# Patient Record
Sex: Male | Born: 1950 | Race: White | Hispanic: No | Marital: Married | Smoking: Never smoker
Health system: Southern US, Community
[De-identification: ages and names within clinical notes are randomized; demographics above are authoritative.]

## PROBLEM LIST (undated history)

## (undated) DIAGNOSIS — K9041 Non-celiac gluten sensitivity: Secondary | ICD-10-CM

## (undated) DIAGNOSIS — Z87820 Personal history of traumatic brain injury: Secondary | ICD-10-CM

## (undated) DIAGNOSIS — K5790 Diverticulosis of intestine, part unspecified, without perforation or abscess without bleeding: Secondary | ICD-10-CM

## (undated) DIAGNOSIS — Z9989 Dependence on other enabling machines and devices: Secondary | ICD-10-CM

## (undated) DIAGNOSIS — I7121 Aneurysm of the ascending aorta, without rupture: Secondary | ICD-10-CM

## (undated) DIAGNOSIS — I77810 Thoracic aortic ectasia: Secondary | ICD-10-CM

## (undated) DIAGNOSIS — Z973 Presence of spectacles and contact lenses: Secondary | ICD-10-CM

## (undated) DIAGNOSIS — G8929 Other chronic pain: Secondary | ICD-10-CM

## (undated) DIAGNOSIS — R200 Anesthesia of skin: Secondary | ICD-10-CM

## (undated) DIAGNOSIS — Q8740 Marfan's syndrome, unspecified: Secondary | ICD-10-CM

## (undated) DIAGNOSIS — K219 Gastro-esophageal reflux disease without esophagitis: Secondary | ICD-10-CM

## (undated) DIAGNOSIS — M19039 Primary osteoarthritis, unspecified wrist: Secondary | ICD-10-CM

## (undated) DIAGNOSIS — R7611 Nonspecific reaction to tuberculin skin test without active tuberculosis: Secondary | ICD-10-CM

## (undated) DIAGNOSIS — Z8701 Personal history of pneumonia (recurrent): Secondary | ICD-10-CM

## (undated) DIAGNOSIS — M25531 Pain in right wrist: Secondary | ICD-10-CM

## (undated) DIAGNOSIS — Z8279 Family history of other congenital malformations, deformations and chromosomal abnormalities: Secondary | ICD-10-CM

## (undated) DIAGNOSIS — G629 Polyneuropathy, unspecified: Secondary | ICD-10-CM

## (undated) DIAGNOSIS — Z889 Allergy status to unspecified drugs, medicaments and biological substances status: Secondary | ICD-10-CM

## (undated) DIAGNOSIS — K449 Diaphragmatic hernia without obstruction or gangrene: Secondary | ICD-10-CM

## (undated) DIAGNOSIS — M5410 Radiculopathy, site unspecified: Secondary | ICD-10-CM

## (undated) DIAGNOSIS — I714 Abdominal aortic aneurysm, without rupture: Secondary | ICD-10-CM

## (undated) DIAGNOSIS — G4730 Sleep apnea, unspecified: Secondary | ICD-10-CM

## (undated) DIAGNOSIS — G510 Bell's palsy: Secondary | ICD-10-CM

## (undated) DIAGNOSIS — J3089 Other allergic rhinitis: Secondary | ICD-10-CM

## (undated) DIAGNOSIS — R202 Paresthesia of skin: Secondary | ICD-10-CM

## (undated) DIAGNOSIS — I959 Hypotension, unspecified: Secondary | ICD-10-CM

## (undated) DIAGNOSIS — B009 Herpesviral infection, unspecified: Secondary | ICD-10-CM

## (undated) DIAGNOSIS — N529 Male erectile dysfunction, unspecified: Secondary | ICD-10-CM

## (undated) DIAGNOSIS — Z9289 Personal history of other medical treatment: Secondary | ICD-10-CM

## (undated) DIAGNOSIS — N401 Enlarged prostate with lower urinary tract symptoms: Secondary | ICD-10-CM

## (undated) DIAGNOSIS — H21239 Degeneration of iris (pigmentary), unspecified eye: Secondary | ICD-10-CM

## (undated) DIAGNOSIS — R2991 Unspecified symptoms and signs involving the musculoskeletal system: Secondary | ICD-10-CM

## (undated) DIAGNOSIS — R918 Other nonspecific abnormal finding of lung field: Secondary | ICD-10-CM

## (undated) DIAGNOSIS — I2510 Atherosclerotic heart disease of native coronary artery without angina pectoris: Secondary | ICD-10-CM

## (undated) DIAGNOSIS — G4733 Obstructive sleep apnea (adult) (pediatric): Secondary | ICD-10-CM

## (undated) DIAGNOSIS — I7789 Other specified disorders of arteries and arterioles: Secondary | ICD-10-CM

## (undated) DIAGNOSIS — E7800 Pure hypercholesterolemia, unspecified: Secondary | ICD-10-CM

## (undated) DIAGNOSIS — I712 Thoracic aortic aneurysm, without rupture: Secondary | ICD-10-CM

## (undated) DIAGNOSIS — N3943 Post-void dribbling: Secondary | ICD-10-CM

## (undated) DIAGNOSIS — F329 Major depressive disorder, single episode, unspecified: Secondary | ICD-10-CM

## (undated) HISTORY — DX: Male erectile dysfunction, unspecified: N52.9

## (undated) HISTORY — DX: Hypotension, unspecified: I95.9

## (undated) HISTORY — DX: Gastro-esophageal reflux disease without esophagitis: K21.9

## (undated) HISTORY — DX: Polyneuropathy, unspecified: G62.9

## (undated) HISTORY — DX: Major depressive disorder, single episode, unspecified: F32.9

## (undated) HISTORY — DX: Thoracic aortic aneurysm, without rupture: I71.2

## (undated) HISTORY — DX: Marfan's syndrome, unspecified: Q87.40

## (undated) HISTORY — DX: Nonspecific reaction to tuberculin skin test without active tuberculosis: R76.11

## (undated) HISTORY — DX: Non-celiac gluten sensitivity: K90.41

## (undated) HISTORY — DX: Other specified disorders of arteries and arterioles: I77.89

## (undated) HISTORY — DX: Diverticulosis of intestine, part unspecified, without perforation or abscess without bleeding: K57.90

## (undated) HISTORY — DX: Obstructive sleep apnea (adult) (pediatric): G47.33

## (undated) HISTORY — DX: Benign prostatic hyperplasia with lower urinary tract symptoms: N40.1

## (undated) HISTORY — DX: Aneurysm of the ascending aorta, without rupture: I71.21

## (undated) HISTORY — DX: Primary osteoarthritis, unspecified wrist: M19.039

## (undated) HISTORY — DX: Degeneration of iris (pigmentary), unspecified eye: H21.239

## (undated) HISTORY — DX: Obstructive sleep apnea (adult) (pediatric): Z99.89

## (undated) HISTORY — DX: Herpesviral infection, unspecified: B00.9

## (undated) HISTORY — PX: OTHER SURGICAL HISTORY: SHX169

## (undated) HISTORY — DX: Radiculopathy, site unspecified: M54.10

## (undated) HISTORY — DX: Sleep apnea, unspecified: G47.30

## (undated) HISTORY — DX: Allergy status to unspecified drugs, medicaments and biological substances status: Z88.9

## (undated) HISTORY — DX: Pure hypercholesterolemia, unspecified: E78.00

## (undated) HISTORY — DX: Personal history of pneumonia (recurrent): Z87.01

## (undated) HISTORY — DX: Other nonspecific abnormal finding of lung field: R91.8

## (undated) HISTORY — DX: Bell's palsy: G51.0

---
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
## 1973-12-25 HISTORY — PX: NASAL SEPTUM SURGERY: SHX37

## 1973-12-25 HISTORY — PX: WISDOM TOOTH EXTRACTION: SHX21

## 1976-12-25 HISTORY — PX: INGUINAL HERNIA REPAIR: SUR1180

## 1993-12-25 HISTORY — PX: FINGER EXPLORATION: SHX1635

## 1993-12-25 HISTORY — PX: DIGITAL NERVE REPAIR: SHX1458

## 2008-12-25 HISTORY — PX: APPENDECTOMY: SHX54

## 2008-12-25 HISTORY — PX: LAPAROSCOPIC APPENDECTOMY: SUR753

## 2011-10-03 ENCOUNTER — Emergency Department (HOSPITAL_COMMUNITY)
Admission: EM | Admit: 2011-10-03 | Discharge: 2011-10-04 | Disposition: A | Discharge status: Home / Self Care | Payer: 59 | Attending: Emergency Medicine | Admitting: Emergency Medicine

## 2011-10-03 DIAGNOSIS — B379 Candidiasis, unspecified: Secondary | ICD-10-CM | POA: Insufficient documentation

## 2011-10-03 DIAGNOSIS — R21 Rash and other nonspecific skin eruption: Secondary | ICD-10-CM | POA: Insufficient documentation

## 2011-10-04 LAB — GLUCOSE, CAPILLARY: Glucose-Capillary: 95 mg/dL (ref 70–99)

## 2011-11-21 ENCOUNTER — Ambulatory Visit (INDEPENDENT_AMBULATORY_CARE_PROVIDER_SITE_OTHER): Payer: 59 | Admitting: Internal Medicine

## 2011-11-21 ENCOUNTER — Encounter: Payer: Self-pay | Admitting: Internal Medicine

## 2011-11-21 DIAGNOSIS — R918 Other nonspecific abnormal finding of lung field: Secondary | ICD-10-CM

## 2011-11-21 DIAGNOSIS — J305 Allergic rhinitis due to food: Secondary | ICD-10-CM

## 2011-11-21 DIAGNOSIS — R9389 Abnormal findings on diagnostic imaging of other specified body structures: Secondary | ICD-10-CM

## 2011-11-21 DIAGNOSIS — R7611 Nonspecific reaction to tuberculin skin test without active tuberculosis: Secondary | ICD-10-CM

## 2011-11-21 DIAGNOSIS — J301 Allergic rhinitis due to pollen: Secondary | ICD-10-CM

## 2011-11-21 DIAGNOSIS — G4733 Obstructive sleep apnea (adult) (pediatric): Secondary | ICD-10-CM

## 2011-11-21 NOTE — Progress Notes (Signed)
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 He reports a chronic history of allergic rhinitis, with allergy skin testing broadly positive in the past but never allergy vaccine. He moved here from [place] this year. There, he was adequately controlled with a steroid nasal spray, Mucinex and Zyrtec D. Symptoms worse in spring and fall. In [place] he is needing to have plain Zyrtec as an extra dose in the evenings. He wants to avoid allergy shots. He notices primarily rhinorrhea with less congestion and no wheeze or cough and no rash. There is no history of intolerance to latex, contrast, aspirin. Food allergies have been suspected, especially seasonings and cotton seed oil.Marland Kitchen He has not had insect stings. He has felt worse on work days and he says there has been obvious black mold in his building. There is a history of major renovation of abdomen because of mold and dampness problems. He is currently living in an apartment with some carpet and mostly vinyl floor. He denies problems there. He has no pets but did have a cath in the past. He reports intense local reaction to flu vaccine on more than one trial in the past. History of obstructive sleep apnea treated with BiPAP 11/6. We asked him to track down his original diagnostic report for our file. He never smoked, but chest x-ray in the last year showed overinflation and was interpreted as "COPD". He denies respiratory symptoms except as described above and wondered if the BiPAP caused overinflation. He is interested in evaluating with PFT.  ROS-see HPI Constitutional:   No-   weight loss, night sweats, fevers, chills, fatigue, lassitude. HEENT:   No-  headaches, difficulty swallowing, tooth/dental problems, sore throat,       No-  sneezing, itching, ear ache, nasal congestion,+ post nasal drip,  CV:  No-   chest pain, orthopnea, PND, swelling in lower extremities, anasarca,                                   dizziness, palpitations Resp: No-   shortness of breath with exertion or at rest.              No-   productive cough,  No non-productive cough,  No- coughing up of blood.              No-   change in color of mucus.  No- wheezing.   Skin: No-   rash or lesions. GI:  No-   heartburn, indigestion, abdominal pain, nausea, vomiting, diarrhea,                 change in bowel habits, loss of appetite GU: No-   dysuria, change in color of urine, no urgency or frequency.  No- flank pain. MS:  No-   joint pain or swelling.  No- decreased range of motion.  No- back pain. Neuro-     nothing unusual Psych:  No- change in mood or affect. No depression or anxiety.  No memory loss.  OBJ General- Alert, Oriented, Affect-appropriate, Distress- none acute; tall slender man Skin- rash-none, lesions- none, excoriation- none Lymphadenopathy- none Head- atraumatic            Eyes- Gross vision intact, PERRLA, conjunctivae clear secretions            Ears- Hearing, canals-normal            Nose-  narrow with mucus bridging, +Septal dev and external nose (hx fx), mucus, No-polyps, erosion, perforation             Throat- Mallampati II , mucosa clear , drainage- none, tonsils- atrophic Neck- flexible , trachea midline, no stridor , thyroid nl, carotid no bruit Chest - symmetrical excursion , unlabored           Heart/CV- RRR , no murmur , no gallop  , no rub, nl s1 s2                           - JVD- none , edema- none, stasis changes- none, varices- none           Lung- clear to P&A, wheeze- none, cough- none , dullness-none, rub- none           Chest wall-  Abd- tender-no, distended-no, bowel sounds-present, HSM- no Br/ Gen/ Rectal- Not done, not indicated Extrem- cyanosis- none, clubbing, none, atrophy- none, strength- nl Neuro- grossly intact to observation

## 2011-11-21 NOTE — Patient Instructions (Signed)
Sample trial Patanase nasal spray--  1 or 2 puffs each nostril, up to twice daily as needed----antihistamine  Please see if you can get the disk/ report from your latest CXR and also an old diagnostic sleep study if possible, for our files  I recommend that you do not take the TIA flu shot. I think it would be best to avoid flu vaccines and be prepared to treat early with Tamiflu as needed.  Consider trying HEPA  Air filters for bedroom and your office at work.

## 2011-11-22 ENCOUNTER — Encounter: Payer: Self-pay | Admitting: Internal Medicine

## 2011-11-22 DIAGNOSIS — J305 Allergic rhinitis due to food: Secondary | ICD-10-CM | POA: Insufficient documentation

## 2011-11-22 DIAGNOSIS — G4733 Obstructive sleep apnea (adult) (pediatric): Secondary | ICD-10-CM | POA: Insufficient documentation

## 2011-11-22 DIAGNOSIS — J301 Allergic rhinitis due to pollen: Secondary | ICD-10-CM | POA: Insufficient documentation

## 2011-11-22 DIAGNOSIS — R7611 Nonspecific reaction to tuberculin skin test without active tuberculosis: Secondary | ICD-10-CM | POA: Insufficient documentation

## 2011-11-22 DIAGNOSIS — R9389 Abnormal findings on diagnostic imaging of other specified body structures: Secondary | ICD-10-CM | POA: Insufficient documentation

## 2011-11-22 NOTE — Assessment & Plan Note (Signed)
He is going to try to get his original diagnostic test for his file. We discussed the need for documentation if he needs replacement equipment or reevaluation later.

## 2011-11-22 NOTE — Assessment & Plan Note (Signed)
He understands to avoid foods that cause him problems. He is describing only rhinorrhea without asthma or anaphylaxis.

## 2011-11-22 NOTE — Assessment & Plan Note (Addendum)
We can choose to assess with allergy profile as needed. We reemphasized environmental controls, most of which he already knows. I recommended he consider HEPA filters in his work area and home. We can continue nasal steroid and we'll try adding a nasal antihistamine spray.   He describes strong local reaction to flu vaccine on multiple occasions when tried in the past. We discussed Tamiflu. The live nasal vaccine is indicated for age 59 and younger.

## 2011-11-22 NOTE — Assessment & Plan Note (Signed)
He says his most recent chest x-ray was read as showing overinflation and he questioned whether BiPAP was responsible. He denies any dyspnea or cough and has no history of lung disease or smoking. Plan-schedule PFTs.  History of aortic root at the upper limit of normal size. I did not hear a murmur. Seeing him as tall and thin, consider possible Marfan's.

## 2011-11-22 NOTE — Assessment & Plan Note (Signed)
No suspicion of active tuberculosis.

## 2011-11-30 ENCOUNTER — Telehealth: Payer: Self-pay | Admitting: Critical Care Medicine

## 2011-11-30 NOTE — Telephone Encounter (Signed)
lmomtcb x1 

## 2011-12-01 NOTE — Telephone Encounter (Signed)
First we would have to get current allergy testing- either IgE profile, or skin testing off antihistamines (preferred). I would look at that before knowing whether a trial of allergy vaccine would be worthwhile.

## 2011-12-01 NOTE — Telephone Encounter (Signed)
Spoke with pt. He states that he is interested in started allergy shots, and wants to know if he does this, will he have to come here every week? Or how often will need shot? He states although he works someone very close to our office, it would be hard to get here every week and wants to know if it could be coordinated for him to get his shot at his work [employer]). Please advise thanks!

## 2011-12-01 NOTE — Telephone Encounter (Signed)
Spoke with pt and notified of recs per Dr Maple Hudson. He verbalized understanding and states that he will come in for skin testing- I have scheduled him for the first available-02/28/12 at 2 pm and advised that he should be off all antihistamines and otc cold meds at least 3 days prior to this. Pt verbalized understanding.

## 2011-12-22 ENCOUNTER — Telehealth: Payer: Self-pay | Admitting: Internal Medicine

## 2011-12-22 MED ORDER — OLOPATADINE HCL 0.6 % NA SOLN: NASAL | Status: DC

## 2011-12-22 NOTE — Telephone Encounter (Signed)
Per 11.27.12 ov note:  Patient Instructions     Sample trial Patanase nasal spray-- 1 or 2 puffs each nostril, up to twice daily as needed----antihistamine   Dr Maple Hudson please advise if pt may have rx and how many refills, thanks!  Patient also requesting to have his allergy testing moved up from March.  The January and Feb dates are booked, but please advise if there will be/can be a work-in date, thanks!

## 2011-12-22 NOTE — Telephone Encounter (Signed)
Per CY----ok to send in rx for the patanase  1-2 puff each nostril bid prn.  We have no other dates avaliable for an earlier testing date.  Pt can cont to call to see if any cancellations.  thanks

## 2011-12-22 NOTE — Telephone Encounter (Signed)
I spoke with pt and is aware rx for patanase sent to [facility] pharmacy. Pt aware no earlier dates for testing and can call back to see if any cancellations. Pt voiced his understanding

## 2012-02-28 ENCOUNTER — Ambulatory Visit (INDEPENDENT_AMBULATORY_CARE_PROVIDER_SITE_OTHER): Payer: 59 | Admitting: Internal Medicine

## 2012-02-28 ENCOUNTER — Encounter: Payer: Self-pay | Admitting: Internal Medicine

## 2012-02-28 VITALS — BP 116/86 | HR 82 | Ht 76.0 in | Wt 196.2 lb

## 2012-02-28 DIAGNOSIS — J301 Allergic rhinitis due to pollen: Secondary | ICD-10-CM

## 2012-02-28 DIAGNOSIS — R9389 Abnormal findings on diagnostic imaging of other specified body structures: Secondary | ICD-10-CM

## 2012-02-28 DIAGNOSIS — G4733 Obstructive sleep apnea (adult) (pediatric): Secondary | ICD-10-CM

## 2012-02-28 DIAGNOSIS — J305 Allergic rhinitis due to food: Secondary | ICD-10-CM

## 2012-02-28 DIAGNOSIS — R918 Other nonspecific abnormal finding of lung field: Secondary | ICD-10-CM

## 2012-02-28 DIAGNOSIS — R0600 Dyspnea, unspecified: Secondary | ICD-10-CM

## 2012-02-28 DIAGNOSIS — R0609 Other forms of dyspnea: Secondary | ICD-10-CM

## 2012-02-28 NOTE — Patient Instructions (Addendum)
Order- schedule PFT  Dx dyspnea  The allergy lab will be calling you when your vaccine is ready to start.  Consider calling Dr Althea Grimmer, DDS   Orthodontist in [place], if you would like to know more about oral appliances as a treatment for sleep apnea.  Please call as needed

## 2012-02-28 NOTE — Progress Notes (Signed)
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 He reports a chronic history of allergic rhinitis, with allergy skin testing broadly positive in the past but never allergy vaccine. He moved here from [place] this year. There, he was adequately controlled with a steroid nasal spray, Mucinex and Zyrtec D. Symptoms worse in spring and fall. In [place] he is needing to have plain Zyrtec as an extra dose in the evenings. He wants to avoid allergy shots. He notices primarily rhinorrhea with less congestion and no wheeze or cough and no rash. There is no history of intolerance to latex, contrast, aspirin. Food allergies have been suspected, especially seasonings and cotton seed oil.Marland Kitchen He has not had insect stings. He has felt worse on work days and he says there has been obvious black mold in his building. There is a history of major renovation of abdomen because of mold and dampness problems. He is currently living in an apartment with some carpet and mostly vinyl floor. He denies problems there. He has no pets but did have a cath in the past. He reports intense local reaction to flu vaccine on more than one trial in the past. History of obstructive sleep apnea treated with BiPAP 11/6. We asked him to track down his original diagnostic report for our file. He never smoked, but chest x-ray in the last year showed overinflation and was interpreted as "COPD". He denies respiratory symptoms except as described above and wondered if the BiPAP caused overinflation. He is interested in evaluating with PFT.  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 He reports a chronic history of allergic rhinitis, with allergy skin testing broadly positive in the past but never allergy vaccine. Blames Soy for abd cramps, gas. Did have Nasal flu vaccine last Fall. Has not procured his old sleep study from [place].    Tested Normal in past for a1AT. Using BIPAP 11/6 regularly, based on testing in [place], but suspects the pressure may explain why his CXR's are read as over-inflated "?emphysema?" in recent years. A1AT tested normal. We discussed oral appliances as alternative to BiPAP. Significant increase in nasal congestion and nose blowing since off antihistamines for skin testing.  Allergy skin testing- broadly positive reactions for grass weed and tree pollens, dust mite, feathers and 1 mold. Isolated positive reaction on food testing for beef. He will watch this without restriction.  He wants allergy vaccine.  ROS-see HPI Constitutional:   No-   weight loss, night sweats, fevers, chills, fatigue, lassitude. HEENT:   No-  headaches, difficulty swallowing, tooth/dental problems, sore throat,      + sneezing, itching, ear ache, nasal congestion,+ post nasal drip,  CV:  No-   chest pain, orthopnea, PND, swelling in lower extremities, anasarca, dizziness, palpitations Resp: No-   shortness of breath with exertion or at rest.              No-   productive cough,  No non-productive cough,  No- coughing up of blood.              No-   change in color of mucus.  No- wheezing.   Skin: No-   rash or lesions. GI:  No-   heartburn, indigestion, abdominal pain, nausea, vomiting, GU: . MS:  No-   joint pain or swelling.   Neuro-     nothing unusual Psych:  No- change in mood or affect. No depression or  anxiety.  No memory loss.  OBJ General- Alert, Oriented, Affect-appropriate, Distress- none acute; tall slender man Skin- rash-none, lesions- none, excoriation- none Lymphadenopathy- none Head- atraumatic            Eyes- Gross vision intact, PERRLA, conjunctivae clear secretions            Ears- Hearing, canals-normal            Nose- narrow with mucus bridging, +Septal dev and external nose (hx fx), mucus, No-polyps, erosion, perforation. There is blood crust on right septal wall            Throat- Mallampati II  , mucosa clear , drainage- none, tonsils- atrophic Neck- flexible , trachea midline, no stridor , thyroid nl, carotid no bruit Chest - symmetrical excursion , unlabored           Heart/CV- RRR , no murmur , no gallop  , no rub, nl s1 s2                           - JVD- none , edema- none, stasis changes- none, varices- none           Lung- clear to P&A, wheeze- none, cough- none , dullness-none, rub- none           Chest wall-  Abd-  Br/ Gen/ Rectal- Not done, not indicated Extrem- cyanosis- none, clubbing, none, atrophy- none, strength- nl Neuro- grossly intact to observation

## 2012-03-02 NOTE — Assessment & Plan Note (Signed)
Question of emphysema has been raised several times because of his radiologic appearance. Normal alpha-1 antitrypsin level. I suspect the x-ray pattern reflects his tall thin body habitus. With history of borderline aortic caliber, consider Marfan's. Plan-PFT

## 2012-03-02 NOTE — Assessment & Plan Note (Addendum)
Good compliance and control. We have asked him to help [place] get his original diagnostic sleep study from [place]. He wanted to know about alternatives. We discussed oral appliances as an option.

## 2012-03-02 NOTE — Assessment & Plan Note (Signed)
Skin test findings are consistent with his past history, physical exam. We have reviewed available treatments and environmental precautions. We have specifically discussed allergy vaccine including realistic subcutaneous anaphylaxis, variable success and long term treatment strategy. He wishes to start allergy vaccine. Plan-start allergy vaccine here.

## 2012-03-05 ENCOUNTER — Ambulatory Visit (INDEPENDENT_AMBULATORY_CARE_PROVIDER_SITE_OTHER): Payer: 59

## 2012-03-05 DIAGNOSIS — J309 Allergic rhinitis, unspecified: Secondary | ICD-10-CM

## 2012-03-07 ENCOUNTER — Encounter: Payer: Self-pay | Admitting: Internal Medicine

## 2012-03-12 ENCOUNTER — Ambulatory Visit (INDEPENDENT_AMBULATORY_CARE_PROVIDER_SITE_OTHER): Payer: 59

## 2012-03-12 DIAGNOSIS — J309 Allergic rhinitis, unspecified: Secondary | ICD-10-CM

## 2012-03-15 ENCOUNTER — Ambulatory Visit (INDEPENDENT_AMBULATORY_CARE_PROVIDER_SITE_OTHER): Payer: 59

## 2012-03-15 DIAGNOSIS — J309 Allergic rhinitis, unspecified: Secondary | ICD-10-CM

## 2012-03-19 ENCOUNTER — Ambulatory Visit (INDEPENDENT_AMBULATORY_CARE_PROVIDER_SITE_OTHER): Payer: 59

## 2012-03-19 ENCOUNTER — Ambulatory Visit (INDEPENDENT_AMBULATORY_CARE_PROVIDER_SITE_OTHER): Payer: 59 | Admitting: Internal Medicine

## 2012-03-19 DIAGNOSIS — R918 Other nonspecific abnormal finding of lung field: Secondary | ICD-10-CM

## 2012-03-19 DIAGNOSIS — J309 Allergic rhinitis, unspecified: Secondary | ICD-10-CM

## 2012-03-19 DIAGNOSIS — R9389 Abnormal findings on diagnostic imaging of other specified body structures: Secondary | ICD-10-CM

## 2012-03-19 LAB — PULMONARY FUNCTION TEST

## 2012-03-19 NOTE — Progress Notes (Signed)
PFT done today. 

## 2012-03-21 ENCOUNTER — Ambulatory Visit (INDEPENDENT_AMBULATORY_CARE_PROVIDER_SITE_OTHER): Payer: 59

## 2012-03-21 DIAGNOSIS — J309 Allergic rhinitis, unspecified: Secondary | ICD-10-CM

## 2012-03-27 ENCOUNTER — Encounter: Payer: Self-pay | Admitting: Internal Medicine

## 2012-03-29 ENCOUNTER — Ambulatory Visit (INDEPENDENT_AMBULATORY_CARE_PROVIDER_SITE_OTHER): Payer: 59

## 2012-03-29 DIAGNOSIS — J309 Allergic rhinitis, unspecified: Secondary | ICD-10-CM

## 2012-04-02 ENCOUNTER — Telehealth: Payer: Self-pay | Admitting: Internal Medicine

## 2012-04-02 ENCOUNTER — Ambulatory Visit (INDEPENDENT_AMBULATORY_CARE_PROVIDER_SITE_OTHER): Payer: 59

## 2012-04-02 DIAGNOSIS — J309 Allergic rhinitis, unspecified: Secondary | ICD-10-CM

## 2012-04-05 ENCOUNTER — Ambulatory Visit (INDEPENDENT_AMBULATORY_CARE_PROVIDER_SITE_OTHER): Payer: 59

## 2012-04-05 DIAGNOSIS — J309 Allergic rhinitis, unspecified: Secondary | ICD-10-CM

## 2012-04-08 ENCOUNTER — Telehealth: Payer: Self-pay | Admitting: Internal Medicine

## 2012-04-08 NOTE — Telephone Encounter (Signed)
LMTCB-need to know what pharmacy pt uses.

## 2012-04-08 NOTE — Telephone Encounter (Signed)
LMTCB-need to know when patient sent sleep study and which fax number he sent it to.

## 2012-04-08 NOTE — Telephone Encounter (Signed)
[name] please advise if you have seen these sleep study results for this pt.  thanks

## 2012-04-09 NOTE — Telephone Encounter (Signed)
LMOMTCB

## 2012-04-09 NOTE — Telephone Encounter (Signed)
LMOMTCB with pharmacy name

## 2012-04-10 NOTE — Telephone Encounter (Signed)
LMOMTCB x 1 

## 2012-04-10 NOTE — Telephone Encounter (Signed)
LMOMTCB for pt  

## 2012-04-11 ENCOUNTER — Ambulatory Visit (INDEPENDENT_AMBULATORY_CARE_PROVIDER_SITE_OTHER): Payer: 59

## 2012-04-11 ENCOUNTER — Telehealth: Payer: Self-pay | Admitting: Internal Medicine

## 2012-04-11 DIAGNOSIS — J309 Allergic rhinitis, unspecified: Secondary | ICD-10-CM

## 2012-04-11 MED ORDER — CETIRIZINE HCL 10 MG PO TABS: 10.0000 mg | ORAL_TABLET | Freq: Every evening | ORAL | Status: DC

## 2012-04-11 NOTE — Telephone Encounter (Signed)
LMOMTCB for pt  

## 2012-04-11 NOTE — Telephone Encounter (Signed)
RX sent to [facility] for 30 day supply. Pt has been notified.

## 2012-04-11 NOTE — Telephone Encounter (Signed)
Error.Patrick Moyer ° °

## 2012-04-11 NOTE — Telephone Encounter (Signed)
Received copies from [facility],on 04/11/12 . Forwarded  15 pages to [facility] ,for review.

## 2012-04-11 NOTE — Telephone Encounter (Signed)
[facility].Patrick Moyer

## 2012-04-12 NOTE — Telephone Encounter (Signed)
Lm advising we have tried to reach him multiple times w/o response and if anything further was needed then to call back. Will sign off message per triage protocol

## 2012-04-12 NOTE — Telephone Encounter (Signed)
Pt returned call, informed him that we had called to find out what fax number he had his sleep study sent to (there are multiple messages from pt).  However, it is evident in pt's chart that medical records received 15 pages from [facility] and pt confirmed that the sleep study and CT that he wanted CDY to review are in those records.  Per pt's chart, these have been forwarded to CD[facility].  Patient has a few requests: 1- the information that we send to Dr Myrtis Ser DDS, pt would like the same information sent to Dr Alma Friendly DDS as well.  No documentation in pt's chart of any orders/referrals sent.  Dr Maple Hudson, can you advise?  Thanks. 2- pt states that though he works at [employer], it is difficult for him to get to our office for his allergy shots.  Would like to know if he may have a nurse administer this? 3- pt also states that when we call him, the number comes up as "unknown."  Would like our office to either call him at approx 3pm or dial *[phone] to remove the block.  Informed patient that our department is unaware of any blocks placed on our phones but that we will attempt to call him at a more convenient time in response to this message.  Pt is aware CDY is not in the office today and is okay with a return call on Monday.

## 2012-04-16 ENCOUNTER — Ambulatory Visit (INDEPENDENT_AMBULATORY_CARE_PROVIDER_SITE_OTHER): Payer: 59

## 2012-04-16 DIAGNOSIS — J309 Allergic rhinitis, unspecified: Secondary | ICD-10-CM

## 2012-04-24 ENCOUNTER — Ambulatory Visit (INDEPENDENT_AMBULATORY_CARE_PROVIDER_SITE_OTHER): Payer: 59

## 2012-04-24 DIAGNOSIS — J309 Allergic rhinitis, unspecified: Secondary | ICD-10-CM

## 2012-04-30 ENCOUNTER — Ambulatory Visit (INDEPENDENT_AMBULATORY_CARE_PROVIDER_SITE_OTHER): Payer: 59 | Admitting: Internal Medicine

## 2012-04-30 ENCOUNTER — Ambulatory Visit (INDEPENDENT_AMBULATORY_CARE_PROVIDER_SITE_OTHER): Payer: 59

## 2012-04-30 ENCOUNTER — Encounter: Payer: Self-pay | Admitting: Internal Medicine

## 2012-04-30 VITALS — BP 110/70 | HR 70 | Ht 76.0 in | Wt 199.8 lb

## 2012-04-30 DIAGNOSIS — R9389 Abnormal findings on diagnostic imaging of other specified body structures: Secondary | ICD-10-CM

## 2012-04-30 DIAGNOSIS — R7611 Nonspecific reaction to tuberculin skin test without active tuberculosis: Secondary | ICD-10-CM

## 2012-04-30 DIAGNOSIS — K909 Intestinal malabsorption, unspecified: Secondary | ICD-10-CM

## 2012-04-30 DIAGNOSIS — R918 Other nonspecific abnormal finding of lung field: Secondary | ICD-10-CM

## 2012-04-30 DIAGNOSIS — J309 Allergic rhinitis, unspecified: Secondary | ICD-10-CM

## 2012-04-30 DIAGNOSIS — G4733 Obstructive sleep apnea (adult) (pediatric): Secondary | ICD-10-CM

## 2012-04-30 DIAGNOSIS — J301 Allergic rhinitis due to pollen: Secondary | ICD-10-CM

## 2012-04-30 NOTE — Progress Notes (Signed)
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 He reports a chronic history of allergic rhinitis, with allergy skin testing broadly positive in the past but never allergy vaccine. He moved here from [place] this year. There, he was adequately controlled with a steroid nasal spray, Mucinex and Zyrtec D. Symptoms worse in spring and fall. In [place] he is needing to have plain Zyrtec as an extra dose in the evenings. He wants to avoid allergy shots. He notices primarily rhinorrhea with less congestion and no wheeze or cough and no rash. There is no history of intolerance to latex, contrast, aspirin. Food allergies have been suspected, especially seasonings and cotton seed oil.Marland Kitchen He has not had insect stings. He has felt worse on work days and he says there has been obvious black mold in his building. There is a history of major renovation of abdomen because of mold and dampness problems. He is currently living in an apartment with some carpet and mostly vinyl floor. He denies problems there. He has no pets but did have a cath in the past. He reports intense local reaction to flu vaccine on more than one trial in the past. History of obstructive sleep apnea treated with BiPAP 11/6. We asked him to track down his original diagnostic report for our file. He never smoked, but chest x-ray in the last year showed overinflation and was interpreted as "COPD". He denies respiratory symptoms except as described above and wondered if the BiPAP caused overinflation. He is interested in evaluating with PFT.  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 He reports a chronic history of allergic rhinitis, with allergy skin testing broadly positive in the past but never allergy vaccine. Blames Soy for abd cramps, gas. Did have Nasal flu vaccine last Fall. Has not procured his old sleep study from [place].    Tested Normal in past for a1AT. Using BIPAP 11/6 regularly, based on testing in [place], but suspects the pressure may explain why his CXR's are read as over-inflated "?emphysema?" in recent years. A1AT tested normal. We discussed oral appliances as alternative to BiPAP. Significant increase in nasal congestion and nose blowing since off antihistamines for skin testing.  Allergy skin testing- broadly positive reactions for grass weed and tree pollens, dust mite, feathers and 1 mold. Isolated positive reaction on food testing for beef. He will watch this without restriction.  He wants allergy vaccine.  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 Using BiPAP 11/5 Advanced all night every night. His dentist, [facility], is starting to make oral appliances. Spring pollen has been unusually well tolerated and he credits his allergy vaccine which is still building. He has used Patanase and Veramyst nasal sprays with Zyrtec and Mucinex. He would like his nurse retrained to get his allergy shots so that he doesn't have to take time to come here and we discussed this. We reviewed previous imaging reports: Chest CT at [facility] 02/22/2010: Hyperinflation interpreted as chronic changes of COPD. Small lymph nodes including 5 mm noncalcified right middle lobe, 4 mm noncalcified right middle lobe, subpleural nodular densities in the apices. 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 Never smoked. PFT: 03/19/2012-scores are high normal. FEV1 4.96/129%, FEV1/FVC 0.81. FEF 25-75% 5.07/148% with 23% response to bronchodilator.  TLC 119%, RV 113%, DLCO 130%. These indicate large lungs without the hyperinflation of emphysema suggested by radiologist interpretation of images.  ROS-see HPI Constitutional:   No-   weight loss, night sweats, fevers,  chills, fatigue, lassitude. HEENT:   No-  headaches, difficulty swallowing, tooth/dental problems, sore throat,      + sneezing, itching, ear ache, nasal congestion,+ post nasal drip,  CV:  No-   chest pain, orthopnea, PND, swelling in lower extremities, anasarca, dizziness, palpitations Resp: No-   shortness of breath with exertion or at rest.              No-   productive cough,  No non-productive cough,  No- coughing up of blood.              No-   change in color of mucus.  No- wheezing.   Skin: No-   rash or lesions. GI:  No-   heartburn, indigestion, abdominal pain, nausea, vomiting, GU: . MS:  No-   joint pain or swelling.   Neuro-     nothing unusual Psych:  No- change in mood or affect. No depression or anxiety.  No memory loss.  OBJ General- Alert, Oriented, Affect-appropriate, Distress- none acute; tall slender man Skin- rash-none, lesions- none, excoriation- none Lymphadenopathy- none Head- atraumatic            Eyes- Gross vision intact, PERRLA, conjunctivae clear secretions            Ears- Hearing, canals-normal            Nose- narrow with mucus bridging, +Septal dev and external nose (hx fx), mucus, No-polyps, erosion, perforation.             Throat- Mallampati II , mucosa clear , drainage- none, tonsils- atrophic Neck- flexible , trachea midline, no stridor , thyroid nl, carotid no bruit Chest - symmetrical excursion , unlabored           Heart/CV- RRR , no murmur , no gallop  , no rub, nl s1 s2                           - JVD- none , edema- none, stasis changes- none, varices- none           Lung- clear to P&A, wheeze- none, cough- none , dullness-none, rub- none           Chest wall-  Abd-  Br/ Gen/ Rectal- Not done, not indicated Extrem- cyanosis- none, clubbing, none, atrophy- none, strength- nl Neuro- grossly intact to observation

## 2012-04-30 NOTE — Patient Instructions (Signed)
Sample Dymista nasal spray-- 2 sprays each nostril once daily at bedtime  Order- refer to GI    Dx malabsorption  You can have your nurse contact us to arrange training to give your allergy shots. We can continue to build your vaccine.

## 2012-05-01 ENCOUNTER — Encounter: Payer: Self-pay | Admitting: Gastroenterology

## 2012-05-01 ENCOUNTER — Encounter: Payer: Self-pay | Admitting: Internal Medicine

## 2012-05-01 DIAGNOSIS — K909 Intestinal malabsorption, unspecified: Secondary | ICD-10-CM | POA: Insufficient documentation

## 2012-05-01 NOTE — Assessment & Plan Note (Signed)
He continues to build allergy vaccine but feels it is helping at this strength already.

## 2012-05-01 NOTE — Assessment & Plan Note (Signed)
Good compliance and control for now. He is interested in switching to an oral appliance to be made by his dentist.

## 2012-05-01 NOTE — Assessment & Plan Note (Signed)
History of gas and bloating. He questions  celiac disease or lactose intolerance. He would like GI referral.

## 2012-05-01 NOTE — Assessment & Plan Note (Signed)
Normal lung function.

## 2012-05-07 ENCOUNTER — Ambulatory Visit (INDEPENDENT_AMBULATORY_CARE_PROVIDER_SITE_OTHER): Payer: 59

## 2012-05-07 DIAGNOSIS — J309 Allergic rhinitis, unspecified: Secondary | ICD-10-CM

## 2012-05-10 ENCOUNTER — Ambulatory Visit (INDEPENDENT_AMBULATORY_CARE_PROVIDER_SITE_OTHER): Payer: 59

## 2012-05-10 DIAGNOSIS — J309 Allergic rhinitis, unspecified: Secondary | ICD-10-CM

## 2012-05-14 ENCOUNTER — Ambulatory Visit (INDEPENDENT_AMBULATORY_CARE_PROVIDER_SITE_OTHER): Payer: 59

## 2012-05-14 DIAGNOSIS — J309 Allergic rhinitis, unspecified: Secondary | ICD-10-CM

## 2012-05-17 ENCOUNTER — Encounter: Payer: Self-pay | Admitting: Internal Medicine

## 2012-05-17 ENCOUNTER — Ambulatory Visit (INDEPENDENT_AMBULATORY_CARE_PROVIDER_SITE_OTHER): Payer: 59

## 2012-05-17 DIAGNOSIS — J309 Allergic rhinitis, unspecified: Secondary | ICD-10-CM

## 2012-05-21 ENCOUNTER — Ambulatory Visit (INDEPENDENT_AMBULATORY_CARE_PROVIDER_SITE_OTHER): Payer: 59 | Admitting: Gastroenterology

## 2012-05-21 ENCOUNTER — Encounter: Payer: Self-pay | Admitting: Gastroenterology

## 2012-05-21 ENCOUNTER — Ambulatory Visit (INDEPENDENT_AMBULATORY_CARE_PROVIDER_SITE_OTHER): Payer: 59

## 2012-05-21 VITALS — BP 90/70 | HR 68 | Ht 76.0 in | Wt 194.4 lb

## 2012-05-21 DIAGNOSIS — Z1211 Encounter for screening for malignant neoplasm of colon: Secondary | ICD-10-CM

## 2012-05-21 DIAGNOSIS — J309 Allergic rhinitis, unspecified: Secondary | ICD-10-CM

## 2012-05-21 DIAGNOSIS — R141 Gas pain: Secondary | ICD-10-CM

## 2012-05-21 DIAGNOSIS — R142 Eructation: Secondary | ICD-10-CM

## 2012-05-21 DIAGNOSIS — R1319 Other dysphagia: Secondary | ICD-10-CM

## 2012-05-21 DIAGNOSIS — K219 Gastro-esophageal reflux disease without esophagitis: Secondary | ICD-10-CM

## 2012-05-21 MED ORDER — ALIGN 4 MG PO CAPS: 1.0000 | ORAL_CAPSULE | Freq: Every day | ORAL | Status: DC

## 2012-05-21 NOTE — Progress Notes (Signed)
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 He notes a long-term significant gluten intolerance with irregular bowel movements and looser stools, so he avoids gluten. He has not had formal testing for celiac disease and there is no family history of celiac disease. He is also lactose intolerant and soy intolerance. He has had a history of intermittent reflux symptoms. He takes omeprazole on a daily basis. He has had intermittent solid food dysphagia and dysphasia with larger pills intermittently for the past 2-3 years. He previously had colonoscopy in 2008 in [place], [place] and he reports the study was normal. Denies weight loss, abdominal pain, constipation, diarrhea, change in stool caliber, melena, hematochezia, nausea, vomiting, chest pain.  Allergies  Allergen Reactions  . Fluconazole In Dextrose Rash  . Other     Adhesive, Caffene-abd pain  . Golytely (Peg 3350-Electrolytes)     Orange flavored   Outpatient Prescriptions Prior to Visit  Medication Sig Dispense Refill  . CELEBREX 200 MG capsule Take 1 capsule by mouth 2 (two) times daily.       . cetirizine (ZYRTEC) 10 MG tablet Take 1 tablet (10 mg total) by mouth every evening.  30 tablet  5  . cetirizine-pseudoephedrine (ZYRTEC-D) 5-120 MG per tablet Take 1 tablet by mouth every morning.        . cholecalciferol (VITAMIN D) 1000 UNITS tablet Take 1,000 Units by mouth 2 (two) times daily.        Marland Kitchen EPIPEN 2-PAK 0.3 MG/0.3ML DEVI       . glucosamine-chondroitin 500-400 MG tablet Take 1 tablet by mouth 2 (two) times daily.        Marland Kitchen guaiFENesin (MUCINEX) 600 MG 12 hr tablet Take 600 mg by mouth 2 (two) times daily.       . Olopatadine HCl (PATANASE) 0.6 % SOLN 1-2 sprays in each nostril twice a day as needed  1 Bottle  5  . Omega-3 Fatty Acids (FISH OIL) 1200 MG CAPS Take 1 capsule by mouth 2 (two) times daily.        . saw palmetto 500  MG capsule Take 500 mg by mouth 2 (two) times daily.        . VERAMYST 27.5 MCG/SPRAY nasal spray Place 2 puffs into the nose daily.       Past Medical History  Diagnosis Date  . Multiple allergies   . Sleep apnea     BIPAP  . Low blood pressure   . Aortic root enlargement     Upper Limits  . 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 ALLERGIC RHINITIS   . Depression   . Diverticulosis   . GERD (gastroesophageal reflux disease)   . Glaucoma   . History of pneumonia   . Pigment dispersion syndrome   . Marfan's syndrome    Past Surgical History  Procedure Date  . Inguinal hernia repair 1978  . Nasal septum surgery 1975    nasal  age 89  . Appendectomy 2010  . Wisdom tooth extraction 1975  . Digital nerve repair 1995   History   Social History  . Marital Status: Married    Spouse Name: N/A    Number of Children: 2  . Years of Education: N/A   Occupational History  .  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 Smoking status: Never Smoker   . Smokeless tobacco: Never Used  . Alcohol Use: No  . Drug Use: No  . Sexually Active: None   Other Topics Concern  . None   Social History Narrative  . None   Family History  Problem Relation Age of Onset  . Aortic aneurysm Father     abdominal  . Ovarian cancer Mother     cysto adenocarcenoma    Review of Systems: Pertinent positive and negative review of systems were noted in the above HPI section. All other review of systems were otherwise negative.  Physical Exam: General: Well developed , well nourished, no acute distress Head: Normocephalic and atraumatic Eyes:  sclerae anicteric, EOMI Ears: Normal auditory acuity Mouth: No deformity or lesions Neck: Supple, no masses or thyromegaly Lungs: Clear throughout to auscultation Heart: Regular rate and rhythm; no murmurs, rubs or bruits Abdomen: Soft, non tender and non  distended. No masses, hepatosplenomegaly or hernias noted. Normal Bowel sounds Musculoskeletal: Symmetrical with no gross deformities  Skin: No lesions on visible extremities Pulses:  Normal pulses noted Extremities: No clubbing, cyanosis, edema or deformities noted Neurological: Alert oriented x 4, grossly nonfocal Cervical Nodes:  No significant cervical adenopathy Inguinal Nodes: No significant inguinal adenopathy Psychological:  Alert and cooperative. Normal mood and affect  Assessment and Recommendations:  1. Solid food dysphagia and chronic GERD. Rule out esophageal stricture. Schedule barium esophagram. If he has a stricture or other significant abnormality will proceed with upper endoscopy and possible dilation. Will consider performing duodenal biopsies given his gluten history.  2. Gas and flatulence. Low gas diet. Trial of probiotics.  3. Lactose intolerance. Avoid lactose and/or use Lactaid.  4. Gluten intolerance. Given that he follows a no gluten diet, antibody testing may be falsely negative. I offered to proceed with a celiac panel and he would like to consider further before proceeding. He will continue to follow gluten free diet.  5. Colorectal cancer screening, average risk. Screening colonoscopy at 10 years, in 2018.

## 2012-05-21 NOTE — Patient Instructions (Addendum)
You have been scheduled for a Barium Esophogram at [facility] (1st floor of the hospital) on 05/23/12 at 1:00pm. Please arrive 15 minutes prior to your appointment for registration. Make certain not to have anything to eat or drink 6 hours prior to your test. If you need to reschedule for any reason, please contact radiology at [phone] to do so. We have given you samples of Align. This puts good bacteria back into your colon. You should take 1 capsule by mouth once daily. If this works well for you, it can be purchased over the counter. Also there is Restora or Florastor over the counter if Align does not help. You have been given a low gas diet.  You will be due for a recall colonoscopy in 04/2017. We will send you a reminder in the mail when it gets closer to that time. cc: Fannie Knee, MD

## 2012-05-22 ENCOUNTER — Ambulatory Visit (INDEPENDENT_AMBULATORY_CARE_PROVIDER_SITE_OTHER): Payer: 59

## 2012-05-22 DIAGNOSIS — J309 Allergic rhinitis, unspecified: Secondary | ICD-10-CM

## 2012-05-23 ENCOUNTER — Ambulatory Visit (HOSPITAL_COMMUNITY)
Admission: RE | Admit: 2012-05-23 | Discharge: 2012-05-23 | Disposition: A | Discharge status: Home / Self Care | Payer: 59 | Source: Ambulatory Visit | Attending: Gastroenterology | Admitting: Gastroenterology

## 2012-05-23 DIAGNOSIS — R1319 Other dysphagia: Secondary | ICD-10-CM

## 2012-05-23 DIAGNOSIS — K449 Diaphragmatic hernia without obstruction or gangrene: Secondary | ICD-10-CM | POA: Insufficient documentation

## 2012-05-23 DIAGNOSIS — R1310 Dysphagia, unspecified: Secondary | ICD-10-CM | POA: Insufficient documentation

## 2012-05-23 DIAGNOSIS — R12 Heartburn: Secondary | ICD-10-CM | POA: Insufficient documentation

## 2012-05-24 ENCOUNTER — Ambulatory Visit (INDEPENDENT_AMBULATORY_CARE_PROVIDER_SITE_OTHER): Payer: 59

## 2012-05-24 DIAGNOSIS — J309 Allergic rhinitis, unspecified: Secondary | ICD-10-CM

## 2012-05-27 ENCOUNTER — Ambulatory Visit (INDEPENDENT_AMBULATORY_CARE_PROVIDER_SITE_OTHER): Payer: 59

## 2012-05-27 DIAGNOSIS — J309 Allergic rhinitis, unspecified: Secondary | ICD-10-CM

## 2012-05-28 ENCOUNTER — Other Ambulatory Visit: Payer: Self-pay

## 2012-05-28 MED ORDER — OMEPRAZOLE MAGNESIUM 20 MG PO TBEC: 40.0000 mg | DELAYED_RELEASE_TABLET | Freq: Every day | ORAL | Status: DC

## 2012-05-31 ENCOUNTER — Ambulatory Visit (INDEPENDENT_AMBULATORY_CARE_PROVIDER_SITE_OTHER): Payer: 59

## 2012-05-31 DIAGNOSIS — J309 Allergic rhinitis, unspecified: Secondary | ICD-10-CM

## 2012-06-03 ENCOUNTER — Ambulatory Visit (INDEPENDENT_AMBULATORY_CARE_PROVIDER_SITE_OTHER): Payer: 59

## 2012-06-03 DIAGNOSIS — J309 Allergic rhinitis, unspecified: Secondary | ICD-10-CM

## 2012-06-07 ENCOUNTER — Ambulatory Visit (INDEPENDENT_AMBULATORY_CARE_PROVIDER_SITE_OTHER): Payer: 59

## 2012-06-07 DIAGNOSIS — J309 Allergic rhinitis, unspecified: Secondary | ICD-10-CM

## 2012-06-11 ENCOUNTER — Ambulatory Visit (INDEPENDENT_AMBULATORY_CARE_PROVIDER_SITE_OTHER): Payer: 59

## 2012-06-11 DIAGNOSIS — J309 Allergic rhinitis, unspecified: Secondary | ICD-10-CM

## 2012-06-13 ENCOUNTER — Ambulatory Visit (INDEPENDENT_AMBULATORY_CARE_PROVIDER_SITE_OTHER): Payer: 59

## 2012-06-13 DIAGNOSIS — J309 Allergic rhinitis, unspecified: Secondary | ICD-10-CM

## 2012-06-17 ENCOUNTER — Ambulatory Visit (INDEPENDENT_AMBULATORY_CARE_PROVIDER_SITE_OTHER): Payer: 59

## 2012-06-17 DIAGNOSIS — J309 Allergic rhinitis, unspecified: Secondary | ICD-10-CM

## 2012-06-24 ENCOUNTER — Ambulatory Visit (INDEPENDENT_AMBULATORY_CARE_PROVIDER_SITE_OTHER): Payer: 59

## 2012-06-24 DIAGNOSIS — J309 Allergic rhinitis, unspecified: Secondary | ICD-10-CM

## 2012-06-28 ENCOUNTER — Ambulatory Visit (INDEPENDENT_AMBULATORY_CARE_PROVIDER_SITE_OTHER): Payer: 59

## 2012-06-28 DIAGNOSIS — J309 Allergic rhinitis, unspecified: Secondary | ICD-10-CM

## 2012-07-01 ENCOUNTER — Ambulatory Visit (INDEPENDENT_AMBULATORY_CARE_PROVIDER_SITE_OTHER): Payer: 59

## 2012-07-01 DIAGNOSIS — J309 Allergic rhinitis, unspecified: Secondary | ICD-10-CM

## 2012-07-02 ENCOUNTER — Ambulatory Visit (INDEPENDENT_AMBULATORY_CARE_PROVIDER_SITE_OTHER): Payer: 59

## 2012-07-02 DIAGNOSIS — J309 Allergic rhinitis, unspecified: Secondary | ICD-10-CM

## 2012-07-04 ENCOUNTER — Ambulatory Visit (INDEPENDENT_AMBULATORY_CARE_PROVIDER_SITE_OTHER): Payer: 59

## 2012-07-04 DIAGNOSIS — J309 Allergic rhinitis, unspecified: Secondary | ICD-10-CM

## 2012-07-09 ENCOUNTER — Encounter: Payer: Self-pay | Admitting: Internal Medicine

## 2012-07-09 ENCOUNTER — Ambulatory Visit (INDEPENDENT_AMBULATORY_CARE_PROVIDER_SITE_OTHER): Payer: 59

## 2012-07-09 DIAGNOSIS — J309 Allergic rhinitis, unspecified: Secondary | ICD-10-CM

## 2012-07-12 ENCOUNTER — Ambulatory Visit (INDEPENDENT_AMBULATORY_CARE_PROVIDER_SITE_OTHER): Payer: 59

## 2012-07-12 DIAGNOSIS — J309 Allergic rhinitis, unspecified: Secondary | ICD-10-CM

## 2012-07-16 ENCOUNTER — Ambulatory Visit (INDEPENDENT_AMBULATORY_CARE_PROVIDER_SITE_OTHER): Payer: 59

## 2012-07-16 DIAGNOSIS — J309 Allergic rhinitis, unspecified: Secondary | ICD-10-CM

## 2012-07-23 ENCOUNTER — Ambulatory Visit (INDEPENDENT_AMBULATORY_CARE_PROVIDER_SITE_OTHER): Payer: 59

## 2012-07-23 DIAGNOSIS — J309 Allergic rhinitis, unspecified: Secondary | ICD-10-CM

## 2012-07-30 ENCOUNTER — Ambulatory Visit (INDEPENDENT_AMBULATORY_CARE_PROVIDER_SITE_OTHER): Payer: 59

## 2012-07-30 DIAGNOSIS — J309 Allergic rhinitis, unspecified: Secondary | ICD-10-CM

## 2012-08-06 ENCOUNTER — Ambulatory Visit (INDEPENDENT_AMBULATORY_CARE_PROVIDER_SITE_OTHER): Payer: 59

## 2012-08-06 DIAGNOSIS — J309 Allergic rhinitis, unspecified: Secondary | ICD-10-CM

## 2012-08-13 ENCOUNTER — Ambulatory Visit (INDEPENDENT_AMBULATORY_CARE_PROVIDER_SITE_OTHER): Payer: 59

## 2012-08-13 DIAGNOSIS — J309 Allergic rhinitis, unspecified: Secondary | ICD-10-CM

## 2012-08-23 ENCOUNTER — Ambulatory Visit (INDEPENDENT_AMBULATORY_CARE_PROVIDER_SITE_OTHER): Payer: 59

## 2012-08-23 DIAGNOSIS — J309 Allergic rhinitis, unspecified: Secondary | ICD-10-CM

## 2012-08-28 ENCOUNTER — Ambulatory Visit (INDEPENDENT_AMBULATORY_CARE_PROVIDER_SITE_OTHER): Payer: 59

## 2012-08-28 DIAGNOSIS — J309 Allergic rhinitis, unspecified: Secondary | ICD-10-CM

## 2012-09-03 ENCOUNTER — Ambulatory Visit (INDEPENDENT_AMBULATORY_CARE_PROVIDER_SITE_OTHER): Payer: 59

## 2012-09-03 DIAGNOSIS — J309 Allergic rhinitis, unspecified: Secondary | ICD-10-CM

## 2012-09-10 ENCOUNTER — Encounter: Payer: Self-pay | Admitting: Internal Medicine

## 2012-09-10 ENCOUNTER — Ambulatory Visit (INDEPENDENT_AMBULATORY_CARE_PROVIDER_SITE_OTHER): Payer: 59

## 2012-09-10 ENCOUNTER — Ambulatory Visit (INDEPENDENT_AMBULATORY_CARE_PROVIDER_SITE_OTHER): Payer: 59 | Admitting: Internal Medicine

## 2012-09-10 VITALS — BP 122/78 | HR 78 | Ht 76.0 in | Wt 191.2 lb

## 2012-09-10 DIAGNOSIS — J301 Allergic rhinitis due to pollen: Secondary | ICD-10-CM

## 2012-09-10 DIAGNOSIS — J309 Allergic rhinitis, unspecified: Secondary | ICD-10-CM

## 2012-09-10 DIAGNOSIS — G4733 Obstructive sleep apnea (adult) (pediatric): Secondary | ICD-10-CM

## 2012-09-10 MED ORDER — AZELASTINE-FLUTICASONE 137-50 MCG/ACT NA SUSP: 2.0000 | Freq: Every day | NASAL | Status: DC

## 2012-09-10 NOTE — Progress Notes (Signed)
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 He reports a chronic history of allergic rhinitis, with allergy skin testing broadly positive in the past but never allergy vaccine. He moved here from [place] this year. There, he was adequately controlled with a steroid nasal spray, Mucinex and Zyrtec D. Symptoms worse in spring and fall. In [place] he is needing to have plain Zyrtec as an extra dose in the evenings. He wants to avoid allergy shots. He notices primarily rhinorrhea with less congestion and no wheeze or cough and no rash. There is no history of intolerance to latex, contrast, aspirin. Food allergies have been suspected, especially seasonings and cotton seed oil.Marland Kitchen He has not had insect stings. He has felt worse on work days and he says there has been obvious black mold in his building. There is a history of major renovation of abdomen because of mold and dampness problems. He is currently living in an apartment with some carpet and mostly vinyl floor. He denies problems there. He has no pets but did have a cath in the past. He reports intense local reaction to flu vaccine on more than one trial in the past. History of obstructive sleep apnea treated with BiPAP 11/6. We asked him to track down his original diagnostic report for our file. He never smoked, but chest x-ray in the last year showed overinflation and was interpreted as "COPD". He denies respiratory symptoms except as described above and wondered if the BiPAP caused overinflation. He is interested in evaluating with PFT.  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 He reports a chronic history of allergic rhinitis, with allergy skin testing broadly positive in the past but never allergy vaccine. Blames Soy for abd cramps, gas. Did have Nasal flu vaccine last Fall. Has not procured his old sleep study from [place].    Tested Normal in past for a1AT. Using BIPAP 11/6 regularly, based on testing in [place], but suspects the pressure may explain why his CXR's are read as over-inflated "?emphysema?" in recent years. A1AT tested normal. We discussed oral appliances as alternative to BiPAP. Significant increase in nasal congestion and nose blowing since off antihistamines for skin testing.  Allergy skin testing- broadly positive reactions for grass weed and tree pollens, dust mite, feathers and 1 mold. Isolated positive reaction on food testing for beef. He will watch this without restriction.  He wants allergy vaccine.  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 Using BiPAP 11/5 Advanced all night every night. His dentist, Durward Parcel, is starting to make oral appliances. Spring pollen has been unusually well tolerated and he credits his allergy vaccine which is still building. He has used Patanase and Veramyst nasal sprays with Zyrtec and Mucinex. He would like his nurse retrained to get his allergy shots so that he doesn't have to take time to come here and we discussed this. We reviewed previous imaging reports: Chest CT at [facility] 02/22/2010: Hyperinflation interpreted as chronic changes of COPD. Small lymph nodes including 5 mm noncalcified right middle lobe, 4 mm noncalcified right middle lobe, subpleural nodular densities in the apices. 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 Never smoked. PFT: 03/19/2012-scores are high normal. FEV1 4.96/129%, FEV1/FVC 0.81. FEF 25-75% 5.07/148% with 23% response to bronchodilator.  TLC 119%, RV 113%, DLCO 130%. These indicate large lungs without the hyperinflation of emphysema suggested by radiologist interpretation of images.  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 We discussed expectations again. He is satisfied allergy vaccine is helping him. He is shifting to a [place] office but we discussed his plans to continue getting his vaccine injections here. Veramyst helps.. Using Zyrtec in the morning Zyrtec-D in the evening with patanase twice daily OSA doing well. Good compliance and control with BiPAP 11/5. Marland Kitchen ROS-see HPI Constitutional:   No-   weight loss, night sweats, fevers, chills, fatigue, lassitude. HEENT:   No-  headaches, difficulty swallowing, tooth/dental problems, sore throat,      + sneezing, itching, ear ache, nasal congestion,+ post nasal drip,  CV:  No-   chest pain, orthopnea, PND, swelling in lower extremities, anasarca, dizziness, palpitations Resp: No-   shortness of breath with exertion or at rest.              No-   productive cough,  No non-productive cough,  No- coughing up of blood.              No-   change in color of mucus.  No- wheezing.   Skin: No-   rash or lesions. GI:  No-   heartburn, indigestion, abdominal pain, nausea, vomiting, GU: . MS:  No-   joint pain or swelling.   Neuro-     nothing unusual Psych:  No- change in mood or affect. No depression or anxiety.  No memory loss.  OBJ General- Alert, Oriented, Affect-appropriate, Distress- none acute; tall slender man Skin- rash-none, lesions- none, excoriation- none Lymphadenopathy- none Head- atraumatic            Eyes- Gross vision intact, PERRLA, conjunctivae clear secretions            Ears- Hearing, canals-normal            Nose- narrow with mucus bridging, +Septal dev and external nose (hx fx), mucus, No-polyps, erosion, perforation.             Throat- Mallampati II , mucosa clear , drainage- none, tonsils- atrophic Neck- flexible , trachea midline, no stridor , thyroid nl, carotid no bruit Chest - symmetrical excursion , unlabored           Heart/CV- RRR , no murmur , no gallop  , no rub, nl s1 s2                            - JVD- none , edema- none, stasis changes- none, varices- none           Lung- clear to P&A, wheeze- none, cough- none , dullness-none, rub- none           Chest wall-  Abd-  Br/ Gen/ Rectal- Not done, not indicated Extrem- cyanosis- none, clubbing, none, atrophy- none, strength- nl Neuro- grossly intact to observation

## 2012-09-10 NOTE — Patient Instructions (Addendum)
Sample Dymista nasal spray    1-2 puffs once daily at bedtime. This is a combination antihistamine like Patanse, and a steroid like Veramyst. Try it instead of those two for the duration of the sample, as a comparison.  We will have the allergy lab advance the concentration of your allergy vaccine to 1:10 next time you order, then build the volume gradually.  Continue BiPAP 11/5

## 2012-09-14 ENCOUNTER — Encounter: Payer: Self-pay | Admitting: Gastroenterology

## 2012-09-14 ENCOUNTER — Encounter: Payer: Self-pay | Admitting: Internal Medicine

## 2012-09-16 NOTE — Telephone Encounter (Signed)
Recall is due 04/24/2017.  I have updated this in the health maintenance section of the patient's  Chart.

## 2012-09-17 ENCOUNTER — Ambulatory Visit (INDEPENDENT_AMBULATORY_CARE_PROVIDER_SITE_OTHER): Payer: 59

## 2012-09-17 ENCOUNTER — Encounter: Payer: Self-pay | Admitting: Internal Medicine

## 2012-09-17 DIAGNOSIS — J309 Allergic rhinitis, unspecified: Secondary | ICD-10-CM

## 2012-09-17 NOTE — Telephone Encounter (Signed)
Glaucoma added to pt's chart in abstract done by GI.  Per pt's report, he does not have glaucoma.  This has been removed from pt's PMH.  Pt notified via email.  Will sign off.

## 2012-09-17 NOTE — Assessment & Plan Note (Signed)
Generally good control on allergy vaccine at 1:50 with room to go up. Plan-advance allergy vaccine 1:10 with next order as discussed. We will continue to get injections here. Sample Dymista.

## 2012-09-17 NOTE — Telephone Encounter (Signed)
Pt had PFT 02/2012 so is aware of results.  Explained to pt via email that the results are printed, reviewed and scanned into the chart as PFT system is not "linked" to epic.  Advised pt to contact office w/ further questions/concerns.

## 2012-09-23 ENCOUNTER — Ambulatory Visit (INDEPENDENT_AMBULATORY_CARE_PROVIDER_SITE_OTHER): Payer: 59

## 2012-09-23 DIAGNOSIS — J309 Allergic rhinitis, unspecified: Secondary | ICD-10-CM

## 2012-10-02 ENCOUNTER — Ambulatory Visit (INDEPENDENT_AMBULATORY_CARE_PROVIDER_SITE_OTHER): Payer: 59

## 2012-10-02 DIAGNOSIS — J309 Allergic rhinitis, unspecified: Secondary | ICD-10-CM

## 2012-10-09 ENCOUNTER — Ambulatory Visit (INDEPENDENT_AMBULATORY_CARE_PROVIDER_SITE_OTHER): Payer: 59

## 2012-10-09 DIAGNOSIS — J309 Allergic rhinitis, unspecified: Secondary | ICD-10-CM

## 2012-10-23 ENCOUNTER — Ambulatory Visit (INDEPENDENT_AMBULATORY_CARE_PROVIDER_SITE_OTHER): Payer: 59

## 2012-10-23 DIAGNOSIS — J309 Allergic rhinitis, unspecified: Secondary | ICD-10-CM

## 2012-11-04 ENCOUNTER — Telehealth: Payer: Self-pay | Admitting: Internal Medicine

## 2012-11-04 MED ORDER — FLUTICASONE FUROATE 27.5 MCG/SPRAY NA SUSP: 2.0000 | Freq: Every day | NASAL | Status: DC

## 2012-11-04 NOTE — Telephone Encounter (Signed)
Pt states he is needing a refill on veramyst but the pharmacy received a a denial stating the pt needed an appt. Pt was seen in September with instruct to f/u in 1 year, which pt has scheduled. Refill sent with 11 refills. Carron Curie, CMA

## 2012-11-06 ENCOUNTER — Ambulatory Visit (INDEPENDENT_AMBULATORY_CARE_PROVIDER_SITE_OTHER): Payer: 59

## 2012-11-06 DIAGNOSIS — J309 Allergic rhinitis, unspecified: Secondary | ICD-10-CM

## 2012-11-07 ENCOUNTER — Telehealth: Payer: Self-pay | Admitting: *Deleted

## 2012-11-07 ENCOUNTER — Ambulatory Visit (INDEPENDENT_AMBULATORY_CARE_PROVIDER_SITE_OTHER): Payer: 59

## 2012-11-07 DIAGNOSIS — J309 Allergic rhinitis, unspecified: Secondary | ICD-10-CM

## 2012-11-07 NOTE — Telephone Encounter (Signed)
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 at which time you said"go to 1:10 next order. It's time for him to order. Please write a rx. Nita Sickle

## 2012-11-07 NOTE — Telephone Encounter (Signed)
We have signed 1:10 rx.

## 2012-11-13 ENCOUNTER — Ambulatory Visit (INDEPENDENT_AMBULATORY_CARE_PROVIDER_SITE_OTHER): Payer: 59

## 2012-11-13 DIAGNOSIS — J309 Allergic rhinitis, unspecified: Secondary | ICD-10-CM

## 2012-11-19 ENCOUNTER — Ambulatory Visit (INDEPENDENT_AMBULATORY_CARE_PROVIDER_SITE_OTHER): Payer: 59

## 2012-11-19 DIAGNOSIS — J309 Allergic rhinitis, unspecified: Secondary | ICD-10-CM

## 2012-11-25 ENCOUNTER — Ambulatory Visit (INDEPENDENT_AMBULATORY_CARE_PROVIDER_SITE_OTHER): Payer: 59

## 2012-11-25 DIAGNOSIS — J309 Allergic rhinitis, unspecified: Secondary | ICD-10-CM

## 2012-12-03 ENCOUNTER — Ambulatory Visit (INDEPENDENT_AMBULATORY_CARE_PROVIDER_SITE_OTHER): Payer: 59

## 2012-12-03 DIAGNOSIS — J309 Allergic rhinitis, unspecified: Secondary | ICD-10-CM

## 2012-12-10 ENCOUNTER — Ambulatory Visit (INDEPENDENT_AMBULATORY_CARE_PROVIDER_SITE_OTHER): Payer: 59

## 2012-12-10 DIAGNOSIS — J309 Allergic rhinitis, unspecified: Secondary | ICD-10-CM

## 2012-12-20 ENCOUNTER — Telehealth: Payer: Self-pay | Admitting: Internal Medicine

## 2012-12-20 ENCOUNTER — Ambulatory Visit (INDEPENDENT_AMBULATORY_CARE_PROVIDER_SITE_OTHER): Payer: 59

## 2012-12-20 DIAGNOSIS — J309 Allergic rhinitis, unspecified: Secondary | ICD-10-CM

## 2012-12-20 MED ORDER — FLUTICASONE FUROATE 27.5 MCG/SPRAY NA SUSP: 2.0000 | Freq: Every day | NASAL | Status: DC

## 2012-12-20 MED ORDER — OLOPATADINE HCL 0.6 % NA SOLN: NASAL | Status: DC

## 2012-12-20 NOTE — Telephone Encounter (Signed)
Pt aware that we have updated his pharmacy and that his rx's have been sent in. I advised him that if Veramyst needs a prior auth, we will take care of that for him.

## 2012-12-26 ENCOUNTER — Telehealth: Payer: Self-pay | Admitting: Internal Medicine

## 2012-12-26 ENCOUNTER — Ambulatory Visit (INDEPENDENT_AMBULATORY_CARE_PROVIDER_SITE_OTHER): Payer: 59

## 2012-12-26 DIAGNOSIS — J309 Allergic rhinitis, unspecified: Secondary | ICD-10-CM

## 2012-12-26 NOTE — Telephone Encounter (Signed)
[name] spoke w/ pharmacy.  Pharmacy did receive the Rx.  Will fill under the generic fluticasone.  Pt verbalized understanding & stated nothing further needed at this time.  Patrick Moyer

## 2013-01-01 ENCOUNTER — Ambulatory Visit (INDEPENDENT_AMBULATORY_CARE_PROVIDER_SITE_OTHER): Payer: 59

## 2013-01-01 DIAGNOSIS — J309 Allergic rhinitis, unspecified: Secondary | ICD-10-CM

## 2013-01-06 ENCOUNTER — Ambulatory Visit (INDEPENDENT_AMBULATORY_CARE_PROVIDER_SITE_OTHER): Payer: 59

## 2013-01-06 DIAGNOSIS — J309 Allergic rhinitis, unspecified: Secondary | ICD-10-CM

## 2013-01-09 ENCOUNTER — Encounter: Payer: Self-pay | Admitting: Internal Medicine

## 2013-01-13 ENCOUNTER — Ambulatory Visit (INDEPENDENT_AMBULATORY_CARE_PROVIDER_SITE_OTHER): Payer: 59

## 2013-01-13 DIAGNOSIS — J309 Allergic rhinitis, unspecified: Secondary | ICD-10-CM

## 2013-01-28 ENCOUNTER — Ambulatory Visit (INDEPENDENT_AMBULATORY_CARE_PROVIDER_SITE_OTHER): Payer: 59

## 2013-01-28 DIAGNOSIS — J309 Allergic rhinitis, unspecified: Secondary | ICD-10-CM

## 2013-02-08 ENCOUNTER — Other Ambulatory Visit: Payer: Self-pay

## 2013-02-10 ENCOUNTER — Ambulatory Visit (INDEPENDENT_AMBULATORY_CARE_PROVIDER_SITE_OTHER): Payer: 59

## 2013-02-10 DIAGNOSIS — J309 Allergic rhinitis, unspecified: Secondary | ICD-10-CM

## 2013-02-15 ENCOUNTER — Other Ambulatory Visit (HOSPITAL_COMMUNITY): Payer: Self-pay | Admitting: Family Medicine

## 2013-02-15 ENCOUNTER — Ambulatory Visit (HOSPITAL_COMMUNITY)
Admission: RE | Admit: 2013-02-15 | Discharge: 2013-02-15 | Disposition: A | Discharge status: Home / Self Care | Payer: 59 | Source: Ambulatory Visit | Attending: Family Medicine | Admitting: Family Medicine

## 2013-02-15 DIAGNOSIS — M25539 Pain in unspecified wrist: Secondary | ICD-10-CM | POA: Insufficient documentation

## 2013-02-15 DIAGNOSIS — R52 Pain, unspecified: Secondary | ICD-10-CM

## 2013-02-17 ENCOUNTER — Ambulatory Visit (INDEPENDENT_AMBULATORY_CARE_PROVIDER_SITE_OTHER): Payer: 59

## 2013-02-17 ENCOUNTER — Ambulatory Visit (HOSPITAL_COMMUNITY)
Admission: RE | Admit: 2013-02-17 | Discharge: 2013-02-17 | Disposition: A | Discharge status: Home / Self Care | Payer: 59 | Source: Ambulatory Visit | Attending: Family Medicine | Admitting: Family Medicine

## 2013-02-17 DIAGNOSIS — J309 Allergic rhinitis, unspecified: Secondary | ICD-10-CM

## 2013-02-17 NOTE — Evaluation (Signed)
Occupational Therapy Note  Patient Details  Name: Patrick Moyer MRN: [id number] Date of Birth: May 14, 1951  Today's Date: 02/17/2013   Patient arrived today for occupational therapy evaluation for right wrist pain. Patient was under time constraint and was unable to stay for full evaluation time. Rescheduled evaluation for Wednesday 02/19/13.  Limmie Patricia, OTR/L

## 2013-02-19 ENCOUNTER — Ambulatory Visit (HOSPITAL_COMMUNITY)
Admission: RE | Admit: 2013-02-19 | Discharge: 2013-02-19 | Disposition: A | Discharge status: Home / Self Care | Payer: 59 | Source: Ambulatory Visit | Attending: Family Medicine | Admitting: Family Medicine

## 2013-02-19 DIAGNOSIS — M6281 Muscle weakness (generalized): Secondary | ICD-10-CM | POA: Insufficient documentation

## 2013-02-19 DIAGNOSIS — M25549 Pain in joints of unspecified hand: Secondary | ICD-10-CM | POA: Insufficient documentation

## 2013-02-19 NOTE — Evaluation (Signed)
Occupational Therapy Evaluation  Patient Details  Name: Patrick Moyer MRN: [id number] Date of Birth: March 06, 1951  Today's Date: 02/19/2013 Time: 4098-1191 OT Time Calculation (min): 36 min OT eval [id number] 36'  Visit#: 1 of 16  Re-eval: 03/19/13  Assessment Diagnosis: right wrist pain Prior Therapy: N/A  Authorization: [employer]  Authorization Time Period:    Authorization Visit#:   of     Past Medical History:  Past Medical History  Diagnosis Date  . Multiple allergies   . Sleep apnea     BIPAP  . Low blood pressure   . Aortic root enlargement     Upper Limits  . 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 INH x 1 year  . Positive PPD     Age 28, INH x 1 year, never active TB  . ALLERGIC RHINITIS   . Depression   . Diverticulosis   . GERD (gastroesophageal reflux disease)   . History of pneumonia   . Pigment dispersion syndrome   . Marfan's syndrome    Past Surgical History:  Past Surgical History  Procedure Laterality Date  . Inguinal hernia repair  1978  . Nasal septum surgery  1975    nasal  age 78  . Appendectomy  2010  . Wisdom tooth extraction  1975  . Digital nerve repair  1995    Subjective Symptoms/Limitations Symptoms: S: It's really painful when I'm line dancing and people hit it. 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 Dr. Abigail Miyamoto diagnosed patient with a small bone fragment lateral to scaphoid bone. Dr. Abigail Miyamoto would like patient to participate in occupational therapy before considering surgery. He has been referred to occupational therapy for evaluation and treatment. 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 Leisure: Hobbies-yes (Comment) Comments: Line dancing  Assessment ADL/Vision/Perception Dominant Hand: Right Vision - History Baseline Vision: Wears glasses all the time  Cognition/Observation Cognition Overall Cognitive Status: Appears within functional limits for tasks assessed Arousal/Alertness: Awake/alert Orientation Level: Oriented X4  Sensation/Coordination/Edema Sensation Light Touch: Appears Intact Stereognosis: Appears Intact Hot/Cold: Appears Intact Proprioception: Appears Intact Coordination Gross Motor Movements are Fluid and Coordinated: Yes Fine Motor Movements are Fluid and Coordinated: Yes Edema Edema: Wrist crest R: 17.5 cm L: 17cm  Additional Assessments RUE Assessment RUE Assessment:  (Pain and stiffness with AROM) RUE AROM (degrees) Right Forearm Pronation: 90 Degrees Right Forearm Supination: 85 Degrees Right Wrist Extension: 40 Degrees Right Wrist Flexion: 50 Degrees Right Wrist Radial Deviation: 14 Degrees Right Wrist Ulnar Deviation: 32 Degrees RUE Strength Right Wrist Flexion: 4/5 Right Wrist Extension: 4/5 Grip (lbs): 106 Lateral Pinch: 23 lbs 3 Point Pinch: 22 lbs LUE AROM (degrees) Left Wrist Extension: 60 Degrees Left Wrist Flexion: 80 Degrees Left Wrist Radial Deviation: 20 Degrees Left Wrist Ulnar Deviation: 34 Degrees LUE Strength Left Wrist Flexion: 5/5 Left Wrist Extension: 5/5 Grip (lbs): 100 Lateral Pinch: 24 lbs 3 Point Pinch: 26 lbs     Occupational Therapy Assessment and Plan OT Assessment and Plan Clinical Impression  Statement: A:  Patient is a 62 year old with a wrist pain s/p wrist fx.  He presents with increased pain, stiffness, fascial restrictions, and  edema, and decreased AROM and strength.  Patient will benefit from skilled OT intervention to improve upon these deficits and return to his prior level of I with all B/IADL, work, and leisure activities. Pt will benefit from skilled therapeutic intervention in order to improve on the following deficits: Pain;Impaired flexibility;Decreased range of motion;Increased fascial restricitons;Decreased mobility;Increased edema;Impaired UE functional use Rehab Potential: Excellent OT Frequency: Min 2X/week OT Duration: 8 weeks OT Treatment/Interventions: Self-care/ADL training;Therapeutic exercise;Therapeutic activities;Manual therapy;Modalities;Splinting;Patient/family education   Goals Short Term Goals Time to Complete Short Term Goals: 4 weeks Short Term Goal 1: Patient will be educated on HEP. Short Term Goal 2: Patient will increase pinch strength by 5# to be able to write and open tight jars with decreased difficutly. Short Term Goal 3: Patient will decrease pain level to 4/10 when line dancing. Short Term Goal 4: Patient will decrease edema in wrist by .5 cm to allow for greater mobility and functional use of his right hand.   Short Term Goal 5: Patient will decrease fascial restrictions in right hand from min to trace. Long Term Goals Time to Complete Long Term Goals: 8 weeks Long Term Goal 1: Patient will return to highest level of independence with I/BADL, work, and leisure activities.  Long Term Goal 2: Patient will increase pinch strength by 10# to be able to write and open tight jars with decreased difficulty. Long Term Goal 3: Patient will decrease pain level to 1/10 when line dancing. Long Term Goal 4: Patient will increase strength in LUE wrist and hand to 5/5. Long Term Goal 5: Patient will decrease fascial restrictions in right hand from trace to zero.  Problem List Patient Active Problem List  Diagnosis  . Allergic rhinitis due to pollen  . 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  Obstructive sleep apnea  . Positive TB test  . Abnormal finding on chest xray  . Malabsorption  . Pain in joint, hand  . Muscle weakness (generalized)    End of Session Activity Tolerance: Patient tolerated treatment well General Behavior During Session: [facility] for tasks performed Cognition: [facility] for tasks performed OT Plan of Care OT Home Exercise Plan: theraputty and AROM exercises OT Patient Instructions: handout Consulted and Agree with Plan of Care: Patient   Limmie Patricia, OTR/L  02/19/2013, 9:10 AM  Physician Documentation Your signature is required to indicate approval of the treatment plan as stated above.  Please sign and either send electronically or make a copy of this report for your files and return this physician signed original.  Please mark one 1.__approve of plan  2. ___approve of plan with the following conditions.   ______________________________                                                          _____________________ Physician Signature  Date  

## 2013-02-24 ENCOUNTER — Ambulatory Visit: Payer: 59

## 2013-02-24 ENCOUNTER — Ambulatory Visit (HOSPITAL_COMMUNITY)
Admission: RE | Admit: 2013-02-24 | Discharge: 2013-02-24 | Disposition: A | Discharge status: Home / Self Care | Payer: 59 | Source: Ambulatory Visit | Attending: Family Medicine | Admitting: Family Medicine

## 2013-02-24 DIAGNOSIS — G4733 Obstructive sleep apnea (adult) (pediatric): Secondary | ICD-10-CM | POA: Insufficient documentation

## 2013-02-24 DIAGNOSIS — R7611 Nonspecific reaction to tuberculin skin test without active tuberculosis: Secondary | ICD-10-CM | POA: Insufficient documentation

## 2013-02-24 DIAGNOSIS — IMO0001 Reserved for inherently not codable concepts without codable children: Secondary | ICD-10-CM | POA: Insufficient documentation

## 2013-02-24 DIAGNOSIS — M6281 Muscle weakness (generalized): Secondary | ICD-10-CM | POA: Insufficient documentation

## 2013-02-24 DIAGNOSIS — M25539 Pain in unspecified wrist: Secondary | ICD-10-CM | POA: Insufficient documentation

## 2013-02-24 DIAGNOSIS — J309 Allergic rhinitis, unspecified: Secondary | ICD-10-CM | POA: Insufficient documentation

## 2013-02-24 NOTE — Progress Notes (Signed)
Occupational Therapy Treatment Patient Details  Name: Patrick Moyer MRN: [id number] Date of Birth: [birth year]  Today's Date: 02/24/2013 Time: 0810-0828 OT Time Calculation (min): 18 min MFR 810-818 8' Therex 409-811 10'  Visit#: 2 of 16  Re-eval: 03/19/13    Authorization: [employer]  Authorization Time Period:    Authorization Visit#:   of    Subjective Symptoms/Limitations Symptoms: S:  I worked my wrist alot this weekend.  I made a play refridgerator for my granddaughter and put about 150 screws in it.  Pain Assessment Currently in Pain?: Yes Pain Score:   4 Pain Location: Wrist Pain Orientation: Right Pain Type: Acute pain  Precautions/Restrictions   None  Exercise/Treatments Wrist Exercises Wrist Flexion: AROM;10 reps Wrist Extension: AROM;10 reps Wrist Radial Deviation: AROM;10 reps Wrist Ulnar Deviation: AROM;10 reps         Manual Therapy Manual Therapy: Myofascial release Myofascial Release: MFR to flexor and extensor forearm, wrist, and hand to decrease pain and fascial restrictions and increase pain free mobility.  Passive stretching into flexion and extension while completing MFR.  Gentle joint mobs to wrist and hand.  completed by Leia Alf, OTR/L [phone]   Occupational Therapy Assessment and Plan OT Assessment and Plan Clinical Impression Statement: A: Patient needed to leave early due to having a patient scheduled at 830. Patient shows increased AROM during wrist flexion and extension.  Pt will benefit from skilled therapeutic intervention in order to improve on the following deficits: Pain;Impaired flexibility;Decreased range of motion;Increased fascial restricitons;Decreased mobility;Increased edema;Impaired UE functional use OT Plan: P: Cont. to work on AROM of wrist. Add theraputty exercises.   Goals Short Term Goals Time to Complete Short Term Goals: 4 weeks Short Term Goal 1: Patient will be educated on HEP. Short Term Goal 1 Progress: Progressing  toward goal Short Term Goal 2: Patient will increase pinch strength by 5# to be able to write with decreased difficutly. Short Term Goal 2 Progress: Progressing toward goal Short Term Goal 3: Patient will decrease pain level to 4/10 when line dancing. Short Term Goal 3 Progress: Progressing toward goal Short Term Goal 4: Patient will decrease edema in wrist by .5 cm to allow for greater mobility and functional use of his right hand.   Short Term Goal 4 Progress: Progressing toward goal Short Term Goal 5: Patient will decrease fascial restrictions in right hand from min to trace. Short Term Goal 5 Progress: Progressing toward goal Long Term Goals Time to Complete Long Term Goals: 8 weeks Long Term Goal 1: Patient will return to highest level of independence with I/BADL, work, and leisure activities.  Long Term Goal 1 Progress: Progressing toward goal Long Term Goal 2: Patient will increase pinch strength by 10# to be able to write with decreased difficulty. Long Term Goal 2 Progress: Progressing toward goal Long Term Goal 3: Patient will decrease pain level to 1/10 when line dancing. Long Term Goal 3 Progress: Progressing toward goal Long Term Goal 4: Patient will increase strength in LUE wrist and hand to 5/5. Long Term Goal 4 Progress: Progressing toward goal Long Term Goal 5: Patient will decrease fascial restrictions in right hand from trace to zero. Long Term Goal 5 Progress: Progressing toward goal  Problem List Patient Active Problem List  Diagnosis  . Allergic rhinitis due to pollen  . 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 Obstructive sleep apnea  . Positive TB test  . Abnormal finding on chest xray  . Malabsorption  . Pain in  joint, hand  . Muscle weakness (generalized)    End of Session Activity Tolerance: Patient tolerated treatment well General Behavior During Session: [facility] for tasks performed Cognition: [facility] for tasks performed   Limmie Patricia, OTR/L  02/24/2013,  8:42 AM

## 2013-02-25 ENCOUNTER — Ambulatory Visit (INDEPENDENT_AMBULATORY_CARE_PROVIDER_SITE_OTHER): Payer: 59

## 2013-02-25 DIAGNOSIS — J309 Allergic rhinitis, unspecified: Secondary | ICD-10-CM

## 2013-02-27 ENCOUNTER — Ambulatory Visit (HOSPITAL_COMMUNITY)
Admission: RE | Admit: 2013-02-27 | Discharge: 2013-02-27 | Disposition: A | Discharge status: Home / Self Care | Payer: 59 | Source: Ambulatory Visit | Attending: Family Medicine | Admitting: Family Medicine

## 2013-02-27 NOTE — Progress Notes (Signed)
Occupational Therapy Treatment Patient Details  Name: Patrick Moyer MRN: [id number] Date of Birth: Jul 15, 1951  Today's Date: 02/27/2013 Time: 0800-0825 OT Time Calculation (min): 25 min Manual Therapy 25' Visit#: 3 of 16  Re-eval: 03/19/13    Authorization: [employer]  Authorization Time Period:    Authorization Visit#:   of    Subjective Symptoms/Limitations Symptoms: S:  Im still having some restrictions with flexion.   Pain Assessment Currently in Pain?: Yes Pain Score:   4 Pain Location: Wrist Pain Orientation: Right Pain Type: Acute pain Pain Radiating Towards: stiffness  Precautions/Restrictions     Exercise/Treatments Wrist Exercises Forearm Supination: PROM;10 reps Forearm Pronation: PROM;10 reps Wrist Flexion: PROM;10 reps Wrist Extension: PROM;10 reps Wrist Radial Deviation: PROM;10 reps Wrist Ulnar Deviation: PROM;10 reps    Manual Therapy Manual Therapy: Myofascial release Myofascial Release: MFR to flexor and extensor forearm, wrist, and hand to decrease pain and fascial restrictions and increase pain free mobility. Passive stretching into flexion and extension while completing MFR. Gentle joint mobs to wrist and hand.   Occupational Therapy Assessment and Plan OT Assessment and Plan Clinical Impression Statement: A:  Patient continues to make significant improvements in PROM and AROM of his right wrist.   OT Plan: P:  Begin strengthening exercises for wrist and hand.   Goals Short Term Goals Time to Complete Short Term Goals: 4 weeks Short Term Goal 1: Patient will be educated on HEP. Short Term Goal 2: Patient will increase pinch strength by 5# to be able to write with decreased difficutly. Short Term Goal 3: Patient will decrease pain level to 4/10 when line dancing. Short Term Goal 4: Patient will decrease edema in wrist by .5 cm to allow for greater mobility and functional use of his right hand.   Short Term Goal 5: Patient will decrease fascial  restrictions in right hand from min to trace. Long Term Goals Time to Complete Long Term Goals: 8 weeks Long Term Goal 1: Patient will return to highest level of independence with I/BADL, work, and leisure activities.  Long Term Goal 2: Patient will increase pinch strength by 10# to be able to write with decreased difficulty. Long Term Goal 3: Patient will decrease pain level to 1/10 when line dancing. Long Term Goal 4: Patient will increase strength in LUE wrist and hand to 5/5. Long Term Goal 5: Patient will decrease fascial restrictions in right hand from trace to zero.  Problem List Patient Active Problem List  Diagnosis  . Allergic rhinitis due to pollen  . 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 Obstructive sleep apnea  . Positive TB test  . Abnormal finding on chest xray  . Malabsorption  . Pain in joint, hand  . Muscle weakness (generalized)    End of Session Activity Tolerance: Patient tolerated treatment well General Behavior During Session: [facility] for tasks performed Cognition: [facility] for tasks performed OT Plan of Care OT Home Exercise Plan: upgraded to moderate resist pink tputty.  Shirlean Mylar, OTR/L  02/27/2013, 8:32 AM

## 2013-03-03 ENCOUNTER — Ambulatory Visit (HOSPITAL_COMMUNITY)
Admission: RE | Admit: 2013-03-03 | Discharge: 2013-03-03 | Disposition: A | Discharge status: Home / Self Care | Payer: 59 | Source: Ambulatory Visit | Attending: Family Medicine | Admitting: Family Medicine

## 2013-03-03 ENCOUNTER — Ambulatory Visit (INDEPENDENT_AMBULATORY_CARE_PROVIDER_SITE_OTHER): Payer: 59

## 2013-03-03 DIAGNOSIS — J309 Allergic rhinitis, unspecified: Secondary | ICD-10-CM

## 2013-03-03 NOTE — Progress Notes (Signed)
Occupational Therapy Treatment Patient Details  Name: Patrick Moyer MRN: [id number] Date of Birth: [birth year]  Today's Date: 03/03/2013 Time: 0800-0826 OT Time Calculation (min): 26 min MFR 800-808 8' Therex 191-478 18'  Visit#: 4 of 16  Re-eval: 03/19/13    Authorization: [employer]  Authorization Time Period:    Authorization Visit#:   of    Subjective Symptoms/Limitations Symptoms: S: I played half a game of bowling with my right hand and had to switch and use my left hand.  Pain Assessment Currently in Pain?: No/denies  Precautions/Restrictions     Exercise/Treatments Wrist Exercises Forearm Supination: PROM;10 reps Forearm Pronation: PROM;10 reps Wrist Flexion: PROM;10 reps Wrist Extension: PROM;10 reps Wrist Radial Deviation: PROM;10 reps Wrist Ulnar Deviation: PROM;10 reps   Theraputty: Roll;Grip;Pinch Theraputty - Roll: pink Theraputty - Grip: pink Theraputty - Pinch: pink  Wrist Weighted Stretch Wrist Flexion - Weighted Stretch: 30 seconds;1 pound Wrist Extension - Weighted Stretch: 30 seconds;1 pound Radial Deviation - Weighted Stretch: 30 seconds;1 pound      Manual Therapy Manual Therapy: Myofascial release Myofascial Release: MFR to flexor and extensor forearm, wrist, and hand to decrease pain and fascial restrictions and increase pain free mobility. Passive stretching into flexion and extension while completing MFR. Gentle joint mobs to wrist and hand.   Occupational Therapy Assessment and Plan OT Assessment and Plan Clinical Impression Statement: A: Increased pain this weekend when playing bowling. Patient noted with tightness in forearm. OT Plan: P: Cont. to work on strengtheing exercises for wrist and hand.   Goals Short Term Goals Time to Complete Short Term Goals: 4 weeks Short Term Goal 1: Patient will be educated on HEP. Short Term Goal 1 Progress: Met Short Term Goal 2: Patient will increase pinch strength by 5# to be able to write with  decreased difficutly. Short Term Goal 2 Progress: Progressing toward goal Short Term Goal 3: Patient will decrease pain level to 4/10 when line dancing. Short Term Goal 3 Progress: Progressing toward goal Short Term Goal 4: Patient will decrease edema in wrist by .5 cm to allow for greater mobility and functional use of his right hand.   Short Term Goal 4 Progress: Progressing toward goal Short Term Goal 5: Patient will decrease fascial restrictions in right hand from min to trace. Short Term Goal 5 Progress: Progressing toward goal Long Term Goals Time to Complete Long Term Goals: 8 weeks Long Term Goal 1: Patient will return to highest level of independence with I/BADL, work, and leisure activities.  Long Term Goal 1 Progress: Progressing toward goal Long Term Goal 2: Patient will increase pinch strength by 10# to be able to write with decreased difficulty. Long Term Goal 2 Progress: Progressing toward goal Long Term Goal 3: Patient will decrease pain level to 1/10 when line dancing. Long Term Goal 3 Progress: Progressing toward goal Long Term Goal 4: Patient will increase strength in LUE wrist and hand to 5/5. Long Term Goal 4 Progress: Progressing toward goal Long Term Goal 5: Patient will decrease fascial restrictions in right hand from trace to zero. Long Term Goal 5 Progress: Progressing toward goal  Problem List Patient Active Problem List  Diagnosis  . Allergic rhinitis due to pollen  . 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 Obstructive sleep apnea  . Positive TB test  . Abnormal finding on chest xray  . Malabsorption  . Pain in joint, hand  . Muscle weakness (generalized)    End of Session Activity Tolerance: Patient tolerated treatment  well General Behavior During Session: [facility] for tasks performed Cognition: [facility] for tasks performed   Limmie Patricia, OTR/L  03/03/2013, 8:50 AM

## 2013-03-06 ENCOUNTER — Ambulatory Visit (HOSPITAL_COMMUNITY): Payer: 59 | Admitting: Specialist

## 2013-03-07 ENCOUNTER — Ambulatory Visit (HOSPITAL_COMMUNITY)
Admission: RE | Admit: 2013-03-07 | Discharge: 2013-03-07 | Disposition: A | Discharge status: Home / Self Care | Payer: 59 | Source: Ambulatory Visit | Attending: Family Medicine | Admitting: Family Medicine

## 2013-03-07 NOTE — Progress Notes (Signed)
Occupational Therapy Treatment Patient Details  Name: Patrick Moyer MRN: [id number] Date of Birth: 1951/12/04  Today's Date: 03/07/2013 Time: 4098-1191 OT Time Calculation (min): 26 min MFR 478-295 10' Therex 621-308 16'  Visit#: 5 of 16  Re-eval: 03/19/13    Authorization: [employer]  Authorization Time Period:    Authorization Visit#:   of    Subjective Symptoms/Limitations Symptoms: S: I plan on going dancing tomorrow night so I'm gearing myself up for that. Pain Assessment Currently in Pain?: Yes Pain Score:   1 Pain Location: Wrist Pain Orientation: Right Pain Type: Acute pain  Precautions/Restrictions  Precautions Precautions: None  Exercise/Treatments Wrist Exercises Forearm Supination: PROM;10 reps;Strengthening;Bar weights/barbell (12X) Bar Weights/Barbell (Forearm Supination): 1 lb Forearm Pronation: PROM;10 reps;Strengthening;Bar weights/barbell (12X) Bar Weights/Barbell (Forearm Pronation): 1 lb Wrist Flexion: PROM;10 reps;Strengthening;Bar weights/barbell (12X) Bar Weights/Barbell (Wrist Flexion): 1 lb Wrist Extension: PROM;10 reps;Strengthening;Bar weights/barbell (12X) Bar Weights/Barbell (Wrist Extension): 1 lb Wrist Radial Deviation: PROM;10 reps;Strengthening;Bar weights/barbell (12X; 1#) Wrist Ulnar Deviation: PROM;10 reps;Strengthening;Bar weights/barbell (12X; 1#)   Theraputty:  (twist; green)  Wrist Weighted Stretch Wrist Flexion - Weighted Stretch: 1 pound;60 seconds Wrist Extension - Weighted Stretch: 1 pound;60 seconds Radial Deviation - Weighted Stretch: 1 pound;60 seconds      Manual Therapy Manual Therapy: Myofascial release Myofascial Release: MFR to flexor and extensor forearm, wrist, and hand to decrease pain and fascial restrictions and increase pain free mobility. Passive stretching into flexion and extension while completing MFR. Gentle joint mobs to wrist and hand.   Occupational Therapy Assessment and Plan OT Assessment and  Plan Clinical Impression Statement: A: Increase time during weighted stretch. Added wrist strengthening with 1#.  OT Plan: P: Cont. to work on strengtheing exercises for wrist and hand.   Goals Short Term Goals Time to Complete Short Term Goals: 4 weeks Short Term Goal 1: Patient will be educated on HEP. Short Term Goal 2: Patient will increase pinch strength by 5# to be able to write with decreased difficutly. Short Term Goal 2 Progress: Progressing toward goal Short Term Goal 3: Patient will decrease pain level to 4/10 when line dancing. Short Term Goal 3 Progress: Progressing toward goal Short Term Goal 4: Patient will decrease edema in wrist by .5 cm to allow for greater mobility and functional use of his right hand.   Short Term Goal 4 Progress: Progressing toward goal Short Term Goal 5: Patient will decrease fascial restrictions in right hand from min to trace. Short Term Goal 5 Progress: Progressing toward goal Long Term Goals Time to Complete Long Term Goals: 8 weeks Long Term Goal 1: Patient will return to highest level of independence with I/BADL, work, and leisure activities.  Long Term Goal 1 Progress: Progressing toward goal Long Term Goal 2: Patient will increase pinch strength by 10# to be able to write with decreased difficulty. Long Term Goal 2 Progress: Progressing toward goal Long Term Goal 3: Patient will decrease pain level to 1/10 when line dancing. Long Term Goal 3 Progress: Progressing toward goal Long Term Goal 4: Patient will increase strength in LUE wrist and hand to 5/5. Long Term Goal 4 Progress: Progressing toward goal Long Term Goal 5: Patient will decrease fascial restrictions in right hand from trace to zero. Long Term Goal 5 Progress: Progressing toward goal  Problem List Patient Active Problem List  Diagnosis  . Allergic rhinitis due to pollen  . 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 Obstructive sleep apnea  . Positive TB test  . Abnormal  finding on  chest xray  . Malabsorption  . Pain in joint, hand  . Muscle weakness (generalized)    End of Session Activity Tolerance: Patient tolerated treatment well General Behavior During Session: [facility] for tasks performed Cognition: [facility] for tasks performed OT Plan of Care OT Home Exercise Plan: Gave patient green theraputty for HEP. OT Patient Instructions: verbalized Consulted and Agree with Plan of Care: Patient   Limmie Patricia, OTR/L  03/07/2013, 8:40 AM

## 2013-03-10 ENCOUNTER — Ambulatory Visit (HOSPITAL_COMMUNITY)
Admission: RE | Admit: 2013-03-10 | Discharge: 2013-03-10 | Disposition: A | Discharge status: Home / Self Care | Payer: 59 | Source: Ambulatory Visit | Attending: Family Medicine | Admitting: Family Medicine

## 2013-03-10 ENCOUNTER — Ambulatory Visit: Payer: 59

## 2013-03-10 NOTE — Progress Notes (Signed)
Occupational Therapy Treatment Patient Details  Name: Patrick Moyer MRN: [id number] Date of Birth: Apr 30, 1951  Today's Date: 03/10/2013 Time: 0801-0829 OT Time Calculation (min): 28 min MFR 801-809 8' Therex 809-829 20'  Visit#: 6 of 16  Re-eval: 03/19/13    Authorization: [employer]  Authorization Time Period:    Authorization Visit#:   of    Subjective Symptoms/Limitations Symptoms: S: I didn't go dancing but I did a lot of yard work. Pain Assessment Currently in Pain?: Yes Pain Score:   1 Pain Location: Wrist Pain Orientation: Right Pain Type: Acute pain  Precautions/Restrictions  Precautions Precautions: None  Exercise/Treatments Wrist Exercises Forearm Supination: PROM;10 reps;Strengthening;Bar weights/barbell Bar Weights/Barbell (Forearm Supination): 2 lbs Forearm Pronation: PROM;10 reps;Strengthening;Bar weights/barbell Bar Weights/Barbell (Forearm Pronation): 2 lbs Wrist Flexion: PROM;10 reps;Strengthening;Bar weights/barbell Bar Weights/Barbell (Wrist Flexion): 2 lbs Wrist Extension: PROM;10 reps;Strengthening;Bar weights/barbell Bar Weights/Barbell (Wrist Extension): 2 lbs Wrist Radial Deviation: PROM;10 reps;Strengthening;Bar weights/barbell (12X 2#) Wrist Ulnar Deviation: PROM;10 reps;Strengthening;Bar weights/barbell (12X 2#)      Wrist Weighted Stretch Wrist Flexion - Weighted Stretch: 2 pounds;60 seconds Wrist Extension - Weighted Stretch: 2 pounds;60 seconds Radial Deviation - Weighted Stretch: 2 pounds;60 seconds      Manual Therapy Manual Therapy: Myofascial release Myofascial Release: MFR to flexor and extensor forearm, wrist, and hand to decrease pain and fascial restrictions and increase pain free mobility. Passive stretching into flexion and extension while completing MFR. Gentle joint mobs to wrist and hand.  Occupational Therapy Assessment and Plan OT Assessment and Plan Clinical Impression Statement: A: Added 2# to weighted exercises.  Tolerated well.  OT Plan: P: Give theraband HEP for wrist flexion/extention/pronation/supination/radial and ulnar deviation.   Goals Short Term Goals Time to Complete Short Term Goals: 4 weeks Short Term Goal 1: Patient will be educated on HEP. Short Term Goal 2: Patient will increase pinch strength by 5# to be able to write with decreased difficutly. Short Term Goal 2 Progress: Progressing toward goal Short Term Goal 3: Patient will decrease pain level to 4/10 when line dancing. Short Term Goal 3 Progress: Progressing toward goal Short Term Goal 4: Patient will decrease edema in wrist by .5 cm to allow for greater mobility and functional use of his right hand.   Short Term Goal 4 Progress: Progressing toward goal Short Term Goal 5: Patient will decrease fascial restrictions in right hand from min to trace. Long Term Goals Time to Complete Long Term Goals: 8 weeks Long Term Goal 1: Patient will return to highest level of independence with I/BADL, work, and leisure activities.  Long Term Goal 1 Progress: Progressing toward goal Long Term Goal 2: Patient will increase pinch strength by 10# to be able to write with decreased difficulty. Long Term Goal 2 Progress: Progressing toward goal Long Term Goal 3: Patient will decrease pain level to 1/10 when line dancing. Long Term Goal 3 Progress: Progressing toward goal Long Term Goal 4: Patient will increase strength in LUE wrist and hand to 5/5. Long Term Goal 4 Progress: Progressing toward goal Long Term Goal 5: Patient will decrease fascial restrictions in right hand from trace to zero. Long Term Goal 5 Progress: Progressing toward goal  Problem List Patient Active Problem List  Diagnosis  . Allergic rhinitis due to pollen  . 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 Obstructive sleep apnea  . Positive TB test  . Abnormal finding on chest xray  . Malabsorption  . Pain in joint, hand  . Muscle weakness (generalized)  End of  Session Activity Tolerance: Patient tolerated treatment well General Behavior During Session: [facility] for tasks performed Cognition: [facility] for tasks performed   Limmie Patricia, OTR/L  03/10/2013, 8:34 AM

## 2013-03-11 ENCOUNTER — Ambulatory Visit: Payer: 59

## 2013-03-12 ENCOUNTER — Ambulatory Visit (HOSPITAL_COMMUNITY)
Admission: RE | Admit: 2013-03-12 | Discharge: 2013-03-12 | Disposition: A | Discharge status: Home / Self Care | Payer: 59 | Source: Ambulatory Visit | Attending: Family Medicine | Admitting: Family Medicine

## 2013-03-12 ENCOUNTER — Ambulatory Visit (INDEPENDENT_AMBULATORY_CARE_PROVIDER_SITE_OTHER): Payer: 59

## 2013-03-12 DIAGNOSIS — J309 Allergic rhinitis, unspecified: Secondary | ICD-10-CM

## 2013-03-12 NOTE — Progress Notes (Signed)
Occupational Therapy Treatment Patient Details  Name: Patrick Moyer MRN: [id number] Date of Birth: 10/30/1951  Today's Date: 03/12/2013 Time: 3086-5784 OT Time Calculation (min): 32 min Manual Therapy 696-295 18' Therapeutic Exercises [phone] 14' Visit#: 7 of 16  Re-eval: 03/19/13    Subjective S:  I havent been doing my exercises as much.   Pain Assessment Currently in Pain?: Yes Pain Score:   1 Pain Location: Wrist  Precautions/Restrictions   n/a  Exercise/Treatments Wrist Exercises Other wrist exercises: in modified push up positon with hands on flat surface of  Bosu ball balanced x 15 seconds maintaining equal weight distribution between his hands Other wrist exercises: graduated walk up pushup onto ball portion of Bosu ball 3 total repetitions   Theraputty: Flatten;Roll (twist) Theraputty - Flatten: blue Theraputty - Roll: blue         Manual Therapy Manual Therapy: Myofascial release Myofascial Release: Myofascial release to flexor and extensor forearm, wrist, and hand to decrease pain and fascial restrictions and increase pain free mobility.  Passive stretching   Occupational Therapy Assessment and Plan OT Assessment and Plan Clinical Impression Statement: A:  increased to max blue resist theraputty for grip and wrist strengthening this date and added to patient's HEP.  Added wrist strengthening/stability exercises with Bosu ball, which were quite challenging for the patient to complete. OT Plan: P:  Add tband exercises for wrist strengthening and grip strengthening with hand gripper.   Goals Short Term Goals Time to Complete Short Term Goals: 4 weeks Short Term Goal 1: Patient will be educated on HEP. Short Term Goal 2: Patient will increase pinch strength by 5# to be able to write with decreased difficutly. Short Term Goal 2 Progress: Progressing toward goal Short Term Goal 3: Patient will decrease pain level to 4/10 when line dancing. Short Term Goal 3  Progress: Progressing toward goal Short Term Goal 4: Patient will decrease edema in wrist by .5 cm to allow for greater mobility and functional use of his right hand.   Short Term Goal 4 Progress: Progressing toward goal Short Term Goal 5: Patient will decrease fascial restrictions in right hand from min to trace. Short Term Goal 5 Progress: Progressing toward goal Long Term Goals Time to Complete Long Term Goals: 8 weeks Long Term Goal 1: Patient will return to highest level of independence with I/BADL, work, and leisure activities.  Long Term Goal 1 Progress: Progressing toward goal Long Term Goal 2: Patient will increase pinch strength by 10# to be able to write with decreased difficulty. Long Term Goal 2 Progress: Progressing toward goal Long Term Goal 3: Patient will decrease pain level to 1/10 when line dancing. Long Term Goal 3 Progress: Progressing toward goal Long Term Goal 4: Patient will increase strength in LUE wrist and hand to 5/5. Long Term Goal 4 Progress: Progressing toward goal Long Term Goal 5: Patient will decrease fascial restrictions in right hand from trace to zero. Long Term Goal 5 Progress: Progressing toward goal  Problem List Patient Active Problem List  Diagnosis  . Allergic rhinitis due to pollen  . 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 Obstructive sleep apnea  . Positive TB test  . Abnormal finding on chest xray  . Malabsorption  . Pain in joint, hand  . Muscle weakness (generalized)    End of Session Activity Tolerance: Patient tolerated treatment well General Behavior During Session: [facility] for tasks performed Cognition: [facility] for tasks performed OT Plan of Care OT Home Exercise Plan:  Issued blue theraputty for grip and wrist strengthening  Shirlean Mylar, OTR/L  03/12/2013, 11:34 AM

## 2013-03-13 ENCOUNTER — Ambulatory Visit (HOSPITAL_COMMUNITY): Payer: 59

## 2013-03-17 ENCOUNTER — Ambulatory Visit: Payer: 59

## 2013-03-17 ENCOUNTER — Ambulatory Visit (HOSPITAL_COMMUNITY)
Admission: RE | Admit: 2013-03-17 | Discharge: 2013-03-17 | Disposition: A | Discharge status: Home / Self Care | Payer: 59 | Source: Ambulatory Visit | Attending: Family Medicine | Admitting: Family Medicine

## 2013-03-17 NOTE — Progress Notes (Signed)
Occupational Therapy Treatment Patient Details  Name: Patrick Moyer MRN: [id number] Date of Birth: 25-Feb-1951  Today's Date: 03/17/2013 Time: 4098-1191 OT Time Calculation (min): 24 min Manual Therapy [phone] 14' Reassessment 820-830 10' Visit#: 8 of 16  Re-eval: 04/14/13    Authorization: UMR  Subjective:  S:  Im not quite where I want to be today.   Pain Assessment Currently in Pain?: Yes Pain Score:   1 Pain Location: Wrist Pain Orientation: Right Pain Type: Acute pain  Precautions/Restrictions   progress as tolerated  Exercise/Treatments    Manual Therapy Manual Therapy: Myofascial release Myofascial Release: Myofascial release to flexor and extensor forearm, wrist, and hand to decrease pain and fascial restrictions and increase pain free mobility. Passive stretching   Occupational Therapy Assessment and Plan OT Assessment and Plan Clinical Impression Statement: A:  Reassessed patient this date:  supination 88 5/5 (85 4/5), pronation 90 5/5 (90 4/5), wrist flexion 72 5/5 (50 4/5), wrist extension 58 5/5 (40 4/5), radial deviation 14 (14), ulnar deviation 30 (32), grip strength 108 pounds (106 pounds), lateral pinch strength 20 pounds vs 24 pounds in his left hand, 3 point pinch strength 20 pounds vs 22 pounds in his left.  Edema decreased by .5 cm.   OT Frequency: Min 2X/week OT Duration: 4 weeks OT Plan: P:  Continue 2 times a week x 4 weeks, working towards unmet short term and long term goals.     Goals Short Term Goals Time to Complete Short Term Goals: 4 weeks Short Term Goal 1: Patient will be educated on HEP. Short Term Goal 1 Progress: Met Short Term Goal 2: Patient will increase pinch strength by 5# to be able to write with decreased difficutly. Short Term Goal 2 Progress: Progressing toward goal Short Term Goal 3: Patient will decrease pain level to 4/10 when line dancing. Short Term Goal 3 Progress: Met Short Term Goal 4: Patient will decrease edema in  wrist by .5 cm to allow for greater mobility and functional use of his right hand.   Short Term Goal 4 Progress: Met Short Term Goal 5: Patient will decrease fascial restrictions in right hand from min to trace. Short Term Goal 5 Progress: Met Long Term Goals Time to Complete Long Term Goals: 8 weeks Long Term Goal 1: Patient will return to highest level of independence with I/BADL, work, and leisure activities.  Long Term Goal 1 Progress: Progressing toward goal Long Term Goal 2: Patient will increase pinch strength to 23# to be able to write with decreased difficulty. Long Term Goal 2 Progress: Revised (modified due to lack of progress/goal met) Long Term Goal 3: Patient will decrease pain level to 1/10 when line dancing. Long Term Goal 3 Progress: Progressing toward goal Long Term Goal 4: Patient will increase strength in LUE wrist and hand to 5/5. Long Term Goal 4 Progress: Met Long Term Goal 5: Patient will decrease fascial restrictions in right hand from trace to zero. Long Term Goal 5 Progress: Progressing toward goal  Problem List Patient Active Problem List  Diagnosis  . Allergic rhinitis due to pollen  . 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 Obstructive sleep apnea  . Positive TB test  . Abnormal finding on chest xray  . Malabsorption  . Pain in joint, hand  . Muscle weakness (generalized)    End of Session Activity Tolerance: Patient tolerated treatment well General Behavior During Session: [facility] for tasks performed Cognition: [facility] for tasks performed   Lattie Riege H.  Dayton Scrape, OTR/L  03/17/2013, 8:52 AM

## 2013-03-18 ENCOUNTER — Ambulatory Visit (INDEPENDENT_AMBULATORY_CARE_PROVIDER_SITE_OTHER): Payer: 59

## 2013-03-18 ENCOUNTER — Ambulatory Visit (INDEPENDENT_AMBULATORY_CARE_PROVIDER_SITE_OTHER): Payer: 59 | Admitting: Internal Medicine

## 2013-03-18 ENCOUNTER — Encounter: Payer: Self-pay | Admitting: Internal Medicine

## 2013-03-18 VITALS — BP 116/64 | HR 81 | Ht 76.0 in | Wt 188.8 lb

## 2013-03-18 DIAGNOSIS — G4733 Obstructive sleep apnea (adult) (pediatric): Secondary | ICD-10-CM

## 2013-03-18 DIAGNOSIS — J309 Allergic rhinitis, unspecified: Secondary | ICD-10-CM

## 2013-03-18 DIAGNOSIS — J301 Allergic rhinitis due to pollen: Secondary | ICD-10-CM

## 2013-03-18 MED ORDER — MONTELUKAST SODIUM 10 MG PO TABS: 10.0000 mg | ORAL_TABLET | Freq: Every day | ORAL | Status: DC

## 2013-03-18 MED ORDER — IPRATROPIUM BROMIDE 0.06 % NA SOLN: 2.0000 | Freq: Four times a day (QID) | NASAL | Status: DC

## 2013-03-18 NOTE — Progress Notes (Signed)
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 He reports a chronic history of allergic rhinitis, with allergy skin testing broadly positive in the past but never allergy vaccine. He moved here from [place] this year. There, he was adequately controlled with a steroid nasal spray, Mucinex and Zyrtec D. Symptoms worse in spring and fall. In [place] he is needing to have plain Zyrtec as an extra dose in the evenings. He wants to avoid allergy shots. He notices primarily rhinorrhea with less congestion and no wheeze or cough and no rash. There is no history of intolerance to latex, contrast, aspirin. Food allergies have been suspected, especially seasonings and cotton seed oil.Marland Kitchen He has not had insect stings. He has felt worse on work days and he says there has been obvious black mold in his building. There is a history of major renovation of abdomen because of mold and dampness problems. He is currently living in an apartment with some carpet and mostly vinyl floor. He denies problems there. He has no pets but did have a cath in the past. He reports intense local reaction to flu vaccine on more than one trial in the past. History of obstructive sleep apnea treated with BiPAP 11/6. We asked him to track down his original diagnostic report for our file. He never smoked, but chest x-ray in the last year showed overinflation and was interpreted as "COPD". He denies respiratory symptoms except as described above and wondered if the BiPAP caused overinflation. He is interested in evaluating with PFT.  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 He reports a chronic history of allergic rhinitis, with allergy skin testing broadly positive in the past but never allergy vaccine. Blames Soy for abd cramps, gas. Did have Nasal flu vaccine last Fall. Has not procured his old sleep study from [place].   Tested Normal in past for a1AT. Using BIPAP 11/6 regularly, based on testing in [place], but suspects the pressure may explain why his CXR's are read as over-inflated "?emphysema?" in recent years. A1AT tested normal. We discussed oral appliances as alternative to BiPAP. Significant increase in nasal congestion and nose blowing since off antihistamines for skin testing.  Allergy skin testing- broadly positive reactions for grass weed and tree pollens, dust mite, feathers and 1 mold. Isolated positive reaction on food testing for beef. He will watch this without restriction.  He wants allergy vaccine.  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 Using BiPAP 11/5 Advanced all night every night. His dentist, [facility], is starting to make oral appliances. Spring pollen has been unusually well tolerated and he credits his allergy vaccine which is still building. He has used Patanase and Veramyst nasal sprays with Zyrtec and Mucinex. He would like his nurse retrained to get his allergy shots so that he doesn't have to take time to come here and we discussed this. We reviewed previous imaging reports: Chest CT at [facility] 02/22/2010: Hyperinflation interpreted as chronic changes of COPD. Small lymph nodes including 5 mm noncalcified right middle lobe, 4 mm noncalcified right middle lobe, subpleural nodular densities in the apices. 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 Never smoked. PFT: 03/19/2012-scores are high normal. FEV1 4.96/129%, FEV1/FVC 0.81. FEF 25-75% 5.07/148% with 23% response to bronchodilator. TLC  119%, RV 113%, DLCO 130%. These indicate large lungs without the hyperinflation of emphysema suggested by radiologist interpretation of images.  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 We discussed expectations again. He is satisfied allergy vaccine is helping him. He is shifting to a [place] office but we discussed his plans to continue getting his vaccine injections here. Veramyst helps.. Using Zyrtec in the morning Zyrtec-D in the evening with patanase twice daily OSA doing well. Good compliance and control with BiPAP 11/5.  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 BiPAP 11/5- good compliance and control. Dymista nasal spray sample was no better than Patanase or Veramyst. He still complains of occasional dripping rhinorrhea.  ROS-see HPI Constitutional:   No-   weight loss, night sweats, fevers, chills, fatigue, lassitude. HEENT:   No-  headaches, difficulty swallowing, tooth/dental problems, sore throat,      + sneezing, itching, ear ache, nasal congestion,+ post nasal drip,  CV:  No-   chest pain, orthopnea, PND, swelling in lower extremities, anasarca, dizziness, palpitations Resp: No-   shortness of breath with exertion or at rest.              No-   productive cough,  No non-productive cough,  No- coughing up of blood.              No-   change in color of mucus.  No- wheezing.   Skin: No-   rash or lesions. GI:  No-   heartburn, indigestion, abdominal pain, nausea, vomiting, GU: . MS:  No-   joint pain or swelling.   Neuro-     nothing unusual Psych:  No- change in mood or affect. No depression or anxiety.  No memory loss.  OBJ General- Alert, Oriented, Affect-appropriate, Distress- none acute; tall slender man Skin- rash-none, lesions- none, excoriation- none Lymphadenopathy- none Head- atraumatic            Eyes- Gross vision intact, PERRLA, conjunctivae clear secretions            Ears-  Hearing, canals-normal            Nose- narrow with mucus bridging, +Septal dev and external nose (hx fx), mucus, No-polyps, erosion, perforation.             Throat- Mallampati II , mucosa clear , drainage- none, tonsils- atrophic Neck- flexible , trachea midline, no stridor , thyroid nl, carotid no bruit Chest - symmetrical excursion , unlabored           Heart/CV- RRR , no murmur , no gallop  , no rub, nl s1 s2                           - JVD- none , edema- none, stasis changes- none, varices- none           Lung- clear to P&A, wheeze- none, cough- none , dullness-none, rub- none           Chest wall-  Abd-  Br/ Gen/ Rectal- Not done, not indicated Extrem- cyanosis- none, clubbing, none, atrophy- none, strength- nl Neuro- grossly intact to observation

## 2013-03-18 NOTE — Patient Instructions (Addendum)
I will have the allergy lab advance your vaccine strength to 1:2 with next order  Script sent for ipratropium nasal spray to use as needed for wet/ runny nose  Script sent for Singulair/ montelukast to try as an anti-inflammatory   Try an artificial tear like Systain, Lacrilube

## 2013-03-20 ENCOUNTER — Ambulatory Visit (HOSPITAL_COMMUNITY): Payer: 59 | Admitting: Specialist

## 2013-03-23 NOTE — Assessment & Plan Note (Signed)
Probably allergic rhinitis, allergic conjunctivitis and vasomotor rhinitis. Plan-after discussion, we are advancing allergy vaccine concentration to 1: 2 Add Singulair And sample ipratropium nasal spray

## 2013-03-23 NOTE — Assessment & Plan Note (Signed)
Continued good compliance and control

## 2013-03-24 ENCOUNTER — Ambulatory Visit (HOSPITAL_COMMUNITY)
Admission: RE | Admit: 2013-03-24 | Discharge: 2013-03-24 | Disposition: A | Discharge status: Home / Self Care | Payer: 59 | Source: Ambulatory Visit | Attending: Family Medicine | Admitting: Family Medicine

## 2013-03-24 NOTE — Progress Notes (Signed)
Occupational Therapy Treatment Patient Details  Name: Patrick Moyer MRN: [id number] Date of Birth: 12/24/1951  Today's Date: 03/24/2013 Time: 7846-9629 OT Time Calculation (min): 31 min Manual Therapy [phone] 14' Therapeutic Exercises [phone] 17' Visit#: 9 of 16  Re-eval: 04/14/13     Subjective S:  I was doing side planks and planks today and my wrist didnt even hurt. Pain Assessment Currently in Pain?: No/denies Pain Score: 0-No pain  Precautions/Restrictions   n/a  Exercise/Treatments Wrist Exercises Sponges: 21 Theraputty: Flatten;Roll Theraputty - Flatten: blue Theraputty - Roll: blue  Hand Gripper with Small Beads: 10 small and 6 extra small  Manual Therapy Manual Therapy: Myofascial release Myofascial Release: Myofascial release to flexor and extensor forearm, wrist, and hand to decrease pain and fascial restrictions and increase pain free mobility. Passive stretching   Added MFR to scaphoid area.  Occupational Therapy Assessment and Plan OT Assessment and Plan Clinical Impression Statement: A: Pain level decreased this date with functional activities. Added sponge gripping actiivity and gripping of small and extra small beads OT Plan: P:  Add additional grip strengthening exercises, add plank for  30".   Goals Short Term Goals Time to Complete Short Term Goals: 4 weeks Short Term Goal 1: Patient will be educated on HEP. Short Term Goal 2: Patient will increase pinch strength by 5# to be able to write with decreased difficutly. Short Term Goal 2 Progress: Progressing toward goal Short Term Goal 3: Patient will decrease pain level to 4/10 when line dancing. Short Term Goal 4: Patient will decrease edema in wrist by .5 cm to allow for greater mobility and functional use of his right hand.   Short Term Goal 5: Patient will decrease fascial restrictions in right hand from min to trace. Long Term Goals Time to Complete Long Term Goals: 8 weeks Long Term Goal 1:  Patient will return to highest level of independence with I/BADL, work, and leisure activities.  Long Term Goal 1 Progress: Progressing toward goal Long Term Goal 2: Patient will increase pinch strength by 10# to be able to write with decreased difficulty. Long Term Goal 2 Progress: Progressing toward goal Long Term Goal 3: Patient will decrease pain level to 1/10 when line dancing. Long Term Goal 4: Patient will increase strength in LUE wrist and hand to 5/5. Long Term Goal 5: Patient will decrease fascial restrictions in right hand from trace to zero. Long Term Goal 5 Progress: Progressing toward goal  Problem List Patient Active Problem List  Diagnosis  . Allergic rhinitis due to pollen  . 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 Obstructive sleep apnea  . Positive TB test  . Abnormal finding on chest xray  . Malabsorption  . Pain in joint, hand  . Muscle weakness (generalized)    End of Session Activity Tolerance: Patient tolerated treatment well General Behavior During Session: [facility] for tasks performed Cognition: [facility] for tasks performed  GO    Shirlean Mylar, OTR/L  03/24/2013, 8:46 AM

## 2013-03-27 ENCOUNTER — Ambulatory Visit (HOSPITAL_COMMUNITY)
Admission: RE | Admit: 2013-03-27 | Discharge: 2013-03-27 | Disposition: A | Discharge status: Home / Self Care | Payer: 59 | Source: Ambulatory Visit | Attending: Family Medicine | Admitting: Family Medicine

## 2013-03-27 ENCOUNTER — Ambulatory Visit (INDEPENDENT_AMBULATORY_CARE_PROVIDER_SITE_OTHER): Payer: 59

## 2013-03-27 DIAGNOSIS — J309 Allergic rhinitis, unspecified: Secondary | ICD-10-CM

## 2013-03-27 DIAGNOSIS — M6281 Muscle weakness (generalized): Secondary | ICD-10-CM | POA: Insufficient documentation

## 2013-03-27 DIAGNOSIS — M25639 Stiffness of unspecified wrist, not elsewhere classified: Secondary | ICD-10-CM | POA: Insufficient documentation

## 2013-03-27 DIAGNOSIS — IMO0001 Reserved for inherently not codable concepts without codable children: Secondary | ICD-10-CM | POA: Insufficient documentation

## 2013-03-27 DIAGNOSIS — G4733 Obstructive sleep apnea (adult) (pediatric): Secondary | ICD-10-CM | POA: Insufficient documentation

## 2013-03-27 DIAGNOSIS — M25549 Pain in joints of unspecified hand: Secondary | ICD-10-CM | POA: Insufficient documentation

## 2013-03-27 NOTE — Progress Notes (Signed)
Occupational Therapy Treatment Patient Details  Name: Patrick Moyer MRN: [id number] Date of Birth: 05-29-1951  Today's Date: 03/27/2013 Time: 0815-0830 OT Time Calculation (min): 15 min Manual Therapy 15' Visit#: 10 of 16  Re-eval: 04/14/13    Authorization: UMR   Subjective S:  I think I am doing pretty good with extension, I want about 10 degrees more flexion. Pain Assessment Currently in Pain?: No/denies Pain Score: 0-No pain  Precautions/Restrictions   n/a  Exercise/Treatments Manual Therapy Manual Therapy: Myofascial release Myofascial Release: Myofascial release to flexor and extensor forearm, wrist, and hand to decrease pain and fascial restrictions and increase pain free mobility. Passive stretching Added MFR to scaphoid area.  gentle joint mobilizations to extensor wrist region.    Occupational Therapy Assessment and Plan OT Assessment and Plan Clinical Impression Statement: A:  Treatment tolerated well.   OT Plan: P:  Add additional grip strengthening exercises, add plank for  30".   Goals Short Term Goals Time to Complete Short Term Goals: 4 weeks Short Term Goal 1: Patient will be educated on HEP. Short Term Goal 2: Patient will increase pinch strength by 5# to be able to write with decreased difficutly. Short Term Goal 3: Patient will decrease pain level to 4/10 when line dancing. Short Term Goal 4: Patient will decrease edema in wrist by .5 cm to allow for greater mobility and functional use of his right hand.   Short Term Goal 5: Patient will decrease fascial restrictions in right hand from min to trace. Long Term Goals Time to Complete Long Term Goals: 8 weeks Long Term Goal 1: Patient will return to highest level of independence with I/BADL, work, and leisure activities.  Long Term Goal 2: Patient will increase pinch strength by 10# to be able to write with decreased difficulty. Long Term Goal 3: Patient will decrease pain level to 1/10 when line  dancing. Long Term Goal 4: Patient will increase strength in LUE wrist and hand to 5/5. Long Term Goal 5: Patient will decrease fascial restrictions in right hand from trace to zero.  Problem List Patient Active Problem List  Diagnosis  . Allergic rhinitis due to pollen  . 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 Obstructive sleep apnea  . Positive TB test  . Abnormal finding on chest xray  . Malabsorption  . Pain in joint, hand  . Muscle weakness (generalized)    End of Session Activity Tolerance: Patient tolerated treatment well General Behavior During Session: [facility] for tasks performed Cognition: [facility] for tasks performed  GO    Shirlean Mylar, OTR/L  03/27/2013, 8:41 AM

## 2013-03-28 ENCOUNTER — Ambulatory Visit (INDEPENDENT_AMBULATORY_CARE_PROVIDER_SITE_OTHER): Payer: 59

## 2013-03-28 DIAGNOSIS — J309 Allergic rhinitis, unspecified: Secondary | ICD-10-CM

## 2013-04-01 ENCOUNTER — Telehealth: Payer: Self-pay | Admitting: Internal Medicine

## 2013-04-01 MED ORDER — CETIRIZINE HCL 10 MG PO TABS: 10.0000 mg | ORAL_TABLET | Freq: Every evening | ORAL | Status: DC

## 2013-04-01 NOTE — Telephone Encounter (Signed)
Rx was sent to [facility] with pt and notified of this  Nothing further needed per pt

## 2013-04-03 ENCOUNTER — Ambulatory Visit (HOSPITAL_COMMUNITY): Payer: 59 | Admitting: Specialist

## 2013-04-03 ENCOUNTER — Ambulatory Visit (INDEPENDENT_AMBULATORY_CARE_PROVIDER_SITE_OTHER): Payer: 59

## 2013-04-03 DIAGNOSIS — J309 Allergic rhinitis, unspecified: Secondary | ICD-10-CM

## 2013-04-07 ENCOUNTER — Ambulatory Visit (INDEPENDENT_AMBULATORY_CARE_PROVIDER_SITE_OTHER): Payer: 59

## 2013-04-07 ENCOUNTER — Ambulatory Visit (HOSPITAL_COMMUNITY)
Admission: RE | Admit: 2013-04-07 | Discharge: 2013-04-07 | Disposition: A | Discharge status: Home / Self Care | Payer: 59 | Source: Ambulatory Visit | Attending: Family Medicine | Admitting: Family Medicine

## 2013-04-07 DIAGNOSIS — J309 Allergic rhinitis, unspecified: Secondary | ICD-10-CM

## 2013-04-07 NOTE — Progress Notes (Signed)
Occupational Therapy Treatment Patient Details  Name: Patrick Moyer MRN: [id number] Date of Birth: 1951-07-15  Today's Date: 04/07/2013 Time: 1478-2956 OT Time Calculation (min): 18 min Manual therapy 18' Visit#: 11 of 16  Re-eval: 04/14/13    Authorization: UMR  Authorization Time Period:    Authorization Visit#:   of    Subjective S:  I still cant flex this wrist as far as the other.  (Discussed the pathology of past fracture effecting potential for full ROM in his wrist, also discussed viewing wrist functionally vs gaining full ROM) Pain Assessment Currently in Pain?: No/denies Pain Score: 0-No pain   Exercise/Treatments    Manual Therapy Manual Therapy: Myofascial release Myofascial Release: Myofascial release to flexor and extensor forearm, wrist, and hand to decrease pain and fascial restrictions and increase pain free mobility. Passive stretching Added MFR to scaphoid area. gentle joint mobilizations to extensor wrist region.  Occupational Therapy Assessment and Plan OT Assessment and Plan Clinical Impression Statement: A:  Trace fascial restrictions noted this date in wrist.   OT Plan: P:  reassess for possible dc.   Goals Short Term Goals Time to Complete Short Term Goals: 4 weeks Short Term Goal 1: Patient will be educated on HEP. Short Term Goal 2: Patient will increase pinch strength by 5# to be able to write with decreased difficutly. Short Term Goal 2 Progress: Progressing toward goal Short Term Goal 3: Patient will decrease pain level to 4/10 when line dancing. Short Term Goal 4: Patient will decrease edema in wrist by .5 cm to allow for greater mobility and functional use of his right hand.   Short Term Goal 5: Patient will decrease fascial restrictions in right hand from min to trace. Long Term Goals Time to Complete Long Term Goals: 8 weeks Long Term Goal 1: Patient will return to highest level of independence with I/BADL, work, and leisure activities.   Long Term Goal 1 Progress: Progressing toward goal Long Term Goal 2: Patient will increase pinch strength by 10# to be able to write with decreased difficulty. Long Term Goal 2 Progress: Progressing toward goal Long Term Goal 3: Patient will decrease pain level to 1/10 when line dancing. Long Term Goal 3 Progress: Progressing toward goal Long Term Goal 4: Patient will increase strength in LUE wrist and hand to 5/5. Long Term Goal 5: Patient will decrease fascial restrictions in right hand from trace to zero. Long Term Goal 5 Progress: Progressing toward goal  Problem List Patient Active Problem List  Diagnosis  . Allergic rhinitis due to pollen  . 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 Obstructive sleep apnea  . Positive TB test  . Abnormal finding on chest xray  . Malabsorption  . Pain in joint, hand  . Muscle weakness (generalized)    End of Session Activity Tolerance: Patient tolerated treatment well General Behavior During Session: [facility] for tasks performed Cognition: [facility] for tasks performed  GO    Jacqualine Code 04/07/2013, 8:42 AM

## 2013-04-10 ENCOUNTER — Ambulatory Visit (HOSPITAL_COMMUNITY): Payer: 59 | Admitting: Specialist

## 2013-04-14 ENCOUNTER — Ambulatory Visit (HOSPITAL_COMMUNITY)
Admission: RE | Admit: 2013-04-14 | Discharge: 2013-04-14 | Disposition: A | Discharge status: Home / Self Care | Payer: 59 | Source: Ambulatory Visit | Attending: Family Medicine | Admitting: Family Medicine

## 2013-04-14 ENCOUNTER — Ambulatory Visit (INDEPENDENT_AMBULATORY_CARE_PROVIDER_SITE_OTHER): Payer: 59

## 2013-04-14 DIAGNOSIS — J309 Allergic rhinitis, unspecified: Secondary | ICD-10-CM

## 2013-04-14 NOTE — Progress Notes (Signed)
Occupational Therapy Treatment Patient Details  Name: Patrick Moyer MRN: [id number] Date of Birth: 1951/05/13  Today's Date: 04/14/2013 Time: 2841-3244 OT Time Calculation (min): 16 min Manual Therapy 010-272 11' Reassessment 825-830 5' Visit#: 12 of 16  Re-eval: 04/14/13     Subjective S:  I havent been doing my exercises as much as I should. Pain Assessment Currently in Pain?: No/denies Pain Score: 0-No pain Pain Location: Wrist  Precautions/Restrictions   n/a  Exercise/Treatments  Manual Therapy Manual Therapy: Myofascial release Myofascial Release: Myofascial release to flexor and extensor forearm, wrist, and hand to decrease pain and fascial restrictions and increase pain free mobility. Passive stretching Added MFR to scaphoid area. gentle joint mobilizations to extensor wrist region.  Occupational Therapy Assessment and Plan OT Assessment and Plan Clinical Impression Statement: A:  Reassessed for discharge this date.  Current (03/17/13)  supination 89 5/5 (88 5/5), pronation 86 5/5 (90 5/5), wrist flexion 76 5/5 (72 5/5), wrist extension 70 5/5 (58 5/5), grip strength 94 pounds (108 pounds), lateral pinch strength 21 pounds (20 pounds).  He has met or partly met all OT goals and is independent with his HEP.  DC from skilled OT services this date.   OT Plan: P:  DC this date.   Goals Short Term Goals Time to Complete Short Term Goals: 4 weeks Short Term Goal 1: Patient will be educated on HEP. Short Term Goal 2: Patient will increase pinch strength by 5# to be able to write with decreased difficutly. Short Term Goal 2 Progress: Partly met Short Term Goal 3: Patient will decrease pain level to 4/10 when line dancing. Short Term Goal 4: Patient will decrease edema in wrist by .5 cm to allow for greater mobility and functional use of his right hand.   Short Term Goal 5: Patient will decrease fascial restrictions in right hand from min to trace. Long Term Goals Time to  Complete Long Term Goals: 8 weeks Long Term Goal 1: Patient will return to highest level of independence with I/BADL, work, and leisure activities.  Long Term Goal 1 Progress: Met Long Term Goal 2: Patient will increase pinch strength by 10# to be able to write with decreased difficulty. Long Term Goal 2 Progress: Partly met Long Term Goal 3: Patient will decrease pain level to 1/10 when line dancing. Long Term Goal 3 Progress: Met Long Term Goal 4: Patient will increase strength in LUE wrist and hand to 5/5. Long Term Goal 4 Progress: Met Long Term Goal 5: Patient will decrease fascial restrictions in right hand from trace to zero. Long Term Goal 5 Progress: Met  Problem List Patient Active Problem List  Diagnosis  . Allergic rhinitis due to pollen  . 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 Obstructive sleep apnea  . Positive TB test  . Abnormal finding on chest xray  . Malabsorption  . Pain in joint, hand  . Muscle weakness (generalized)    End of Session Activity Tolerance: Patient tolerated treatment well General Behavior During Therapy: [facility] for tasks assessed/performed Cognition: [facility] for tasks performed OT Plan of Care OT Home Exercise Plan: Reviewed HEP that consists of grip and wrist strengthening, yoga and stertching.   GO    Shirlean Mylar, OTR/L  04/14/2013, 9:46 AM

## 2013-04-17 ENCOUNTER — Ambulatory Visit (HOSPITAL_COMMUNITY): Payer: 59 | Admitting: Specialist

## 2013-04-22 ENCOUNTER — Telehealth: Payer: Self-pay | Admitting: Internal Medicine

## 2013-04-22 ENCOUNTER — Ambulatory Visit (INDEPENDENT_AMBULATORY_CARE_PROVIDER_SITE_OTHER): Payer: 59

## 2013-04-22 DIAGNOSIS — J309 Allergic rhinitis, unspecified: Secondary | ICD-10-CM

## 2013-04-22 NOTE — Telephone Encounter (Signed)
CY has left for the day Called spoke with Dr Dan Humphreys, advised him of the above and asked if any additional information can be relayed prior to routing message to [facility] Dr Dan Humphreys requesting to speak to [facility] personally at the number provided above; ok with a call back tomorrow

## 2013-04-23 ENCOUNTER — Encounter: Payer: Self-pay | Admitting: Internal Medicine

## 2013-04-23 NOTE — Telephone Encounter (Signed)
CY, please advise. I believe you tried calling him today based on his phone note however patient sent this e-mail with more detail. I sent an e-mail back stating I would get message to you and get back with patient as soon as possible. Thanks.

## 2013-04-28 NOTE — Telephone Encounter (Signed)
Has this been taken care of? Is there anything else that needs to be done with this message. Carron Curie, CMA

## 2013-04-29 ENCOUNTER — Ambulatory Visit: Payer: 59

## 2013-04-30 ENCOUNTER — Ambulatory Visit (INDEPENDENT_AMBULATORY_CARE_PROVIDER_SITE_OTHER): Payer: 59

## 2013-04-30 DIAGNOSIS — J309 Allergic rhinitis, unspecified: Secondary | ICD-10-CM

## 2013-05-01 NOTE — Telephone Encounter (Signed)
May triage close this message?  Thanks.

## 2013-05-05 NOTE — Telephone Encounter (Signed)
CY spoke with patient and letter completed.

## 2013-05-05 NOTE — Telephone Encounter (Signed)
Please advise if triage may close this message.  Thanks.

## 2013-05-12 ENCOUNTER — Encounter: Payer: Self-pay | Admitting: Internal Medicine

## 2013-05-13 ENCOUNTER — Telehealth: Payer: Self-pay | Admitting: Internal Medicine

## 2013-05-13 ENCOUNTER — Ambulatory Visit (INDEPENDENT_AMBULATORY_CARE_PROVIDER_SITE_OTHER): Payer: 59

## 2013-05-13 DIAGNOSIS — J309 Allergic rhinitis, unspecified: Secondary | ICD-10-CM

## 2013-05-13 MED ORDER — CETIRIZINE-PSEUDOEPHEDRINE ER 5-120 MG PO TB12: 1.0000 | ORAL_TABLET | ORAL | Status: DC

## 2013-05-13 NOTE — Telephone Encounter (Signed)
Will forward to Dr. Young as an FYI 

## 2013-05-13 NOTE — Telephone Encounter (Signed)
lmtcb x1 for pt. rx has been sent 

## 2013-05-13 NOTE — Telephone Encounter (Signed)
Pt returned triage's call.  Advised pt that CY did get the records from [place].  Advised that Rx for Zyrtec D was called into [facility] on [address].  Pt verbalized understanding.  Pt would like CY to know that he will be bringing in a disk of a CT to compare w/ the one from 2011.  Pt states no reason for a returned call-this was FYI only.  Patrick Moyer

## 2013-05-13 NOTE — Telephone Encounter (Signed)
I spoke with pt and he is requesting a  Refill on zyrtec D. Also he is wanting to know if Dr. Maple Hudson has received records from [facility] that was requested last week. Please advise thanks  Pt last OV 03/18/13 Pending 07/04/13

## 2013-05-13 NOTE — Telephone Encounter (Signed)
Per CY-yes he did get the records from [place] and okay to refill Zyrtec D as requested.

## 2013-05-20 ENCOUNTER — Ambulatory Visit (INDEPENDENT_AMBULATORY_CARE_PROVIDER_SITE_OTHER): Payer: 59

## 2013-05-20 DIAGNOSIS — J309 Allergic rhinitis, unspecified: Secondary | ICD-10-CM

## 2013-05-26 ENCOUNTER — Ambulatory Visit (INDEPENDENT_AMBULATORY_CARE_PROVIDER_SITE_OTHER): Payer: 59

## 2013-05-26 DIAGNOSIS — J309 Allergic rhinitis, unspecified: Secondary | ICD-10-CM

## 2013-06-03 ENCOUNTER — Ambulatory Visit (INDEPENDENT_AMBULATORY_CARE_PROVIDER_SITE_OTHER): Payer: 59

## 2013-06-03 DIAGNOSIS — J309 Allergic rhinitis, unspecified: Secondary | ICD-10-CM

## 2013-06-13 ENCOUNTER — Ambulatory Visit (INDEPENDENT_AMBULATORY_CARE_PROVIDER_SITE_OTHER): Payer: 59

## 2013-06-13 DIAGNOSIS — J309 Allergic rhinitis, unspecified: Secondary | ICD-10-CM

## 2013-06-17 ENCOUNTER — Ambulatory Visit (INDEPENDENT_AMBULATORY_CARE_PROVIDER_SITE_OTHER): Payer: 59

## 2013-06-17 DIAGNOSIS — J309 Allergic rhinitis, unspecified: Secondary | ICD-10-CM

## 2013-06-20 ENCOUNTER — Ambulatory Visit: Payer: 59

## 2013-06-30 ENCOUNTER — Other Ambulatory Visit: Payer: Self-pay | Admitting: Internal Medicine

## 2013-07-02 ENCOUNTER — Ambulatory Visit (INDEPENDENT_AMBULATORY_CARE_PROVIDER_SITE_OTHER): Payer: 59

## 2013-07-02 DIAGNOSIS — J309 Allergic rhinitis, unspecified: Secondary | ICD-10-CM

## 2013-07-04 ENCOUNTER — Encounter: Payer: Self-pay | Admitting: Internal Medicine

## 2013-07-04 ENCOUNTER — Ambulatory Visit (INDEPENDENT_AMBULATORY_CARE_PROVIDER_SITE_OTHER): Payer: 59 | Admitting: Internal Medicine

## 2013-07-04 VITALS — BP 112/64 | HR 71 | Ht 76.0 in | Wt 190.8 lb

## 2013-07-04 DIAGNOSIS — G4733 Obstructive sleep apnea (adult) (pediatric): Secondary | ICD-10-CM

## 2013-07-04 DIAGNOSIS — J301 Allergic rhinitis due to pollen: Secondary | ICD-10-CM

## 2013-07-04 DIAGNOSIS — R7611 Nonspecific reaction to tuberculin skin test without active tuberculosis: Secondary | ICD-10-CM

## 2013-07-04 NOTE — Progress Notes (Signed)
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 He reports a chronic history of allergic rhinitis, with allergy skin testing broadly positive in the past but never allergy vaccine. He moved here from [place] this year. There, he was adequately controlled with a steroid nasal spray, Mucinex and Zyrtec D. Symptoms worse in spring and fall. In [place] he is needing to have plain Zyrtec as an extra dose in the evenings. He wants to avoid allergy shots. He notices primarily rhinorrhea with less congestion and no wheeze or cough and no rash. There is no history of intolerance to latex, contrast, aspirin. Food allergies have been suspected, especially seasonings and cotton seed oil.Marland Kitchen He has not had insect stings. He has felt worse on work days and he says there has been obvious black mold in his building. There is a history of major renovation of abdomen because of mold and dampness problems. He is currently living in an apartment with some carpet and mostly vinyl floor. He denies problems there. He has no pets but did have a cath in the past. He reports intense local reaction to flu vaccine on more than one trial in the past. History of obstructive sleep apnea treated with BiPAP 11/6. We asked him to track down his original diagnostic report for our file. He never smoked, but chest x-ray in the last year showed overinflation and was interpreted as "COPD". He denies respiratory symptoms except as described above and wondered if the BiPAP caused overinflation. He is interested in evaluating with PFT.  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 He reports a chronic history of allergic rhinitis, with allergy skin testing broadly positive in the past but never allergy vaccine. Blames Soy for abd cramps, gas. Did have Nasal flu vaccine last Fall. Has not procured his old sleep study from [place].   Tested Normal in past for a1AT. Using BIPAP 11/6 regularly, based on testing in [place], but suspects the pressure may explain why his CXR's are read as over-inflated "?emphysema?" in recent years. A1AT tested normal. We discussed oral appliances as alternative to BiPAP. Significant increase in nasal congestion and nose blowing since off antihistamines for skin testing.  Allergy skin testing- broadly positive reactions for grass weed and tree pollens, dust mite, feathers and 1 mold. Isolated positive reaction on food testing for beef. He will watch this without restriction.  He wants allergy vaccine.  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 Using BiPAP 11/5 Advanced all night every night. His dentist, [facility], is starting to make oral appliances. Spring pollen has been unusually well tolerated and he credits his allergy vaccine which is still building. He has used Patanase and Veramyst nasal sprays with Zyrtec and Mucinex. He would like his nurse retrained to get his allergy shots so that he doesn't have to take time to come here and we discussed this. We reviewed previous imaging reports: Chest CT at [facility] 02/22/2010: Hyperinflation interpreted as chronic changes of COPD. Small lymph nodes including 5 mm noncalcified right middle lobe, 4 mm noncalcified right middle lobe, subpleural nodular densities in the apices. 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 Never smoked. PFT: 03/19/2012-scores are high normal. FEV1 4.96/129%, FEV1/FVC 0.81. FEF 25-75% 5.07/148% with 23% response to bronchodilator. TLC  119%, RV 113%, DLCO 130%. These indicate large lungs without the hyperinflation of emphysema suggested by radiologist interpretation of images.  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 We discussed expectations again. He is satisfied allergy vaccine is helping him. He is shifting to a [place] office but we discussed his plans to continue getting his vaccine injections here. Veramyst helps.. Using Zyrtec in the morning Zyrtec-D in the evening with patanase twice daily OSA doing well. Good compliance and control with BiPAP 11/5.  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 BiPAP 11/5- good compliance and control. Dymista nasal spray sample was no better than Patanase or Veramyst. He still complains of occasional dripping rhinorrhea.  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  was unable to tell a difference with the ipratropium nasal spray.  have we received records from [facility] [place]? I reviewed outside records including the disc of images including CT chest. 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 Continues allergy vaccine 1:2, GH. We discussed current standard of care that he should get his injections in a medical office. He continues BiPAP 11.5/5.5- wife says he occasionally snores through it but he sleeps well. 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  ROS-see HPI Constitutional:   No-   weight loss, night sweats, fevers, chills, fatigue, lassitude. HEENT:   No-  headaches, difficulty swallowing, tooth/dental problems, sore throat,      + sneezing, itching, ear ache, nasal congestion,+ post nasal drip,  CV:  No-   chest pain, orthopnea, PND, swelling in lower extremities, anasarca,  dizziness, palpitations Resp: No-   shortness of breath with exertion or at rest.              No-   productive cough,  No non-productive cough,  No- coughing up of blood.              No-   change in color of mucus.  No- wheezing.   Skin: No-   rash or lesions. GI:  No-   heartburn, indigestion, abdominal pain, nausea, vomiting, GU: . MS:  No-   joint pain or swelling.   Neuro-     nothing unusual Psych:  No- change in mood or affect. No depression or anxiety.  No memory loss.  OBJ General- Alert, Oriented, Affect-appropriate, Distress- none acute; tall slender man Skin- rash-none, lesions- none, excoriation- none Lymphadenopathy- none Head- atraumatic            Eyes- Gross vision intact, PERRLA, conjunctivae clear secretions            Ears- Hearing, canals-normal            Nose- narrow with mucus bridging, +Septal dev and external nose (hx fx), mucus, No-polyps, erosion, perforation.             Throat- Mallampati II , mucosa clear , drainage- none, tonsils- atrophic Neck- flexible , trachea midline, no stridor , thyroid nl, carotid no bruit Chest - symmetrical excursion , unlabored  Heart/CV- RRR , no murmur , no gallop  , no rub, nl s1 s2                           - JVD- none , edema- none, stasis changes- none, varices- none           Lung- clear to P&A, wheeze- none, cough- none , dullness-none, rub- none           Chest wall-  Abd-  Br/ Gen/ Rectal- Not done, not indicated Extrem- cyanosis- none, clubbing, none, atrophy- none, strength- nl Neuro- grossly intact to observation

## 2013-07-04 NOTE — Patient Instructions (Addendum)
Order- [facility] Need to establish for BIPAP Insp 12, Exp 6   Dx OSA, mask of choice, humidifier, supplies,  We can continue allergy vaccine 1:2 GH     Please call as needed

## 2013-07-20 NOTE — Assessment & Plan Note (Signed)
Good compliance and adequate control, but we think we can do better. Plan-establish with a local DME company and try increasing pressure to 12/6

## 2013-07-20 NOTE — Assessment & Plan Note (Signed)
Plan-continue allergy vaccine here

## 2013-07-23 ENCOUNTER — Telehealth: Payer: Self-pay | Admitting: Internal Medicine

## 2013-07-23 NOTE — Telephone Encounter (Signed)
Dup message °

## 2013-07-23 NOTE — Telephone Encounter (Signed)
Noted  

## 2013-07-23 NOTE — Telephone Encounter (Signed)
Will forward to CY as FYI; I also attempted to contact pt and was unable to reach him.

## 2013-07-24 ENCOUNTER — Ambulatory Visit (INDEPENDENT_AMBULATORY_CARE_PROVIDER_SITE_OTHER): Payer: 59

## 2013-07-24 DIAGNOSIS — J309 Allergic rhinitis, unspecified: Secondary | ICD-10-CM

## 2013-07-28 ENCOUNTER — Ambulatory Visit (INDEPENDENT_AMBULATORY_CARE_PROVIDER_SITE_OTHER): Payer: 59

## 2013-07-28 DIAGNOSIS — J309 Allergic rhinitis, unspecified: Secondary | ICD-10-CM

## 2013-07-31 ENCOUNTER — Ambulatory Visit: Payer: 59

## 2013-08-05 ENCOUNTER — Ambulatory Visit (INDEPENDENT_AMBULATORY_CARE_PROVIDER_SITE_OTHER): Payer: 59

## 2013-08-05 DIAGNOSIS — J309 Allergic rhinitis, unspecified: Secondary | ICD-10-CM

## 2013-08-12 ENCOUNTER — Ambulatory Visit (INDEPENDENT_AMBULATORY_CARE_PROVIDER_SITE_OTHER): Payer: 59

## 2013-08-12 DIAGNOSIS — I2789 Other specified pulmonary heart diseases: Secondary | ICD-10-CM

## 2013-08-19 ENCOUNTER — Ambulatory Visit (INDEPENDENT_AMBULATORY_CARE_PROVIDER_SITE_OTHER): Payer: 59

## 2013-08-19 DIAGNOSIS — J309 Allergic rhinitis, unspecified: Secondary | ICD-10-CM

## 2013-08-20 ENCOUNTER — Ambulatory Visit: Payer: 59

## 2013-09-01 ENCOUNTER — Ambulatory Visit (INDEPENDENT_AMBULATORY_CARE_PROVIDER_SITE_OTHER): Payer: 59

## 2013-09-01 DIAGNOSIS — J309 Allergic rhinitis, unspecified: Secondary | ICD-10-CM

## 2013-09-08 ENCOUNTER — Ambulatory Visit: Payer: 59

## 2013-09-09 ENCOUNTER — Ambulatory Visit: Payer: 59 | Admitting: Internal Medicine

## 2013-09-09 ENCOUNTER — Ambulatory Visit (INDEPENDENT_AMBULATORY_CARE_PROVIDER_SITE_OTHER): Payer: 59

## 2013-09-09 DIAGNOSIS — J309 Allergic rhinitis, unspecified: Secondary | ICD-10-CM

## 2013-09-15 ENCOUNTER — Ambulatory Visit (INDEPENDENT_AMBULATORY_CARE_PROVIDER_SITE_OTHER): Payer: 59

## 2013-09-15 DIAGNOSIS — J309 Allergic rhinitis, unspecified: Secondary | ICD-10-CM

## 2013-09-16 ENCOUNTER — Ambulatory Visit: Payer: 59

## 2013-09-17 ENCOUNTER — Ambulatory Visit (INDEPENDENT_AMBULATORY_CARE_PROVIDER_SITE_OTHER): Payer: 59

## 2013-09-17 DIAGNOSIS — J309 Allergic rhinitis, unspecified: Secondary | ICD-10-CM

## 2013-09-22 ENCOUNTER — Ambulatory Visit: Payer: 59

## 2013-09-24 ENCOUNTER — Ambulatory Visit (INDEPENDENT_AMBULATORY_CARE_PROVIDER_SITE_OTHER): Payer: 59

## 2013-09-24 DIAGNOSIS — J309 Allergic rhinitis, unspecified: Secondary | ICD-10-CM

## 2013-09-28 ENCOUNTER — Other Ambulatory Visit: Payer: Self-pay | Admitting: Internal Medicine

## 2013-09-29 NOTE — Telephone Encounter (Signed)
Ok to refill 

## 2013-09-29 NOTE — Telephone Encounter (Signed)
Please advise as this Rx is not on patients medication list. Thanks.

## 2013-10-02 ENCOUNTER — Ambulatory Visit (INDEPENDENT_AMBULATORY_CARE_PROVIDER_SITE_OTHER): Payer: 59

## 2013-10-02 DIAGNOSIS — J309 Allergic rhinitis, unspecified: Secondary | ICD-10-CM

## 2013-10-09 ENCOUNTER — Ambulatory Visit (INDEPENDENT_AMBULATORY_CARE_PROVIDER_SITE_OTHER): Payer: 59

## 2013-10-09 DIAGNOSIS — J309 Allergic rhinitis, unspecified: Secondary | ICD-10-CM

## 2013-10-13 ENCOUNTER — Ambulatory Visit (INDEPENDENT_AMBULATORY_CARE_PROVIDER_SITE_OTHER): Payer: 59

## 2013-10-13 DIAGNOSIS — J309 Allergic rhinitis, unspecified: Secondary | ICD-10-CM

## 2013-10-20 ENCOUNTER — Ambulatory Visit: Payer: 59

## 2013-10-24 ENCOUNTER — Ambulatory Visit (INDEPENDENT_AMBULATORY_CARE_PROVIDER_SITE_OTHER): Payer: 59

## 2013-10-24 DIAGNOSIS — J309 Allergic rhinitis, unspecified: Secondary | ICD-10-CM

## 2013-10-28 ENCOUNTER — Ambulatory Visit (INDEPENDENT_AMBULATORY_CARE_PROVIDER_SITE_OTHER): Payer: 59

## 2013-10-28 DIAGNOSIS — J309 Allergic rhinitis, unspecified: Secondary | ICD-10-CM

## 2013-10-30 ENCOUNTER — Other Ambulatory Visit: Payer: Self-pay

## 2013-11-04 ENCOUNTER — Ambulatory Visit (INDEPENDENT_AMBULATORY_CARE_PROVIDER_SITE_OTHER): Payer: 59

## 2013-11-04 DIAGNOSIS — J309 Allergic rhinitis, unspecified: Secondary | ICD-10-CM

## 2013-11-11 ENCOUNTER — Ambulatory Visit (INDEPENDENT_AMBULATORY_CARE_PROVIDER_SITE_OTHER): Payer: 59

## 2013-11-11 DIAGNOSIS — J309 Allergic rhinitis, unspecified: Secondary | ICD-10-CM

## 2013-11-24 ENCOUNTER — Ambulatory Visit (INDEPENDENT_AMBULATORY_CARE_PROVIDER_SITE_OTHER): Payer: 59

## 2013-11-24 DIAGNOSIS — J309 Allergic rhinitis, unspecified: Secondary | ICD-10-CM

## 2013-11-26 ENCOUNTER — Encounter: Payer: Self-pay | Admitting: Internal Medicine

## 2013-12-02 ENCOUNTER — Ambulatory Visit (INDEPENDENT_AMBULATORY_CARE_PROVIDER_SITE_OTHER): Payer: 59

## 2013-12-02 DIAGNOSIS — J309 Allergic rhinitis, unspecified: Secondary | ICD-10-CM

## 2013-12-09 ENCOUNTER — Ambulatory Visit (INDEPENDENT_AMBULATORY_CARE_PROVIDER_SITE_OTHER): Payer: 59

## 2013-12-09 DIAGNOSIS — J309 Allergic rhinitis, unspecified: Secondary | ICD-10-CM

## 2013-12-15 ENCOUNTER — Ambulatory Visit (INDEPENDENT_AMBULATORY_CARE_PROVIDER_SITE_OTHER): Payer: 59

## 2013-12-15 DIAGNOSIS — J309 Allergic rhinitis, unspecified: Secondary | ICD-10-CM

## 2013-12-23 ENCOUNTER — Ambulatory Visit (INDEPENDENT_AMBULATORY_CARE_PROVIDER_SITE_OTHER): Payer: 59

## 2013-12-23 DIAGNOSIS — J309 Allergic rhinitis, unspecified: Secondary | ICD-10-CM

## 2013-12-30 ENCOUNTER — Ambulatory Visit (INDEPENDENT_AMBULATORY_CARE_PROVIDER_SITE_OTHER): Payer: 59

## 2013-12-30 ENCOUNTER — Telehealth: Payer: Self-pay | Admitting: Internal Medicine

## 2013-12-30 DIAGNOSIS — J309 Allergic rhinitis, unspecified: Secondary | ICD-10-CM

## 2013-12-30 NOTE — Telephone Encounter (Signed)
Selena form [facility] called this morning saying Mr. Patrick Moyer is coming in to pick-up his vac.. I couldn't find this in his chart. I think you've discussed this before or it was mentioned in passing. Can he take his vac. With him when he comes in for his shot? Please advise.  Thanks, [employer]my Scott

## 2013-12-30 NOTE — Telephone Encounter (Signed)
Sadly he is also tranferring his care. I ask him if he needed his records and he said he already had them. Allergy & Ast[facility] practice is open til 7:00 p.m. On Tues.&Thurs. Which helps him out because he doesn't get off til 5:00. Which means he has to rush to get here by 5:30. His wife is coming by sometime this week to pick-up his vaccine. He was in hurry and couldn't wait for me to get his vac. Ready for him to take with him. I explained it wouldn't take long.

## 2013-12-30 NOTE — Telephone Encounter (Signed)
Ok, but ask if he is just getting his shots administered there, or transferring his care.

## 2014-01-02 NOTE — Telephone Encounter (Signed)
Per Cy-yes it is okay for Mr Patrick Moyer to take his vaccine with him.

## 2014-01-05 ENCOUNTER — Ambulatory Visit: Payer: Self-pay | Admitting: Internal Medicine

## 2014-01-27 ENCOUNTER — Other Ambulatory Visit: Payer: Self-pay | Admitting: Internal Medicine

## 2014-02-15 ENCOUNTER — Other Ambulatory Visit: Payer: Self-pay | Admitting: Internal Medicine

## 2014-04-07 ENCOUNTER — Other Ambulatory Visit: Payer: Self-pay | Admitting: Internal Medicine

## 2014-05-11 ENCOUNTER — Other Ambulatory Visit: Payer: Self-pay | Admitting: Internal Medicine

## 2014-05-12 NOTE — Telephone Encounter (Signed)
Last OV: 07-04-13 No Pending appts   Cancelled 12-2013 OV  Please advise on refill. Thanks.

## 2014-05-12 NOTE — Telephone Encounter (Signed)
Ok to refill 

## 2014-05-15 MED ORDER — MONTELUKAST SODIUM 10 MG PO TABS: ORAL_TABLET | ORAL | Status: DC

## 2014-05-15 NOTE — Addendum Note (Signed)
Addended by: Ronny Bacon on: 05/15/2014 04:31 PM   Modules accepted: Orders

## 2014-07-24 ENCOUNTER — Encounter: Payer: Self-pay | Admitting: Internal Medicine

## 2014-11-06 ENCOUNTER — Encounter: Payer: Self-pay | Admitting: Internal Medicine

## 2014-11-10 ENCOUNTER — Ambulatory Visit
Admission: RE | Admit: 2014-11-10 | Discharge: 2014-11-10 | Disposition: A | Discharge status: Home / Self Care | Payer: BC Managed Care – PPO | Source: Ambulatory Visit | Attending: Family Medicine | Admitting: Family Medicine

## 2014-11-10 ENCOUNTER — Other Ambulatory Visit: Payer: Self-pay | Admitting: Family Medicine

## 2014-11-10 DIAGNOSIS — R52 Pain, unspecified: Secondary | ICD-10-CM

## 2015-04-22 ENCOUNTER — Other Ambulatory Visit: Payer: Self-pay | Admitting: Family Medicine

## 2015-04-22 DIAGNOSIS — R918 Other nonspecific abnormal finding of lung field: Secondary | ICD-10-CM

## 2015-05-06 ENCOUNTER — Ambulatory Visit
Admission: RE | Admit: 2015-05-06 | Discharge: 2015-05-06 | Disposition: A | Discharge status: Home / Self Care | Payer: BLUE CROSS/BLUE SHIELD | Source: Ambulatory Visit | Attending: Family Medicine | Admitting: Family Medicine

## 2015-05-06 DIAGNOSIS — R918 Other nonspecific abnormal finding of lung field: Secondary | ICD-10-CM

## 2015-05-21 ENCOUNTER — Inpatient Hospital Stay
Admission: RE | Admit: 2015-05-21 | Discharge: 2015-05-21 | Disposition: A | Discharge status: Home / Self Care | Payer: Self-pay | Source: Ambulatory Visit | Attending: Family Medicine | Admitting: Family Medicine

## 2015-05-21 ENCOUNTER — Other Ambulatory Visit: Payer: Self-pay | Admitting: Family Medicine

## 2015-05-21 DIAGNOSIS — R918 Other nonspecific abnormal finding of lung field: Secondary | ICD-10-CM

## 2016-01-26 ENCOUNTER — Other Ambulatory Visit: Payer: Self-pay | Admitting: Neurology

## 2016-04-25 ENCOUNTER — Ambulatory Visit (INDEPENDENT_AMBULATORY_CARE_PROVIDER_SITE_OTHER): Payer: BLUE CROSS/BLUE SHIELD | Admitting: Allergy and Immunology

## 2016-04-25 ENCOUNTER — Encounter: Payer: Self-pay | Admitting: Allergy and Immunology

## 2016-04-25 VITALS — BP 132/82 | HR 68 | Resp 16 | Ht 75.98 in | Wt 191.6 lb

## 2016-04-25 DIAGNOSIS — J3089 Other allergic rhinitis: Secondary | ICD-10-CM

## 2016-04-25 DIAGNOSIS — K219 Gastro-esophageal reflux disease without esophagitis: Secondary | ICD-10-CM

## 2016-04-25 DIAGNOSIS — G4730 Sleep apnea, unspecified: Secondary | ICD-10-CM | POA: Diagnosis not present

## 2016-04-25 NOTE — Patient Instructions (Signed)
  1. Obtain sleep study with split night protocol  2. Continue Flonase one-2 sprays each nostril one-7 times per week  3. Continue over-the-counter omeprazole 20 mg daily or can use Zantac/Tums  4. Return to clinic in 1 year or earlier if problem

## 2016-04-25 NOTE — Progress Notes (Signed)
Follow-up Note  Referring Provider: Henrine Screwshacker, Robert, MD Primary Provider: [name],[name], MD Date of Office Visit: 04/25/2016  Subjective:   Patrick Moyer (DOB: December 29, 1950) is a 65 y.o. male who [facility] in re-evaluation of the following:  HPI: [name] in reevaluation of his rhinitis with allergic and nonallergic triggers, reflux, and sleep apnea. I have not seen him in this clinic since January 2016  His rhinitis has improved tremendously as he's moved forward. Presently he is using Flonase very intermittently and no longer uses any montelukast and has no need to use any Astelin. He does remain on antihistamine on most days. He's had no significant episodes of sinusitis.  His reflux appears to be improving as well. He is now down to using Prilosec 20 mg daily. Chocolate consumption is a major trigger to get his reflux active. He is questioning whether or not there is an association between the development of dementia the use of a proton pump inhibitor based upon studies that he has read showing such a length. He is questioning whether or not he needs a proton pump inhibitor at this point in time.   He's having some difficulty with his CPAP machine and mask. He has a nasal pillow and apparently he is having some leakage through his oral cavity that has developed a pattern that is relatively new and bothersome to his sleep partner. It does appear as though his sleep apnea device is working relatively well and preventing the development of significant cognitive defects or sleepiness during the daytime. I've years since she's had a sleep study or an update on his machine.    Medication List           ALIGN 4 MG Caps  Take 1 capsule by mouth daily.     B COMPLEX 100 PO  Take by mouth daily.     cetirizine 10 MG tablet  Commonly known as:  ZYRTEC  Take 1 tablet (10 mg total) by mouth every evening.     cholecalciferol 1000 units tablet  Commonly known as:  VITAMIN D  Take 1,000 Units by mouth 2 (two) times daily.     fluticasone 50 MCG/ACT nasal spray  Commonly known as:  FLONASE  Place 1 spray into both nostrils daily.     glucosamine-chondroitin 500-400 MG tablet  Take 1 tablet by mouth 2 (two) times daily.     guaiFENesin 600 MG 12 hr tablet  Commonly known as:  MUCINEX  Take 600 mg by mouth 2 (two) times daily. Reported on 04/25/2016     IBUPROFEN PO  Take by mouth.     KRILL OIL PO  Take by mouth daily.     L-Lysine 500 MG Caps  Take 1 capsule by mouth 2 (two) times daily.     NUTRITIONAL SUPPLEMENT PO  Take by mouth. Fulvic Acid     omeprazole 20 MG tablet  Commonly known as:  PRILOSEC OTC  Take 20 mg by mouth daily as needed.     SAM-e 400 MG Tabs  Take 1 tablet by mouth at bedtime.     saw palmetto 500 MG capsule  Take 500 mg by mouth 2 (two) times daily.     ZYRTEC-D ALLERGY & CONGESTION 5-120 MG tablet  Generic drug:  cetirizine-pseudoephedrine  TAKE 1 TABLET BY MOUTH EVERY MORNING.        Past Medical History  Diagnosis Date  .  Multiple allergies   . Sleep apnea     BIPAP  . Low blood pressure   . Aortic root enlargement (HCC)     Upper Limits  . Positive TB test     +PPD age 65. INH x 1 year  . 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 ALLERGIC RHINITIS   . Depression   . Diverticulosis   . GERD (gastroesophageal reflux disease)   . History of pneumonia   . Pigment dispersion syndrome   . Marfan's syndrome     Past Surgical History  Procedure Laterality Date  . Inguinal hernia repair  1978  . Nasal septum surgery  1975    nasal  age 89  . Appendectomy  2010  . Wisdom tooth extraction  1975  . Digital nerve repair  1995    Allergies  Allergen Reactions  . Fluconazole In Dextrose Rash    POWDER  . Other     Adhesive, Caffene-abd pain  . Golytely [Peg 3350-Electrolytes]     Orange flavored  . Metronidazole     . Soy Protein [Soybean Oil]     Gastrointestinal upset    Review of systems negative except as noted in HPI / PMHx or noted below:  Review of Systems  Constitutional: Negative.   HENT: Negative.   Eyes: Negative.   Respiratory: Negative.   Cardiovascular: Negative.        Swelling and pain of his lower extremities believed to be secondary to venous insufficiency for which she just started wearing compression stockings  Gastrointestinal: Negative.   Genitourinary: Negative.   Musculoskeletal: Negative.   Skin: Negative.   Neurological: Negative.   Endo/Heme/Allergies: Negative.   Psychiatric/Behavioral: Negative.      Objective:   Filed Vitals:   04/25/16 1154  BP: 132/82  Pulse: 68  Resp: 16   Height: 6' 3.98" (193 cm)  Weight: 191 lb 9.3 oz (86.9 kg)   Physical Exam  Constitutional: He is well-developed, well-nourished, and in no distress.  HENT:  Head: Normocephalic.  Right Ear: Tympanic membrane, external ear and ear canal normal.  Left Ear: Tympanic membrane, external ear and ear canal normal.  Nose: Nose normal. No mucosal edema or rhinorrhea.  Mouth/Throat: Uvula is midline, oropharynx is clear and moist and mucous membranes are normal. No oropharyngeal exudate.  Eyes: Conjunctivae are normal.  Neck: Trachea normal. No tracheal tenderness present. No tracheal deviation present. No thyromegaly present.  Cardiovascular: Normal rate, regular rhythm, S1 normal, S2 normal and normal heart sounds.   No murmur heard. Pulmonary/Chest: Breath sounds normal. No stridor. No respiratory distress. He has no wheezes. He has no rales.  Musculoskeletal: He exhibits no edema.  Lymphadenopathy:       Head (right side): No tonsillar adenopathy present.       Head (left side): No tonsillar adenopathy present.    He has no cervical adenopathy.  Neurological: He is alert. Gait normal.  Skin: No rash noted. He is not diaphoretic. No erythema. Nails show no clubbing.   Psychiatric: Mood and affect normal.    Diagnostics: None   Assessment and Plan:   1. Sleep apnea   2. Other allergic rhinitis   3. Gastroesophageal reflux disease, esophagitis presence not specified     1. Obtain sleep study with split night protocol  2. Continue Flonase one-2 sprays each nostril one-7 times per week  3. Continue over-the-counter omeprazole 20 mg daily or can  use Zantac/Tums  4. Return to clinic in 1 year or earlier if problem  At when appears to be doing relatively well and he'll continue to use Flonase as needed and he certainly has the option of stopping his proton pump inhibitor and trying a H2 receptor blocker or an antacid. We will repeat his sleep study with a split-night protocol to see what settings are optimal for his BiPAP machine. I'll see him back in this clinic in 1 year but obviously I'll be contacting him with the results of his sleep study once it is available for review.   Laurette Schimke, MD [facility]

## 2016-09-18 ENCOUNTER — Other Ambulatory Visit: Payer: Self-pay | Admitting: Ophthalmology

## 2016-09-18 DIAGNOSIS — G508 Other disorders of trigeminal nerve: Secondary | ICD-10-CM

## 2016-09-19 ENCOUNTER — Other Ambulatory Visit: Payer: Self-pay | Admitting: Ophthalmology

## 2016-09-19 DIAGNOSIS — G508 Other disorders of trigeminal nerve: Secondary | ICD-10-CM

## 2016-10-16 ENCOUNTER — Other Ambulatory Visit: Payer: BLUE CROSS/BLUE SHIELD

## 2016-10-16 ENCOUNTER — Ambulatory Visit
Admission: RE | Admit: 2016-10-16 | Discharge: 2016-10-16 | Disposition: A | Discharge status: Home / Self Care | Payer: BLUE CROSS/BLUE SHIELD | Source: Ambulatory Visit | Attending: Ophthalmology | Admitting: Ophthalmology

## 2016-10-16 DIAGNOSIS — G508 Other disorders of trigeminal nerve: Secondary | ICD-10-CM

## 2016-10-16 MED ORDER — GADOBENATE DIMEGLUMINE 529 MG/ML IV SOLN: 18.0000 mL | Freq: Once | INTRAVENOUS | Status: DC | PRN

## 2017-03-21 ENCOUNTER — Encounter: Payer: Self-pay | Admitting: Gastroenterology

## 2017-09-26 DIAGNOSIS — H21239 Degeneration of iris (pigmentary), unspecified eye: Secondary | ICD-10-CM | POA: Diagnosis not present

## 2017-09-26 DIAGNOSIS — H538 Other visual disturbances: Secondary | ICD-10-CM | POA: Diagnosis not present

## 2017-09-26 DIAGNOSIS — H2513 Age-related nuclear cataract, bilateral: Secondary | ICD-10-CM | POA: Diagnosis not present

## 2017-09-26 DIAGNOSIS — Q8743 Marfan's syndrome with skeletal manifestation: Secondary | ICD-10-CM | POA: Diagnosis not present

## 2017-09-28 DIAGNOSIS — N401 Enlarged prostate with lower urinary tract symptoms: Secondary | ICD-10-CM | POA: Diagnosis not present

## 2017-09-28 DIAGNOSIS — R3912 Poor urinary stream: Secondary | ICD-10-CM | POA: Diagnosis not present

## 2017-09-28 DIAGNOSIS — N3943 Post-void dribbling: Secondary | ICD-10-CM | POA: Diagnosis not present

## 2017-09-28 DIAGNOSIS — N529 Male erectile dysfunction, unspecified: Secondary | ICD-10-CM | POA: Diagnosis not present

## 2017-10-12 DIAGNOSIS — M76822 Posterior tibial tendinitis, left leg: Secondary | ICD-10-CM | POA: Diagnosis not present

## 2017-10-12 DIAGNOSIS — M25572 Pain in left ankle and joints of left foot: Secondary | ICD-10-CM | POA: Diagnosis not present

## 2017-10-31 DIAGNOSIS — K900 Celiac disease: Secondary | ICD-10-CM | POA: Diagnosis not present

## 2017-10-31 DIAGNOSIS — G4733 Obstructive sleep apnea (adult) (pediatric): Secondary | ICD-10-CM | POA: Diagnosis not present

## 2017-11-02 DIAGNOSIS — G4733 Obstructive sleep apnea (adult) (pediatric): Secondary | ICD-10-CM | POA: Diagnosis not present

## 2017-11-12 DIAGNOSIS — G4733 Obstructive sleep apnea (adult) (pediatric): Secondary | ICD-10-CM | POA: Diagnosis not present

## 2017-11-12 DIAGNOSIS — I77810 Thoracic aortic ectasia: Secondary | ICD-10-CM | POA: Diagnosis not present

## 2017-11-12 DIAGNOSIS — N529 Male erectile dysfunction, unspecified: Secondary | ICD-10-CM | POA: Diagnosis not present

## 2017-11-12 DIAGNOSIS — Z23 Encounter for immunization: Secondary | ICD-10-CM | POA: Diagnosis not present

## 2017-11-12 DIAGNOSIS — J3089 Other allergic rhinitis: Secondary | ICD-10-CM | POA: Diagnosis not present

## 2017-11-12 DIAGNOSIS — K9041 Non-celiac gluten sensitivity: Secondary | ICD-10-CM | POA: Diagnosis not present

## 2017-11-12 DIAGNOSIS — N401 Enlarged prostate with lower urinary tract symptoms: Secondary | ICD-10-CM | POA: Diagnosis not present

## 2017-11-12 DIAGNOSIS — R2991 Unspecified symptoms and signs involving the musculoskeletal system: Secondary | ICD-10-CM | POA: Diagnosis not present

## 2017-11-21 DIAGNOSIS — M79672 Pain in left foot: Secondary | ICD-10-CM | POA: Diagnosis not present

## 2017-11-21 DIAGNOSIS — B351 Tinea unguium: Secondary | ICD-10-CM | POA: Diagnosis not present

## 2017-11-21 DIAGNOSIS — M25572 Pain in left ankle and joints of left foot: Secondary | ICD-10-CM | POA: Diagnosis not present

## 2017-11-21 DIAGNOSIS — M76822 Posterior tibial tendinitis, left leg: Secondary | ICD-10-CM | POA: Diagnosis not present

## 2017-12-07 DIAGNOSIS — Z8679 Personal history of other diseases of the circulatory system: Secondary | ICD-10-CM | POA: Diagnosis not present

## 2017-12-07 DIAGNOSIS — M79605 Pain in left leg: Secondary | ICD-10-CM | POA: Diagnosis not present

## 2017-12-13 DIAGNOSIS — M79605 Pain in left leg: Secondary | ICD-10-CM | POA: Diagnosis not present

## 2017-12-30 DIAGNOSIS — M79605 Pain in left leg: Secondary | ICD-10-CM | POA: Diagnosis not present

## 2018-01-29 DIAGNOSIS — B372 Candidiasis of skin and nail: Secondary | ICD-10-CM | POA: Diagnosis not present

## 2018-01-31 DIAGNOSIS — N5201 Erectile dysfunction due to arterial insufficiency: Secondary | ICD-10-CM | POA: Diagnosis not present

## 2018-01-31 DIAGNOSIS — N401 Enlarged prostate with lower urinary tract symptoms: Secondary | ICD-10-CM | POA: Diagnosis not present

## 2018-01-31 DIAGNOSIS — N3943 Post-void dribbling: Secondary | ICD-10-CM | POA: Diagnosis not present

## 2018-01-31 DIAGNOSIS — R3912 Poor urinary stream: Secondary | ICD-10-CM | POA: Diagnosis not present

## 2018-03-07 DIAGNOSIS — M2142 Flat foot [pes planus] (acquired), left foot: Secondary | ICD-10-CM | POA: Diagnosis not present

## 2018-03-07 DIAGNOSIS — M79605 Pain in left leg: Secondary | ICD-10-CM | POA: Diagnosis not present

## 2018-03-07 DIAGNOSIS — M2141 Flat foot [pes planus] (acquired), right foot: Secondary | ICD-10-CM | POA: Diagnosis not present

## 2018-03-07 DIAGNOSIS — M216X2 Other acquired deformities of left foot: Secondary | ICD-10-CM | POA: Diagnosis not present

## 2018-03-07 DIAGNOSIS — M76829 Posterior tibial tendinitis, unspecified leg: Secondary | ICD-10-CM | POA: Diagnosis not present

## 2018-03-13 DIAGNOSIS — G4733 Obstructive sleep apnea (adult) (pediatric): Secondary | ICD-10-CM | POA: Diagnosis not present

## 2018-03-18 DIAGNOSIS — K5900 Constipation, unspecified: Secondary | ICD-10-CM | POA: Diagnosis not present

## 2018-03-18 DIAGNOSIS — R1310 Dysphagia, unspecified: Secondary | ICD-10-CM | POA: Diagnosis not present

## 2018-03-18 DIAGNOSIS — Z1211 Encounter for screening for malignant neoplasm of colon: Secondary | ICD-10-CM | POA: Diagnosis not present

## 2018-03-18 DIAGNOSIS — K219 Gastro-esophageal reflux disease without esophagitis: Secondary | ICD-10-CM | POA: Diagnosis not present

## 2018-03-21 DIAGNOSIS — K210 Gastro-esophageal reflux disease with esophagitis: Secondary | ICD-10-CM | POA: Diagnosis not present

## 2018-03-21 DIAGNOSIS — Z1211 Encounter for screening for malignant neoplasm of colon: Secondary | ICD-10-CM | POA: Diagnosis not present

## 2018-03-21 DIAGNOSIS — K208 Other esophagitis: Secondary | ICD-10-CM | POA: Diagnosis not present

## 2018-03-21 DIAGNOSIS — R1310 Dysphagia, unspecified: Secondary | ICD-10-CM | POA: Diagnosis not present

## 2018-03-21 DIAGNOSIS — K5730 Diverticulosis of large intestine without perforation or abscess without bleeding: Secondary | ICD-10-CM | POA: Diagnosis not present

## 2018-04-11 DIAGNOSIS — M25572 Pain in left ankle and joints of left foot: Secondary | ICD-10-CM | POA: Diagnosis not present

## 2018-04-11 DIAGNOSIS — M19072 Primary osteoarthritis, left ankle and foot: Secondary | ICD-10-CM | POA: Diagnosis not present

## 2018-04-11 DIAGNOSIS — B351 Tinea unguium: Secondary | ICD-10-CM | POA: Diagnosis not present

## 2018-04-11 DIAGNOSIS — M76822 Posterior tibial tendinitis, left leg: Secondary | ICD-10-CM | POA: Diagnosis not present

## 2018-04-23 ENCOUNTER — Other Ambulatory Visit: Payer: Self-pay | Admitting: Family Medicine

## 2018-04-23 DIAGNOSIS — M76829 Posterior tibial tendinitis, unspecified leg: Secondary | ICD-10-CM | POA: Diagnosis not present

## 2018-04-23 DIAGNOSIS — M79605 Pain in left leg: Secondary | ICD-10-CM | POA: Diagnosis not present

## 2018-04-23 DIAGNOSIS — I77810 Thoracic aortic ectasia: Secondary | ICD-10-CM

## 2018-04-23 DIAGNOSIS — M67972 Unspecified disorder of synovium and tendon, left ankle and foot: Secondary | ICD-10-CM | POA: Diagnosis not present

## 2018-04-25 ENCOUNTER — Other Ambulatory Visit: Payer: BLUE CROSS/BLUE SHIELD

## 2018-04-26 ENCOUNTER — Other Ambulatory Visit: Payer: BLUE CROSS/BLUE SHIELD

## 2018-05-07 ENCOUNTER — Ambulatory Visit
Admission: RE | Admit: 2018-05-07 | Discharge: 2018-05-07 | Disposition: A | Discharge status: Home / Self Care | Payer: BLUE CROSS/BLUE SHIELD | Source: Ambulatory Visit | Attending: Family Medicine | Admitting: Family Medicine

## 2018-05-07 DIAGNOSIS — I712 Thoracic aortic aneurysm, without rupture: Secondary | ICD-10-CM | POA: Diagnosis not present

## 2018-05-07 DIAGNOSIS — I77810 Thoracic aortic ectasia: Secondary | ICD-10-CM

## 2018-05-07 MED ORDER — IOPAMIDOL (ISOVUE-370) INJECTION 76%
75.0000 mL | Freq: Once | INTRAVENOUS | Status: AC | PRN
  Administered 2018-05-07: 75 mL via INTRAVENOUS

## 2018-05-13 DIAGNOSIS — N401 Enlarged prostate with lower urinary tract symptoms: Secondary | ICD-10-CM | POA: Diagnosis not present

## 2018-05-13 DIAGNOSIS — R3911 Hesitancy of micturition: Secondary | ICD-10-CM | POA: Diagnosis not present

## 2018-05-13 DIAGNOSIS — R3912 Poor urinary stream: Secondary | ICD-10-CM | POA: Diagnosis not present

## 2018-05-13 DIAGNOSIS — N3943 Post-void dribbling: Secondary | ICD-10-CM | POA: Diagnosis not present

## 2018-05-16 DIAGNOSIS — M67912 Unspecified disorder of synovium and tendon, left shoulder: Secondary | ICD-10-CM | POA: Diagnosis not present

## 2018-05-31 DIAGNOSIS — M76829 Posterior tibial tendinitis, unspecified leg: Secondary | ICD-10-CM | POA: Diagnosis not present

## 2018-05-31 DIAGNOSIS — R2689 Other abnormalities of gait and mobility: Secondary | ICD-10-CM | POA: Diagnosis not present

## 2018-06-03 DIAGNOSIS — R2689 Other abnormalities of gait and mobility: Secondary | ICD-10-CM | POA: Diagnosis not present

## 2018-06-03 DIAGNOSIS — M76829 Posterior tibial tendinitis, unspecified leg: Secondary | ICD-10-CM | POA: Diagnosis not present

## 2018-06-06 DIAGNOSIS — M76829 Posterior tibial tendinitis, unspecified leg: Secondary | ICD-10-CM | POA: Diagnosis not present

## 2018-06-06 DIAGNOSIS — R2689 Other abnormalities of gait and mobility: Secondary | ICD-10-CM | POA: Diagnosis not present

## 2018-06-10 DIAGNOSIS — M76829 Posterior tibial tendinitis, unspecified leg: Secondary | ICD-10-CM | POA: Diagnosis not present

## 2018-06-10 DIAGNOSIS — R2689 Other abnormalities of gait and mobility: Secondary | ICD-10-CM | POA: Diagnosis not present

## 2018-06-12 DIAGNOSIS — M76829 Posterior tibial tendinitis, unspecified leg: Secondary | ICD-10-CM | POA: Diagnosis not present

## 2018-06-12 DIAGNOSIS — R2689 Other abnormalities of gait and mobility: Secondary | ICD-10-CM | POA: Diagnosis not present

## 2018-06-17 DIAGNOSIS — M76829 Posterior tibial tendinitis, unspecified leg: Secondary | ICD-10-CM | POA: Diagnosis not present

## 2018-06-17 DIAGNOSIS — R2689 Other abnormalities of gait and mobility: Secondary | ICD-10-CM | POA: Diagnosis not present

## 2018-06-19 DIAGNOSIS — M76829 Posterior tibial tendinitis, unspecified leg: Secondary | ICD-10-CM | POA: Diagnosis not present

## 2018-06-19 DIAGNOSIS — R2689 Other abnormalities of gait and mobility: Secondary | ICD-10-CM | POA: Diagnosis not present

## 2018-06-24 DIAGNOSIS — M76829 Posterior tibial tendinitis, unspecified leg: Secondary | ICD-10-CM | POA: Diagnosis not present

## 2018-06-24 DIAGNOSIS — R2689 Other abnormalities of gait and mobility: Secondary | ICD-10-CM | POA: Diagnosis not present

## 2018-06-26 DIAGNOSIS — R2689 Other abnormalities of gait and mobility: Secondary | ICD-10-CM | POA: Diagnosis not present

## 2018-06-26 DIAGNOSIS — M76829 Posterior tibial tendinitis, unspecified leg: Secondary | ICD-10-CM | POA: Diagnosis not present

## 2018-06-28 DIAGNOSIS — M79605 Pain in left leg: Secondary | ICD-10-CM | POA: Diagnosis not present

## 2018-06-28 DIAGNOSIS — M76829 Posterior tibial tendinitis, unspecified leg: Secondary | ICD-10-CM | POA: Diagnosis not present

## 2018-06-28 DIAGNOSIS — R202 Paresthesia of skin: Secondary | ICD-10-CM | POA: Diagnosis not present

## 2018-07-01 DIAGNOSIS — R2689 Other abnormalities of gait and mobility: Secondary | ICD-10-CM | POA: Diagnosis not present

## 2018-07-01 DIAGNOSIS — M76829 Posterior tibial tendinitis, unspecified leg: Secondary | ICD-10-CM | POA: Diagnosis not present

## 2018-07-02 DIAGNOSIS — S90819A Abrasion, unspecified foot, initial encounter: Secondary | ICD-10-CM | POA: Diagnosis not present

## 2018-07-03 DIAGNOSIS — M76829 Posterior tibial tendinitis, unspecified leg: Secondary | ICD-10-CM | POA: Diagnosis not present

## 2018-07-03 DIAGNOSIS — R2689 Other abnormalities of gait and mobility: Secondary | ICD-10-CM | POA: Diagnosis not present

## 2018-07-08 DIAGNOSIS — R2689 Other abnormalities of gait and mobility: Secondary | ICD-10-CM | POA: Diagnosis not present

## 2018-07-08 DIAGNOSIS — M76829 Posterior tibial tendinitis, unspecified leg: Secondary | ICD-10-CM | POA: Diagnosis not present

## 2018-07-10 DIAGNOSIS — M76829 Posterior tibial tendinitis, unspecified leg: Secondary | ICD-10-CM | POA: Diagnosis not present

## 2018-07-10 DIAGNOSIS — R2689 Other abnormalities of gait and mobility: Secondary | ICD-10-CM | POA: Diagnosis not present

## 2018-07-15 DIAGNOSIS — N529 Male erectile dysfunction, unspecified: Secondary | ICD-10-CM | POA: Diagnosis not present

## 2018-07-15 DIAGNOSIS — I77810 Thoracic aortic ectasia: Secondary | ICD-10-CM | POA: Diagnosis not present

## 2018-07-15 DIAGNOSIS — Z0000 Encounter for general adult medical examination without abnormal findings: Secondary | ICD-10-CM | POA: Diagnosis not present

## 2018-07-15 DIAGNOSIS — K210 Gastro-esophageal reflux disease with esophagitis: Secondary | ICD-10-CM | POA: Diagnosis not present

## 2018-07-15 DIAGNOSIS — G4733 Obstructive sleep apnea (adult) (pediatric): Secondary | ICD-10-CM | POA: Diagnosis not present

## 2018-07-15 DIAGNOSIS — N401 Enlarged prostate with lower urinary tract symptoms: Secondary | ICD-10-CM | POA: Diagnosis not present

## 2018-07-15 DIAGNOSIS — Z136 Encounter for screening for cardiovascular disorders: Secondary | ICD-10-CM | POA: Diagnosis not present

## 2018-07-15 DIAGNOSIS — J3089 Other allergic rhinitis: Secondary | ICD-10-CM | POA: Diagnosis not present

## 2018-07-17 DIAGNOSIS — M76829 Posterior tibial tendinitis, unspecified leg: Secondary | ICD-10-CM | POA: Diagnosis not present

## 2018-07-17 DIAGNOSIS — R2689 Other abnormalities of gait and mobility: Secondary | ICD-10-CM | POA: Diagnosis not present

## 2018-07-22 DIAGNOSIS — R2689 Other abnormalities of gait and mobility: Secondary | ICD-10-CM | POA: Diagnosis not present

## 2018-07-22 DIAGNOSIS — M76829 Posterior tibial tendinitis, unspecified leg: Secondary | ICD-10-CM | POA: Diagnosis not present

## 2018-07-24 DIAGNOSIS — M76829 Posterior tibial tendinitis, unspecified leg: Secondary | ICD-10-CM | POA: Diagnosis not present

## 2018-07-24 DIAGNOSIS — R2689 Other abnormalities of gait and mobility: Secondary | ICD-10-CM | POA: Diagnosis not present

## 2018-09-02 DIAGNOSIS — B349 Viral infection, unspecified: Secondary | ICD-10-CM | POA: Diagnosis not present

## 2018-09-16 DIAGNOSIS — Z23 Encounter for immunization: Secondary | ICD-10-CM | POA: Diagnosis not present

## 2018-11-16 DIAGNOSIS — H11433 Conjunctival hyperemia, bilateral: Secondary | ICD-10-CM | POA: Diagnosis not present

## 2018-11-16 DIAGNOSIS — H5789 Other specified disorders of eye and adnexa: Secondary | ICD-10-CM | POA: Diagnosis not present

## 2018-12-13 DIAGNOSIS — M19072 Primary osteoarthritis, left ankle and foot: Secondary | ICD-10-CM | POA: Diagnosis not present

## 2018-12-13 DIAGNOSIS — M76822 Posterior tibial tendinitis, left leg: Secondary | ICD-10-CM | POA: Diagnosis not present

## 2018-12-13 DIAGNOSIS — M25572 Pain in left ankle and joints of left foot: Secondary | ICD-10-CM | POA: Diagnosis not present

## 2018-12-13 DIAGNOSIS — R2689 Other abnormalities of gait and mobility: Secondary | ICD-10-CM | POA: Diagnosis not present

## 2018-12-17 DIAGNOSIS — G4733 Obstructive sleep apnea (adult) (pediatric): Secondary | ICD-10-CM | POA: Diagnosis not present

## 2019-01-09 ENCOUNTER — Other Ambulatory Visit: Payer: Self-pay | Admitting: Urology

## 2019-04-01 ENCOUNTER — Ambulatory Visit (HOSPITAL_BASED_OUTPATIENT_CLINIC_OR_DEPARTMENT_OTHER): Admission: RE | Admit: 2019-04-01 | Payer: Medicare PPO | Source: Home / Self Care | Admitting: Urology

## 2019-04-01 ENCOUNTER — Encounter (HOSPITAL_BASED_OUTPATIENT_CLINIC_OR_DEPARTMENT_OTHER): Admission: RE | Payer: Self-pay | Source: Home / Self Care

## 2019-04-01 SURGERY — TURP (TRANSURETHRAL RESECTION OF PROSTATE)
Anesthesia: General

## 2019-09-05 ENCOUNTER — Other Ambulatory Visit: Payer: Self-pay

## 2019-09-05 ENCOUNTER — Ambulatory Visit (INDEPENDENT_AMBULATORY_CARE_PROVIDER_SITE_OTHER): Payer: Medicare PPO | Admitting: Cardiology

## 2019-09-05 VITALS — BP 122/76 | HR 64 | Ht 75.6 in | Wt 207.4 lb

## 2019-09-05 DIAGNOSIS — R9389 Abnormal findings on diagnostic imaging of other specified body structures: Secondary | ICD-10-CM | POA: Diagnosis not present

## 2019-09-05 DIAGNOSIS — I712 Thoracic aortic aneurysm, without rupture: Secondary | ICD-10-CM | POA: Diagnosis not present

## 2019-09-05 DIAGNOSIS — I7121 Aneurysm of the ascending aorta, without rupture: Secondary | ICD-10-CM

## 2019-09-05 DIAGNOSIS — R0789 Other chest pain: Secondary | ICD-10-CM | POA: Diagnosis not present

## 2019-09-05 DIAGNOSIS — R2991 Unspecified symptoms and signs involving the musculoskeletal system: Secondary | ICD-10-CM

## 2019-09-05 DIAGNOSIS — R079 Chest pain, unspecified: Secondary | ICD-10-CM | POA: Diagnosis not present

## 2019-09-05 NOTE — Patient Instructions (Addendum)
Your physician recommends that you continue on your current medications as directed. Please refer to the Current Medication list given to you today.  Your physician has requested that you have an echocardiogram. Echocardiography is a painless test that uses sound waves to create images of your heart. It provides your doctor with information about the size and shape of your heart and how well your heart's chambers and valves are working. This procedure takes approximately one hour. There are no restrictions for this procedure.   Your physician wants you to follow-up in: [place] will receive a reminder letter in the mail two months in advance. If you don't receive a letter, please call our office to schedule the follow-up appointment.   Your cardiac CT will be scheduled at one of the below locations:   [facility] [address] [place], [place] [address] [phone]  If scheduled at [facility], please arrive at the [facility] main entrance of [facility] 30-45 minutes prior to test start time. Proceed to the [facility] Radiology Department (first floor) to check-in and test prep.  If scheduled at [facility], please arrive 15 mins early for check-in and test prep.  Please follow these instructions carefully (unless otherwise directed):  Hold all erectile dysfunction medications at least 5 days prior to test.  On the Night Before the Test: . Be sure to Drink plenty of water. . Do not consume any caffeinated/decaffeinated beverages or chocolate 12 hours prior to your test. . Do not take any antihistamines 12 hours prior to your test. On the Day of the Test: . Drink plenty of water. Do not drink any water within one hour of the test. . Do not eat any food 4 hours prior to the test. . You may take your regular medications prior to the test.  . Take metoprolol (Lopressor) two hours prior to test. . HOLD  Furosemide/Hydrochlorothiazide morning of the test. . FEMALES- please wear underwire-free bra if available   *For Clinical Staff only. Please instruct patient the following:*        -Drink plenty of water       -Hold Furosemide/hydrochlorothiazide morning of the test       -     After the Test: . Drink plenty of water. . After receiving IV contrast, you may experience a mild flushed feeling. This is normal. . On occasion, you may experience a mild rash up to 24 hours after the test. This is not dangerous. If this occurs, you can take Benadryl 25 mg and increase your fluid intake. . If you experience trouble breathing, this can be serious. If it is severe call 911 IMMEDIATELY. If it is mild, please call our office. . If you take any of these medications: Glipizide/Metformin, Avandament, Glucavance, please do not take 48 hours after completing test unless otherwise instructed.    Please contact the cardiac imaging nurse navigator should you have any questions/concerns Marchia Bond, RN Navigator Cardiac Imaging [place] [facility] [phone] Office  [phone] Cell

## 2019-09-05 NOTE — Progress Notes (Addendum)
Cardiology Office Note:    Date:  09/06/2019   ID:  Patrick Moyer, DOB 07-23-1951, MRN [id number]  PCP:  Dineen Kid, MD  Cardiologist:  Ena Dawley  Referring MD: Lawerance Cruel, MD   Reason for visit: Evaluation for possible Marfan syndrome.  History of Present Illness:    Patrick Moyer is a 68 y.o. male with a hx of ascending aortic aneurysm, Marfanoid habitus, the patient has a significant family history of Marfan syndrome, his brother was diagnosed with Marfan syndrome, his father died of ascending aortic dissection and coronary dissection, his daughter is unusually tall with longer arm span then body legs when she was growing up.  The patient is fairly active, and denies dyspnea on exertion however gets occasional sharp chest pain across his chest.  Denies any lower extremity edema orthopnea proximal nocturnal dyspnea.  Past Medical History:  Diagnosis Date  . ALLERGIC RHINITIS   . Aortic root dilatation ([place])   . Aortic root enlargement (HCC)    Upper Limits  . Arthritis pain of wrist   . Bell's palsy   . Benign localized prostatic hyperplasia with lower urinary tract symptoms (LUTS)   . Depression   . Diverticulosis   . ED (erectile dysfunction)   . GERD (gastroesophageal reflux disease)   . High cholesterol   . History of pneumonia   . HSV-1 infection   . Low blood pressure   . Marfan's syndrome   . Multiple allergies   . Non-celiac gluten sensitivity   . OSA on CPAP   . Other chronic pain   . Pain in right wrist   . Pigment dispersion syndrome   . Polyneuropathy    left leg  . 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 INH x 1 year  . Post-void dribbling   . Pulmonary nodules   . Radiculopathy of leg   . Sleep apnea    BIPAP    Past Surgical History:  Procedure Laterality Date  . APPENDECTOMY  2010  . [place]  . FINGER EXPLORATION Left    second left finger  . INGUINAL  HERNIA REPAIR  1978  . NASAL SEPTUM SURGERY  1975   nasal  age 36  . WISDOM TOOTH EXTRACTION  1975    Current Medications: Current Meds  Medication Sig  . acetaminophen (TYLENOL) 500 MG tablet Take 500 mg by mouth at bedtime.  Marland Kitchen acyclovir ointment (ZOVIRAX) 5 % Apply 1 application topically every 3 (three) hours.  . B Complex Vitamins (B COMPLEX 100 PO) Take by mouth daily.  . celecoxib (CELEBREX) 200 MG capsule Take 200 mg by mouth 2 (two) times daily.  . cholecalciferol (VITAMIN D) 1000 UNITS tablet Take 1,000 Units by mouth 2 (two) times daily.    Marland Kitchen glucosamine-chondroitin 500-400 MG tablet Take 1 tablet by mouth daily.   Marland Kitchen guaiFENesin (MUCINEX) 600 MG 12 hr tablet Take 600 mg by mouth 2 (two) times daily. Reported on 04/25/2016  . KRILL OIL PO Take by mouth at bedtime.   Marland Kitchen L-Lysine 500 MG CAPS Take 1 capsule by mouth 2 (two) times daily.  . montelukast (SINGULAIR) 10 MG tablet Take 10 mg by mouth at bedtime.  Marland Kitchen nystatin (MYCOSTATIN) 100000 UNIT/ML suspension Take 5 mLs by mouth as needed.   . S-Adenosylmethionine (SAM-E) 400 MG TABS Take 1 tablet by mouth at  bedtime.  . tamsulosin (FLOMAX) 0.4 MG CAPS capsule Take 0.4 mg by mouth.     Allergies:   Other, Cialis [tadalafil], Sildenafil, and Soy protein [place] oil]   Social History   Socioeconomic History  . Marital status: Married    Spouse name: Not on file  . Number of children: 2  . Years of education: Not on file  . Highest education level: Not on file  Occupational History  . 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 Financial resource strain: Not on file  . Food insecurity    Worry: Not on file    Inability: Not on file  . Transportation needs    Medical: Not on file    Non-medical: Not on file  Tobacco Use  . Smoking status: Never Smoker  . Smokeless tobacco: Never Used  Substance and Sexual Activity  . Alcohol use: No  . Drug use: No  . Sexual activity: Not on file   Lifestyle  . Physical activity    Days per week: Not on file    Minutes per session: Not on file  . Stress: Not on file  Relationships  . Social Musician on phone: Not on file    Gets together: Not on file    Attends religious service: Not on file    Active member of club or organization: Not on file    Attends meetings of clubs or organizations: Not on file    Relationship status: Not on file  Other Topics Concern  . Not on file  Social History Narrative  . Not on file     Family History: The patient's family history includes Aortic aneurysm in his father; Bipolar disorder in his brother; CAD in his father; Glaucoma in his brother; Hypertension in his father; Marfan syndrome in his brother; Obesity in his brother; Osteoarthritis in his sister; Ovarian cancer in his mother.  ROS:   Please see the history of present illness.    All other systems reviewed and are negative.  EKGs/Labs/Other Studies Reviewed:    The following studies were reviewed today:  EKG:  EKG is ordered today.  The ekg ordered today demonstrates normal sinus rhythm, LVH, borderline EKG, no prior available for comparison, personally reviewed.  Recent Labs: No results found for requested labs within last 8760 hours.  Recent Lipid Panel No results found for: CHOL, TRIG, HDL, CHOLHDL, VLDL, LDLCALC, LDLDIRECT  Physical Exam:    VS:  BP 122/76   Pulse 64   Ht 6' 3.6" (1.92 m)   Wt 207 lb 6.4 oz (94.1 kg)   SpO2 97%   BMI 25.51 kg/m     Wt Readings from Last 3 Encounters:  09/05/19 207 lb 6.4 oz (94.1 kg)  10/16/16 191 lb (86.6 kg)  10/16/16 191 lb (86.6 kg)     GEN: Well nourished, very tall male with long arm span, in no acute distress HEENT: Normal NECK: No JVD; No carotid bruits LYMPHATICS: No lymphadenopathy CARDIAC: RRR, 2 out of 6 diastolic murmurs, rubs, gallops RESPIRATORY:  Clear to auscultation without rales, wheezing or rhonchi  ABDOMEN: Soft, non-tender,  non-distended MUSCULOSKELETAL:  No edema; No deformity  SKIN: Warm and dry NEUROLOGIC:  Alert and oriented x 3 PSYCHIATRIC:  Normal affect   ASSESSMENT:    1. Ascending aortic aneurysm (HCC)   2. Abnormal finding on chest xray   3. Chest pain of uncertain etiology   4. Chest  pain, unspecified type   5. Marfanoid habitus    PLAN:    In order of problems listed above:  1. Ascending aortic aneurysm in the settings of marfanoid body habitus and family history of Marfan syndrome and ascending aortic aneurysm.  Chest CTA on May 07, 2018 showed ascending aortic dimension 41 mm and aortic root dimension at the sinus level 43 mm.  We will obtain a new scan to compare. 2. We will also obtain echocardiogram to evaluate for possible aortic insufficiency and also to obtain baseline echocardiogram as we do not have any on chart. 3. We will obtain coronary CTA to evaluate for coronary artery disease and possible ischemia given history of chest pain. 4. Hyperlipidemia with HDL 45, triglycerides 71 and LDL 109, if we find any evidence of coronary artery disease we will start statin.  Medication Adjustments/Labs and Tests Ordered: Current medicines are reviewed at length with the patient today.  Concerns regarding medicines are outlined above.  Orders Placed This Encounter  Procedures  . CT ANGIO CHEST AORTA W &/OR WO CONTRAST  . CT CORONARY MORPH W/CTA COR W/SCORE W/CA W/CM &/OR WO/CM  . CT CORONARY FRACTIONAL FLOW RESERVE DATA PREP  . CT CORONARY FRACTIONAL FLOW RESERVE FLUID ANALYSIS  . EKG 12-Lead  . ECHOCARDIOGRAM COMPLETE   No orders of the defined types were placed in this encounter.   Patient Instructions  Your physician recommends that you continue on your current medications as directed. Please refer to the Current Medication list given to you today.  Your physician has requested that you have an echocardiogram. Echocardiography is a painless test that uses sound waves to create  images of your heart. It provides your doctor with information about the size and shape of your heart and how well your heart's chambers and valves are working. This procedure takes approximately one hour. There are no restrictions for this procedure.   Your physician wants you to follow-up in: YEAR WITH DR Johnell ComingsNELSON  You will receive a reminder letter in the mail two months in advance. If you don't receive a letter, please call our office to schedule the follow-up appointment.   Your cardiac CT will be scheduled at one of the below locations:   [facility] [address] [place], [place] [address] [phone]  If scheduled at [facility], please arrive at the [facility] main entrance of [facility] 30-45 minutes prior to test start time. Proceed to the [facility] Radiology Department (first floor) to check-in and test prep.  If scheduled at [facility], please arrive 15 mins early for check-in and test prep.  Please follow these instructions carefully (unless otherwise directed):  Hold all erectile dysfunction medications at least 5 days prior to test.  On the Night Before the Test: . Be sure to Drink plenty of water. . Do not consume any caffeinated/decaffeinated beverages or chocolate 12 hours prior to your test. . Do not take any antihistamines 12 hours prior to your test. On the Day of the Test: . Drink plenty of water. Do not drink any water within one hour of the test. . Do not eat any food 4 hours prior to the test. . You may take your regular medications prior to the test.  . Take metoprolol (Lopressor) two hours prior to test. . HOLD Furosemide/Hydrochlorothiazide morning of the test. . FEMALES- please wear underwire-free bra if available   *For Clinical Staff only. Please instruct patient the following:*        -  Drink plenty of water       -Hold Furosemide/hydrochlorothiazide morning of the test       -      After the Test: . Drink plenty of water. . After receiving IV contrast, you may experience a mild flushed feeling. This is normal. . On occasion, you may experience a mild rash up to 24 hours after the test. This is not dangerous. If this occurs, you can take Benadryl 25 mg and increase your fluid intake. . If you experience trouble breathing, this can be serious. If it is severe call 911 IMMEDIATELY. If it is mild, please call our office. . If you take any of these medications: Glipizide/Metformin, Avandament, Glucavance, please do not take 48 hours after completing test unless otherwise instructed.    Please contact the cardiac imaging nurse navigator should you have any questions/concerns Rockwell AlexandriaSara Wallace, RN Navigator Cardiac Imaging [facility] [phone] Office  [phone] Cell     Signed, Tobias AlexanderKatarina Noma Quijas, MD  09/06/2019 9:01 PM    [facility]

## 2019-09-10 ENCOUNTER — Ambulatory Visit (HOSPITAL_COMMUNITY): Payer: Medicare PPO | Attending: Cardiology

## 2019-09-10 ENCOUNTER — Other Ambulatory Visit: Payer: Self-pay

## 2019-09-10 DIAGNOSIS — R079 Chest pain, unspecified: Secondary | ICD-10-CM

## 2019-09-10 DIAGNOSIS — R0789 Other chest pain: Secondary | ICD-10-CM | POA: Diagnosis present

## 2019-09-24 ENCOUNTER — Telehealth: Payer: Self-pay | Admitting: *Deleted

## 2019-09-24 DIAGNOSIS — I712 Thoracic aortic aneurysm, without rupture: Secondary | ICD-10-CM

## 2019-09-24 DIAGNOSIS — I7121 Aneurysm of the ascending aorta, without rupture: Secondary | ICD-10-CM

## 2019-09-24 DIAGNOSIS — R079 Chest pain, unspecified: Secondary | ICD-10-CM

## 2019-09-24 NOTE — Telephone Encounter (Signed)
  Burnetta Sabin M, LPN        Lvmom to call and schedule the tests. Also patient is going to need labs.   Previous Messages  ----- Message -----  From: Nuala Alpha, LPN  Sent: 1/61/0960  3:48 PM EDT  To: Armando Gang  Subject: needs coronary CT and CT Angio/chest schedul*   Here is the pt we were just talking about who just got approved and needs his Chest CT Angio Aorta and Coronary CT scheduled, per Dr Meda Coffee.  Can you please schedule and shoot me the dates?   Thanks for always being there for me,  Karlene Einstein              BMET order needed for this pts Coronary CT and CT angio chest aorta to be scheduled per CT Scheduler Mack Guise.  Order placed and scheduler made aware to arrange lab and needed test.

## 2019-09-26 NOTE — Telephone Encounter (Addendum)
° °  Dr. Gilford Rile returned call states he can get labs done at another facility. Please fax lab order to him at [phone]  He is out of town right now, can do CT scan 1014--> 10/18

## 2019-09-26 NOTE — Telephone Encounter (Signed)
CT Scheduler Ivin Booty left this pt a message to call the office back to arrange lab appt and Coronary CT.  He will need to have lab done here when he's back in town, or have a ISTAT creatinine done the day of his CT.

## 2019-09-26 NOTE — Telephone Encounter (Signed)
Will forward this information to our [place], for she has been trying to get in touch with this pt to arrange this test.

## 2019-09-30 NOTE — Telephone Encounter (Signed)
Left a detailed message on the pts VM stating that we wrote the order for him to have his BMET drawn and faxed this to the requesting facility he wanted drawn at, and all he needs to do is go there have his lab done and have them faxed the results to Dr. Meda Coffee, at fax number written on lab order.  Advised the pt to call back with any further questions or concerns regarding message left.

## 2019-09-30 NOTE — Telephone Encounter (Signed)
Order for BMET written for this pt and will be faxed to the number provided in this message.  BMET is for upcoming CT Angio and Coronary CT.  Faxed to [phone] attention to Dr. Gilford Rile, and advised them to fax results to Dr. Meda Coffee at [phone].

## 2019-10-08 ENCOUNTER — Telehealth (HOSPITAL_COMMUNITY): Payer: Self-pay | Admitting: Emergency Medicine

## 2019-10-08 ENCOUNTER — Other Ambulatory Visit: Payer: Self-pay | Admitting: Cardiology

## 2019-10-08 NOTE — Telephone Encounter (Signed)
Will route this message to Dr. Meda Coffee and our [place], as a general FYI about cancellation of images for tomorrow, due to denial for coverage by his insurance carrier. CT Scheduler will continue to follow and reschedule accordingly.

## 2019-10-08 NOTE — Telephone Encounter (Signed)
Cancel Ct Angio Chest  Received: Today Message Contents  Jerlyn Ly, LPN        Ct Angio was cancel due to insurance had not approved.  Also cancel the Cardiac Ct due to the need to be done together. Once it has been approved I will reschedule.     Cancel Cardic Ct  Received: Today Message Contents  Burnetta Sabin M, LPN        Ct Angio had to be cancel due to insurance had not approved.  Also cancel the Cardiac Ct due to they need to be done together. Once it has been approved I will reschedule.

## 2019-10-08 NOTE — Telephone Encounter (Signed)
Reaching out to patient to offer assistance regarding upcoming cardiac imaging study; pt verbalizes understanding of appt date/time, parking situation and where to check in, pre-test NPO status and medications ordered, and verified current allergies; name and call back number provided for further questions should they arise Marchia Bond RN [facility] [phone] office [phone] cell  States had labs drawn 1 hr ago.  Also noted that he has a dentist appt at [address] away.

## 2019-10-09 ENCOUNTER — Ambulatory Visit (HOSPITAL_COMMUNITY): Payer: Medicare PPO

## 2019-10-09 LAB — BASIC METABOLIC PANEL
BUN/Creatinine Ratio: 19 (ref 10–24)
BUN: 16 mg/dL (ref 8–27)
CO2: 26 mmol/L (ref 20–29)
Calcium: 9.5 mg/dL (ref 8.6–10.2)
Chloride: 106 mmol/L (ref 96–106)
Creatinine, Ser: 0.84 mg/dL (ref 0.76–1.27)
GFR calc Af Amer: 105 mL/min/{1.73_m2} (ref >59)
GFR calc non Af Amer: 91 mL/min/{1.73_m2} (ref >59)
Glucose: 79 mg/dL (ref 65–99)
Potassium: 4.1 mmol/L (ref 3.5–5.2)
Sodium: 142 mmol/L (ref 134–144)

## 2019-10-09 LAB — SPECIMEN STATUS REPORT

## 2019-10-10 ENCOUNTER — Telehealth: Payer: Self-pay | Admitting: Cardiology

## 2019-10-10 NOTE — Telephone Encounter (Signed)
Left message to call office

## 2019-10-10 NOTE — Telephone Encounter (Signed)
°  Patient calling for lab results from 10/14 draw done at [place]

## 2019-10-15 NOTE — Telephone Encounter (Signed)
Result Notes for Specimen status report  Notes recorded by Jeanann Lewandowsky, RMA on 10/13/2019 at 1:17 PM EDT  The patient has been notified of the result and verbalized understanding. All questions (if any) were answered.  Jeanann Lewandowsky, [place] 10/13/2019 1:17 PM   ------   Notes recorded by Dorothy Spark, MD on 10/13/2019 at 1:15 PM EDT  Normal BMP     Pt made aware of lab result, as reviewed by Dr. Meda Coffee and noted above, on 10/19, by Triage CMA [facility].

## 2019-10-16 ENCOUNTER — Telehealth: Payer: Self-pay | Admitting: *Deleted

## 2019-10-16 DIAGNOSIS — I712 Thoracic aortic aneurysm, without rupture: Secondary | ICD-10-CM

## 2019-10-16 DIAGNOSIS — R072 Precordial pain: Secondary | ICD-10-CM

## 2019-10-16 DIAGNOSIS — R9389 Abnormal findings on diagnostic imaging of other specified body structures: Secondary | ICD-10-CM

## 2019-10-16 DIAGNOSIS — I7121 Aneurysm of the ascending aorta, without rupture: Secondary | ICD-10-CM

## 2019-10-16 DIAGNOSIS — R079 Chest pain, unspecified: Secondary | ICD-10-CM

## 2019-10-16 NOTE — Telephone Encounter (Signed)
Per Coronary CT scheduler Mack Guise, this pt will need a new Coronary CT order placed, for his recent one has expired.  Pt is to have a Coronary CT done as well as CT Angio Chest Aorta, at the same time, as Dr. Meda Coffee ordered at his last OV on 09/05/19.  Order is placed and Mack Guise will be calling the pt back, to have this rescheduled.

## 2019-10-21 ENCOUNTER — Telehealth (HOSPITAL_COMMUNITY): Payer: Self-pay | Admitting: Emergency Medicine

## 2019-10-21 NOTE — Telephone Encounter (Signed)
Reaching out to patient to offer assistance regarding upcoming cardiac imaging study; pt verbalizes understanding of appt date/time, parking situation and where to check in, pre-test NPO status and medications ordered, and verified current allergies; name and call back number provided for further questions should they arise Ameyah Bangura RN Navigator Cardiac Imaging [facility] [phone] office [phone] cell 

## 2019-10-22 ENCOUNTER — Ambulatory Visit (HOSPITAL_COMMUNITY)
Admission: RE | Admit: 2019-10-22 | Discharge: 2019-10-22 | Disposition: A | Discharge status: Home / Self Care | Payer: Medicare PPO | Source: Ambulatory Visit | Attending: Cardiology | Admitting: Cardiology

## 2019-10-22 ENCOUNTER — Encounter: Payer: Medicare PPO | Admitting: *Deleted

## 2019-10-22 ENCOUNTER — Encounter (HOSPITAL_COMMUNITY): Payer: Self-pay

## 2019-10-22 ENCOUNTER — Other Ambulatory Visit: Payer: Self-pay

## 2019-10-22 DIAGNOSIS — R072 Precordial pain: Secondary | ICD-10-CM

## 2019-10-22 DIAGNOSIS — I712 Thoracic aortic aneurysm, without rupture: Secondary | ICD-10-CM | POA: Diagnosis present

## 2019-10-22 DIAGNOSIS — I7121 Aneurysm of the ascending aorta, without rupture: Secondary | ICD-10-CM

## 2019-10-22 DIAGNOSIS — R079 Chest pain, unspecified: Secondary | ICD-10-CM

## 2019-10-22 DIAGNOSIS — Z006 Encounter for examination for normal comparison and control in clinical research program: Secondary | ICD-10-CM

## 2019-10-22 DIAGNOSIS — R9389 Abnormal findings on diagnostic imaging of other specified body structures: Secondary | ICD-10-CM | POA: Insufficient documentation

## 2019-10-22 MED ORDER — NITROGLYCERIN 0.4 MG SL SUBL
SUBLINGUAL_TABLET | SUBLINGUAL | Status: AC
  Filled 2019-10-22: qty 2

## 2019-10-22 MED ORDER — NITROGLYCERIN 0.4 MG SL SUBL
0.8000 mg | SUBLINGUAL_TABLET | Freq: Once | SUBLINGUAL | Status: AC
  Administered 2019-10-22: 15:00:00 0.8 mg via SUBLINGUAL

## 2019-10-22 MED ORDER — IOHEXOL 350 MG/ML SOLN
100.0000 mL | Freq: Once | INTRAVENOUS | Status: AC | PRN
  Administered 2019-10-22: 100 mL via INTRAVENOUS

## 2019-10-22 NOTE — Research (Signed)
CADFEM  STUDY      Subject Name:   Patrick Moyer    Subject met inclusion and exclusion criteria. The informed consent form, study requirements and expectations were reviewed with the subject and questions and concerns were addressed prior to the signing of the consent form. The subject verbalized understanding of the trial requirements. The subject agreed to participate in the CADFEM trial and signed the informed consent. The informed consent was obtained prior to performance of any protocol-specific procedures for the subject. A copy of the signed informed consent was given to the subject and a copy was placed in the subject's medical record.  Kenya Chalmers, Research Assistant  10/22/19/200013:30 

## 2019-10-23 ENCOUNTER — Telehealth: Payer: Self-pay | Admitting: *Deleted

## 2019-10-23 DIAGNOSIS — E785 Hyperlipidemia, unspecified: Secondary | ICD-10-CM

## 2019-10-23 DIAGNOSIS — I7121 Aneurysm of the ascending aorta, without rupture: Secondary | ICD-10-CM

## 2019-10-23 DIAGNOSIS — I712 Thoracic aortic aneurysm, without rupture: Secondary | ICD-10-CM

## 2019-10-23 DIAGNOSIS — I25119 Atherosclerotic heart disease of native coronary artery with unspecified angina pectoris: Secondary | ICD-10-CM

## 2019-10-23 MED ORDER — ROSUVASTATIN CALCIUM 5 MG PO TABS: 5.0000 mg | ORAL_TABLET | Freq: Every day | ORAL | 1 refills | Status: DC

## 2019-10-23 NOTE — Telephone Encounter (Signed)
Spoke with the pt and endorsed to him his Coronary CT results and CT Angio results per Dr. Meda Coffee.  Informed the pt that per Dr Meda Coffee, she recommends that we start him on low dose rosuvastatin 5 mg po daily, and recheck his lipids and LFTs in 2 months.  Confirmed the pharmacy of choice with the pt.  Scheduled the pt to come in for lipids and LFTs in 2 months on 12/29/2019 at 0830.  Pt aware to come fasting to this lab appt.  Pt verbalized understanding and agrees with this plan.

## 2019-10-23 NOTE — Telephone Encounter (Signed)
Result Notes for CT CORONARY MORPH W/CTA COR W/SCORE W/CA W/CM &/OR WO/CM  Notes recorded by Dorothy Spark, MD on 10/23/2019 at 4:08 PM EDT  I would start him on a low dose rosuvastatin 5 mg po daily and check LFTs and lipids in 1-2 months  ------

## 2019-10-23 NOTE — Telephone Encounter (Signed)
-----   Message from Dorothy Spark, MD sent at 10/23/2019  4:08 PM EDT ----- Mildly dilated aortic root at the sinus level with maximum diameter 43 mm, upper normal size of the ascending aorta measuring 40 mm. When compared to the prior study from 05/07/2018 there has been no change. Minimal CAD.

## 2019-12-25 ENCOUNTER — Other Ambulatory Visit: Payer: Medicare PPO | Admitting: *Deleted

## 2019-12-25 ENCOUNTER — Other Ambulatory Visit: Payer: Self-pay

## 2019-12-25 DIAGNOSIS — E785 Hyperlipidemia, unspecified: Secondary | ICD-10-CM

## 2019-12-25 DIAGNOSIS — I7121 Aneurysm of the ascending aorta, without rupture: Secondary | ICD-10-CM

## 2019-12-25 DIAGNOSIS — I25119 Atherosclerotic heart disease of native coronary artery with unspecified angina pectoris: Secondary | ICD-10-CM

## 2019-12-25 DIAGNOSIS — R079 Chest pain, unspecified: Secondary | ICD-10-CM

## 2019-12-25 DIAGNOSIS — I712 Thoracic aortic aneurysm, without rupture: Secondary | ICD-10-CM

## 2019-12-25 LAB — LIPID PANEL
Chol/HDL Ratio: 2.4 ratio (ref 0.0–5.0)
Cholesterol, Total: 128 mg/dL (ref 100–199)
HDL: 54 mg/dL (ref >39)
LDL Chol Calc (NIH): 62 mg/dL (ref 0–99)
Triglycerides: 52 mg/dL (ref 0–149)
VLDL Cholesterol Cal: 12 mg/dL (ref 5–40)

## 2019-12-25 LAB — HEPATIC FUNCTION PANEL
ALT: 31 IU/L (ref 0–44)
AST: 24 IU/L (ref 0–40)
Albumin: 4.2 g/dL (ref 3.8–4.8)
Alkaline Phosphatase: 55 IU/L (ref 39–117)
Bilirubin Total: 0.5 mg/dL (ref 0.0–1.2)
Bilirubin, Direct: 0.17 mg/dL (ref 0.00–0.40)
Total Protein: 6.3 g/dL (ref 6.0–8.5)

## 2019-12-29 ENCOUNTER — Other Ambulatory Visit: Payer: Medicare PPO

## 2020-01-09 MED ORDER — EZETIMIBE 10 MG PO TABS: 10.0000 mg | ORAL_TABLET | Freq: Every day | ORAL | 3 refills | Status: DC

## 2020-01-19 ENCOUNTER — Telehealth: Payer: Self-pay

## 2020-01-19 NOTE — Telephone Encounter (Signed)
I spoke to the patient with Dr Lindaann Slough recommendation to stop Zetia for 2 weeks, see if face flushing resides, resume Zetia and let us know progress.  He verbalized understanding.

## 2020-01-23 ENCOUNTER — Ambulatory Visit: Payer: Medicare PPO

## 2020-01-29 ENCOUNTER — Ambulatory Visit: Payer: Medicare PPO

## 2020-02-13 ENCOUNTER — Ambulatory Visit: Payer: Medicare PPO

## 2020-03-24 ENCOUNTER — Other Ambulatory Visit: Payer: Self-pay | Admitting: Urology

## 2020-04-28 ENCOUNTER — Encounter (HOSPITAL_BASED_OUTPATIENT_CLINIC_OR_DEPARTMENT_OTHER): Payer: Self-pay | Admitting: Urology

## 2020-04-28 ENCOUNTER — Other Ambulatory Visit: Payer: Self-pay

## 2020-04-28 NOTE — Progress Notes (Addendum)
ADDENDUM:  Chart reviewed by anesthesia, Jodell Cipro PA, ok to proceed.   Spoke w/ via phone for pre-op interview--- PT Lab needs dos---- no              Lab results------ current ekg in chart/ epic COVID test ------ 05-01-2020 @ 1225 Arrive at ------- 0900 NPO after ------ [place] Medications to take morning of surgery ----- Gabapentin, Prilosec, Allegra, Flonase spray w/ sips of water Diabetic medication ----- n/a Patient Special Instructions ----- reviewed [facility] guidelines and visitor policy.  Asked pt to being home medications in original prescription bottle.  And also bring cpap/ mask/ tubing Pre-Op special Istructions ----- n/a Patient verbalized understanding of instructions that were given at this phone interview. Patient denies shortness of breath, chest pain, fever, cough a this phone interview.   Anesthesia Review:  Marfanoid Habitus w/ FH Marfan syndrome,  AAA, hx positive PPD age 4 treated w/ INH for one year. Chart to be reviewed by Jodell Cipro PA.  PCP: Dr C. Duane Lope Cardiologist : Dr Eloy End (lov 09-05-2019 epic) Chest x-ray : CT chest/ CTA 10-22-2019 epic EKG :  09-05-2019 epic Echo : 09-10-2019 epic Cardiac Cath :  no Sleep Study/ CPAP :  YES/ YES Fasting Blood Sugar :      / Checks Blood Sugar -- times a day:   N/A Blood Thinner/ Instructions Maurice Small Dose: NO ASA / Instructions/ Last Dose :  NO

## 2020-05-01 ENCOUNTER — Other Ambulatory Visit (HOSPITAL_COMMUNITY): Payer: Medicare PPO

## 2020-05-01 ENCOUNTER — Other Ambulatory Visit (HOSPITAL_COMMUNITY)
Admission: RE | Admit: 2020-05-01 | Discharge: 2020-05-01 | Disposition: A | Discharge status: Home / Self Care | Payer: Medicare PPO | Source: Ambulatory Visit | Attending: Urology | Admitting: Urology

## 2020-05-01 DIAGNOSIS — Z01812 Encounter for preprocedural laboratory examination: Secondary | ICD-10-CM | POA: Insufficient documentation

## 2020-05-01 DIAGNOSIS — Z20822 Contact with and (suspected) exposure to covid-19: Secondary | ICD-10-CM | POA: Diagnosis not present

## 2020-05-01 LAB — SARS CORONAVIRUS 2 (TAT 6-24 HRS): SARS Coronavirus 2: NEGATIVE

## 2020-05-05 ENCOUNTER — Observation Stay (HOSPITAL_BASED_OUTPATIENT_CLINIC_OR_DEPARTMENT_OTHER)
Admission: RE | Admit: 2020-05-05 | Discharge: 2020-05-06 | Disposition: A | Discharge status: Home / Self Care | Payer: Medicare PPO | Attending: Urology | Admitting: Urology

## 2020-05-05 ENCOUNTER — Encounter (HOSPITAL_BASED_OUTPATIENT_CLINIC_OR_DEPARTMENT_OTHER): Payer: Self-pay | Admitting: Urology

## 2020-05-05 ENCOUNTER — Ambulatory Visit (HOSPITAL_BASED_OUTPATIENT_CLINIC_OR_DEPARTMENT_OTHER): Payer: Medicare PPO | Admitting: Physician Assistant

## 2020-05-05 ENCOUNTER — Encounter (HOSPITAL_BASED_OUTPATIENT_CLINIC_OR_DEPARTMENT_OTHER)
Admission: RE | Disposition: A | Discharge status: Home / Self Care | Payer: Self-pay | Source: Home / Self Care | Attending: Urology

## 2020-05-05 DIAGNOSIS — G4733 Obstructive sleep apnea (adult) (pediatric): Secondary | ICD-10-CM | POA: Insufficient documentation

## 2020-05-05 DIAGNOSIS — M1990 Unspecified osteoarthritis, unspecified site: Secondary | ICD-10-CM | POA: Insufficient documentation

## 2020-05-05 DIAGNOSIS — N529 Male erectile dysfunction, unspecified: Secondary | ICD-10-CM | POA: Diagnosis not present

## 2020-05-05 DIAGNOSIS — K219 Gastro-esophageal reflux disease without esophagitis: Secondary | ICD-10-CM | POA: Insufficient documentation

## 2020-05-05 DIAGNOSIS — N401 Enlarged prostate with lower urinary tract symptoms: Secondary | ICD-10-CM | POA: Diagnosis present

## 2020-05-05 DIAGNOSIS — E7800 Pure hypercholesterolemia, unspecified: Secondary | ICD-10-CM | POA: Diagnosis not present

## 2020-05-05 DIAGNOSIS — I2510 Atherosclerotic heart disease of native coronary artery without angina pectoris: Secondary | ICD-10-CM | POA: Insufficient documentation

## 2020-05-05 DIAGNOSIS — Z888 Allergy status to other drugs, medicaments and biological substances status: Secondary | ICD-10-CM | POA: Diagnosis not present

## 2020-05-05 DIAGNOSIS — F329 Major depressive disorder, single episode, unspecified: Secondary | ICD-10-CM | POA: Diagnosis not present

## 2020-05-05 DIAGNOSIS — N138 Other obstructive and reflux uropathy: Secondary | ICD-10-CM | POA: Diagnosis present

## 2020-05-05 DIAGNOSIS — G629 Polyneuropathy, unspecified: Secondary | ICD-10-CM | POA: Diagnosis not present

## 2020-05-05 DIAGNOSIS — R3912 Poor urinary stream: Secondary | ICD-10-CM | POA: Insufficient documentation

## 2020-05-05 HISTORY — DX: Personal history of traumatic brain injury: Z87.820

## 2020-05-05 HISTORY — DX: Other allergic rhinitis: J30.89

## 2020-05-05 HISTORY — DX: Diaphragmatic hernia without obstruction or gangrene: K44.9

## 2020-05-05 HISTORY — DX: Family history of other congenital malformations, deformations and chromosomal abnormalities: Z82.79

## 2020-05-05 HISTORY — DX: Paresthesia of skin: R20.2

## 2020-05-05 HISTORY — DX: Atherosclerotic heart disease of native coronary artery without angina pectoris: I25.10

## 2020-05-05 HISTORY — DX: Unspecified symptoms and signs involving the musculoskeletal system: R29.91

## 2020-05-05 HISTORY — DX: Abdominal aortic aneurysm, without rupture: I71.4

## 2020-05-05 HISTORY — DX: Presence of spectacles and contact lenses: Z97.3

## 2020-05-05 HISTORY — DX: Other chronic pain: G89.29

## 2020-05-05 HISTORY — PX: TRANSURETHRAL RESECTION OF PROSTATE: SHX73

## 2020-05-05 HISTORY — DX: Anesthesia of skin: R20.0

## 2020-05-05 HISTORY — DX: Personal history of other medical treatment: Z92.89

## 2020-05-05 SURGERY — TURP (TRANSURETHRAL RESECTION OF PROSTATE)
Anesthesia: General | Site: Prostate

## 2020-05-05 MED ORDER — DEXAMETHASONE SODIUM PHOSPHATE 10 MG/ML IJ SOLN
INTRAMUSCULAR | Status: AC
  Filled 2020-05-05: qty 1

## 2020-05-05 MED ORDER — HYDROCODONE-ACETAMINOPHEN 5-325 MG PO TABS
ORAL_TABLET | ORAL | Status: AC
  Filled 2020-05-05: qty 1

## 2020-05-05 MED ORDER — MONTELUKAST SODIUM 10 MG PO TABS
10.0000 mg | ORAL_TABLET | Freq: Every day | ORAL | Status: DC
  Administered 2020-05-05: 10 mg via ORAL
  Filled 2020-05-05 (×2): qty 1

## 2020-05-05 MED ORDER — OXYBUTYNIN CHLORIDE 5 MG PO TABS
ORAL_TABLET | ORAL | Status: AC
  Filled 2020-05-05: qty 1

## 2020-05-05 MED ORDER — DIPHENHYDRAMINE HCL 12.5 MG/5ML PO ELIX: 12.5000 mg | ORAL_SOLUTION | Freq: Four times a day (QID) | ORAL | Status: DC | PRN

## 2020-05-05 MED ORDER — LIDOCAINE 2% (20 MG/ML) 5 ML SYRINGE
INTRAMUSCULAR | Status: AC
  Filled 2020-05-05: qty 5

## 2020-05-05 MED ORDER — GABAPENTIN 300 MG PO CAPS
ORAL_CAPSULE | ORAL | Status: AC
  Filled 2020-05-05: qty 3

## 2020-05-05 MED ORDER — OXYCODONE HCL 5 MG/5ML PO SOLN: 5.0000 mg | Freq: Once | ORAL | Status: DC | PRN

## 2020-05-05 MED ORDER — DEXAMETHASONE SODIUM PHOSPHATE 10 MG/ML IJ SOLN
INTRAMUSCULAR | Status: DC | PRN
  Administered 2020-05-05: 10 mg via INTRAVENOUS

## 2020-05-05 MED ORDER — ACETAMINOPHEN 325 MG PO TABS: 650.0000 mg | ORAL_TABLET | ORAL | Status: DC | PRN

## 2020-05-05 MED ORDER — MIDAZOLAM HCL 2 MG/2ML IJ SOLN
INTRAMUSCULAR | Status: AC
  Filled 2020-05-05: qty 2

## 2020-05-05 MED ORDER — HYDROMORPHONE HCL 1 MG/ML IJ SOLN: 0.2500 mg | INTRAMUSCULAR | Status: DC | PRN

## 2020-05-05 MED ORDER — OXYCODONE HCL 5 MG PO TABS: 5.0000 mg | ORAL_TABLET | Freq: Once | ORAL | Status: DC | PRN

## 2020-05-05 MED ORDER — CEFAZOLIN SODIUM-DEXTROSE 2-4 GM/100ML-% IV SOLN
INTRAVENOUS | Status: AC
  Filled 2020-05-05: qty 100

## 2020-05-05 MED ORDER — PROPOFOL 10 MG/ML IV BOLUS
INTRAVENOUS | Status: AC
  Filled 2020-05-05: qty 20

## 2020-05-05 MED ORDER — ONDANSETRON HCL 4 MG/2ML IJ SOLN
INTRAMUSCULAR | Status: DC | PRN
  Administered 2020-05-05: 4 mg via INTRAVENOUS

## 2020-05-05 MED ORDER — ONDANSETRON HCL 4 MG/2ML IJ SOLN
INTRAMUSCULAR | Status: AC
  Filled 2020-05-05: qty 2

## 2020-05-05 MED ORDER — BACITRACIN-NEOMYCIN-POLYMYXIN 400-5-5000 EX OINT
1.0000 "application " | TOPICAL_OINTMENT | Freq: Three times a day (TID) | CUTANEOUS | Status: DC | PRN
  Administered 2020-05-05 (×2): 1 via TOPICAL

## 2020-05-05 MED ORDER — CHLORHEXIDINE GLUCONATE 0.12 % MT SOLN: 15.0000 mL | Freq: Once | OROMUCOSAL | Status: DC

## 2020-05-05 MED ORDER — TAMSULOSIN HCL 0.4 MG PO CAPS
0.4000 mg | ORAL_CAPSULE | Freq: Every evening | ORAL | Status: DC
  Administered 2020-05-05: 0.4 mg via ORAL

## 2020-05-05 MED ORDER — FLUTICASONE PROPIONATE 50 MCG/ACT NA SUSP
1.0000 | Freq: Three times a day (TID) | NASAL | Status: DC
  Filled 2020-05-05: qty 16

## 2020-05-05 MED ORDER — FENTANYL CITRATE (PF) 100 MCG/2ML IJ SOLN
INTRAMUSCULAR | Status: DC | PRN
  Administered 2020-05-05 (×2): 25 ug via INTRAVENOUS
  Administered 2020-05-05: 50 ug via INTRAVENOUS

## 2020-05-05 MED ORDER — BELLADONNA ALKALOIDS-OPIUM 16.2-60 MG RE SUPP
RECTAL | Status: AC
  Filled 2020-05-05: qty 1

## 2020-05-05 MED ORDER — BELLADONNA ALKALOIDS-OPIUM 16.2-60 MG RE SUPP
RECTAL | Status: DC | PRN
  Administered 2020-05-05: 1 via RECTAL

## 2020-05-05 MED ORDER — SODIUM CHLORIDE 0.9 % IR SOLN
3000.0000 mL | Status: DC
  Administered 2020-05-06: 3000 mL

## 2020-05-05 MED ORDER — PROPOFOL 10 MG/ML IV BOLUS
INTRAVENOUS | Status: DC | PRN
  Administered 2020-05-05: 180 mg via INTRAVENOUS

## 2020-05-05 MED ORDER — CEFAZOLIN SODIUM-DEXTROSE 2-4 GM/100ML-% IV SOLN
2.0000 g | Freq: Once | INTRAVENOUS | Status: AC
  Administered 2020-05-05: 2 g via INTRAVENOUS

## 2020-05-05 MED ORDER — MIDAZOLAM HCL 5 MG/5ML IJ SOLN
INTRAMUSCULAR | Status: DC | PRN
  Administered 2020-05-05: 2 mg via INTRAVENOUS

## 2020-05-05 MED ORDER — CEPHALEXIN 500 MG PO CAPS
500.0000 mg | ORAL_CAPSULE | Freq: Two times a day (BID) | ORAL | Status: DC
  Administered 2020-05-05: 500 mg via ORAL
  Filled 2020-05-05 (×2): qty 1

## 2020-05-05 MED ORDER — LIDOCAINE 2% (20 MG/ML) 5 ML SYRINGE
INTRAMUSCULAR | Status: DC | PRN
  Administered 2020-05-05: 100 mg via INTRAVENOUS

## 2020-05-05 MED ORDER — BACITRACIN-NEOMYCIN-POLYMYXIN OINTMENT TUBE
TOPICAL_OINTMENT | CUTANEOUS | Status: AC
  Filled 2020-05-05: qty 14.17

## 2020-05-05 MED ORDER — BELLADONNA ALKALOIDS-OPIUM 16.2-60 MG RE SUPP: 1.0000 | Freq: Four times a day (QID) | RECTAL | Status: DC | PRN

## 2020-05-05 MED ORDER — HYDROCODONE-ACETAMINOPHEN 5-325 MG PO TABS
1.0000 | ORAL_TABLET | ORAL | Status: DC | PRN
  Administered 2020-05-05 – 2020-05-06 (×4): 1 via ORAL

## 2020-05-05 MED ORDER — ONDANSETRON HCL 4 MG/2ML IJ SOLN: 4.0000 mg | INTRAMUSCULAR | Status: DC | PRN

## 2020-05-05 MED ORDER — DIPHENHYDRAMINE HCL 50 MG/ML IJ SOLN: 12.5000 mg | Freq: Four times a day (QID) | INTRAMUSCULAR | Status: DC | PRN

## 2020-05-05 MED ORDER — MORPHINE SULFATE (PF) 4 MG/ML IV SOLN: 2.0000 mg | INTRAVENOUS | Status: DC | PRN

## 2020-05-05 MED ORDER — OXYBUTYNIN CHLORIDE 5 MG PO TABS
5.0000 mg | ORAL_TABLET | Freq: Three times a day (TID) | ORAL | Status: DC | PRN
  Administered 2020-05-05: 5 mg via ORAL

## 2020-05-05 MED ORDER — TAMSULOSIN HCL 0.4 MG PO CAPS
ORAL_CAPSULE | ORAL | Status: AC
  Filled 2020-05-05: qty 1

## 2020-05-05 MED ORDER — FENTANYL CITRATE (PF) 100 MCG/2ML IJ SOLN
INTRAMUSCULAR | Status: AC
  Filled 2020-05-05: qty 2

## 2020-05-05 MED ORDER — SODIUM CHLORIDE 0.9 % IR SOLN
Status: DC | PRN
  Administered 2020-05-05: 6000 mL via INTRAVESICAL

## 2020-05-05 MED ORDER — ORAL CARE MOUTH RINSE: 15.0000 mL | Freq: Once | OROMUCOSAL | Status: DC

## 2020-05-05 MED ORDER — LACTATED RINGERS IV SOLN: INTRAVENOUS | Status: DC

## 2020-05-05 MED ORDER — PANTOPRAZOLE SODIUM 40 MG PO TBEC: 40.0000 mg | DELAYED_RELEASE_TABLET | Freq: Every day | ORAL | Status: DC

## 2020-05-05 MED ORDER — GABAPENTIN 300 MG PO CAPS
900.0000 mg | ORAL_CAPSULE | Freq: Three times a day (TID) | ORAL | Status: DC
  Administered 2020-05-05 (×2): 900 mg via ORAL

## 2020-05-05 MED ORDER — SODIUM CHLORIDE 0.9 % IV SOLN: INTRAVENOUS | Status: DC

## 2020-05-05 SURGICAL SUPPLY — 20 items
BAG DRAIN URO-CYSTO SKYTR STRL (DRAIN) ×2 IMPLANT
BAG URINE DRAIN 2000ML AR STRL (UROLOGICAL SUPPLIES) ×2 IMPLANT
BAND RUBBER #18 3X1/16 STRL (MISCELLANEOUS) IMPLANT
CATH FOLEY 3WAY 30CC 22F (CATHETERS) ×2 IMPLANT
CATH FOLEY 3WAY 30CC 24FR (CATHETERS)
CATH URTH STD 24FR FL 3W 2 (CATHETERS) IMPLANT
GLOVE BIO SURGEON STRL SZ7.5 (GLOVE) ×2 IMPLANT
GOWN STRL REUS W/TWL XL LVL3 (GOWN DISPOSABLE) ×2 IMPLANT
HOLDER FOLEY CATH W/STRAP (MISCELLANEOUS) ×2 IMPLANT
IV NS IRRIG 3000ML ARTHROMATIC (IV SOLUTION) ×4 IMPLANT
LOOP CUT BIPOLAR 24F LRG (ELECTROSURGICAL) ×2 IMPLANT
MANIFOLD NEPTUNE II (INSTRUMENTS) ×2 IMPLANT
PIN SAFETY STERILE (MISCELLANEOUS) IMPLANT
SYR 30ML LL (SYRINGE) ×2 IMPLANT
SYR TOOMEY IRRIG 70ML (MISCELLANEOUS) ×2
SYRINGE TOOMEY IRRIG 70ML (MISCELLANEOUS) ×1 IMPLANT
TRAY CYSTO PACK (CUSTOM PROCEDURE TRAY) ×2 IMPLANT
TUBE CONNECTING 12X1/4 (SUCTIONS) ×2 IMPLANT
TUBING UROLOGY SET (TUBING) ×2 IMPLANT
WATER STERILE IRR 500ML POUR (IV SOLUTION) ×2 IMPLANT

## 2020-05-05 NOTE — Anesthesia Preprocedure Evaluation (Signed)
Anesthesia Evaluation  Patient identified by MRN, date of birth, ID band Patient awake    Reviewed: Allergy & Precautions, NPO status , Patient's Chart, lab work & pertinent test results  Airway Mallampati: II  TM Distance: >3 FB Neck ROM: Full    Dental no notable dental hx. (+) Teeth Intact   Pulmonary sleep apnea and Continuous Positive Airway Pressure Ventilation ,    Pulmonary exam normal breath sounds clear to auscultation       Cardiovascular + CAD  Normal cardiovascular exam Rhythm:Regular Rate:Normal  Mild CAD   Neuro/Psych Polyneuropathy Bell's palsy Radiculopathy  Neuromuscular disease negative psych ROS   GI/Hepatic Neg liver ROS, hiatal hernia, GERD  Medicated and Controlled,  Endo/Other    Renal/GU negative Renal ROS   BPH with lower urinary tract Sx ED    Musculoskeletal  (+) Arthritis , Osteoarthritis,  Hypermobile joints- marfanoid symptoms   Abdominal   Peds  Hematology negative hematology ROS (+)   Anesthesia Other Findings   Reproductive/Obstetrics                             Anesthesia Physical Anesthesia Plan  ASA: III  Anesthesia Plan: General   Post-op Pain Management:    Induction: Intravenous  PONV Risk Score and Plan: 4 or greater and Ondansetron, Dexamethasone and Treatment may vary due to age or medical condition  Airway Management Planned: LMA  Additional Equipment:   Intra-op Plan:   Post-operative Plan: Extubation in OR  Informed Consent: I have reviewed the patients History and Physical, chart, labs and discussed the procedure including the risks, benefits and alternatives for the proposed anesthesia with the patient or authorized representative who has indicated his/her understanding and acceptance.     Dental advisory given  Plan Discussed with: CRNA and Surgeon  Anesthesia Plan Comments:         Anesthesia Quick  Evaluation

## 2020-05-05 NOTE — H&P (Signed)
Urology Preoperative H&P   Chief Complaint: Weak stream  History of Present Illness: Patrick Moyer is a 69 y.o. male with a history of BPH with LUTS and erectile dysfunction.   Last PSA- 0.65 (01/2018)   1. 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 Previously treated with tamsulosin and finasteride. Stopped finasteride citing little benefit. From a urinary standpoint, he reports he a weak force of stream and does not always feel like he is adequately emptying his bladder. He also has more frequent episodes of postvoid dribbling. Nocturia x1-2. Denies interval UTIs, dysuria or gross hematuria.   AUA Symptom Score: More than 50% of the time he has the sensation of not emptying his bladder completely when finished urinating. Less than 50% of the time he has to urinate again fewer than two hours after he has finished urinating. 50% of the time he has to start and stop again several times when he urinates. Less than 20% of the time he finds it difficult to postpone urination. More than 50% of the time he has a weak urinary stream. Less than 20% of the time he has to push or strain to begin urination. He has to get up to urinate 1 time from the time he goes to bed until the time he gets up in the morning.   Calculated AUA Symptom Score: 16   Past Medical History:  Diagnosis Date  . AAA (abdominal aortic aneurysm) [facility])    cardiologist--- dr Eloy End--- CTA 10-22-2019  ascending aorta 83mm and aortic root 55mm  . ALLERGIC RHINITIS   . Arthritis pain of wrist   . Bell's palsy    per pt 1987 left side, 20% residual effect's eyelid  . Benign localized prostatic hyperplasia with lower urinary tract symptoms (LUTS)   . Chronic bilateral low back pain with bilateral sciatica   . Depression   . Diverticulosis   . ED (erectile dysfunction)   . Environmental and seasonal allergies   . Family history of Marfan syndrome    father, paternal uncle, and  brother  . GERD (gastroesophageal reflux disease)   . Hiatal hernia   . High cholesterol   . History of closed head injury    04-28-2020 per pt yrs ago w/ no loc, residual mild memory loss which has resolved  . 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 65   per pt treated with INH for one year and cxr's since have been normal  . HSV-1 infection   . Marfanoid habitus    per pt has wing span is longer than his height, effects eyes (iris), hyperflexibility of ankle and hands,  AAA  . Mild CAD    cardiologist--  dr Delton See---  CTA 10-22-2019  . Non-celiac gluten sensitivity   . Numbness and tingling in both hands    per pt intermittantly due to cervical spine  . OSA on CPAP    auto 8-10,  pt stated uses every night  . 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)  . Polyneuropathy    left leg, lumbar nerve   . Pulmonary nodules    per pt followed by pcp (CTA and CT chest 10-22-2019 , stable)  . Radiculopathy of leg   . Wears glasses     Past Surgical History:  Procedure Laterality Date  . FINGER EXPLORATION Left 1995   left middle finger digital nerve repair  . INGUINAL HERNIA  REPAIR Left 1978  . LAPAROSCOPIC APPENDECTOMY  2010  . NASAL SEPTUM SURGERY  1975  . WISDOM TOOTH EXTRACTION  1975    Allergies:  Allergies  Allergen Reactions  . Other     Adhesive, Caffene-abd pain  . Cialis [Tadalafil]     Caused acute angle glaucoma  . Sildenafil     Caused acute angle glaucoma  . Soy Protein [Soybean Oil]     Gastrointestinal upset    Family History  Problem Relation Age of Onset  . Aortic aneurysm Father        abdominal  . Hypertension Father   . CAD Father   . Ovarian cancer Mother        cysto adenocarcenoma  . Osteoarthritis Sister   . Glaucoma Brother   . Bipolar disorder Brother   . Marfan syndrome Brother   . Obesity Brother     Social History:  reports that he has never smoked. He has never used smokeless tobacco. He reports  that he does not drink alcohol or use drugs.  ROS: A complete review of systems was performed.  All systems are negative except for pertinent findings as noted.  Physical Exam:  Vital signs in last 24 hours:   Constitutional:  Alert and oriented, No acute distress Cardiovascular: Regular rate and rhythm, No JVD Respiratory: Normal respiratory effort, Lungs clear bilaterally GI: Abdomen is soft, nontender, nondistended, no abdominal masses GU: No CVA tenderness Lymphatic: No lymphadenopathy Neurologic: Grossly intact, no focal deficits Psychiatric: Normal mood and affect  Laboratory Data:  No results for input(s): WBC, HGB, HCT, PLT in the last 72 hours.  No results for input(s): NA, K, CL, GLUCOSE, BUN, CALCIUM, CREATININE in the last 72 hours.  Invalid input(s): CO3   No results found for this or any previous visit (from the past 24 hour(s)). Recent Results (from the past 240 hour(s))  SARS CORONAVIRUS 2 (TAT 6-24 HRS) Nasopharyngeal Nasopharyngeal Swab     Status: None   Collection Time: 05/01/20 12:39 PM   Specimen: Nasopharyngeal Swab  Result Value Ref Range Status   SARS Coronavirus 2 NEGATIVE NEGATIVE Final    Comment: (NOTE) SARS-CoV-2 target nucleic acids are NOT DETECTED. The SARS-CoV-2 RNA is generally detectable in upper and lower respiratory specimens during the acute phase of infection. Negative results do not preclude SARS-CoV-2 infection, do not rule out co-infections with other pathogens, and should not be used as the sole basis for treatment or other patient management decisions. Negative results must be combined with clinical observations, patient history, and epidemiological information. The expected result is Negative. Fact Sheet for Patients: HairSlick.no Fact Sheet for Healthcare Providers: quierodirigir.com This test is not yet approved or cleared by the [place] FDA and  has been  authorized for detection and/or diagnosis of SARS-CoV-2 by FDA under an Emergency Use Authorization (EUA). This EUA will remain  in effect (meaning this test can be used) for the duration of the COVID-19 declaration under Section 56 4(b)(1) of the Act, 21 U.S.C. section 360bbb-3(b)(1), unless the authorization is terminated or revoked sooner. Performed at [facility], [address], [place], [place] [address]     Renal Function: No results for input(s): CREATININE in the last 168 hours. CrCl cannot be calculated (Patient's most recent lab result is older than the maximum 21 days allowed.).  Radiologic Imaging: No results found.  I independently reviewed the above imaging studies.  Assessment and Plan Patrick Moyer is a 70 y.o. male  with BPH w/ LUTS  -The risks, benefits and alternatives of cystoscopy with TURP was discussed with the patient. The risks included, but are not limited to, bleeding, urinary tract infection, bladder perforation requiring prolonged catheterization and/or open bladder repair, ureteral injury, ureteral obstruction, urethral stricture disease, new or worsening voiding dysfunction, retrograde ejaculation, MI, CVA, PE, DVT and the inherent risks of general anesthesia. We also discussed the need for Foley catheterization for at least 3 days post-op and the likely need for post-op observation in the hospital following the procedure. The patient voices understanding and wishes to proceed.    Ellison Hughs, MD 05/05/2020, 6:22 AM  [facility] Pager: [phone]

## 2020-05-05 NOTE — Anesthesia Procedure Notes (Signed)
Procedure Name: Intubation Date/Time: 05/05/2020 10:44 AM Performed by: Mahamadou Weltz D, CRNA Pre-anesthesia Checklist: Patient identified, Emergency Drugs available, Suction available and Patient being monitored Patient Re-evaluated:Patient Re-evaluated prior to induction Oxygen Delivery Method: Circle system utilized Preoxygenation: Pre-oxygenation with 100% oxygen Induction Type: IV induction Ventilation: Mask ventilation without difficulty LMA: LMA inserted LMA Size: 5.0 Tube type: Oral Number of attempts: 1 Placement Confirmation: positive ETCO2 and breath sounds checked- equal and bilateral Tube secured with: Tape Dental Injury: Teeth and Oropharynx as per pre-operative assessment

## 2020-05-05 NOTE — Op Note (Signed)
Operative Note  Preoperative diagnosis:  1.  BPH with bladder outlet obstruction  Postoperative diagnosis: 1.  BPH with bladder outlet obstruction  Procedure(s): 1.  Bipolar TURP  Surgeon: Rhoderick Moody, MD  Assistants:  None  Anesthesia:  General  Complications:  None  EBL:  20 mL  Specimens: 1. Prostate chips  Drains/Catheters: 1.  22 French 3-way Foley with 20 mL in the balloon  Intraoperative findings:   1. High riding bladder neck with trilobar prostatic urethral obstruction 2. No other intravesical pathology was seen during cystoscopy  Indication:  Patrick Moyer is a 69 y.o. male with a history of BPH with lower urinary tract symptoms who has been previously treated with tamsulosin and finasteride without much improvement in his voiding symptoms.  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  He has been consented for the above procedures, voices understanding and wishes to proceed.  Description of procedure:  After informed consent was obtained, the patient was brought to the operating room and general anesthesia was administered. The patient was then placed in the dorsolithotomy position and prepped and draped in usual sterile fashion. A timeout was performed. A 23 French rigid cystoscope was then inserted into the urethral meatus and advanced into the bladder under direct vision. A complete bladder survey revealed no intravesical pathology.  Both ureteral orifices were identified and well away from the bladder neck.  The rigid cystoscope was then exchanged for a 26 French resectoscope with a bipolar loop working element.  Starting at the bladder neck and progressing distally to the verumontanum, the prostatic adenoma was systematically resected until a widely patent prostatic urethral channel was created.  All prostate chips were then hand irrigated out of the bladder and sent to pathology for  permanent section.  The resectoscope was then removed and exchanged for a 22 French three-way Foley catheter.  The three-way Foley catheter was then extensively hand irrigated until the irrigant returned clear to light pink.  The catheter was then placed to continuous bladder irrigation and placed on rubber band traction.  He tolerated the procedure well and was transferred to the postanesthesia unit in stable condition.  Plan:  CBI and traction overnight

## 2020-05-05 NOTE — Anesthesia Postprocedure Evaluation (Signed)
Anesthesia Post Note  Patient: Patrick Moyer  Procedure(s) Performed: TRANSURETHRAL RESECTION OF THE PROSTATE (TURP), BIPOLAR (N/A Prostate)     Patient location during evaluation: PACU Anesthesia Type: General Level of consciousness: awake and alert and oriented Pain management: pain level controlled Vital Signs Assessment: post-procedure vital signs reviewed and stable Respiratory status: spontaneous breathing, nonlabored ventilation and respiratory function stable Cardiovascular status: blood pressure returned to baseline and stable Postop Assessment: no apparent nausea or vomiting Anesthetic complications: no    Last Vitals:  Vitals:   05/05/20 1200 05/05/20 1211  BP: 113/65 118/76  Pulse: (!) 57 (!) 48  Resp: 16   Temp:    SpO2: 95% 100%    Last Pain:  Vitals:   05/05/20 1211  TempSrc:   PainSc: 0-No pain                 Kayleann Mccaffery A.

## 2020-05-05 NOTE — Transfer of Care (Signed)
Last Vitals:  Vitals Value Taken Time  BP 108/57 05/05/20 1123  Temp    Pulse 55 05/05/20 1126  Resp 19 05/05/20 1126  SpO2 94 % 05/05/20 1126  Vitals shown include unvalidated device data.  Last Pain:  Vitals:   05/05/20 0938  TempSrc: Oral  PainSc: 0-No pain      Patients Stated Pain Goal: 3 (05/05/20 2628)  Immediate Anesthesia Transfer of Care Note  Patient: Patrick Moyer  Procedure(s) Performed: Procedure(s) (LRB): TRANSURETHRAL RESECTION OF THE PROSTATE (TURP), BIPOLAR (N/A)  Patient Location: PACU  Anesthesia Type: General  Level of Consciousness: sleepy  Airway & Oxygen Therapy: Patient Spontanous Breathing with oral airway remaining and Patient connected to nasal cannula oxygen  Post-op Assessment: Report given to PACU RN and Post -op Vital signs reviewed and stable  Post vital signs: Reviewed and stable  Complications: No apparent anesthesia complications

## 2020-05-06 DIAGNOSIS — N401 Enlarged prostate with lower urinary tract symptoms: Secondary | ICD-10-CM | POA: Diagnosis not present

## 2020-05-06 LAB — BASIC METABOLIC PANEL
Anion gap: 10 (ref 5–15)
BUN: 13 mg/dL (ref 8–23)
CO2: 23 mmol/L (ref 22–32)
Calcium: 8.9 mg/dL (ref 8.9–10.3)
Chloride: 108 mmol/L (ref 98–111)
Creatinine, Ser: 0.64 mg/dL (ref 0.61–1.24)
GFR calc Af Amer: >60 mL/min (ref >60)
GFR calc non Af Amer: >60 mL/min (ref >60)
Glucose, Bld: 124 mg/dL — ABNORMAL HIGH (ref 70–99)
Potassium: 4.1 mmol/L (ref 3.5–5.1)
Sodium: 141 mmol/L (ref 135–145)

## 2020-05-06 LAB — SURGICAL PATHOLOGY

## 2020-05-06 MED ORDER — PHENAZOPYRIDINE HCL 200 MG PO TABS
200.0000 mg | ORAL_TABLET | Freq: Three times a day (TID) | ORAL | 0 refills | Status: DC | PRN
Start: 2020-05-06 — End: 2020-12-27

## 2020-05-06 MED ORDER — HYDROCODONE-ACETAMINOPHEN 5-325 MG PO TABS: 1.0000 | ORAL_TABLET | ORAL | 0 refills | Status: DC | PRN

## 2020-05-06 MED ORDER — CEPHALEXIN 500 MG PO CAPS
500.0000 mg | ORAL_CAPSULE | Freq: Two times a day (BID) | ORAL | 0 refills | Status: AC
Start: 2020-05-06 — End: 2020-05-09

## 2020-05-06 MED ORDER — OXYBUTYNIN CHLORIDE 5 MG PO TABS: 5.0000 mg | ORAL_TABLET | Freq: Three times a day (TID) | ORAL | 1 refills | Status: DC | PRN

## 2020-05-06 MED ORDER — HYDROCODONE-ACETAMINOPHEN 5-325 MG PO TABS
ORAL_TABLET | ORAL | Status: AC
  Filled 2020-05-06: qty 1

## 2020-05-06 NOTE — Discharge Instructions (Signed)
Transurethral Resection of the Prostate, Care After This sheet gives you information about how to care for yourself after your procedure. Your health care provider may also give you more specific instructions. If you have problems or questions, contact your health care provider. What can I expect after the procedure? After the procedure, it is common to have:  Mild pain in your lower abdomen.  Soreness or mild discomfort in your penis from having the catheter inserted during the procedure.  A feeling of urgency when you need to urinate.  A small amount of blood in your urine. You may notice some small blood clots in your urine. These are normal. Follow these instructions at home: Medicines  Take over-the-counter and prescription medicines only as told by your health care provider.  If you were prescribed an antibiotic medicine, take it as told by your health care provider. Do not stop taking the antibiotic even if you start to feel better.  Ask your health care provider if the medicine prescribed to you: ? Requires you to avoid driving or using heavy machinery. ? Can cause constipation. You may need to take actions to prevent or treat constipation, such as:  Take over-the-counter or prescription medicines.  Eat foods that are high in fiber, such as fresh fruits and vegetables, whole grains, and beans.  Limit foods that are high in fat and processed sugars, such as fried or sweet foods.  Do not drive for 24 hours if you were given a sedative during your procedure. Activity   Return to your normal activities as told by your health care provider. Ask your health care provider what activities are safe for you.  Do not lift anything that is heavier than 10 lb (4.5 kg), or the limit that you are told, for 3 weeks after the procedure or until your health care provider says that it is safe.  Avoid intense physical activity for as long as told by your health care provider.  Avoid  sitting for a long time without moving. Get up and move around one or more times every few hours. This helps to prevent blood clots. You may increase your physical activity gradually as you start to feel better. Lifestyle  Do not drink alcohol for as long as told by your health care provider. This is especially important if you are taking prescription pain medicines.  Do not engage in sexual activity until your health care provider says that you can do this. General instructions   Do not take baths, swim, or use a hot tub until your health care provider approves.  Drink enough fluid to keep your urine pale yellow.  Urinate as soon as you feel the need to. Do not try to hold your urine for long periods of time.  If your health care provider approves, you may take a stool softener for 2-3 weeks to prevent you from straining to have a bowel movement.  Wear compression stockings as told by your health care provider. These stockings help to prevent blood clots and reduce swelling in your legs.  Keep all follow-up visits as told by your health care provider. This is important. Contact a health care provider if you have:  Difficulty urinating.  A fever.  Pain that gets worse or does not improve with medicine.  Blood in your urine that does not go away after 1 week of resting and drinking more fluids.  Swelling in your penis or testicles. Get help right away if:  You are unable   to urinate.  You are having more blood clots in your urine instead of fewer.  You have: ? Large blood clots. ? A lot of blood in your urine. ? Pain in your back or lower abdomen. ? Pain or swelling in your legs. ? Chills and you are shaking. ? Difficulty breathing or shortness of breath. Summary  After the procedure, it is common to have a small amount of blood in your urine.  Avoid heavy lifting and intense physical activity for as long as told by your health care provider.  Urinate as soon as you  feel the need to. Do not try to hold your urine for long periods of time.  Keep all follow-up visits as told by your health care provider. This is important. This information is not intended to replace advice given to you by your health care provider. Make sure you discuss any questions you have with your health care provider. Document Revised: 04/02/2019 Document Reviewed: 09/11/2018 Elsevier Patient Education  2020 [employer] Indwelling Urinary Catheter Care, Adult An indwelling urinary catheter is a thin tube that is put into your bladder. The tube helps to drain pee (urine) out of your body. The tube goes in through your urethra. Your urethra is where pee comes out of your body. Your pee will come out through the catheter, then it will go into a bag (drainage bag). Take good care of your catheter so it will work well. How to wear your catheter and bag Supplies needed  Sticky tape (adhesive tape) or a leg strap.  Alcohol wipe or soap and water (if you use tape).  A clean towel (if you use tape).  Large overnight bag.  Smaller bag (leg bag). Wearing your catheter Attach your catheter to your leg with tape or a leg strap.  Make sure the catheter is not pulled tight.  If a leg strap gets wet, take it off and put on a dry strap.  If you use tape to hold the bag on your leg: 1. Use an alcohol wipe or soap and water to wash your skin where the tape made it sticky before. 2. Use a clean towel to pat-dry that skin. 3. Use new tape to make the bag stay on your leg. Wearing your bags You should have been given a large overnight bag.  You may wear the overnight bag in the day or night.  Always have the overnight bag lower than your bladder.  Do not let the bag touch the floor.  Before you go to sleep, put a clean plastic bag in a wastebasket. Then hang the overnight bag inside the wastebasket. You should also have a smaller leg bag that fits under your clothes.  Always wear the  leg bag below your knee.  Do not wear your leg bag at night. How to care for your skin and catheter Supplies needed  A clean washcloth.  Water and mild soap.  A clean towel. Caring for your skin and catheter      Clean the skin around your catheter every day: 1. Wash your hands with soap and water. 2. Wet a clean washcloth in warm water and mild soap. 3. Clean the skin around your urethra.  If you are male:  Gently spread the folds of skin around your vagina (labia).  With the washcloth in your other hand, wipe the inner side of your labia on each side. Wipe from front to back.  If you are male:  Pull back   any skin that covers the end of your penis (foreskin).  With the washcloth in your other hand, wipe your penis in small circles. Start wiping at the tip of your penis, then move away from the catheter.  Move the foreskin back in place, if needed. 4. With your free hand, hold the catheter close to where it goes into your body.  Keep holding the catheter during cleaning so it does not get pulled out. 5. With the washcloth in your other hand, clean the catheter.  Only wipe downward on the catheter.  Do not wipe upward toward your body. Doing this may push germs into your urethra and cause infection. 6. Use a clean towel to pat-dry the catheter and the skin around it. Make sure to wipe off all soap. 7. Wash your hands with soap and water.  Shower every day. Do not take baths.  Do not use cream, ointment, or lotion on the area where the catheter goes into your body, unless your doctor tells you to.  Do not use powders, sprays, or lotions on your genital area.  Check your skin around the catheter every day for signs of infection. Check for: ? Redness, swelling, or pain. ? Fluid or blood. ? Warmth. ? Pus or a bad smell. How to empty the bag Supplies needed  Rubbing alcohol.  Gauze pad or cotton ball.  Tape or a leg strap. Emptying the bag Pour the pee out  of your bag when it is ?- full, or at least 2-3 times a day. Do this for your overnight bag and your leg bag. 1. Wash your hands with soap and water. 2. Separate (detach) the bag from your leg. 3. Hold the bag over the toilet or a clean pail. Keep the bag lower than your hips and bladder. This is so the pee (urine) does not go back into the tube. 4. Open the pour spout. It is at the bottom of the bag. 5. Empty the pee into the toilet or pail. Do not let the pour spout touch any surface. 6. Put rubbing alcohol on a gauze pad or cotton ball. 7. Use the gauze pad or cotton ball to clean the pour spout. 8. Close the pour spout. 9. Attach the bag to your leg with tape or a leg strap. 10. Wash your hands with soap and water. Follow instructions for cleaning the drainage bag:  From the product maker.  As told by your doctor. How to change the bag Supplies needed  Alcohol wipes.  A clean bag.  Tape or a leg strap. Changing the bag Replace your bag when it starts to leak, smell bad, or look dirty. 1. Wash your hands with soap and water. 2. Separate the dirty bag from your leg. 3. Pinch the catheter with your fingers so that pee does not spill out. 4. Separate the catheter tube from the bag tube where these tubes connect (at the connection valve). Do not let the tubes touch any surface. 5. Clean the end of the catheter tube with an alcohol wipe. Use a different alcohol wipe to clean the end of the bag tube. 6. Connect the catheter tube to the tube of the clean bag. 7. Attach the clean bag to your leg with tape or a leg strap. Do not make the bag tight on your leg. 8. Wash your hands with soap and water. General rules   Never pull on your catheter. Never try to take it out. Doing that can hurt   you.  Always wash your hands before and after you touch your catheter or bag. Use a mild, fragrance-free soap. If you do not have soap and water, use hand sanitizer.  Always make sure there are  no twists or bends (kinks) in the catheter tube.  Always make sure there are no leaks in the catheter or bag.  Drink enough fluid to keep your pee pale yellow.  Do not take baths, swim, or use a hot tub.  If you are male, wipe from front to back after you poop (have a bowel movement). Contact a doctor if:  Your pee is cloudy.  Your pee smells worse than usual.  Your catheter gets clogged.  Your catheter leaks.  Your bladder feels full. Get help right away if:  You have redness, swelling, or pain where the catheter goes into your body.  You have fluid, blood, pus, or a bad smell coming from the area where the catheter goes into your body.  Your skin feels warm where the catheter goes into your body.  You have a fever.  You have pain in your: ? Belly (abdomen). ? Legs. ? Lower back. ? Bladder.  You see blood in the catheter.  Your pee is pink or red.  You feel sick to your stomach (nauseous).  You throw up (vomit).  You have chills.  Your pee is not draining into the bag.  Your catheter gets pulled out. Summary  An indwelling urinary catheter is a thin tube that is placed into the bladder to help drain pee (urine) out of the body.  The catheter is placed into the part of the body that drains pee from the bladder (urethra).  Taking good care of your catheter will keep it working properly and help prevent problems.  Always wash your hands before and after touching your catheter or bag.  Never pull on your catheter or try to take it out. This information is not intended to replace advice given to you by your health care provider. Make sure you discuss any questions you have with your health care provider. Document Revised: 04/04/2019 Document Reviewed: 07/27/2017 Elsevier Patient Education  2020 [employer] Inc[employer]  

## 2020-05-06 NOTE — Discharge Summary (Signed)
Date of admission: 05/05/2020  Date of discharge: 05/06/2020  Admission diagnosis: BPH w/ LUTS  Discharge diagnosis: Same  Surgical procedures:  Bipolar TURP  History and Physical: For full details, please see admission history and physical. Briefly, Patrick Moyer is a 69 y.o. year old patient with ongoing BPH w/ LUTS despite tamsulosin and finasteride.  He underwent Bipolar TURP on 05/05/20 to address his obstructing prostatic urethra.   Hospital Course: The patient was monitored on the floor following surgery and had a routine post-op course.  He was discharged home with his Foley catheter with plans for a voiding trial on 05/10/20.  Physical Exam:  General: Alert and oriented CV: RRR, palpable distal pulses Lungs: CTAB, equal chest rise Abdomen: Soft, NTND, no rebound or guarding GU:  Foley in place and draining clear urine w/o CBI Ext: NT, No erythema  Laboratory values: No results for input(s): HGB, HCT in the last 72 hours. Recent Labs    05/06/20 0454  CREATININE 0.64    Disposition: Home  Discharge instruction: The patient was instructed to be ambulatory but told to refrain from heavy lifting, strenuous activity, or driving.  Discharge medications:  Allergies as of 05/06/2020      Reactions   Other    Adhesive, Caffene-abd pain   Cialis [tadalafil]    Caused acute angle glaucoma   Sildenafil    Caused acute angle glaucoma   Soy Protein [employer]]    Gastrointestinal upset      Medication List    TAKE these medications   acetaminophen 500 MG tablet Commonly known as: TYLENOL Take 500 mg by mouth at bedtime.   acyclovir ointment 5 % Commonly known as: ZOVIRAX Apply 1 application topically every 3 (three) hours.   B COMPLEX 100 PO Take by mouth 2 (two) times daily.   celecoxib 200 MG capsule Commonly known as: CELEBREX Take 200 mg by mouth 2 (two) times daily.   cephALEXin 500 MG capsule Commonly known as: KEFLEX Take 1 capsule (500 mg total) by  mouth 2 (two) times daily for 3 days. Start taking the day before your scheduled catheter removal   cholecalciferol 1000 units tablet Commonly known as: VITAMIN D Take 1,000 Units by mouth 2 (two) times daily.   fexofenadine 180 MG tablet Commonly known as: ALLEGRA Take 180 mg by mouth daily.   FIBER PO Take by mouth daily.   fluticasone 50 MCG/ACT nasal spray Commonly known as: FLONASE Place into both nostrils 3 (three) times daily.   gabapentin 300 MG capsule Commonly known as: NEURONTIN Take 900 mg by mouth 3 (three) times daily.   glucosamine-chondroitin 500-400 MG tablet Take 1 tablet by mouth daily.   guaiFENesin 600 MG 12 hr tablet Commonly known as: MUCINEX Take 600 mg by mouth 2 (two) times daily.   HYDROcodone-acetaminophen 5-325 MG tablet Commonly known as: Norco Take 1 tablet by mouth every 4 (four) hours as needed for moderate pain. Notes to patient: May take next dose at 9:00 am   KRILL OIL PO Take by mouth at bedtime.   L-Lysine 500 MG Caps Take 1 capsule by mouth 2 (two) times daily.   montelukast 10 MG tablet Commonly known as: SINGULAIR Take 10 mg by mouth at bedtime.   nystatin 100000 UNIT/ML suspension Commonly known as: MYCOSTATIN Take 5 mLs by mouth as needed.   omeprazole 20 MG capsule Commonly known as: PRILOSEC Take 20 mg by mouth daily.   oxybutynin 5 MG tablet Commonly known as:  DITROPAN Take 1 tablet (5 mg total) by mouth every 8 (eight) hours as needed for bladder spasms.   phenazopyridine 200 MG tablet Commonly known as: Pyridium Take 1 tablet (200 mg total) by mouth 3 (three) times daily as needed (for pain with urination).   Potassium Gluconate 550 (90 K) MG Tabs Take by mouth daily.   ROLAIDS PO Take by mouth as needed.   SAM-e 400 MG Tabs Take 1 tablet by mouth at bedtime.   tamsulosin 0.4 MG Caps capsule Commonly known as: FLOMAX Take 0.4 mg by mouth every evening.   XYLIMELTS MT Use as directed in the mouth  or throat at bedtime.       Followup:  Follow-up Information    [facility] On 05/10/2020.   Why: Catheter removal Contact information: [address] [phone]

## 2020-10-13 ENCOUNTER — Ambulatory Visit
Payer: Medicare PPO | Attending: Student in an Organized Health Care Education/Training Program | Admitting: Student in an Organized Health Care Education/Training Program

## 2020-10-13 ENCOUNTER — Other Ambulatory Visit: Payer: Self-pay

## 2020-10-13 ENCOUNTER — Encounter: Payer: Self-pay | Admitting: Student in an Organized Health Care Education/Training Program

## 2020-10-13 VITALS — BP 138/58 | HR 65 | Temp 98.2°F | Resp 16 | Ht 75.0 in | Wt 190.0 lb

## 2020-10-13 DIAGNOSIS — G8929 Other chronic pain: Secondary | ICD-10-CM | POA: Diagnosis present

## 2020-10-13 DIAGNOSIS — M5412 Radiculopathy, cervical region: Secondary | ICD-10-CM | POA: Diagnosis present

## 2020-10-13 DIAGNOSIS — M47818 Spondylosis without myelopathy or radiculopathy, sacral and sacrococcygeal region: Secondary | ICD-10-CM | POA: Diagnosis present

## 2020-10-13 DIAGNOSIS — R202 Paresthesia of skin: Secondary | ICD-10-CM

## 2020-10-13 DIAGNOSIS — G5702 Lesion of sciatic nerve, left lower limb: Secondary | ICD-10-CM | POA: Diagnosis present

## 2020-10-13 DIAGNOSIS — G894 Chronic pain syndrome: Secondary | ICD-10-CM

## 2020-10-13 DIAGNOSIS — M533 Sacrococcygeal disorders, not elsewhere classified: Secondary | ICD-10-CM | POA: Insufficient documentation

## 2020-10-13 DIAGNOSIS — R200 Anesthesia of skin: Secondary | ICD-10-CM

## 2020-10-13 DIAGNOSIS — M47816 Spondylosis without myelopathy or radiculopathy, lumbar region: Secondary | ICD-10-CM | POA: Insufficient documentation

## 2020-10-13 DIAGNOSIS — M5136 Other intervertebral disc degeneration, lumbar region: Secondary | ICD-10-CM

## 2020-10-13 DIAGNOSIS — M5416 Radiculopathy, lumbar region: Secondary | ICD-10-CM | POA: Insufficient documentation

## 2020-10-13 NOTE — Progress Notes (Signed)
Patient: Patrick Moyer  Service Category: E/M  Provider: Gillis Santa, MD  DOB: 1951-08-15  DOS: 10/13/2020  Referring Provider: Lawerance Cruel, MD  MRN: [id number]  Setting: Ambulatory outpatient  PCP: Lawerance Cruel, MD  Type: New Patient  Specialty: Interventional Pain Management    Location: Office  Delivery: Face-to-face     Primary Reason(s) for Visit: Encounter for initial evaluation of one or more chronic problems (new to examiner) potentially causing chronic pain, and posing a threat to normal musculoskeletal function. (Level of risk: High) CC: Back Pain (left buttock)  HPI  Patrick Moyer is a 69 y.o. year old, male patient, who comes for the first time to our practice referred by Lawerance Cruel, MD for our initial evaluation of his chronic pain. He has Allergic rhinitis due to pollen; Allergic rhinitis due to food; Obstructive sleep apnea; Positive TB test; Abnormal finding on chest xray; Malabsorption; Pain in joint, hand; Muscle weakness (generalized); BPH with urinary obstruction; Lumbar spondylosis; Lumbar radiculopathy; Lumbar degenerative disc disease; Sacroiliac joint pain; SI joint arthritis; Piriformis syndrome of left side; Chronic pain syndrome; Numbness and tingling in left hand; and Cervical radicular pain on their problem list. Today he comes in for evaluation of his Back Pain (left buttock)  Pain Assessment: Location: Left Buttocks Radiating: left lower leg Onset: More than a month ago Duration: Chronic pain Quality: Aching Severity: 2 /10 (subjective, self-reported pain score)  Effect on ADL: difficulty walking, difficulty with prolonged sitting Timing: Intermittent Modifying factors: therapy ball BP: (!) 138/58  HR: 65  Onset and Duration: Gradual Cause of pain: Unknown Severity: Getting better, NAS-11 at its worse: 10/10, NAS-11 at its best: 0/10, NAS-11 now: 1/10 and NAS-11 on the average: 1.5/10 Timing: After activity or exercise Aggravating Factors:  Climbing, Motion, Walking, Walking uphill and Walking downhill Alleviating Factors: Stretching, Medications, Nerve blocks, Resting, Sitting, Sleeping, Warm showers or baths and physical therapy and therapy ball Associated Problems: Fatigue, Numbness, Spasms and Tingling Quality of Pain: Aching, Burning, Intermittent, Deep and Uncomfortable Previous Examinations or Tests: MRI scan, Nerve block, X-rays, Nerve conduction test and Neurosurgical evaluation Previous Treatments: Physical therapy, chiropractic therapy, massage, transforaminal epidural steroid injection x4  Patrick Moyer is a pleasant 69 year old male who presents with a chief complaint of low back and left buttock pain as well as left lateral shin/leg pain.  This is been going on for many years but started specifically during a hiking trip where Patrick Moyer had to pause given the pain radiation that he was feeling down his left leg.  He has been evaluated with interventional radiology at [facility].  He has undergone a series of bilateral L5-S1 transforaminal epidural steroid injections which were helpful.  He has done physical therapy for his condition as well as chiropractic therapy and massage therapy.  He was told to have piriformis syndrome and is doing piriformis release exercises which do help with his pain.  He has been trying to perform aquatic therapy.  His pain radiation is radicular in nature.  It is also endorsing intermittent neck pain and numbness and tingling of his thumb and left index finger that radiates in a dermatomal fashion.  He states that certain positions of his neck do increase in numbness and tingling in his hand which could be related to cervical radiculopathy.   Meds   Current Outpatient Medications:  .  acetaminophen (TYLENOL) 500 MG tablet, Take 500 mg by mouth at bedtime., Disp: , Rfl:  .  acyclovir ointment (ZOVIRAX) 5 %,  Apply 1 application topically every 3 (three) hours. , Disp: , Rfl:  .  B Complex Vitamins (B COMPLEX 100  PO), Take by mouth 2 (two) times daily. , Disp: , Rfl:  .  Ca Carbonate-Mag Hydroxide (ROLAIDS PO), Take by mouth as needed., Disp: , Rfl:  .  celecoxib (CELEBREX) 200 MG capsule, Take 200 mg by mouth 2 (two) times daily., Disp: , Rfl:  .  cholecalciferol (VITAMIN D) 1000 UNITS tablet, Take 5,000 Units by mouth daily. , Disp: , Rfl:  .  fexofenadine (ALLEGRA) 180 MG tablet, Take 180 mg by mouth daily., Disp: , Rfl:  .  FIBER PO, Take by mouth daily., Disp: , Rfl:  .  fluticasone (FLONASE) 50 MCG/ACT nasal spray, Place into both nostrils 3 (three) times daily., Disp: , Rfl:  .  gabapentin (NEURONTIN) 300 MG capsule, Take 900 mg by mouth 3 (three) times daily. 2 tabs with each meal, 3 at bedtime, Disp: , Rfl:  .  glucosamine-chondroitin 500-400 MG tablet, Take 1 tablet by mouth daily. , Disp: , Rfl:  .  guaiFENesin (MUCINEX) 600 MG 12 hr tablet, Take 1,200 mg by mouth. , Disp: , Rfl:  .  L-Lysine 500 MG CAPS, Take 1 capsule by mouth in the morning, at noon, and at bedtime. , Disp: , Rfl:  .  montelukast (SINGULAIR) 10 MG tablet, Take 10 mg by mouth at bedtime. , Disp: , Rfl:  .  nystatin (MYCOSTATIN) 100000 UNIT/ML suspension, Take 5 mLs by mouth as needed. , Disp: , Rfl:  .  omeprazole (PRILOSEC) 20 MG capsule, Take 20 mg by mouth daily., Disp: , Rfl:  .  Potassium Gluconate 550 (90 K) MG TABS, Take by mouth daily., Disp: , Rfl:  .  Xylitol (XYLIMELTS MT), Use as directed in the mouth or throat at bedtime., Disp: , Rfl:  .  HYDROcodone-acetaminophen (NORCO) 5-325 MG tablet, Take 1 tablet by mouth every 4 (four) hours as needed for moderate pain. (Patient not taking: Reported on 10/13/2020), Disp: 20 tablet, Rfl: 0 .  KRILL OIL PO, Take by mouth at bedtime. , Disp: , Rfl:  .  oxybutynin (DITROPAN) 5 MG tablet, Take 1 tablet (5 mg total) by mouth every 8 (eight) hours as needed for bladder spasms. (Patient not taking: Reported on 10/13/2020), Disp: 30 tablet, Rfl: 1 .  phenazopyridine (PYRIDIUM) 200  MG tablet, Take 1 tablet (200 mg total) by mouth 3 (three) times daily as needed (for pain with urination). (Patient not taking: Reported on 10/13/2020), Disp: 30 tablet, Rfl: 0 .  S-Adenosylmethionine (SAM-E) 400 MG TABS, Take 1 tablet by mouth at bedtime., Disp: , Rfl:  .  tamsulosin (FLOMAX) 0.4 MG CAPS capsule, Take 0.4 mg by mouth every evening.  (Patient not taking: Reported on 10/13/2020), Disp: , Rfl:   Imaging Review   MRI LUMBAR SPINE WITHOUT CONTRAST, 07/01/2019 9:14 AM   INDICATION: s1 radiculopathy \ s1 radiculopathy on NCS \ M54.17 Lumbosacral radiculopathy   COMPARISON: None.   TECHNIQUE: Multiplanar, multisequence surface-coil magnetic resonance imaging of the lumbar spine was performed without contrast.   LEVELS IMAGED: Lower thoracic to the upper sacral region.   FINDINGS:    Alignment: Degenerative grade 1 retrolisthesis of L5 on S1.   Vertebrae:Vertebral body heights are maintained. Discogenic endplate changes surrounding L4-L5 and L5-S1. No marrow signal abnormalities to suggest neoplasm.   Conus medullaris: In normal position terminating at the level of L1-L2. Normal signal and contour.   Degenerative changes:   T12-L1:  No substantial canal or foraminal stenosis.  L1-L2: Mild disc desiccation and height loss resulting in broad-based disc bulge. Focal T2 hyperintense zone along the posterior margin compatible with an annular fissure. Disc bulge narrows the ventral thecal sac. No substantial canal or foraminal stenosis.  L2-L3: Mild disc desiccation and height loss. Broad-based disc bulge mildly narrows the ventral thecal sac. No substantial canal or foraminal stenosis. Bilateral perineural root sleeve cysts.  L3-L4: Mild disc desiccation and height loss. Bilateral facet hypertrophy and ligamentum flavum thickening and broad-based disc bulge contribute to mild bilateral subarticular zone and left foraminal stenosis. The broad-based disc bulge likely  contacts bilateral descending L4 nerve roots without displacement. No significant canal stenosis.  L4-L5: Disc desiccation with advanced disc height loss and associated endplate degenerative marrow signal changes. Broad-based disc bulge, facet hypertrophy, and ligamentum flavum thickening contributes to moderate to advanced right subarticular zone and foraminal stenosis. This finding results in crowding and possibly impingement of the right exiting L4 and descending L5 nerve roots. Mild left foraminal and canal stenosis.  L5-S1: Grade 1 degenerative retrolisthesis. Disc desiccation with advanced disc height loss and endplate degenerative marrow signal changes. Broad-based disc bulge, facet hypertrophy, and ligamentum flavum thickening contributes to mild bilateral subarticular zone stenosis as well as moderate right and advanced left foraminal stenosis. The broad-based disc bulge contacts the right descending S1 nerve root, without displacement. No significant canal stenosis. Small bilateral facet joint effusions. Trace fluid posteriorly along the left facet may reflect tiny developing synovial cyst.   Upper Sacrum: No focal lesion identified.     CONCLUSION:   1. Multilevel degenerative changes, most pronounced at L4-L5 and L5-S1.   At L4-L5, there is moderate to advanced right subarticular and right foraminal stenosis. There is associated crowding of nerve roots in the right subarticular recess with possible impingement.   At L5-S1 there is advanced left and moderate right foraminal stenosis.   Small broad-based disc bulges at L3-L4 and L5-S1 contact the descending nerve roots, as described above. No significant canal stenosis.     DG Ankle Complete Left  Narrative CLINICAL DATA:  Pain soft tissue swelling at medial ankle, marked, increased swelling for 5 weeks, no trauma  EXAM: LEFT ANKLE COMPLETE - 3+ VIEW  COMPARISON:  None  FINDINGS: BB placed at site of symptoms at medial  LEFT ankle.  Osseous mineralization normal.  Ankle mortise intact.  Linear calcifications seen inferior to the medial malleolus may represent ligamentous calcification as result of prior injury or remote avulsion fragment.  Soft tissue swelling present medially and laterally.  No acute fracture, dislocation, or bone destruction.  Small plantar calcaneal spur.  IMPRESSION: Soft tissue swelling at ankle.  Calcification inferior to the median malleolus may represent ligamentous calcification as result of prior ligamentous injury or remote avulsion fragment.  No acute osseous abnormalities.   Electronically Signed By: Lavonia Dana M.D. On: 11/10/2014 16:58   DG Wrist Complete Right  Narrative *RADIOLOGY REPORT*  Clinical Data: Lateral wrist pain  RIGHT WRIST - COMPLETE 3+ VIEW  Comparison: None.  Findings: Four views of the right wrist submitted.  No acute fracture or subluxation.  Small bony fragment with just lateral to the scaphoid is well corticated probable due to prior injury.  IMPRESSION: No acute fracture or subluxation.     Complexity Note: Imaging results reviewed. Results shared with Mr. Brobeck, using Layman's terms.  ROS  Cardiovascular: No reported cardiovascular signs or symptoms such as High blood pressure, coronary artery disease, abnormal heart rate or rhythm, heart attack, blood thinner therapy or heart weakness and/or failure Pulmonary or Respiratory: Snoring , Coughing up mucus (Bronchitis), Exposure to tuberculosis and Temporary stoppage of breathing during sleep Neurological: Abnormal skin sensations (Peripheral Neuropathy) Psychological-Psychiatric: Difficulty sleeping and or falling asleep Gastrointestinal: Heartburn due to stomach pushing into lungs (Hiatal hernia) and Reflux or heatburn Genitourinary: No reported renal or genitourinary signs or symptoms such as difficulty voiding or producing urine, peeing blood,  non-functioning kidney, kidney stones, difficulty emptying the bladder, difficulty controlling the flow of urine, or chronic kidney disease Hematological: No reported hematological signs or symptoms such as prolonged bleeding, low or poor functioning platelets, bruising or bleeding easily, hereditary bleeding problems, low energy levels due to low hemoglobin or being anemic Endocrine: No reported endocrine signs or symptoms such as high or low blood sugar, rapid heart rate due to high thyroid levels, obesity or weight gain due to slow thyroid or thyroid disease Rheumatologic: Joint aches and or swelling due to excess weight (Osteoarthritis) Musculoskeletal: Negative for myasthenia gravis, muscular dystrophy, multiple sclerosis or malignant hyperthermia Work History: Working part time  Allergies  Mr. Belisle is allergic to other, cialis [tadalafil], sildenafil, and soy protein [place] oil].  Laboratory Chemistry Profile   Renal Lab Results  Component Value Date   BUN 13 05/06/2020   CREATININE 0.64 05/06/2020   BCR 19 10/08/2019   GFRAA >60 05/06/2020   GFRNONAA >60 05/06/2020     Electrolytes Lab Results  Component Value Date   NA 141 05/06/2020   K 4.1 05/06/2020   CL 108 05/06/2020   CALCIUM 8.9 05/06/2020     Hepatic Lab Results  Component Value Date   AST 24 12/25/2019   ALT 31 12/25/2019   ALBUMIN 4.2 12/25/2019   ALKPHOS 55 12/25/2019     ID Lab Results  Component Value Date   [place] NEGATIVE 05/01/2020     Bone No results found for: [place], [place], [id number], [id number], 25OHVITD1, 25OHVITD2, 25OHVITD3, TESTOFREE, TESTOSTERONE   Endocrine Lab Results  Component Value Date   GLUCOSE 124 (H) 05/06/2020     Neuropathy No results found for: VITAMINB12, FOLATE, HGBA1C, HIV   CNS No results found for: COLORCSF, APPEARCSF, RBCCOUNTCSF, WBCCSF, POLYSCSF, LYMPHSCSF, EOSCSF, PROTEINCSF, GLUCCSF, JCVIRUS, CSFOLI, IGGCSF, LABACHR, ACETBL, LABACHR, ACETBL    Inflammation (CRP: Acute  ESR: Chronic) No results found for: CRP, ESRSEDRATE, LATICACIDVEN   Rheumatology No results found for: RF, ANA, LABURIC, URICUR, LYMEIGGIGMAB, LYMEABIGMQN, HLAB27   Coagulation No results found for: INR, LABPROT, APTT, PLT, DDIMER, LABHEMA, VITAMINK1, AT3   Cardiovascular No results found for: BNP, CKTOTAL, CKMB, TROPONINI, HGB, HCT, LABVMA, EPIRU, EPINEPH24HUR, NOREPRU, NOREPI24HUR, DOPARU, [id number]   Screening Lab Results  Component Value Date   SARSCOV2NAA NEGATIVE 05/01/2020     Cancer No results found for: CEA, CA125, LABCA2   Allergens No results found for: ALMOND, APPLE, ASPARAGUS, AVOCADO, BANANA, BARLEY, BASIL, BAYLEAF, GREENBEAN, LIMABEAN, WHITEBEAN, BEEFIGE, REDBEET, BLUEBERRY, BROCCOLI, CABBAGE, MELON, CARROT, CASEIN, CASHEWNUT, CAULIFLOWER, CELERY     Note: Lab results reviewed.  [place]  Drug: Mr. Erhart  reports no history of drug use. Alcohol:  reports no history of alcohol use. Tobacco:  reports that he has never smoked. He has never used smokeless tobacco. Medical:  has a past medical history of AAA (abdominal aortic aneurysm) ([place]), ALLERGIC RHINITIS, Arthritis pain of wrist, Bell's palsy, Benign localized prostatic hyperplasia with  lower urinary tract symptoms (LUTS), Chronic bilateral low back pain with bilateral sciatica, Diverticulosis, Patrick Moyer (erectile dysfunction), Environmental and seasonal allergies, Family history of Marfan syndrome, GERD (gastroesophageal reflux disease), Hiatal hernia, High cholesterol, History of closed head injury, History of positive PPD (age 69), HSV-1 infection, Marfanoid habitus, Mild CAD, Non-celiac gluten sensitivity, Numbness and tingling in both hands, OSA on CPAP, Pigment dispersion syndrome, Polyneuropathy, Pulmonary nodules, Radiculopathy of leg, and Wears glasses. Family: family history includes Aortic aneurysm in his father; Bipolar disorder in his brother; CAD in his father; Glaucoma in his brother;  Hypertension in his father; Marfan syndrome in his brother; Obesity in his brother; Osteoarthritis in his sister; Ovarian cancer in his mother.  Past Surgical History:  Procedure Laterality Date  . FINGER EXPLORATION Left 1995   left middle finger digital nerve repair  . INGUINAL HERNIA REPAIR Left 1978  . LAPAROSCOPIC APPENDECTOMY  2010  . NASAL SEPTUM SURGERY  1975  . TRANSURETHRAL RESECTION OF PROSTATE N/A 05/05/2020   Procedure: TRANSURETHRAL RESECTION OF THE PROSTATE (TURP), BIPOLAR;  Surgeon: Ceasar Mons, MD;  Location: [facility];  Service: Urology;  Laterality: N/A;  . WISDOM TOOTH EXTRACTION  1975   Active Ambulatory Problems    Diagnosis Date Noted  . Allergic rhinitis due to pollen 11/22/2011  . Allergic rhinitis due to food 11/22/2011  . Obstructive sleep apnea 11/22/2011  . Positive TB test 11/22/2011  . Abnormal finding on chest xray 11/22/2011  . Malabsorption 05/01/2012  . Pain in joint, hand 02/19/2013  . Muscle weakness (generalized) 02/19/2013  . BPH with urinary obstruction 05/05/2020  . Lumbar spondylosis 10/13/2020  . Lumbar radiculopathy 10/13/2020  . Lumbar degenerative disc disease 10/13/2020  . Sacroiliac joint pain 10/13/2020  . SI joint arthritis 10/13/2020  . Piriformis syndrome of left side 10/13/2020  . Chronic pain syndrome 10/13/2020  . Numbness and tingling in left hand 10/13/2020  . Cervical radicular pain 10/13/2020   Resolved Ambulatory Problems    Diagnosis Date Noted  . No Resolved Ambulatory Problems   Past Medical History:  Diagnosis Date  . AAA (abdominal aortic aneurysm) ([place])   . ALLERGIC RHINITIS   . Arthritis pain of wrist   . Bell's palsy   . Benign localized prostatic hyperplasia with lower urinary tract symptoms (LUTS)   . Chronic bilateral low back pain with bilateral sciatica   . Diverticulosis   . Patrick Moyer (erectile dysfunction)   . Environmental and seasonal allergies   . Family history of  Marfan syndrome   . GERD (gastroesophageal reflux disease)   . Hiatal hernia   . High cholesterol   . History of closed head injury   . 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 42  . HSV-1 infection   . Marfanoid habitus   . Mild CAD   . Non-celiac gluten sensitivity   . Numbness and tingling in both hands   . OSA on CPAP   . Pigment dispersion syndrome   . Polyneuropathy   . Pulmonary nodules   . Radiculopathy of leg   . Wears glasses    Constitutional Exam  General appearance: Well nourished, well developed, and well hydrated. In no apparent acute distress Vitals:   10/13/20 1423  BP: (!) 138/58  Pulse: 65  Resp: 16  Temp: 98.2 F (36.8 C)  TempSrc: Temporal  SpO2: 98%  Weight: 190 lb (86.2 kg)  Height: 6' 3"  (1.905 m)   BMI Assessment: Estimated body mass index is 23.75 kg/m as calculated  from the following:   Height as of this encounter: 6' 3"  (1.905 m).   Weight as of this encounter: 190 lb (86.2 kg).  BMI interpretation table: BMI level Category Range association with higher incidence of chronic pain  <18 kg/m2 Underweight   18.5-24.9 kg/m2 Ideal body weight   25-29.9 kg/m2 Overweight Increased incidence by 20%  30-34.9 kg/m2 Obese (Class I) Increased incidence by 68%  35-39.9 kg/m2 Severe obesity (Class II) Increased incidence by 136%  >40 kg/m2 Extreme obesity (Class III) Increased incidence by 254%   Patient's current BMI Ideal Body weight  Body mass index is 23.75 kg/m. Ideal body weight: 84.5 kg (186 lb 4.6 oz) Adjusted ideal body weight: 85.2 kg (187 lb 12.4 oz)   BMI Readings from Last 4 Encounters:  10/13/20 23.75 kg/m  05/05/20 23.84 kg/m  09/05/19 25.51 kg/m  10/16/16 23.26 kg/m   Wt Readings from Last 4 Encounters:  10/13/20 190 lb (86.2 kg)  05/05/20 193 lb 4.8 oz (87.7 kg)  09/05/19 207 lb 6.4 oz (94.1 kg)  10/16/16 191 lb (86.6 kg)    Psych/Mental status: Alert, oriented x 3 (person, place, & time)       Eyes: PERLA Respiratory: No evidence  of acute respiratory distress  Cervical Spine Exam  Skin & Axial Inspection: No masses, redness, edema, swelling, or associated skin lesions Alignment: Symmetrical Functional ROM: Unrestricted ROM      Stability: No instability detected Muscle Tone/Strength: Functionally intact. No obvious neuro-muscular anomalies detected. Sensory (Neurological): Dermatomal pain pattern Palpation: No palpable anomalies              Upper Extremity (UE) Exam    Side: Right upper extremity  Side: Left upper extremity  Skin & Extremity Inspection: Skin color, temperature, and hair growth are WNL. No peripheral edema or cyanosis. No masses, redness, swelling, asymmetry, or associated skin lesions. No contractures.  Skin & Extremity Inspection: Skin color, temperature, and hair growth are WNL. No peripheral edema or cyanosis. No masses, redness, swelling, asymmetry, or associated skin lesions. No contractures.  Functional ROM: Unrestricted ROM          Functional ROM: Pain restricted ROM for shoulder and elbow  Muscle Tone/Strength: Functionally intact. No obvious neuro-muscular anomalies detected.   Muscle Tone/Strength: Functionally intact. No obvious neuro-muscular anomalies detected.  Sensory (Neurological): Unimpaired          Sensory (Neurological): Dermatomal pain pattern affecting the shoulder and elbow and left thumb  Palpation: No palpable anomalies              Palpation: No palpable anomalies              Provocative Test(s):  Phalen's test: deferred Tinel's test: deferred Apley's scratch test (touch opposite shoulder):  Action 1 (Across chest): deferred Action 2 (Overhead): deferred Action 3 (LB reach): deferred   Provocative Test(s):  Phalen's test: deferred Tinel's test: deferred Apley's scratch test (touch opposite shoulder):  Action 1 (Across chest): deferred Action 2 (Overhead): deferred Action 3 (LB reach): deferred    Thoracic Spine Area Exam  Skin & Axial Inspection: No masses,  redness, or swelling Alignment: Symmetrical Functional ROM: Unrestricted ROM Stability: No instability detected Muscle Tone/Strength: Functionally intact. No obvious neuro-muscular anomalies detected. Sensory (Neurological): Unimpaired Muscle strength & Tone: No palpable anomalies  Lumbar Exam  Skin & Axial Inspection: No masses, redness, or swelling Alignment: Symmetrical Functional ROM: Pain restricted ROM       Stability: No instability detected Muscle Tone/Strength: Functionally  intact. No obvious neuro-muscular anomalies detected. Sensory (Neurological): Dermatomal pain pattern Left L4/5 Palpation: No palpable anomalies       Provocative Tests:  Lumbar quadrant test (Kemp's test): (+) on the left for foraminal stenosis Lateral bending test: (+) ipsilateral radicular pain, on the left. Positive for left-sided foraminal stenosis. Patrick's Maneuver: (+) for left-sided S-I arthralgia             FABER* test: deferred today                   S-I anterior distraction/compression test: deferred today         S-I lateral compression test: deferred today         S-I Thigh-thrust test: deferred today         S-I Gaenslen's test: deferred today         *(Flexion, ABduction and External Rotation)  Gait & Posture Assessment  Ambulation: Unassisted Gait: Relatively normal for age and body habitus Posture: WNL   Lower Extremity Exam    Side: Right lower extremity  Side: Left lower extremity  Stability: No instability observed          Stability: No instability observed          Skin & Extremity Inspection: Skin color, temperature, and hair growth are WNL. No peripheral edema or cyanosis. No masses, redness, swelling, asymmetry, or associated skin lesions. No contractures.  Skin & Extremity Inspection: Skin color, temperature, and hair growth are WNL. No peripheral edema or cyanosis. No masses, redness, swelling, asymmetry, or associated skin lesions. No contractures.  Functional ROM:  Unrestricted ROM                  Functional ROM: Unrestricted ROM                  Muscle Tone/Strength: Functionally intact. No obvious neuro-muscular anomalies detected.  Muscle Tone/Strength: Functionally intact. No obvious neuro-muscular anomalies detected.  Sensory (Neurological): Unimpaired        Sensory (Neurological): Unimpaired        DTR: Patellar: deferred today Achilles: deferred today Plantar: deferred today  DTR: Patellar: deferred today Achilles: deferred today Plantar: deferred today  Palpation: No palpable anomalies  Palpation: No palpable anomalies   Assessment  Primary Diagnosis & Pertinent Problem List: The primary encounter diagnosis was Chronic radicular lumbar pain. Diagnoses of Lumbar radiculopathy, Lumbar facet arthropathy, Lumbar spondylosis, Lumbar degenerative disc disease, Sacroiliac joint pain, SI joint arthritis, Piriformis syndrome of left side, Numbness and tingling in left hand, Cervical radicular pain, and Chronic pain syndrome were also pertinent to this visit.  Visit Diagnosis (New problems to examiner): 1. Chronic radicular lumbar pain   2. Lumbar radiculopathy   3. Lumbar facet arthropathy   4. Lumbar spondylosis   5. Lumbar degenerative disc disease   6. Sacroiliac joint pain   7. SI joint arthritis   8. Piriformis syndrome of left side   9. Numbness and tingling in left hand   10. Cervical radicular pain   11. Chronic pain syndrome    General Recommendations: The pain condition that the patient suffers from is best treated with a multidisciplinary approach that involves an increase in physical activity to prevent de-conditioning and worsening of the pain cycle, as well as psychological counseling (formal and/or informal) to address the co-morbid psychological affects of pain. Treatment will often involve judicious use of pain medications and interventional procedures to decrease the pain, allowing the patient to participate in the  physical  activity that will ultimately produce long-lasting pain reductions. The goal of the multidisciplinary approach is to return the patient to a higher level of overall function and to restore their ability to perform activities of daily living.  Plan of Care (Initial workup plan)  Note: Mr. Kean was reminded that as per protocol, today's visit has been an evaluation only. We have not taken over the patient's controlled substance management.  1.  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  Recommend intralaminar epidural steroid injection at L4-L5. 2.  Left SI joint dysfunction, left piriformis syndrome: Continue with piriformis release stretches.  Future considerations could include left sacroiliac joint injection, left piriformis muscle injection 3.  Neck pain with radiation to left thumb concerning for cervical radiculopathy.  Recommend referral to physical therapy for the symptoms and MRI of cervical spine to further evaluate.   Imaging Orders     MR CERVICAL SPINE WO CONTRAST  Referral Orders     Ambulatory referral to Physical Therapy  Procedure Orders     Lumbar Epidural Injection  Interventional management options: Mr. Willemsen was informed that there is no guarantee that he would be a candidate for interventional therapies. The decision will be based on the results of diagnostic studies, as well as Mr. Prieto risk profile.  Procedure(s) under consideration:  Interlaminar lumbar epidural steroid injection Left sacroiliac joint injection Left piriformis injection Bilateral lumbar facet medial branch nerve blocks Spinal cord stimulation Sprint peripheral nerve stimulation Cervical epidural steroid injection   Provider-requested follow-up: Return in about 2 weeks (around 10/27/2020), or L-ESI, for without sedation.  Future Appointments  Date Time  Provider [place]  12/27/2020 10:20 AM Dorothy Spark, MD [facility]    Note by: Gillis Santa, MD Date: 10/13/2020; Time: 4:11 PM

## 2020-10-13 NOTE — Patient Instructions (Addendum)
You have been referred for cervical spine MRI and PT.  GENERAL RISKS AND COMPLICATIONS  What are the risk, side effects and possible complications? Generally speaking, most procedures are safe.  However, with any procedure there are risks, side effects, and the possibility of complications.  The risks and complications are dependent upon the sites that are lesioned, or the type of nerve block to be performed.  The closer the procedure is to the spine, the more serious the risks are.  Great care is taken when placing the radio frequency needles, block needles or lesioning probes, but sometimes complications can occur. 1. Infection: Any time there is an injection through the skin, there is a risk of infection.  This is why sterile conditions are used for these blocks.  There are four possible types of infection. 1. Localized skin infection. 2. Central Nervous System Infection-This can be in the form of Meningitis, which can be deadly. 3. Epidural Infections-This can be in the form of an epidural abscess, which can cause pressure inside of the spine, causing compression of the spinal cord with subsequent paralysis. This would require an emergency surgery to decompress, and there are no guarantees that the patient would recover from the paralysis. 4. Discitis-This is an infection of the intervertebral discs.  It occurs in about 1% of discography procedures.  It is difficult to treat and it may lead to surgery.        2. Pain: the needles have to go through skin and soft tissues, will cause soreness.       3. Damage to internal structures:  The nerves to be lesioned may be near blood vessels or    other nerves which can be potentially damaged.       4. Bleeding: Bleeding is more common if the patient is taking blood thinners such as  aspirin, Coumadin, Ticiid, Plavix, etc., or if he/she have some genetic predisposition  such as hemophilia. Bleeding into the spinal canal can cause compression of the spinal   cord with subsequent paralysis.  This would require an emergency surgery to  decompress and there are no guarantees that the patient would recover from the  paralysis.       5. Pneumothorax:  Puncturing of a lung is a possibility, every time a needle is introduced in  the area of the chest or upper back.  Pneumothorax refers to free air around the  collapsed lung(s), inside of the thoracic cavity (chest cavity).  Another two possible  complications related to a similar event would include: Hemothorax and Chylothorax.   These are variations of the Pneumothorax, where instead of air around the collapsed  lung(s), you may have blood or chyle, respectively.       6. Spinal headaches: They may occur with any procedures in the area of the spine.       7. Persistent CSF (Cerebro-Spinal Fluid) leakage: This is a rare problem, but may occur  with prolonged intrathecal or epidural catheters either due to the formation of a fistulous  track or a dural tear.       8. Nerve damage: By working so close to the spinal cord, there is always a possibility of  nerve damage, which could be as serious as a permanent spinal cord injury with  paralysis.       9. Death:  Although rare, severe deadly allergic reactions known as "Anaphylactic  reaction" can occur to any of the medications used.      10.  Worsening of the symptoms:  We can always make thing worse.  What are the chances of something like this happening? Chances of any of this occuring are extremely low.  By statistics, you have more of a chance of getting killed in a motor vehicle accident: while driving to the hospital than any of the above occurring .  Nevertheless, you should be aware that they are possibilities.  In general, it is similar to taking a shower.  Everybody knows that you can slip, hit your head and get killed.  Does that mean that you should not shower again?  Nevertheless always keep in mind that statistics do not mean anything if you happen to be on  the wrong side of them.  Even if a procedure has a 1 (one) in a 1,000,000 (million) chance of going wrong, it you happen to be that one..Also, keep in mind that by statistics, you have more of a chance of having something go wrong when taking medications.  Who should not have this procedure? If you are on a blood thinning medication (e.g. Coumadin, Plavix, see list of "Blood Thinners"), or if you have an active infection going on, you should not have the procedure.  If you are taking any blood thinners, please inform your physician.  How should I prepare for this procedure?  Do not eat or drink anything at least six hours prior to the procedure.  Bring a driver with you .  It cannot be a taxi.  Come accompanied by an adult that can drive you back, and that is strong enough to help you if your legs get weak or numb from the local anesthetic.  Take all of your medicines the morning of the procedure with just enough water to swallow them.  If you have diabetes, make sure that you are scheduled to have your procedure done first thing in the morning, whenever possible.  If you have diabetes, take only half of your insulin dose and notify our nurse that you have done so as soon as you arrive at the clinic.  If you are diabetic, but only take blood sugar pills (oral hypoglycemic), then do not take them on the morning of your procedure.  You may take them after you have had the procedure.  Do not take aspirin or any aspirin-containing medications, at least eleven (11) days prior to the procedure.  They may prolong bleeding.  Wear loose fitting clothing that may be easy to take off and that you would not mind if it got stained with Betadine or blood.  Do not wear any jewelry or perfume  Remove any nail coloring.  It will interfere with some of our monitoring equipment.  NOTE: Remember that this is not meant to be interpreted as a complete list of all possible complications.  Unforeseen problems  may occur.  BLOOD THINNERS The following drugs contain aspirin or other products, which can cause increased bleeding during surgery and should not be taken for 2 weeks prior to and 1 week after surgery.  If you should need take something for relief of minor pain, you may take acetaminophen which is found in Tylenol,m Datril, Anacin-3 and Panadol. It is not blood thinner. The products listed below are.  Do not take any of the products listed below in addition to any listed on your instruction sheet.  A.P.C or A.P.C with Codeine Codeine Phosphate Capsules #3 Ibuprofen Ridaura  ABC compound Congesprin Imuran rimadil  Advil Cope Indocin Robaxisal  Alka-Seltzer  Effervescent Pain Reliever and Antacid Coricidin or Coricidin-D  Indomethacin Rufen  Alka-Seltzer plus Cold Medicine Cosprin Ketoprofen S-A-C Tablets  Anacin Analgesic Tablets or Capsules Coumadin Korlgesic Salflex  Anacin Extra Strength Analgesic tablets or capsules CP-2 Tablets Lanoril Salicylate  Anaprox Cuprimine Capsules Levenox Salocol  Anexsia-D Dalteparin Magan Salsalate  Anodynos Darvon compound Magnesium Salicylate Sine-off  Ansaid Dasin Capsules Magsal Sodium Salicylate  Anturane Depen Capsules Marnal Soma  APF Arthritis pain formula Dewitt's Pills Measurin Stanback  Argesic Dia-Gesic Meclofenamic Sulfinpyrazone  Arthritis Bayer Timed Release Aspirin Diclofenac Meclomen Sulindac  Arthritis pain formula Anacin Dicumarol Medipren Supac  Analgesic (Safety coated) Arthralgen Diffunasal Mefanamic Suprofen  Arthritis Strength Bufferin Dihydrocodeine Mepro Compound Suprol  Arthropan liquid Dopirydamole Methcarbomol with Aspirin Synalgos  ASA tablets/Enseals Disalcid Micrainin Tagament  Ascriptin Doan's Midol Talwin  Ascriptin A/D Dolene Mobidin Tanderil  Ascriptin Extra Strength Dolobid Moblgesic Ticlid  Ascriptin with Codeine Doloprin or Doloprin with Codeine Momentum Tolectin  Asperbuf Duoprin Mono-gesic Trendar  Aspergum  Duradyne Motrin or Motrin IB Triminicin  Aspirin plain, buffered or enteric coated Durasal Myochrisine Trigesic  Aspirin Suppositories Easprin Nalfon Trillsate  Aspirin with Codeine Ecotrin Regular or Extra Strength Naprosyn Uracel  Atromid-S Efficin Naproxen Ursinus  Auranofin Capsules Elmiron Neocylate Vanquish  Axotal Emagrin Norgesic Verin  Azathioprine Empirin or Empirin with Codeine Normiflo Vitamin E  Azolid Emprazil Nuprin Voltaren  Bayer Aspirin plain, buffered or children's or timed BC Tablets or powders Encaprin Orgaran Warfarin Sodium  Buff-a-Comp Enoxaparin Orudis Zorpin  Buff-a-Comp with Codeine Equegesic Os-Cal-Gesic   Buffaprin Excedrin plain, buffered or Extra Strength Oxalid   Bufferin Arthritis Strength Feldene Oxphenbutazone   Bufferin plain or Extra Strength Feldene Capsules Oxycodone with Aspirin   Bufferin with Codeine Fenoprofen Fenoprofen Pabalate or Pabalate-SF   Buffets II Flogesic Panagesic   Buffinol plain or Extra Strength Florinal or Florinal with Codeine Panwarfarin   Buf-Tabs Flurbiprofen Penicillamine   Butalbital Compound Four-way cold tablets Penicillin   Butazolidin Fragmin Pepto-Bismol   Carbenicillin Geminisyn Percodan   Carna Arthritis Reliever Geopen Persantine   Carprofen Gold's salt Persistin   Chloramphenicol Goody's Phenylbutazone   Chloromycetin Haltrain Piroxlcam   Clmetidine heparin Plaquenil   Cllnoril Hyco-pap Ponstel   Clofibrate Hydroxy chloroquine Propoxyphen         Before stopping any of these medications, be sure to consult the physician who ordered them.  Some, such as Coumadin (Warfarin) are ordered to prevent or treat serious conditions such as "deep thrombosis", "pumonary embolisms", and other heart problems.  The amount of time that you may need off of the medication may also vary with the medication and the reason for which you were taking it.  If you are taking any of these medications, please make sure you notify your pain  physician before you undergo any procedures.         Epidural Steroid Injection Patient Information  Description: The epidural space surrounds the nerves as they exit the spinal cord.  In some patients, the nerves can be compressed and inflamed by a bulging disc or a tight spinal canal (spinal stenosis).  By injecting steroids into the epidural space, we can bring irritated nerves into direct contact with a potentially helpful medication.  These steroids act directly on the irritated nerves and can reduce swelling and inflammation which often leads to decreased pain.  Epidural steroids may be injected anywhere along the spine and from the neck to the low back depending upon the location of your pain.   After numbing  the skin with local anesthetic (like Novocaine), a small needle is passed into the epidural space slowly.  You may experience a sensation of pressure while this is being done.  The entire block usually last less than 10 minutes.  Conditions which may be treated by epidural steroids:   Low back and leg pain  Neck and arm pain  Spinal stenosis  Post-laminectomy syndrome  Herpes zoster (shingles) pain  Pain from compression fractures  Preparation for the injection:  1. Do not eat any solid food or dairy products within 8 hours of your appointment.  2. You may drink clear liquids up to 3 hours before appointment.  Clear liquids include water, black coffee, juice or soda.  No milk or cream please. 3. You may take your regular medication, including pain medications, with a sip of water before your appointment  Diabetics should hold regular insulin (if taken separately) and take 1/2 normal NPH dos the morning of the procedure.  Carry some sugar containing items with you to your appointment. 4. A driver must accompany you and be prepared to drive you home after your procedure.  5. Bring all your current medications with your. 6. An IV may be inserted and sedation may be given  at the discretion of the physician.   7. A blood pressure cuff, EKG and other monitors will often be applied during the procedure.  Some patients may need to have extra oxygen administered for a short period. 8. You will be asked to provide medical information, including your allergies, prior to the procedure.  We must know immediately if you are taking blood thinners (like Coumadin/Warfarin)  Or if you are allergic to IV iodine contrast (dye). We must know if you could possible be pregnant.  Possible side-effects:  Bleeding from needle site  Infection (rare, may require surgery)  Nerve injury (rare)  Numbness & tingling (temporary)  Difficulty urinating (rare, temporary)  Spinal headache ( a headache worse with upright posture)  Light -headedness (temporary)  Pain at injection site (several days)  Decreased blood pressure (temporary)  Weakness in arm/leg (temporary)  Pressure sensation in back/neck (temporary)  Call if you experience:  Fever/chills associated with headache or increased back/neck pain.  Headache worsened by an upright position.  New onset weakness or numbness of an extremity below the injection site  Hives or difficulty breathing (go to the emergency room)  Inflammation or drainage at the infection site  Severe back/neck pain  Any new symptoms which are concerning to you  Please note:  Although the local anesthetic injected can often make your back or neck feel good for several hours after the injection, the pain will likely return.  It takes 3-7 days for steroids to work in the epidural space.  You may not notice any pain relief for at least that one week.  If effective, we will often do a series of three injections spaced 3-6 weeks apart to maximally decrease your pain.  After the initial series, we generally will wait several months before considering a repeat injection of the same type.  If you have any questions, please call [phone] [facility]

## 2020-10-13 NOTE — Progress Notes (Signed)
Safety precautions to be maintained throughout the outpatient stay will include: orient to surroundings, keep bed in low position, maintain call bell within reach at all times, provide assistance with transfer out of bed and ambulation.  

## 2020-10-14 ENCOUNTER — Telehealth: Payer: Self-pay | Admitting: *Deleted

## 2020-11-03 ENCOUNTER — Other Ambulatory Visit: Payer: Self-pay

## 2020-11-03 ENCOUNTER — Ambulatory Visit
Admission: RE | Admit: 2020-11-03 | Discharge: 2020-11-03 | Disposition: A | Discharge status: Home / Self Care | Payer: Medicare PPO | Source: Ambulatory Visit | Attending: Student in an Organized Health Care Education/Training Program | Admitting: Student in an Organized Health Care Education/Training Program

## 2020-11-03 DIAGNOSIS — M5412 Radiculopathy, cervical region: Secondary | ICD-10-CM | POA: Diagnosis present

## 2020-11-03 DIAGNOSIS — G894 Chronic pain syndrome: Secondary | ICD-10-CM

## 2020-11-03 DIAGNOSIS — R200 Anesthesia of skin: Secondary | ICD-10-CM

## 2020-11-03 DIAGNOSIS — R202 Paresthesia of skin: Secondary | ICD-10-CM | POA: Diagnosis present

## 2020-11-23 ENCOUNTER — Telehealth: Payer: Self-pay | Admitting: Student in an Organized Health Care Education/Training Program

## 2020-11-23 NOTE — Telephone Encounter (Signed)
Patient had both CT's completed and wanted to make sure you saw them before his procedure on Wed. They are in his chart

## 2020-11-24 ENCOUNTER — Ambulatory Visit (HOSPITAL_BASED_OUTPATIENT_CLINIC_OR_DEPARTMENT_OTHER): Payer: Medicare PPO | Admitting: Student in an Organized Health Care Education/Training Program

## 2020-11-24 ENCOUNTER — Other Ambulatory Visit: Payer: Self-pay

## 2020-11-24 ENCOUNTER — Ambulatory Visit
Admission: RE | Admit: 2020-11-24 | Discharge: 2020-11-24 | Disposition: A | Discharge status: Home / Self Care | Payer: Medicare PPO | Source: Ambulatory Visit | Attending: Student in an Organized Health Care Education/Training Program | Admitting: Student in an Organized Health Care Education/Training Program

## 2020-11-24 ENCOUNTER — Encounter: Payer: Self-pay | Admitting: Student in an Organized Health Care Education/Training Program

## 2020-11-24 DIAGNOSIS — M5416 Radiculopathy, lumbar region: Secondary | ICD-10-CM | POA: Diagnosis present

## 2020-11-24 DIAGNOSIS — G894 Chronic pain syndrome: Secondary | ICD-10-CM | POA: Diagnosis present

## 2020-11-24 DIAGNOSIS — G8929 Other chronic pain: Secondary | ICD-10-CM | POA: Diagnosis present

## 2020-11-24 MED ORDER — DEXAMETHASONE SODIUM PHOSPHATE 10 MG/ML IJ SOLN
10.0000 mg | Freq: Once | INTRAMUSCULAR | Status: AC
  Administered 2020-11-24: 10 mg
  Filled 2020-11-24: qty 1

## 2020-11-24 MED ORDER — SODIUM CHLORIDE 0.9% FLUSH
3.0000 mL | Freq: Once | INTRAVENOUS | Status: AC
  Administered 2020-11-24: 3 mL

## 2020-11-24 MED ORDER — LIDOCAINE HCL 2 % IJ SOLN
20.0000 mL | Freq: Once | INTRAMUSCULAR | Status: AC
  Administered 2020-11-24: 400 mg
  Filled 2020-11-24: qty 20

## 2020-11-24 MED ORDER — IOHEXOL 180 MG/ML  SOLN
10.0000 mL | Freq: Once | INTRAMUSCULAR | Status: AC
  Administered 2020-11-24: 10 mL via EPIDURAL

## 2020-11-24 MED ORDER — GABAPENTIN 300 MG PO CAPS: ORAL_CAPSULE | ORAL | 3 refills | Status: DC

## 2020-11-24 MED ORDER — ROPIVACAINE HCL 2 MG/ML IJ SOLN
2.0000 mL | Freq: Once | INTRAMUSCULAR | Status: AC
  Administered 2020-11-24: 2 mL via EPIDURAL
  Filled 2020-11-24: qty 10

## 2020-11-24 MED ORDER — SODIUM CHLORIDE (PF) 0.9 % IJ SOLN
INTRAMUSCULAR | Status: AC
  Filled 2020-11-24: qty 10

## 2020-11-24 NOTE — Progress Notes (Signed)
Safety precautions to be maintained throughout the outpatient stay will include: orient to surroundings, keep bed in low position, maintain call bell within reach at all times, provide assistance with transfer out of bed and ambulation.  

## 2020-11-24 NOTE — Patient Instructions (Signed)
____________________________________________________________________________________________  Post-Procedure Discharge Instructions  Instructions:  Apply ice:   Purpose: This will minimize any swelling and discomfort after procedure.   When: Day of procedure, as soon as you get home.  How: Fill a plastic sandwich bag with crushed ice. Cover it with a small towel and apply to injection site.  How long: (15 min on, 15 min off) Apply for 15 minutes then remove x 15 minutes.  Repeat sequence on day of procedure, until you go to bed.  Apply heat:   Purpose: To treat any soreness and discomfort from the procedure.  When: Starting the next day after the procedure.  How: Apply heat to procedure site starting the day following the procedure.  How long: May continue to repeat daily, until discomfort goes away.  Food intake: Start with clear liquids (like water) and advance to regular food, as tolerated.   Physical activities: Keep activities to a minimum for the first 8 hours after the procedure. After that, then as tolerated.  Driving: If you have received any sedation, be responsible and do not drive. You are not allowed to drive for 24 hours after having sedation.  Blood thinner: (Applies only to those taking blood thinners) You may restart your blood thinner 6 hours after your procedure.  Insulin: (Applies only to Diabetic patients taking insulin) As soon as you can eat, you may resume your normal dosing schedule.  Infection prevention: Keep procedure site clean and dry. Shower daily and clean area with soap and water.  Post-procedure Pain Diary: Extremely important that this be done correctly and accurately. Recorded information will be used to determine the next step in treatment. For the purpose of accuracy, follow these rules:  Evaluate only the area treated. Do not report or include pain from an untreated area. For the purpose of this evaluation, ignore all other areas of pain,  except for the treated area.  After your procedure, avoid taking a long nap and attempting to complete the pain diary after you wake up. Instead, set your alarm clock to go off every hour, on the hour, for the initial 8 hours after the procedure. Document the duration of the numbing medicine, and the relief you are getting from it.  Do not go to sleep and attempt to complete it later. It will not be accurate. If you received sedation, it is likely that you were given a medication that may cause amnesia. Because of this, completing the diary at a later time may cause the information to be inaccurate. This information is needed to plan your care.  Follow-up appointment: Keep your post-procedure follow-up evaluation appointment after the procedure (usually 2 weeks for most procedures, 6 weeks for radiofrequencies). DO NOT FORGET to bring you pain diary with you.   Expect: (What should I expect to see with my procedure?)  From numbing medicine (AKA: Local Anesthetics): Numbness or decrease in pain. You may also experience some weakness, which if present, could last for the duration of the local anesthetic.  Onset: Full effect within 15 minutes of injected.  Duration: It will depend on the type of local anesthetic used. On the average, 1 to 8 hours.   From steroids (Applies only if steroids were used): Decrease in swelling or inflammation. Once inflammation is improved, relief of the pain will follow.  Onset of benefits: Depends on the amount of swelling present. The more swelling, the longer it will take for the benefits to be seen. In some cases, up to 10 days.    Duration: Steroids will stay in the system x 2 weeks. Duration of benefits will depend on multiple posibilities including persistent irritating factors.  Side-effects: If present, they may typically last 2 weeks (the duration of the steroids).  Frequent: Cramps (if they occur, drink Gatorade and take over-the-counter Magnesium 450-500 mg  once to twice a day); water retention with temporary weight gain; increases in blood sugar; decreased immune system response; increased appetite.  Occasional: Facial flushing (red, warm cheeks); mood swings; menstrual changes.  Uncommon: Long-term decrease or suppression of natural hormones; bone thinning. (These are more common with higher doses or more frequent use. This is why we prefer that our patients avoid having any injection therapies in other practices.)   Very Rare: Severe mood changes; psychosis; aseptic necrosis.  From procedure: Some discomfort is to be expected once the numbing medicine wears off. This should be minimal if ice and heat are applied as instructed.  Call if: (When should I call?)  You experience numbness and weakness that gets worse with time, as opposed to wearing off.  New onset bowel or bladder incontinence. (Applies only to procedures done in the spine)  Emergency Numbers:  Durning business hours (Monday - Thursday, 8:00 AM - 4:00 PM) (Friday, 9:00 AM - 12:00 Noon): [phone]  After hours: [phone]  NOTE: If you are having a problem and are unable connect with, or to talk to a provider, then go to your nearest urgent care or emergency department. If the problem is serious and urgent, please call 911. ____________________________________________________________________________________________   Epidural Steroid Injection  An epidural steroid injection is a shot of steroid medicine and numbing medicine that is given into the space between the spinal cord and the bones of the back (epidural space). The shot helps relieve pain caused by an irritated or swollen nerve root. The amount of pain relief you get from the injection depends on what is causing the nerve to be swollen and irritated, and how long your pain lasts. You are more likely to benefit from this injection if your pain is strong and comes on suddenly rather than if you have had  long-term (chronic) pain. Tell a health care provider about:  Any allergies you have.  All medicines you are taking, including vitamins, herbs, eye drops, creams, and over-the-counter medicines.  Any problems you or family members have had with anesthetic medicines.  Any blood disorders you have.  Any surgeries you have had.  Any medical conditions you have.  Whether you are pregnant or may be pregnant. What are the risks? Generally, this is a safe procedure. However, problems may occur, including:  Headache.  Bleeding.  Infection.  Allergic reaction to medicines.  Nerve damage. What happens before the procedure? Staying hydrated Follow instructions from your health care provider about hydration, which may include:  Up to 2 hours before the procedure - you may continue to drink clear liquids, such as water, clear fruit juice, black coffee, and plain tea. Eating and drinking restrictions Follow instructions from your health care provider about eating and drinking, which may include:  8 hours before the procedure - stop eating heavy meals or foods, such as meat, fried foods, or fatty foods.  6 hours before the procedure - stop eating light meals or foods, such as toast or cereal.  6 hours before the procedure - stop drinking milk or drinks that contain milk.  2 hours before the procedure - stop drinking clear liquids. Medicines  You may be given   medicines to lower anxiety.  Ask your health care provider about: ? Changing or stopping your regular medicines. This is especially important if you are taking diabetes medicines or blood thinners. ? Taking medicines such as aspirin and ibuprofen. These medicines can thin your blood. Do not take these medicines unless your health care provider tells you to take them. ? Taking over-the-counter medicines, vitamins, herbs, and supplements.  Ask your health care provider what steps will be taken to prevent infection. General  instructions  Plan to have someone take you home from the hospital or clinic.  If you will be going home right after the procedure, plan to have someone with you for 24 hours. What happens during the procedure?  An IV will be inserted into one of your veins.  You will be given one or more of the following: ? A medicine to help you relax (sedative). ? A medicine to numb the area (local anesthetic).  You will be asked to lie on your abdomen or sit.  The injection site will be cleaned.  A needle will be inserted through your skin into the epidural space. This may cause you some discomfort. An X-ray machine will be used to guide the needle as close as possible to the affected nerve.  A steroid medicine and a local anesthetic will be injected into the epidural space.  The needle and IV will be removed.  A bandage (dressing) will be put over the injection site. The procedure may vary among health care providers and hospitals. What can I expect after the procedure? Follow these instructions at home: Injection site care  You may remove the bandage (dressing) after 24 hours.  Check your injection site every day for signs of infection. Check for: ? Redness, swelling, or pain. ? Fluid or blood. ? Warmth. ? Pus or a bad smell. Managing pain, stiffness, and swelling  For 24 hours after the procedure: ? Avoid using heat on the injection site. ? Do not take baths, swim, or use a hot tub until your health care provider approves. Ask your health care provider if you may take a shower. You may only be allowed to take sponge baths.  If directed, put ice on the injection site. To do this: ? Put ice in a plastic bag. ? Place a towel between your skin and the bag. ? Leave the ice on for 20 minutes, 2-3 times a day.  Activity  Do not drive for 24 hours if you were given a sedative during your procedure.  Return to your normal activities as told by your health care provider. Ask your  health care provider what activities are safe for you. General instructions  Your blood pressure, heart rate, breathing rate, and blood oxygen level will be monitored until you leave the hospital or clinic.  Your arm or leg may feel weak or numb for a few hours.  The injection site may feel sore.  Take over-the-counter and prescription medicines only as told by your health care provider.  Drink enough fluid to keep your urine pale yellow.  Keep all follow-up visits as told by your health care provider. This is important. Contact a health care provider if:  You have any of these signs of infection: ? Redness, swelling, or pain around your injection site. ? Fluid or blood coming from your injection site. ? Warmth coming from your injection site. ? Pus or a bad smell coming from your injection site. ? A fever.  You continue to   have pain and soreness around the injection site, even after taking over-the-counter pain medicine.  You have severe, sudden, or lasting nausea or vomiting. Get help right away if:  You have severe pain at the injection site that is not relieved by medicines.  You develop a severe headache or a stiff neck.  You become sensitive to light.  You have any new numbness or weakness in your legs or arms.  You lose control of your bladder or bowel movements.  You have trouble breathing. Summary  An epidural steroid injection is a shot of steroid medicine and numbing medicine that is given into the epidural space.  The shot helps relieve pain caused by an irritated or swollen nerve root.  You are more likely to benefit from this injection if your pain is strong and comes on suddenly rather than if you have had chronic pain. This information is not intended to replace advice given to you by your health care provider. Make sure you discuss any questions you have with your health care provider. Document Revised: 06/23/2019 Document Reviewed: 06/23/2019 [employer]  Patient Education  2020 [employer]  

## 2020-11-24 NOTE — Progress Notes (Signed)
PROVIDER NOTE: Information contained herein reflects review and annotations entered in association with encounter. Interpretation of such information and data should be left to medically-trained personnel. Information provided to patient can be located elsewhere in the medical record under "Patient Instructions". Document created using STT-dictation technology, any transcriptional errors that may result from process are unintentional.    Patient: Patrick Moyer  Service Category: Procedure  Provider: Edward Jolly, MD  DOB: [birth year]  DOS: 11/24/2020  Location: [facility]  MRN: [id number]  Setting: Ambulatory - outpatient  Referring Provider: Edward Jolly, MD  Type: Established Patient  Specialty: Interventional Pain Management  PCP: Daisy Floro, MD   Primary Reason for Visit: Interventional Pain Management Treatment. CC: Leg Pain (lateral aspect of left) and Hip Pain (left)  Procedure:          Anesthesia, Analgesia, Anxiolysis:  Type: Diagnostic Inter-Laminar Epidural Steroid Injection  #1  Region: Lumbar Level: L4-5 Level. Laterality: Left-Sided         Type: Local Anesthesia  Local Anesthetic: Lidocaine 1-2%  Position: Prone with head of the table was raised to facilitate breathing.   Indications: 1. Chronic radicular lumbar pain   2. Lumbar radiculopathy   3. Chronic pain syndrome    Pain Score: Pre-procedure: 4 /10 Post-procedure: 2 /10   Pre-op H&P Assessment:  Patrick Moyer is a 69 y.o. (year old), male patient, seen today for interventional treatment. He  has a past surgical history that includes Nasal septum surgery (1975); Wisdom tooth extraction (1975); Finger exploration (Left, 1995); Laparoscopic appendectomy (2010); Inguinal hernia repair (Left, 1978); and Transurethral resection of prostate (N/A, 05/05/2020). Patrick Moyer has a current medication list which includes the following prescription(s): acetaminophen, acyclovir ointment, b complex vitamins, ca  carbonate-mag hydroxide, celecoxib, cholecalciferol, fexofenadine, fiber, fluticasone, gabapentin, glucosamine-chondroitin, guaifenesin, l-lysine, montelukast, nystatin, omeprazole, potassium gluconate, xylitol, hydrocodone-acetaminophen, krill oil, oxybutynin, phenazopyridine, sam-e, and tamsulosin. His primarily concern today is the Leg Pain (lateral aspect of left) and Hip Pain (left)  Initial Vital Signs:  Pulse/HCG Rate: 70  Temp: 98.4 F (36.9 C) Resp: 16 BP: 112/82 SpO2: 98 %  BMI: Estimated body mass index is 21.14 kg/m as calculated from the following:   Height as of this encounter: 6' 8.6" (2.047 m).   Weight as of this encounter: 195 lb 4.8 oz (88.6 kg).  Risk Assessment: Allergies: Reviewed. He is allergic to other, cialis [tadalafil], sildenafil, and soy protein [soybean oil].  Allergy Precautions: None required Coagulopathies: Reviewed. None identified.  Blood-thinner therapy: None at this time Active Infection(s): Reviewed. None identified. Patrick Moyer is afebrile  Site Confirmation: Patrick Moyer was asked to confirm the procedure and laterality before marking the site Procedure checklist: Completed Consent: Before the procedure and under the influence of no sedative(s), amnesic(s), or anxiolytics, the patient was informed of the treatment options, risks and possible complications. To fulfill our ethical and legal obligations, as recommended by the American Medical Association's Code of Ethics, I have informed the patient of my clinical impression; the nature and purpose of the treatment or procedure; the risks, benefits, and possible complications of the intervention; the alternatives, including doing nothing; the risk(s) and benefit(s) of the alternative treatment(s) or procedure(s); and the risk(s) and benefit(s) of doing nothing. The patient was provided information about the general risks and possible complications associated with the procedure. These may include, but are  not limited to: failure to achieve desired goals, infection, bleeding, organ or nerve damage, allergic reactions, paralysis, and death. In addition,  the patient was informed of those risks and complications associated to Spine-related procedures, such as failure to decrease pain; infection (i.e.: Meningitis, epidural or intraspinal abscess); bleeding (i.e.: epidural hematoma, subarachnoid hemorrhage, or any other type of intraspinal or peri-dural bleeding); organ or nerve damage (i.e.: Any type of peripheral nerve, nerve root, or spinal cord injury) with subsequent damage to sensory, motor, and/or autonomic systems, resulting in permanent pain, numbness, and/or weakness of one or several areas of the body; allergic reactions; (i.e.: anaphylactic reaction); and/or death. Furthermore, the patient was informed of those risks and complications associated with the medications. These include, but are not limited to: allergic reactions (i.e.: anaphylactic or anaphylactoid reaction(s)); adrenal axis suppression; blood sugar elevation that in diabetics may result in ketoacidosis or comma; water retention that in patients with history of congestive heart failure may result in shortness of breath, pulmonary edema, and decompensation with resultant heart failure; weight gain; swelling or edema; medication-induced neural toxicity; particulate matter embolism and blood vessel occlusion with resultant organ, and/or nervous system infarction; and/or aseptic necrosis of one or more joints. Finally, the patient was informed that Medicine is not an exact science; therefore, there is also the possibility of unforeseen or unpredictable risks and/or possible complications that may result in a catastrophic outcome. The patient indicated having understood very clearly. We have given the patient no guarantees and we have made no promises. Enough time was given to the patient to ask questions, all of which were answered to the patient's  satisfaction. Patrick Moyer has indicated that he wanted to continue with the procedure. Attestation: I, the ordering provider, attest that I have discussed with the patient the benefits, risks, side-effects, alternatives, likelihood of achieving goals, and potential problems during recovery for the procedure that I have provided informed consent. Date  Time: 11/24/2020 11:39 AM  Pre-Procedure Preparation:  Monitoring: As per clinic protocol. Respiration, ETCO2, SpO2, BP, heart rate and rhythm monitor placed and checked for adequate function Safety Precautions: Patient was assessed for positional comfort and pressure points before starting the procedure. Time-out: I initiated and conducted the "Time-out" before starting the procedure, as per protocol. The patient was asked to participate by confirming the accuracy of the "Time Out" information. Verification of the correct person, site, and procedure were performed and confirmed by me, the nursing staff, and the patient. "Time-out" conducted as per Joint Commission's Universal Protocol (UP.01.01.01). Time: 1222  Description of Procedure:          Target Area: The interlaminar space, initially targeting the lower laminar border of the superior vertebral body. Approach: Paramedial approach. Area Prepped: Entire Posterior Lumbar Region DuraPrep (Iodine Povacrylex [0.7% available iodine] and Isopropyl Alcohol, 74% w/w) Safety Precautions: Aspiration looking for blood return was conducted prior to all injections. At no point did we inject any substances, as a needle was being advanced. No attempts were made at seeking any paresthesias. Safe injection practices and needle disposal techniques used. Medications properly checked for expiration dates. SDV (single dose vial) medications used. Description of the Procedure: Protocol guidelines were followed. The procedure needle was introduced through the skin, ipsilateral to the reported pain, and advanced to the  target area. Bone was contacted and the needle walked caudad, until the lamina was cleared. The epidural space was identified using "loss-of-resistance technique" with 2-3 ml of PF-NaCl (0.9% NSS), in a 5cc LOR glass syringe.  Vitals:   11/24/20 1151 11/24/20 1221 11/24/20 1225  BP: 112/82 128/69 132/72  Pulse: 70 (!) 59 60  Resp: 16 18 16   Temp: 98.4 F (36.9 C)    TempSrc: Temporal    SpO2: 98% 98% 97%  Weight: 195 lb 4.8 oz (88.6 kg)    Height: 6' 8.6" (2.047 m)      Start Time: 1222 hrs. End Time: 1223 hrs.  Materials:  Needle(s) Type: Epidural needle Gauge: 22G Length: 3.5-in Medication(s): Please see orders for medications and dosing details. 6 cc solution made of 3 cc of preservative-free saline, 2 cc of 0.2% ropivacaine, 1 cc of Decadron 10 mg/cc.  Imaging Guidance (Spinal):          Type of Imaging Technique: Fluoroscopy Guidance (Spinal) Indication(s): Assistance in needle guidance and placement for procedures requiring needle placement in or near specific anatomical locations not easily accessible without such assistance. Exposure Time: Please see nurses notes. Contrast: Before injecting any contrast, we confirmed that the patient did not have an allergy to iodine, shellfish, or radiological contrast. Once satisfactory needle placement was completed at the desired level, radiological contrast was injected. Contrast injected under live fluoroscopy. No contrast complications. See chart for type and volume of contrast used. Fluoroscopic Guidance: I was personally present during the use of fluoroscopy. "Tunnel Vision Technique" used to obtain the best possible view of the target area. Parallax error corrected before commencing the procedure. "Direction-depth-direction" technique used to introduce the needle under continuous pulsed fluoroscopy. Once target was reached, antero-posterior, oblique, and lateral fluoroscopic projection used confirm needle placement in all planes.  Images permanently stored in EMR. Interpretation: I personally interpreted the imaging intraoperatively. Adequate needle placement confirmed in multiple planes. Appropriate spread of contrast into desired area was observed. No evidence of afferent or efferent intravascular uptake. No intrathecal or subarachnoid spread observed. Permanent images saved into the patient's record.  Antibiotic Prophylaxis:   Anti-infectives (From admission, onward)   None     Indication(s): None identified  Post-operative Assessment:  Post-procedure Vital Signs:  Pulse/HCG Rate: 60 (sb)  Temp: 98.4 F (36.9 C) Resp: 16 BP: 132/72 SpO2: 97 %  EBL: None  Complications: No immediate post-treatment complications observed by team, or reported by patient.  Note: The patient tolerated the entire procedure well. A repeat set of vitals were taken after the procedure and the patient was kept under observation following institutional policy, for this type of procedure. Post-procedural neurological assessment was performed, showing return to baseline, prior to discharge. The patient was provided with post-procedure discharge instructions, including a section on how to identify potential problems. Should any problems arise concerning this procedure, the patient was given instructions to immediately contact , at any time, without hesitation. In any case, we plan to contact the patient by telephone for a follow-up status report regarding this interventional procedure.  Comments:  No additional relevant information.  Plan of Care  Orders:  Orders Placed This Encounter  Procedures  . DG PAIN CLINIC C-ARM 1-60 MIN NO REPORT    Intraoperative interpretation by procedural physician at [facility].    Standing Status:   Standing    Number of Occurrences:   1    Order Specific Question:   Reason for exam:    Answer:   Assistance in needle guidance and placement for procedures requiring needle placement in or  near specific anatomical locations not easily accessible without such assistance.   Medications ordered for procedure: Meds ordered this encounter  Medications  . iohexol (OMNIPAQUE) 180 MG/ML injection 10 mL    Must be Myelogram-compatible. If not available, you may  substitute with a water-soluble, non-ionic, hypoallergenic, myelogram-compatible radiological contrast medium.  Marland Kitchen lidocaine (XYLOCAINE) 2 % (with pres) injection 400 mg  . ropivacaine (PF) 2 mg/mL (0.2%) (NAROPIN) injection 2 mL  . sodium chloride flush (NS) 0.9 % injection 3 mL  . dexamethasone (DECADRON) injection 10 mg  . gabapentin (NEURONTIN) 300 MG capsule    Sig: 2 tabs with each meal, 3 at bedtime    Dispense:  180 capsule    Refill:  3   Medications administered: We administered iohexol, lidocaine, ropivacaine (PF) 2 mg/mL (0.2%), sodium chloride flush, and dexamethasone.  See the medical record for exact dosing, route, and time of administration.  Follow-up plan:   Return in about 6 weeks (around 01/05/2021) for Post Procedure Evaluation, virtual.      LEFT L4/5 ESI 11/24/20   Recent Visits Date Type Provider Dept  10/13/20 Office Visit Edward Jolly, MD [facility]  Showing recent visits within past 90 days and meeting all other requirements Today's Visits Date Type Provider Dept  11/24/20 Procedure visit Edward Jolly, MD [facility]  Showing today's visits and meeting all other requirements Future Appointments Date Type Provider Dept  01/05/21 Appointment Edward Jolly, MD [facility]  Showing future appointments within next 90 days and meeting all other requirements  Disposition: Discharge home  Discharge (Date  Time): 11/24/2020; 1235 hrs.   Primary Care Physician: Daisy Floro, MD Location: [facility] Note by: Edward Jolly, MD Date: 11/24/2020; Time: 1:11 PM  Disclaimer:  Medicine is not an exact science. The only guarantee in  medicine is that nothing is guaranteed. It is important to note that the decision to proceed with this intervention was based on the information collected from the patient. The Data and conclusions were drawn from the patient's questionnaire, the interview, and the physical examination. Because the information was provided in large part by the patient, it cannot be guaranteed that it has not been purposely or unconsciously manipulated. Every effort has been made to obtain as much relevant data as possible for this evaluation. It is important to note that the conclusions that lead to this procedure are derived in large part from the available data. Always take into account that the treatment will also be dependent on availability of resources and existing treatment guidelines, considered by other Pain Management Practitioners as being common knowledge and practice, at the time of the intervention. For Medico-Legal purposes, it is also important to point out that variation in procedural techniques and pharmacological choices are the acceptable norm. The indications, contraindications, technique, and results of the above procedure should only be interpreted and judged by a Board-Certified Interventional Pain Specialist with extensive familiarity and expertise in the same exact procedure and technique.

## 2020-11-25 ENCOUNTER — Telehealth: Payer: Self-pay

## 2020-11-25 NOTE — Telephone Encounter (Signed)
Pt was called and no problems reported. 

## 2020-12-27 ENCOUNTER — Encounter: Payer: Self-pay | Admitting: Cardiology

## 2020-12-27 ENCOUNTER — Ambulatory Visit: Payer: Medicare PPO | Admitting: Cardiology

## 2020-12-27 ENCOUNTER — Other Ambulatory Visit: Payer: Self-pay

## 2020-12-27 VITALS — BP 132/72 | HR 57 | Ht >= 80 in | Wt 202.4 lb

## 2020-12-27 DIAGNOSIS — I7121 Aneurysm of the ascending aorta, without rupture: Secondary | ICD-10-CM

## 2020-12-27 DIAGNOSIS — I712 Thoracic aortic aneurysm, without rupture: Secondary | ICD-10-CM | POA: Diagnosis not present

## 2020-12-27 DIAGNOSIS — E785 Hyperlipidemia, unspecified: Secondary | ICD-10-CM | POA: Diagnosis not present

## 2020-12-27 DIAGNOSIS — I25119 Atherosclerotic heart disease of native coronary artery with unspecified angina pectoris: Secondary | ICD-10-CM

## 2020-12-27 LAB — COMPREHENSIVE METABOLIC PANEL
ALT: 14 IU/L (ref 0–44)
AST: 18 IU/L (ref 0–40)
Albumin/Globulin Ratio: 2 (ref 1.2–2.2)
Albumin: 4.3 g/dL (ref 3.8–4.8)
Alkaline Phosphatase: 65 IU/L (ref 44–121)
BUN/Creatinine Ratio: 16 (ref 10–24)
BUN: 11 mg/dL (ref 8–27)
Bilirubin Total: 0.5 mg/dL (ref 0.0–1.2)
CO2: 25 mmol/L (ref 20–29)
Calcium: 9.1 mg/dL (ref 8.6–10.2)
Chloride: 107 mmol/L — ABNORMAL HIGH (ref 96–106)
Creatinine, Ser: 0.7 mg/dL — ABNORMAL LOW (ref 0.76–1.27)
GFR calc Af Amer: 112 mL/min/{1.73_m2} (ref >59)
GFR calc non Af Amer: 97 mL/min/{1.73_m2} (ref >59)
Globulin, Total: 2.2 g/dL (ref 1.5–4.5)
Glucose: 87 mg/dL (ref 65–99)
Potassium: 4.1 mmol/L (ref 3.5–5.2)
Sodium: 142 mmol/L (ref 134–144)
Total Protein: 6.5 g/dL (ref 6.0–8.5)

## 2020-12-27 LAB — LIPID PANEL
Chol/HDL Ratio: 3.6 ratio (ref 0.0–5.0)
Cholesterol, Total: 174 mg/dL (ref 100–199)
HDL: 49 mg/dL (ref >39)
LDL Chol Calc (NIH): 112 mg/dL — ABNORMAL HIGH (ref 0–99)
Triglycerides: 69 mg/dL (ref 0–149)
VLDL Cholesterol Cal: 13 mg/dL (ref 5–40)

## 2020-12-27 NOTE — Patient Instructions (Signed)
Medication Instructions:   Your physician recommends that you continue on your current medications as directed. Please refer to the Current Medication list given to you today.  *If you need a refill on your cardiac medications before your next appointment, please call your pharmacy*   Lab Work:  TODAY--CMET AND LIPIDS  If you have labs (blood work) drawn today and your tests are completely normal, you will receive your results only by: Marland Kitchen MyChart Message (if you have MyChart) OR . A paper copy in the mail If you have any lab test that is abnormal or we need to change your treatment, we will call you to review the results.   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  As part of our continuing mission to provide you with exceptional heart care, we have created designated Provider Care Teams.  These Care Teams include your primary Cardiologist (physician) and Advanced Practice Providers (APPs -  Physician Assistants and Nurse Practitioners) who all work together to provide you with the care you need, when you need it.  We recommend signing up for the patient portal called "MyChart".  Sign up information is provided on this After Visit Summary.  MyChart is used to connect with patients for Virtual Visits (Telemedicine).  Patients are able to view lab/test results, encounter notes, upcoming appointments, etc.  Non-urgent messages can be sent to your provider as well.   To learn more about what you can do with MyChart, go to [email or web address].    Your next appointment:   1 year(s)  The format for your next appointment:   In Person  Provider:   Tobias Alexander, MD

## 2020-12-27 NOTE — Progress Notes (Signed)
Cardiology Office Note:    Date:  12/27/2020   ID:  Patrick Moyer, DOB 06/12/1951, MRN [id number]  PCP:  Lawerance Cruel, MD  Cardiologist:  Ena Dawley  Referring MD: Lawerance Cruel, MD   Reason for visit: Annual follow up   History of Present Illness:    Patrick Moyer is a 70 y.o. male with a hx of ascending aortic aneurysm, Marfanoid habitus, the patient has a significant family history of Marfan syndrome, his brother was diagnosed with Marfan syndrome, his father died of ascending aortic dissection and coronary dissection, his daughter is unusually tall with longer arm span then body legs when she was growing up.  The patient is fairly active, and denies dyspnea on exertion however gets occasional sharp chest pain across his chest.  Denies any lower extremity edema orthopnea proximal nocturnal dyspnea.  12/27/2020 -the patient is coming for a follow-up, he has been doing great, he continues to swim 4 to 6 miles per week at the [place] and has no signs of chest pain or shortness of breath, his only complaint is radiculopathy in the L5-S1 region for which he is seeing a therapist and received epidural injection with minimal improvement.  He states that his blood pressure is well controlled in the 110s range at home.  He has been taking niacin for his cholesterol with side effects of flushing.  Denies any lower extremity edema no orthopnea proximal nocturnal dyspnea.  Past Medical History:  Diagnosis Date  . AAA (abdominal aortic aneurysm) [facility])    cardiologist--- dr Liane Comber--- CTA 10-22-2019  ascending aorta 61m and aortic root 473m . ALLERGIC RHINITIS   . Arthritis pain of wrist   . Bell's palsy    per pt 1987 left side, 20% residual effect's eyelid  . Benign localized prostatic hyperplasia with lower urinary tract symptoms (LUTS)   . Chronic bilateral low back pain with bilateral sciatica   . Diverticulosis   . ED (erectile dysfunction)   .  Environmental and seasonal allergies   . Family history of Marfan syndrome    father, paternal uncle, and brother  . GERD (gastroesophageal reflux disease)   . Hiatal hernia   . High cholesterol   . History of closed head injury    04-28-2020 per pt yrs ago w/ no loc, residual mild memory loss which has resolved  . 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 70 per pt treated with INH for one year and cxr's since have been normal  . HSV-1 infection   . Marfanoid habitus    per pt has wing span is longer than his height, effects eyes (iris), hyperflexibility of ankle and hands,  AAA  . Mild CAD    cardiologist--  dr neMeda Coffee-  CTA 10-22-2019  . Non-celiac gluten sensitivity   . Numbness and tingling in both hands    per pt intermittantly due to cervical spine  . OSA on CPAP    auto 8-10,  pt stated uses every night  . 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)  . Polyneuropathy    left leg, lumbar nerve   . Pulmonary nodules    per pt followed by pcp (CTA and CT chest 10-22-2019 , stable)  . Radiculopathy of leg   . Wears glasses    Past Surgical History:  Procedure Laterality Date  . FINGER EXPLORATION Left 1995   left middle finger digital  nerve repair  . INGUINAL HERNIA REPAIR Left 1978  . LAPAROSCOPIC APPENDECTOMY  2010  . NASAL SEPTUM SURGERY  1975  . TRANSURETHRAL RESECTION OF PROSTATE N/A 05/05/2020   Procedure: TRANSURETHRAL RESECTION OF THE PROSTATE (TURP), BIPOLAR;  Surgeon: Ceasar Mons, MD;  Location: [facility];  Service: Urology;  Laterality: N/A;  . WISDOM TOOTH EXTRACTION  1975   Current Medications: Current Meds  Medication Sig  . acetaminophen (TYLENOL) 500 MG tablet Take 500 mg by mouth at bedtime.  Marland Kitchen acyclovir ointment (ZOVIRAX) 5 % Apply 1 application topically every 3 (three) hours.   . Artificial Saliva (BIOTENE DRY MOUTH) LOZG Use as directed in the mouth or throat.  . B Complex Vitamins (B  COMPLEX 100 PO) Take by mouth 2 (two) times daily.   . Ca Carbonate-Mag Hydroxide (ROLAIDS PO) Take by mouth as needed.  . celecoxib (CELEBREX) 200 MG capsule Take 200 mg by mouth 2 (two) times daily.  . cholecalciferol (VITAMIN D) 1000 UNITS tablet Take 5,000 Units by mouth daily.  . fexofenadine (ALLEGRA) 180 MG tablet Take 180 mg by mouth daily.  Marland Kitchen FIBER PO Take by mouth daily.  . fluticasone (FLONASE) 50 MCG/ACT nasal spray Place into both nostrils 3 (three) times daily.  . folic acid (FOLVITE) 1 MG tablet Take 1 mg by mouth daily.  Marland Kitchen gabapentin (NEURONTIN) 300 MG capsule 2 tabs with each meal, 3 at bedtime  . glucosamine-chondroitin 500-400 MG tablet Take 1 tablet by mouth daily.  Marland Kitchen guaiFENesin (MUCINEX) 600 MG 12 hr tablet Take 1,200 mg by mouth.   . L-Lysine 500 MG CAPS Take 1 capsule by mouth in the morning, at noon, and at bedtime.   . methotrexate (RHEUMATREX) 2.5 MG tablet Take 10 mg by mouth once a week. Caution:Chemotherapy. Protect from light.  . montelukast (SINGULAIR) 10 MG tablet Take 10 mg by mouth at bedtime.   Marland Kitchen nystatin (MYCOSTATIN) 100000 UNIT/ML suspension Take 5 mLs by mouth as needed.   Marland Kitchen omeprazole (PRILOSEC) 20 MG capsule Take 20 mg by mouth daily.  . Potassium Gluconate 550 (90 K) MG TABS Take by mouth daily.  Marland Kitchen VALACYCLOVIR HCL PO Take by mouth.    Allergies:   Other, Cialis [tadalafil], Sildenafil, and Soy protein [place] oil]   Social History   Socioeconomic History  . Marital status: Married    Spouse name: Not on file  . Number of children: 2  . Years of education: Not on file  . Highest education level: Not on file  Occupational History  . 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 Smoking status: Never Smoker  . Smokeless tobacco: Never Used  Vaping Use  . Vaping Use: Never used  Substance and Sexual Activity  . Alcohol use: No  . Drug use: Never  . Sexual activity: Not on file  Other Topics Concern  . Not on  file  Social History Narrative  . Not on file   Social Determinants of Health   Financial Resource Strain: Not on file  Food Insecurity: Not on file  Transportation Needs: Not on file  Physical Activity: Not on file  Stress: Not on file  Social Connections: Not on file    Family History: The patient's family history includes Aortic aneurysm in his father; Bipolar disorder in his brother; CAD in his father; Glaucoma in his brother; Hypertension in his father; Marfan syndrome in his brother; Obesity in his brother; Osteoarthritis in his  sister; Ovarian cancer in his mother.  ROS:   Please see the history of present illness.    All other systems reviewed and are negative.  EKGs/Labs/Other Studies Reviewed:    The following studies were reviewed today:  EKG:  EKG is ordered today.  The ekg ordered today demonstrates normal sinus rhythm, LVH, borderline EKG, no prior available for comparison, personally reviewed.  Recent Labs: 05/06/2020: BUN 13; Creatinine, Ser 0.64; Potassium 4.1; Sodium 141  Recent Lipid Panel    Component Value Date/Time   CHOL 128 12/25/2019 0850   TRIG 52 12/25/2019 0850   HDL 54 12/25/2019 0850   CHOLHDL 2.4 12/25/2019 0850   LDLCALC 62 12/25/2019 0850   Physical Exam:    VS:  BP 132/72   Pulse (!) 57   Ht 6' 8.6" (2.047 m)   Wt 202 lb 6.4 oz (91.8 kg)   SpO2 97%   BMI 21.90 kg/m     Wt Readings from Last 3 Encounters:  12/27/20 202 lb 6.4 oz (91.8 kg)  11/24/20 195 lb 4.8 oz (88.6 kg)  10/13/20 190 lb (86.2 kg)    GEN: Well nourished, very tall male with long arm span, in no acute distress HEENT: Normal NECK: No JVD; No carotid bruits LYMPHATICS: No lymphadenopathy CARDIAC: RRR, 2 out of 6 diastolic murmurs, rubs, gallops RESPIRATORY:  Clear to auscultation without rales, wheezing or rhonchi  ABDOMEN: Soft, non-tender, non-distended MUSCULOSKELETAL:  No edema; No deformity  SKIN: Warm and dry NEUROLOGIC:  Alert and oriented x  3 PSYCHIATRIC:  Normal affect   Coronary CTA: 10/22/2019  1. Coronary calcium score of 57. This was 52 percentile for age and sex matched control.  2. Normal coronary origin with right dominance.  3. CAD-RADS 1. Minimal non-obstructive CAD (0-24%) in the proximal LAD. Consider non-atherosclerotic causes of chest pain. Consider preventive therapy and risk factor modification.  4. Mildly dilated aortic root at the sinus level with maximum diameter 43 mm, upper normal size of the ascending aorta measuring 40 mm. When compared to the prior study from 05/07/2018 there has been no change.    ASSESSMENT:    1. Ascending aortic aneurysm ([place])   2. Hyperlipidemia, unspecified hyperlipidemia type   3. Coronary artery disease involving native heart with angina pectoris, unspecified vessel or lesion type ([place])    PLAN:    In order of problems listed above:  1. Ascending aortic aneurysm in the settings of marfanoid body habitus and family history of Marfan syndrome and ascending aortic aneurysm.  Chest CTA on May 07, 2018 showed ascending aortic dimension 41 mm and aortic root dimension at the sinuses level 43 mm. Repeat chest CTA in 09/2019 showed unchanged diameter at the sinus level 43 mm, while the maximum diameter at the ascending aorta was 40 mm. We will repeat the chest CTA now. 2. His echocardiogram showed no evidence for aortic insufficiency what is reassuring. 3. Minimal nonobstructive CAD with calcified plaque with only 0 to 24% stenosis in the proximal LAD, he is exercising daily and eating healthy. 4. Hyperlipidemia with triglycerides 52 HDL 54 and LDL 62, he is taking niacin and has side effects he is advised to stop especially in the settings of minimal nonobstructive CAD, he is advised to start taking 2 g of over-the-counter fish oil.  Medication Adjustments/Labs and Tests Ordered: Current medicines are reviewed at length with the patient today.  Concerns regarding medicines  are outlined above.  Orders Placed This Encounter  Procedures  .  CT ANGIO CHEST AORTA W/CM & OR WO/CM  . Comp Met (CMET)  . Lipid Profile  . EKG 12-Lead   No orders of the defined types were placed in this encounter.  Patient Instructions  Medication Instructions:   Your physician recommends that you continue on your current medications as directed. Please refer to the Current Medication list given to you today.  *If you need a refill on your cardiac medications before your next appointment, please call your pharmacy*   Lab Work:  TODAY--CMET AND LIPIDS  If you have labs (blood work) drawn today and your tests are completely normal, you will receive your results only by: Marland Kitchen MyChart Message (if you have MyChart) OR . A paper copy in the mail If you have any lab test that is abnormal or we need to change your treatment, we will call you to review the results.   Testing/Procedures:  CHEST CT ANGIO OF AORTA W/WO CONTRAST TO BE DONE HERE IN THE OFFICE TO FOLLOW-UP ON ASCENDING AORTIC ANEURYSM    Follow-Up: At [facility], you and your health needs are our priority.  As part of our continuing mission to provide you with exceptional heart care, we have created designated Provider Care Teams.  These Care Teams include your primary Cardiologist (physician) and Advanced Practice Providers (APPs -  Physician Assistants and Nurse Practitioners) who all work together to provide you with the care you need, when you need it.  We recommend signing up for the patient portal called "MyChart".  Sign up information is provided on this After Visit Summary.  MyChart is used to connect with patients for Virtual Visits (Telemedicine).  Patients are able to view lab/test results, encounter notes, upcoming appointments, etc.  Non-urgent messages can be sent to your provider as well.   To learn more about what you can do with MyChart, go to [email or web address].    Your next appointment:   1  year(s)  The format for your next appointment:   In Person  Provider:   Ena Dawley, MD        Signed, Ena Dawley, MD  12/27/2020 12:18 PM    [place]

## 2020-12-28 ENCOUNTER — Telehealth: Payer: Self-pay

## 2020-12-28 ENCOUNTER — Telehealth: Payer: Self-pay | Admitting: *Deleted

## 2020-12-28 DIAGNOSIS — Z789 Other specified health status: Secondary | ICD-10-CM

## 2020-12-28 DIAGNOSIS — Z79899 Other long term (current) drug therapy: Secondary | ICD-10-CM

## 2020-12-28 DIAGNOSIS — I25119 Atherosclerotic heart disease of native coronary artery with unspecified angina pectoris: Secondary | ICD-10-CM

## 2020-12-28 DIAGNOSIS — I7121 Aneurysm of the ascending aorta, without rupture: Secondary | ICD-10-CM

## 2020-12-28 DIAGNOSIS — I712 Thoracic aortic aneurysm, without rupture: Secondary | ICD-10-CM

## 2020-12-28 DIAGNOSIS — E785 Hyperlipidemia, unspecified: Secondary | ICD-10-CM

## 2020-12-28 MED ORDER — EZETIMIBE 10 MG PO TABS: 10.0000 mg | ORAL_TABLET | Freq: Every day | ORAL | 1 refills | Status: DC

## 2020-12-28 NOTE — Telephone Encounter (Signed)
Spoke with the pt with Dr. Lindaann Slough recommendations for him to go ahead and restart his zetia 10 mg po daily, and if he's interested, refer him to lipid clinic to see our Pharmacist, for statin intolerance, CAD, HLP, and medication management. Confirmed the pharmacy of choice with the pt. Pt request only a month supply of his zetia to be sent in, since he is interested in seeing lipid clinic, and his management possibly changing in the future thereafter.  Referral to lipid clinic placed.  Pt is aware that someone from our [facility] scheduling pool will call him soon to arrange this appt. Pt verbalized understanding and agrees with this plan.

## 2020-12-28 NOTE — Telephone Encounter (Signed)
Attempted to call patient. He answered phone and was talking to someone else,like he was at work. He put me on call and after holding 5 min. He never responded. Tried to call back. No success.

## 2020-12-28 NOTE — Telephone Encounter (Signed)
Zetia is good, but would he be interested in seeing our lipid clinic?

## 2020-12-28 NOTE — Addendum Note (Signed)
Addended by: Loa Socks on: 12/28/2020 12:01 PM   Modules accepted: Orders

## 2020-12-28 NOTE — Telephone Encounter (Signed)
Spoke with the pt and endorsed to him lab results and recommendations per Dr. Delton See.  Pt states he cannot take rosuvastatin, for he tried this in the past and it caused him extreme joint aches (updated intolerance in allergies). Pt states he is interested though, in going back on zetia 10 mg po daily, if Dr. Delton See agrees this will lower his LDL.  Pt states he would like to avoid rosuvastatin and fish oil if possible and take zetia 10 mg po daily instead, based on Dr. Lindaann Slough recommendations.  Informed the pt that I will route this message to Dr. Delton See to review and advise on, and follow-up with him accordingly thereafter.  Pt states he prefers that I mychart message him with the recommendations due to working.  Will send recommendations via mychart.  Pt verbalized understanding and agrees with this plan.

## 2020-12-28 NOTE — Telephone Encounter (Signed)
-----   Message from Lars Masson, MD sent at 12/27/2020  8:47 PM EST ----- The patient has newly elevated LDL now 112 from previous 62, I would recommend that he starts taking minimal dose of rosuvastatin 5 mg daily in addition to over-the-counter fish oil, niacin that he used to take would not address his LDL cholesterol.  He is liver function test and kidney function are normal.

## 2020-12-29 ENCOUNTER — Other Ambulatory Visit: Payer: Self-pay

## 2020-12-29 ENCOUNTER — Ambulatory Visit
Payer: Medicare PPO | Attending: Student in an Organized Health Care Education/Training Program | Admitting: Student in an Organized Health Care Education/Training Program

## 2020-12-29 ENCOUNTER — Encounter: Payer: Self-pay | Admitting: Student in an Organized Health Care Education/Training Program

## 2020-12-29 DIAGNOSIS — G5702 Lesion of sciatic nerve, left lower limb: Secondary | ICD-10-CM | POA: Diagnosis not present

## 2020-12-29 DIAGNOSIS — M47816 Spondylosis without myelopathy or radiculopathy, lumbar region: Secondary | ICD-10-CM

## 2020-12-29 DIAGNOSIS — M5412 Radiculopathy, cervical region: Secondary | ICD-10-CM | POA: Diagnosis not present

## 2020-12-29 DIAGNOSIS — M461 Sacroiliitis, not elsewhere classified: Secondary | ICD-10-CM

## 2020-12-29 DIAGNOSIS — G894 Chronic pain syndrome: Secondary | ICD-10-CM

## 2020-12-29 DIAGNOSIS — M47818 Spondylosis without myelopathy or radiculopathy, sacral and sacrococcygeal region: Secondary | ICD-10-CM

## 2020-12-29 DIAGNOSIS — M4802 Spinal stenosis, cervical region: Secondary | ICD-10-CM

## 2020-12-29 DIAGNOSIS — G8929 Other chronic pain: Secondary | ICD-10-CM

## 2020-12-29 DIAGNOSIS — M5136 Other intervertebral disc degeneration, lumbar region: Secondary | ICD-10-CM

## 2020-12-29 DIAGNOSIS — M51369 Other intervertebral disc degeneration, lumbar region without mention of lumbar back pain or lower extremity pain: Secondary | ICD-10-CM

## 2020-12-29 DIAGNOSIS — M5416 Radiculopathy, lumbar region: Secondary | ICD-10-CM

## 2020-12-29 NOTE — Progress Notes (Signed)
Patient: Patrick Moyer  Service Category: E/M  Provider: Gillis Santa, MD  DOB: Sep 29, 1951  DOS: 12/29/2020  Location: Office  MRN: [id number]  Setting: Ambulatory outpatient  Referring Provider: Lawerance Cruel, MD  Type: Established Patient  Specialty: Interventional Pain Management  PCP: Lawerance Cruel, MD  Location: Home  Delivery: TeleHealth     Virtual Encounter - Pain Management PROVIDER NOTE: Information contained herein reflects review and annotations entered in association with encounter. Interpretation of such information and data should be left to medically-trained personnel. Information provided to patient can be located elsewhere in the medical record under "Patient Instructions". Document created using STT-dictation technology, any transcriptional errors that may result from process are unintentional.    Contact & Pharmacy Preferred: [phone] Home: [phone] (home) Mobile: [phone] (mobile) E-mail: [email or web address]  [facility], [place] [address] Phone: [phone] Fax: [phone]  [id number] [id number]: [phone]: [phone]  Pre-screening  Patrick Moyer offered "in-person" vs "virtual" encounter. He indicated preferring virtual for this encounter.   Reason COVID-19*  Social distancing based on CDC and AMA recommendations.   I contacted Patrick Jumpon 12/29/2020 via video conference.      I clearly identified myself as [name]. I verified that I was speaking with the correct person using two identifiers (Name: [name] and date of birth: 7June 28, 1952.  Consent I sought verbal advanced consent from Patrick Jumpfor virtual visit interactions. I informed Mr. WBrabsonof possible security and privacy concerns, risks, and limitations associated with providing  "not-in-person" medical evaluation and management services. I also informed [name] the availability of "in-person" appointments. Finally, I informed him that there would be a charge for the virtual visit and that he could be  personally, fully or partially, financially responsible for it. Patrick Moyer understanding and agreed to proceed.   Historic Elements   Mr. [name] a 70y.o. year old, male patient evaluated today after our last contact on 11/24/2020. Patrick Moyer has a past medical history of [place], ALLERGIC RHINITIS, Arthritis pain of wrist, Bell's palsy, Benign localized prostatic hyperplasia with lower urinary tract symptoms (LUTS), Chronic bilateral low back pain with bilateral sciatica, Diverticulosis, ED (erectile dysfunction), Environmental and seasonal allergies, Family history of Marfan syndrome, GERD (gastroesophageal reflux disease), Hiatal hernia, High cholesterol, History of closed head injury, History of positive PPD (age 70, HSV-1 infection, Marfanoid habitus, Mild CAD, Non-celiac gluten sensitivity, Numbness and tingling in both hands, OSA on CPAP, Pigment dispersion syndrome, Polyneuropathy, Pulmonary nodules, Radiculopathy of leg, and Wears glasses. He also  has a past surgical history that includes Nasal septum surgery (1975); Wisdom tooth extraction (1975); Finger exploration (Left, 1995); Laparoscopic appendectomy (2010); Inguinal hernia repair (Left, 1978); and Transurethral resection of prostate (N/A, 05/05/2020). [name] a current medication list which includes the following prescription(s): acetaminophen, acyclovir ointment, biotene dry mouth, b complex vitamins, ca carbonate-mag hydroxide, celecoxib, cholecalciferol, ezetimibe, fexofenadine, fiber, fluticasone, folic acid, gabapentin, glucosamine-chondroitin, guaifenesin, l-lysine, methotrexate, montelukast, nystatin, omeprazole, potassium gluconate, and valacyclovir  hcl. He  reports that he has never smoked. He has never used smokeless tobacco. He reports that he does not drink alcohol and does not use drugs. [name] allergic to other, cialis [tadalafil], rosuvastatin, sildenafil, and soy protein [soybean oil].   HPI  Today, he is being contacted for a post-procedure assessment.   Post-Procedure Evaluation  Procedure (11/24/2020):  Type: Diagnostic Inter-Laminar Epidural Steroid Injection  #1  Region: Lumbar Level: L4-5 Level. Laterality: Left-Sided    Sedation: Please see nurses note.  Effectiveness during initial hour after procedure(Ultra-Short Term Relief): 100 %  Local anesthetic used: Long-acting (4-6 hours) Effectiveness: Defined as any analgesic benefit obtained secondary to the administration of local anesthetics. This carries significant diagnostic value as to the etiological location, or anatomical origin, of the pain. Duration of benefit is expected to coincide with the duration of the local anesthetic used.  Effectiveness during initial 4-6 hours after procedure(Short-Term Relief): 100 %   Long-term benefit: Defined as any relief past the pharmacologic duration of the local anesthetics.  Effectiveness past the initial 6 hours after procedure(Long-Term Relief): 0 %   Current benefits: Defined as benefit that persist at this time.   Analgesia:  No benefit Function: No benefit ROM: No benefit   Laboratory Chemistry Profile   Renal Lab Results  Component Value Date   BUN 11 12/27/2020   CREATININE 0.70 (L) 12/27/2020   BCR 16 12/27/2020   GFRAA 112 12/27/2020   GFRNONAA 97 12/27/2020     Hepatic Lab Results  Component Value Date   AST 18 12/27/2020   ALT 14 12/27/2020   ALBUMIN 4.3 12/27/2020   ALKPHOS 65 12/27/2020     Electrolytes Lab Results  Component Value Date   NA 142 12/27/2020   K 4.1 12/27/2020   CL 107 (H) 12/27/2020   CALCIUM 9.1 12/27/2020     Bone No results found for: VD25OH, VD125OH2TOT,  LG9211HE1, DE0814GY1, 25OHVITD1, 25OHVITD2, 25OHVITD3, TESTOFREE, TESTOSTERONE   Inflammation (CRP: Acute Phase) (ESR: Chronic Phase) No results found for: CRP, ESRSEDRATE, LATICACIDVEN     Note: Above Lab results reviewed.   Assessment  The primary encounter diagnosis was Cervical radicular pain. Diagnoses of Foraminal stenosis of cervical region, SI joint arthritis, Piriformis syndrome of left side, Chronic radicular lumbar pain, Lumbar radiculopathy, Chronic pain syndrome, Lumbar facet arthropathy, Lumbar spondylosis, and Lumbar degenerative disc disease were also pertinent to this visit.  Plan of Care  Mr. Patrick Moyer has a current medication list which includes the following long-term medication(s): ezetimibe, fexofenadine, gabapentin, omeprazole, and potassium gluconate.  Unfortunately, no significant pain relief after previous lumbar epidural steroid injection patient feels that he has been exerting himself more since the epidural.  Is now endorsing severe neck and arm pain (states that arms get numb when he is swimming). C-Mri shows Multilevel neural foraminal narrowing greatest on the left at C3-C4 (moderate), bilaterally at C4-C5 (mild/moderate), on the right at C5-C6 (moderate/severe) and on the left at C6-C7 (mild/moderate).  Moderate C5-C6 disc degeneration. No more than mild disc degeneration at the remaining levels.  Central disc protrusions at T1-T2 and T2-T3. The T2-T3 disc protrusion contacts and mildly flattens the ventral spinal cord.  Discussed C-ESI with Ed. Risks and benefit reviewed  Consider left sacroiliac joint injection, left piriformis injection in future.  Pharmacotherapy (Medications Ordered): No orders of the defined types were placed in this encounter.  Orders:  Orders Placed This Encounter  Procedures  . Cervical Epidural Injection    Level(s): C7-T1 Laterality: TBD Purpose: Diagnostic/Therapeutic Indication(s): Radiculitis and cervicalgia  associater with cervical degenerative disc disease.    Standing Status:   Future    Standing Expiration Date:   01/29/2021    Scheduling Instructions:     Procedure: Cervical Epidural Steroid Injection/Block  Sedation: Patient's choice.     Timeframe: As soon as schedule allows    Order Specific Question:   Where will this procedure be performed?    Answer:   [facility]    Comments:   Jenika Chiem   Follow-up plan:   Return in about 2 weeks (around 01/12/2021) for C-ESI w/o sedation.     LEFT L4/5 ESI 11/24/20: Not effective    Recent Visits Date Type Provider Dept  11/24/20 Procedure visit Gillis Santa, MD [facility]  10/13/20 Office Visit Gillis Santa, MD [facility]  Showing recent visits within past 90 days and meeting all other requirements Today's Visits Date Type Provider Dept  12/29/20 Telemedicine Gillis Santa, MD [facility]  Showing today's visits and meeting all other requirements Future Appointments No visits were found meeting these conditions. Showing future appointments within next 90 days and meeting all other requirements  I discussed the assessment and treatment plan with the patient. The patient was provided an opportunity to ask questions and all were answered. The patient agreed with the plan and demonstrated an understanding of the instructions.  Patient advised to call back or seek an in-person evaluation if the symptoms or condition worsens.  Duration of encounter: 15 minutes.  Note by: Gillis Santa, MD Date: 12/29/2020; Time: 3:23 PM

## 2021-01-03 ENCOUNTER — Encounter: Payer: Self-pay | Admitting: Student in an Organized Health Care Education/Training Program

## 2021-01-05 ENCOUNTER — Telehealth: Payer: Medicare PPO | Admitting: Student in an Organized Health Care Education/Training Program

## 2021-01-05 ENCOUNTER — Other Ambulatory Visit: Payer: Self-pay

## 2021-01-05 ENCOUNTER — Ambulatory Visit (INDEPENDENT_AMBULATORY_CARE_PROVIDER_SITE_OTHER)
Admission: RE | Admit: 2021-01-05 | Discharge: 2021-01-05 | Disposition: A | Discharge status: Home / Self Care | Payer: Medicare PPO | Source: Ambulatory Visit | Attending: Cardiology | Admitting: Cardiology

## 2021-01-05 DIAGNOSIS — I712 Thoracic aortic aneurysm, without rupture: Secondary | ICD-10-CM

## 2021-01-05 DIAGNOSIS — I7121 Aneurysm of the ascending aorta, without rupture: Secondary | ICD-10-CM

## 2021-01-05 MED ORDER — IOHEXOL 350 MG/ML SOLN
100.0000 mL | Freq: Once | INTRAVENOUS | Status: AC | PRN
  Administered 2021-01-05: 100 mL via INTRAVENOUS

## 2021-01-06 ENCOUNTER — Telehealth: Payer: Self-pay | Admitting: *Deleted

## 2021-01-06 DIAGNOSIS — Z0189 Encounter for other specified special examinations: Secondary | ICD-10-CM

## 2021-01-06 DIAGNOSIS — I7121 Aneurysm of the ascending aorta, without rupture: Secondary | ICD-10-CM

## 2021-01-06 DIAGNOSIS — I25119 Atherosclerotic heart disease of native coronary artery with unspecified angina pectoris: Secondary | ICD-10-CM

## 2021-01-06 DIAGNOSIS — E785 Hyperlipidemia, unspecified: Secondary | ICD-10-CM

## 2021-01-06 DIAGNOSIS — I712 Thoracic aortic aneurysm, without rupture: Secondary | ICD-10-CM

## 2021-01-06 NOTE — Telephone Encounter (Signed)
-----   Message from Lars Masson, MD sent at 01/06/2021  8:44 AM EST ----- Stable 3.9 cm ascending aorta, 3.1 cm arch aneurysm without complicating features. Recommend annual imaging followup by CTA or MRA.

## 2021-01-06 NOTE — Telephone Encounter (Signed)
Spoke with the pt and made him aware of his CT Angio Chest Aorta results and recommendations per Dr. Delton See, for him to have this repeated in one year. Informed the pt that I will go ahead and place the order in the system and send a message to our [facility] schedulers to call him back and arrange this test for one year out.  Pt verbalized understanding and agrees with this plan.

## 2021-01-19 ENCOUNTER — Other Ambulatory Visit: Payer: Self-pay

## 2021-01-19 ENCOUNTER — Ambulatory Visit (INDEPENDENT_AMBULATORY_CARE_PROVIDER_SITE_OTHER): Payer: Medicare PPO | Admitting: Pharmacist

## 2021-01-19 DIAGNOSIS — E785 Hyperlipidemia, unspecified: Secondary | ICD-10-CM

## 2021-01-19 MED ORDER — ROSUVASTATIN CALCIUM 5 MG PO TABS: 5.0000 mg | ORAL_TABLET | ORAL | 3 refills | Status: DC

## 2021-01-19 NOTE — Patient Instructions (Signed)
Please start taking rosuvastatin 5mg  three times a week  We will contact you in a few months to set up lab work  Please call me at [phone] with any questions or if you have issues with medication

## 2021-01-19 NOTE — Progress Notes (Signed)
Patient ID: Patrick Moyer                 DOB: 10-Jan-1951                    MRN: [id number]     HPI: Patrick Moyer is a 70 y.o. male patient referred to lipid clinic by Dr. Delton See. PMH is significant for  ascending aortic aneurysm, Marfanoid habitus, CAD, OSA, coronary calcium score 57 (41st percentile). He has not tolerated cholesterol medications and was sent to lipid clinic to discuss.  Patient presents today to lipid clinic. He states that he had muscle pains with rosuvastatin. Took med for 3 months then realized that he was really sore. Stopped taking and felt much better. Had flushing with niacin. And he had arm pain that affected his swimming with zetia. He stopped taking 10 days ago and feels much better. He has [employer]. He is active, swims 5-6 days a week and eats healthy.  Current Medications: none Intolerances: rosuvastatin 5mg  (muscle aches), niacin (flushing), ezetimibe (muscle aches) Risk Factors: coronary calcium score 57 LDL goal: <70  Diet: eggs, , beans, lentils,  Wife is doing [place] and eggs   Exercise: swim 4 to 6 miles per week at the [place] [facility] (mile a day 5-6 days a week)  Family History: The patient's family history includes Aortic aneurysm in his father; Bipolar disorder in his brother; CAD in his father; Glaucoma in his brother; Hypertension in his father; Marfan syndrome in his brother; Obesity in his brother; Osteoarthritis in his sister; Ovarian cancer in his mother.  Social History:   Labs: 12/27/20: TC 174, TG 69, HDL 49, LDL 112, non HDL 25 (niacin)  Past Medical History:  Diagnosis Date  . AAA (abdominal aortic aneurysm) [facility])    cardiologist--- dr [facility]--- CTA 10-22-2019  ascending aorta 57mm and aortic root 37mm  . ALLERGIC RHINITIS   . Arthritis pain of wrist   . Bell's palsy    per pt 1987 left side, 20% residual effect's eyelid  . Benign localized prostatic hyperplasia with lower urinary tract  symptoms (LUTS)   . Chronic bilateral low back pain with bilateral sciatica   . Diverticulosis   . ED (erectile dysfunction)   . Environmental and seasonal allergies   . Family history of Marfan syndrome    father, paternal uncle, and brother  . GERD (gastroesophageal reflux disease)   . Hiatal hernia   . High cholesterol   . History of closed head injury    04-28-2020 per pt yrs ago w/ no loc, residual mild memory loss which has resolved  . 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 33   per pt treated with INH for one year and cxr's since have been normal  . HSV-1 infection   . Marfanoid habitus    per pt has wing span is longer than his height, effects eyes (iris), hyperflexibility of ankle and hands,  AAA  . Mild CAD    cardiologist--  dr 12---  CTA 10-22-2019  . Non-celiac gluten sensitivity   . Numbness and tingling in both hands    per pt intermittantly due to cervical spine  . OSA on CPAP    auto 8-10,  pt stated uses every night  . 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)  . Polyneuropathy    left leg, lumbar nerve   . Pulmonary nodules  per pt followed by pcp (CTA and CT chest 10-22-2019 , stable)  . Radiculopathy of leg   . Wears glasses     Current Outpatient Medications on File Prior to Visit  Medication Sig Dispense Refill  . acetaminophen (TYLENOL) 500 MG tablet Take 500 mg by mouth at bedtime.    Marland Kitchen acyclovir ointment (ZOVIRAX) 5 % Apply 1 application topically every 3 (three) hours.     . Artificial Saliva (BIOTENE DRY MOUTH) LOZG Use as directed in the mouth or throat.    . B Complex Vitamins (B COMPLEX 100 PO) Take by mouth 2 (two) times daily.     . Ca Carbonate-Mag Hydroxide (ROLAIDS PO) Take by mouth as needed.    . celecoxib (CELEBREX) 200 MG capsule Take 200 mg by mouth 2 (two) times daily.    . cholecalciferol (VITAMIN D) 1000 UNITS tablet Take 5,000 Units by mouth daily.    Marland Kitchen ezetimibe (ZETIA) 10 MG tablet Take 1  tablet (10 mg total) by mouth daily. 30 tablet 1  . fexofenadine (ALLEGRA) 180 MG tablet Take 180 mg by mouth daily.    Marland Kitchen FIBER PO Take by mouth daily.    . fluticasone (FLONASE) 50 MCG/ACT nasal spray Place into both nostrils 3 (three) times daily.    . folic acid (FOLVITE) 1 MG tablet Take 1 mg by mouth daily.    Marland Kitchen gabapentin (NEURONTIN) 300 MG capsule 2 tabs with each meal, 3 at bedtime 180 capsule 3  . glucosamine-chondroitin 500-400 MG tablet Take 1 tablet by mouth daily.    Marland Kitchen guaiFENesin (MUCINEX) 600 MG 12 hr tablet Take 1,200 mg by mouth.     . L-Lysine 500 MG CAPS Take 1 capsule by mouth in the morning, at noon, and at bedtime.     . methotrexate (RHEUMATREX) 2.5 MG tablet Take 10 mg by mouth once a week. Caution:Chemotherapy. Protect from light.    . montelukast (SINGULAIR) 10 MG tablet Take 10 mg by mouth at bedtime.     Marland Kitchen nystatin (MYCOSTATIN) 100000 UNIT/ML suspension Take 5 mLs by mouth as needed.     Marland Kitchen omeprazole (PRILOSEC) 20 MG capsule Take 20 mg by mouth daily.    . Potassium Gluconate 550 (90 K) MG TABS Take by mouth daily.    Marland Kitchen VALACYCLOVIR HCL PO Take by mouth.     No current facility-administered medications on file prior to visit.    Allergies  Allergen Reactions  . Other     Adhesive, Caffene-abd pain  . Cialis [Tadalafil]     Caused acute angle glaucoma  . Rosuvastatin Other (See Comments)    Causes joint aches  . Sildenafil     Caused acute angle glaucoma  . Soy Protein [Soybean Oil]     Gastrointestinal upset    Assessment/Plan:  1. Hyperlipidemia - LDL above goal of <70. Discussed LDL goal, benefits of statins and MACE data for alternative agents. Discussed cost and side effects of statins, PCSK9i and Nexletol. Discussed pravastatin vs re challenging with lower dose rosuvastatin (5mg  three times a week). Patient desired to try rosuvastatin 5mg  three times a week. Will reach out and schedule labs in 3 months. Patient to call office if he does not  tolerate.   Thank you,   , Pharm.D, BCPS, CPP [facility]  [address] [place], [address] [place]  Phone: [phone]; Fax: [phone]

## 2021-01-20 ENCOUNTER — Other Ambulatory Visit: Payer: Self-pay | Admitting: Cardiology

## 2021-01-20 DIAGNOSIS — I712 Thoracic aortic aneurysm, without rupture: Secondary | ICD-10-CM

## 2021-01-20 DIAGNOSIS — Z789 Other specified health status: Secondary | ICD-10-CM

## 2021-01-20 DIAGNOSIS — I7121 Aneurysm of the ascending aorta, without rupture: Secondary | ICD-10-CM

## 2021-01-20 DIAGNOSIS — E785 Hyperlipidemia, unspecified: Secondary | ICD-10-CM

## 2021-01-20 DIAGNOSIS — I25119 Atherosclerotic heart disease of native coronary artery with unspecified angina pectoris: Secondary | ICD-10-CM

## 2021-01-20 DIAGNOSIS — Z79899 Other long term (current) drug therapy: Secondary | ICD-10-CM

## 2021-01-24 ENCOUNTER — Telehealth: Payer: Self-pay

## 2021-01-24 DIAGNOSIS — G5702 Lesion of sciatic nerve, left lower limb: Secondary | ICD-10-CM

## 2021-01-24 NOTE — Telephone Encounter (Signed)
Dr. Cherylann Ratel ordered CESI but the pt has changed his mind, and wants piriformis injection can you put in a order for this?

## 2021-01-25 NOTE — Telephone Encounter (Signed)
Orders Placed This Encounter  Procedures  . TRIGGER POINT INJECTION    Area: Buttocks region (gluteal area) Indications: Piriformis muscle pain;  piriformis-syndrome; piriformis muscle spasms (R83.094). CPT code: 07680    Scheduling Instructions:     Type: Myoneural block (TPI) of piriformis muscle.     Sedation: Patient's choice.     Timeframe: Today    Order Specific Question:   Where will this procedure be performed?    Answer:   [facility]

## 2021-02-02 ENCOUNTER — Ambulatory Visit (HOSPITAL_BASED_OUTPATIENT_CLINIC_OR_DEPARTMENT_OTHER): Payer: Medicare PPO | Admitting: Student in an Organized Health Care Education/Training Program

## 2021-02-02 ENCOUNTER — Ambulatory Visit
Admission: RE | Admit: 2021-02-02 | Discharge: 2021-02-02 | Disposition: A | Discharge status: Home / Self Care | Payer: Medicare PPO | Source: Ambulatory Visit | Attending: Student in an Organized Health Care Education/Training Program | Admitting: Student in an Organized Health Care Education/Training Program

## 2021-02-02 ENCOUNTER — Encounter: Payer: Self-pay | Admitting: Student in an Organized Health Care Education/Training Program

## 2021-02-02 ENCOUNTER — Other Ambulatory Visit: Payer: Self-pay

## 2021-02-02 VITALS — BP 141/78 | HR 57 | Temp 97.5°F | Resp 18 | Ht 75.5 in | Wt 196.0 lb

## 2021-02-02 DIAGNOSIS — G894 Chronic pain syndrome: Secondary | ICD-10-CM

## 2021-02-02 DIAGNOSIS — G5702 Lesion of sciatic nerve, left lower limb: Secondary | ICD-10-CM

## 2021-02-02 MED ORDER — LIDOCAINE HCL (PF) 2 % IJ SOLN
INTRAMUSCULAR | Status: AC
  Filled 2021-02-02: qty 5

## 2021-02-02 MED ORDER — IOHEXOL 180 MG/ML  SOLN: 10.0000 mL | Freq: Once | INTRAMUSCULAR | Status: DC

## 2021-02-02 MED ORDER — DEXAMETHASONE SODIUM PHOSPHATE 10 MG/ML IJ SOLN
10.0000 mg | Freq: Once | INTRAMUSCULAR | Status: AC
  Administered 2021-02-02: 10 mg

## 2021-02-02 MED ORDER — DEXAMETHASONE SODIUM PHOSPHATE 10 MG/ML IJ SOLN
INTRAMUSCULAR | Status: AC
  Filled 2021-02-02: qty 1

## 2021-02-02 MED ORDER — LIDOCAINE HCL 2 % IJ SOLN
20.0000 mL | Freq: Once | INTRAMUSCULAR | Status: AC
  Administered 2021-02-02: 400 mg
  Filled 2021-02-02: qty 20

## 2021-02-02 MED ORDER — ROPIVACAINE HCL 2 MG/ML IJ SOLN
INTRAMUSCULAR | Status: AC
  Filled 2021-02-02: qty 10

## 2021-02-02 MED ORDER — ROPIVACAINE HCL 2 MG/ML IJ SOLN
9.0000 mL | Freq: Once | INTRAMUSCULAR | Status: AC
  Administered 2021-02-02: 9 mL via PERINEURAL

## 2021-02-02 NOTE — Progress Notes (Signed)
Safety precautions to be maintained throughout the outpatient stay will include: orient to surroundings, keep bed in low position, maintain call bell within reach at all times, provide assistance with transfer out of bed and ambulation.  

## 2021-02-02 NOTE — Progress Notes (Signed)
PROVIDER NOTE: Information contained herein reflects review and annotations entered in association with encounter. Interpretation of such information and data should be left to medically-trained personnel. Information provided to patient can be located elsewhere in the medical record under "Patient Instructions". Document created using STT-dictation technology, any transcriptional errors that may result from process are unintentional.    Patient: Patrick Moyer  Service Category: Procedure  Provider: Edward Jolly, MD  DOB: 1951-06-02  DOS: 02/02/2021  Location: [facility]  MRN: [id number]  Setting: Ambulatory - outpatient  Referring Provider: Daisy Floro, MD  Type: Established Patient  Specialty: Interventional Pain Management  PCP: Daisy Floro, MD   Primary Reason for Visit: Interventional Pain Management Treatment. CC: Pain (Left sided)  Procedure:          Anesthesia, Analgesia, Anxiolysis:  Type: Left piriformis trigger point injection under fluoroscopic guidance Region: Inferior Lumbosacral Region Level: PIIS (Posterior Inferior Iliac Spine) 1 cm deep, 1 cm lateral and 1 cm inferior from posterior inferior fissure of SI joint. Laterality: Left-Side    Position: Prone           Indications: 1. Piriformis syndrome of left side   2. Chronic pain syndrome    Pain Score: Pre-procedure: 1 /10 Post-procedure: 1 /10   Pre-op H&P Assessment:  Mr. Savard is a 70 y.o. (year old), male patient, seen today for interventional treatment. He  has a past surgical history that includes Nasal septum surgery (1975); Wisdom tooth extraction (1975); Finger exploration (Left, 1995); Laparoscopic appendectomy (2010); Inguinal hernia repair (Left, 1978); and Transurethral resection of prostate (N/A, 05/05/2020). Mr. Teet has a current medication list which includes the following prescription(s): acetaminophen, acyclovir ointment, biotene dry mouth, b complex vitamins, ca  carbonate-mag hydroxide, celecoxib, cholecalciferol, ezetimibe, fexofenadine, fiber, fluticasone, folic acid, gabapentin, glucosamine-chondroitin, guaifenesin, l-lysine, methotrexate, montelukast, nystatin, omeprazole, potassium gluconate, rosuvastatin, and valacyclovir hcl, and the following Facility-Administered Medications: iohexol. His primarily concern today is the Pain (Left sided)  Initial Vital Signs:  Pulse/HCG Rate: (!) 58  Temp: (!) 97.5 F (36.4 C) Resp: 18 BP: 139/80 SpO2: 100 %  BMI: Estimated body mass index is 24.17 kg/m as calculated from the following:   Height as of this encounter: 6' 3.5" (1.918 m).   Weight as of this encounter: 196 lb (88.9 kg).  Risk Assessment: Allergies: Reviewed. He is allergic to other, cialis [tadalafil], rosuvastatin, sildenafil, and soy protein [soybean oil].  Allergy Precautions: None required Coagulopathies: Reviewed. None identified.  Blood-thinner therapy: None at this time Active Infection(s): Reviewed. None identified. Mr. Avalo is afebrile  Site Confirmation: Mr. Laurance was asked to confirm the procedure and laterality before marking the site Procedure checklist: Completed Consent: Before the procedure and under the influence of no sedative(s), amnesic(s), or anxiolytics, the patient was informed of the treatment options, risks and possible complications. To fulfill our ethical and legal obligations, as recommended by the American Medical Association's Code of Ethics, I have informed the patient of my clinical impression; the nature and purpose of the treatment or procedure; the risks, benefits, and possible complications of the intervention; the alternatives, including doing nothing; the risk(s) and benefit(s) of the alternative treatment(s) or procedure(s); and the risk(s) and benefit(s) of doing nothing. The patient was provided information about the general risks and possible complications associated with the procedure. These may  include, but are not limited to: failure to achieve desired goals, infection, bleeding, organ or nerve damage, allergic reactions, paralysis, and death. In addition, the patient  was informed of those risks and complications associated to the procedure, such as failure to decrease pain; infection; bleeding; organ or nerve damage with subsequent damage to sensory, motor, and/or autonomic systems, resulting in permanent pain, numbness, and/or weakness of one or several areas of the body; allergic reactions; (i.e.: anaphylactic reaction); and/or death. Furthermore, the patient was informed of those risks and complications associated with the medications. These include, but are not limited to: allergic reactions (i.e.: anaphylactic or anaphylactoid reaction(s)); adrenal axis suppression; blood sugar elevation that in diabetics may result in ketoacidosis or comma; water retention that in patients with history of congestive heart failure may result in shortness of breath, pulmonary edema, and decompensation with resultant heart failure; weight gain; swelling or edema; medication-induced neural toxicity; particulate matter embolism and blood vessel occlusion with resultant organ, and/or nervous system infarction; and/or aseptic necrosis of one or more joints. Finally, the patient was informed that Medicine is not an exact science; therefore, there is also the possibility of unforeseen or unpredictable risks and/or possible complications that may result in a catastrophic outcome. The patient indicated having understood very clearly. We have given the patient no guarantees and we have made no promises. Enough time was given to the patient to ask questions, all of which were answered to the patient's satisfaction. Mr. Jarmon has indicated that he wanted to continue with the procedure. Attestation: I, the ordering provider, attest that I have discussed with the patient the benefits, risks, side-effects, alternatives,  likelihood of achieving goals, and potential problems during recovery for the procedure that I have provided informed consent. Date  Time: 02/02/2021 12:31 PM  Pre-Procedure Preparation:  Monitoring: As per clinic protocol. Respiration, ETCO2, SpO2, BP, heart rate and rhythm monitor placed and checked for adequate function Safety Precautions: Patient was assessed for positional comfort and pressure points before starting the procedure. Time-out: I initiated and conducted the "Time-out" before starting the procedure, as per protocol. The patient was asked to participate by confirming the accuracy of the "Time Out" information. Verification of the correct person, site, and procedure were performed and confirmed by me, the nursing staff, and the patient. "Time-out" conducted as per Joint Commission's Universal Protocol (UP.01.01.01). Time: 1259  Description of Procedure:          Target Area: 1 cm deep, 1 cm lateral, 1 cm inferior to the Inferior, posterior, aspect of the sacroiliac fissure Approach: Posterior, paraspinal, ipsilateral approach. Area Prepped: Entire Lower Lumbosacral Region DuraPrep (Iodine Povacrylex [0.7% available iodine] and Isopropyl Alcohol, 74% w/w) Safety Precautions: Aspiration looking for blood return was conducted prior to all injections. At no point did we inject any substances, as a needle was being advanced. No attempts were made at seeking any paresthesias. Safe injection practices and needle disposal techniques used. Medications properly checked for expiration dates. SDV (single dose vial) medications used. Description of the Procedure: Protocol guidelines were followed. The patient was placed in position over the procedure table. The target area was identified and the area prepped in the usual manner. Skin & deeper tissues infiltrated with local anesthetic. Appropriate amount of time allowed to pass for local anesthetics to take effect. The procedure needle was advanced  under fluoroscopic guidance into the sacroiliac joint until a firm endpoint was obtained. Proper needle placement secured. Negative aspiration confirmed. Solution injected in intermittent fashion, asking for systemic symptoms every 0.5cc of injectate. The needles were then removed and the area cleansed, making sure to leave some of the prepping solution back to take  advantage of its long term bactericidal properties. Vitals:   02/02/21 1236 02/02/21 1255 02/02/21 1300  BP:  139/80 (!) 141/78  Pulse: (!) 58 (!) 57 (!) 57  Resp: 18 16 18   Temp: (!) 97.5 F (36.4 C)    TempSrc: Oral    SpO2: 100% 100% 100%  Weight: 196 lb (88.9 kg)    Height: 6' 3.5" (1.918 m)      Start Time: 1259 hrs. End Time: 1302 hrs. Materials:  Needle(s) Type: Spinal Needle Gauge: 25G Length: 3.5-in Medication(s): Please see orders for medications and dosing details. 5 cc solution made of 4 cc of 0.2% ropivacaine, 1 cc of Decadron 10 mg/cc.  Injected into the left piriformis muscle after contrast confirmation. Imaging Guidance (Non-Spinal):          Type of Imaging Technique: Fluoroscopy Guidance (Non-Spinal) Indication(s): Assistance in needle guidance and placement for procedures requiring needle placement in or near specific anatomical locations not easily accessible without such assistance. Exposure Time: Please see nurses notes. Contrast: Before injecting any contrast, we confirmed that the patient did not have an allergy to iodine, shellfish, or radiological contrast. Once satisfactory needle placement was completed at the desired level, radiological contrast was injected. Contrast injected under live fluoroscopy. No contrast complications. See chart for type and volume of contrast used. Fluoroscopic Guidance: I was personally present during the use of fluoroscopy. "Tunnel Vision Technique" used to obtain the best possible view of the target area. Parallax error corrected before commencing the procedure.  "Direction-depth-direction" technique used to introduce the needle under continuous pulsed fluoroscopy. Once target was reached, antero-posterior, oblique, and lateral fluoroscopic projection used confirm needle placement in all planes. Images permanently stored in EMR. Interpretation: I personally interpreted the imaging intraoperatively. Adequate needle placement confirmed in multiple planes. Appropriate spread of contrast into desired area was observed. No evidence of afferent or efferent intravascular uptake. Permanent images saved into the patient's record.    Post-operative Assessment:  Post-procedure Vital Signs:  Pulse/HCG Rate: (!) 57  Temp: (!) 97.5 F (36.4 C) Resp: 18 BP: (!) 141/78 SpO2: 100 %  EBL: None  Complications: No immediate post-treatment complications observed by team, or reported by patient.  Note: The patient tolerated the entire procedure well. A repeat set of vitals were taken after the procedure and the patient was kept under observation following institutional policy, for this type of procedure. Post-procedural neurological assessment was performed, showing return to baseline, prior to discharge. The patient was provided with post-procedure discharge instructions, including a section on how to identify potential problems. Should any problems arise concerning this procedure, the patient was given instructions to immediately contact , at any time, without hesitation. In any case, we plan to contact the patient by telephone for a follow-up status report regarding this interventional procedure.  Comments:  No additional relevant information.  Plan of Care  Orders:  Orders Placed This Encounter  Procedures  . DG PAIN CLINIC C-ARM 1-60 MIN NO REPORT    Intraoperative interpretation by procedural physician at [facility].    Standing Status:   Standing    Number of Occurrences:   1    Order Specific Question:   Reason for exam:    Answer:   Assistance  in needle guidance and placement for procedures requiring needle placement in or near specific anatomical locations not easily accessible without such assistance.   Medications ordered for procedure: Meds ordered this encounter  Medications  . lidocaine (XYLOCAINE) 2 % (with pres)  injection 400 mg  . dexamethasone (DECADRON) injection 10 mg  . ropivacaine (PF) 2 mg/mL (0.2%) (NAROPIN) injection 9 mL  . iohexol (OMNIPAQUE) 180 MG/ML injection 10 mL    Must be Myelogram-compatible. If not available, you may substitute with a water-soluble, non-ionic, hypoallergenic, myelogram-compatible radiological contrast medium.   Medications administered: We administered lidocaine, dexamethasone, and ropivacaine (PF) 2 mg/mL (0.2%).  See the medical record for exact dosing, route, and time of administration.  Follow-up plan:   No follow-ups on file.      LEFT L4/5 ESI 11/24/20: Not effective.  Left piriformis TPI 02/02/2021     Recent Visits Date Type Provider Dept  12/29/20 Telemedicine Edward Jolly, MD [facility]  11/24/20 Procedure visit Edward Jolly, MD [facility]  Showing recent visits within past 90 days and meeting all other requirements Today's Visits Date Type Provider Dept  02/02/21 Procedure visit Edward Jolly, MD [facility]  Showing today's visits and meeting all other requirements Future Appointments Date Type Provider Dept  03/02/21 Appointment Edward Jolly, MD [facility]  Showing future appointments within next 90 days and meeting all other requirements  Disposition: Discharge home  Discharge (Date  Time): 02/02/2021; 1308 hrs.   Primary Care Physician: Daisy Floro, MD Location: [facility] Note by: Edward Jolly, MD Date: 02/02/2021; Time: 1:43 PM  Disclaimer:  Medicine is not an exact science. The only guarantee in medicine is that nothing is guaranteed. It is important to note that the decision  to proceed with this intervention was based on the information collected from the patient. The Data and conclusions were drawn from the patient's questionnaire, the interview, and the physical examination. Because the information was provided in large part by the patient, it cannot be guaranteed that it has not been purposely or unconsciously manipulated. Every effort has been made to obtain as much relevant data as possible for this evaluation. It is important to note that the conclusions that lead to this procedure are derived in large part from the available data. Always take into account that the treatment will also be dependent on availability of resources and existing treatment guidelines, considered by other Pain Management Practitioners as being common knowledge and practice, at the time of the intervention. For Medico-Legal purposes, it is also important to point out that variation in procedural techniques and pharmacological choices are the acceptable norm. The indications, contraindications, technique, and results of the above procedure should only be interpreted and judged by a Board-Certified Interventional Pain Specialist with extensive familiarity and expertise in the same exact procedure and technique.

## 2021-02-03 ENCOUNTER — Telehealth: Payer: Self-pay | Admitting: *Deleted

## 2021-02-03 NOTE — Telephone Encounter (Signed)
Spoke with patient re; procedure on yesterday, no questions or concerns.  

## 2021-03-01 ENCOUNTER — Encounter: Payer: Self-pay | Admitting: Student in an Organized Health Care Education/Training Program

## 2021-03-02 ENCOUNTER — Other Ambulatory Visit: Payer: Self-pay

## 2021-03-02 ENCOUNTER — Ambulatory Visit
Payer: Medicare PPO | Attending: Student in an Organized Health Care Education/Training Program | Admitting: Student in an Organized Health Care Education/Training Program

## 2021-03-02 ENCOUNTER — Encounter: Payer: Self-pay | Admitting: Student in an Organized Health Care Education/Training Program

## 2021-03-02 DIAGNOSIS — G894 Chronic pain syndrome: Secondary | ICD-10-CM

## 2021-03-02 DIAGNOSIS — M5412 Radiculopathy, cervical region: Secondary | ICD-10-CM

## 2021-03-02 DIAGNOSIS — M47818 Spondylosis without myelopathy or radiculopathy, sacral and sacrococcygeal region: Secondary | ICD-10-CM | POA: Diagnosis not present

## 2021-03-02 DIAGNOSIS — M5416 Radiculopathy, lumbar region: Secondary | ICD-10-CM | POA: Diagnosis not present

## 2021-03-02 DIAGNOSIS — M4802 Spinal stenosis, cervical region: Secondary | ICD-10-CM

## 2021-03-02 DIAGNOSIS — G5702 Lesion of sciatic nerve, left lower limb: Secondary | ICD-10-CM | POA: Diagnosis not present

## 2021-03-02 DIAGNOSIS — G8929 Other chronic pain: Secondary | ICD-10-CM

## 2021-03-02 NOTE — Progress Notes (Signed)
Patient: Darrol Jump  Service Category: E/M  Provider: Gillis Santa, MD  DOB: 1951/03/21  DOS: 03/02/2021  Location: Office  MRN: [id number]  Setting: Ambulatory outpatient  Referring Provider: Lawerance Cruel, MD  Type: Established Patient  Specialty: Interventional Pain Management  PCP: Lawerance Cruel, MD  Location: Home  Delivery: TeleHealth     Virtual Encounter - Pain Management PROVIDER NOTE: Information contained herein reflects review and annotations entered in association with encounter. Interpretation of such information and data should be left to medically-trained personnel. Information provided to patient can be located elsewhere in the medical record under "Patient Instructions". Document created using STT-dictation technology, any transcriptional errors that may result from process are unintentional.    Contact & Pharmacy Preferred: [phone] Home: [phone] (home) Mobile: [phone] (mobile) E-mail: [email or web address]  [facility] [address], [place] [address] Phone: [phone] Fax: [phone]  [id number] [place]: [phone]: [phone]  Pre-screening  Mr. Frankland offered "in-person" vs "virtual" encounter. He indicated preferring virtual for this encounter.   Reason COVID-19*  Social distancing based on CDC and AMA recommendations.   I contacted [name] 03/02/2021 via telephone.      I clearly identified myself as [name]. I verified that I was speaking with the correct person using two identifiers (Name: ELENNIS KORB and date of birth: 731-Dec-1952.  Consent I sought verbal advanced consent from EDarrol Jumpfor virtual visit interactions. I informed Mr. WPetrosianof possible security and privacy concerns, risks, and limitations associated with providing "not-in-person"  medical evaluation and management services. I also informed Mr. WCorniaof the availability of "in-person" appointments. Finally, I informed him that there would be a charge for the virtual visit and that he could be  personally, fully or partially, financially responsible for it. Mr. WGrudzienexpressed understanding and agreed to proceed.   Historic Elements   Mr. EALDEAN PIPEis a 70y.o. year old, male patient evaluated today after our last contact on 02/02/2021. Mr. 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 He also  has a past surgical history that includes Nasal septum surgery (1975); Wisdom tooth extraction (1975); Finger exploration (Left, 1995); Laparoscopic appendectomy (2010); Inguinal hernia repair (Left, 1978); and Transurethral resection of prostate (N/A, 05/05/2020). Mr. WSomerahas a current medication list which includes the following prescription(s): acetaminophen, acyclovir ointment, biotene dry mouth, b complex vitamins, ca carbonate-mag hydroxide, celecoxib, cholecalciferol, ezetimibe, fexofenadine, fiber, fluticasone, folic acid, gabapentin, glucosamine-chondroitin, guaifenesin, l-lysine, methotrexate, montelukast, nystatin, omeprazole, potassium gluconate, valacyclovir hcl, and rosuvastatin.  He  reports that he has never smoked. He has never used smokeless tobacco. He reports that he does not drink alcohol and does not use drugs. Mr. WRaccais allergic to other, cialis [tadalafil], rosuvastatin, sildenafil, and soy protein [soybean oil].   HPI  Today, he is being contacted for a post-procedure assessment.   Post-Procedure Evaluation  Procedure (02/02/2021): Left piriformis TPI  Sedation: Please see nurses note.  Effectiveness during initial hour after procedure(Ultra-Short Term Relief): 100 %   Local anesthetic used: Long-acting (4-6 hours) Effectiveness: Defined as any analgesic benefit obtained secondary to the administration of local anesthetics. This carries significant diagnostic value as to the etiological location, or anatomical origin, of the pain. Duration of benefit is expected to coincide with the duration of the local anesthetic used.  Effectiveness during initial 4-6 hours after procedure(Short-Term Relief): 90 %   Long-term benefit: Defined as any relief past the pharmacologic duration of the local anesthetics.  Effectiveness past the initial 6 hours after procedure(Long-Term Relief): 100% for 30 hours  Current benefits: Defined as benefit that persist at this time.   Analgesia:  Back to baseline Function: Back to baseline ROM: Back to baseline   Laboratory Chemistry Profile   Renal Lab Results  Component Value Date   BUN 11 12/27/2020   CREATININE 0.70 (L) 12/27/2020   BCR 16 12/27/2020   GFRAA 112 12/27/2020   GFRNONAA 97 12/27/2020     Hepatic Lab Results  Component Value Date   AST 18 12/27/2020   ALT 14 12/27/2020   ALBUMIN 4.3 12/27/2020   ALKPHOS 65 12/27/2020     Electrolytes Lab Results  Component Value Date   NA 142 12/27/2020   K 4.1 12/27/2020   CL 107 (H) 12/27/2020   CALCIUM 9.1 12/27/2020     Bone No results found for: VD25OH, VD125OH2TOT, IW5809XI3, JA2505LZ7, 25OHVITD1, 25OHVITD2, 25OHVITD3, TESTOFREE, TESTOSTERONE    Inflammation (CRP: Acute Phase) (ESR: Chronic Phase) No results found for: CRP, ESRSEDRATE, LATICACIDVEN     Note: Above Lab results reviewed.   Assessment  The primary encounter diagnosis was Piriformis syndrome of left side. Diagnoses of Foraminal stenosis of cervical region, SI joint arthritis, Chronic radicular lumbar pain, Cervical radicular pain, and Chronic pain syndrome were also pertinent to this visit.  Plan of Care   Mr. Darrol Jump has a current medication list which includes the following long-term medication(s): ezetimibe, fexofenadine, gabapentin, omeprazole, potassium gluconate, and rosuvastatin.  Discussed repeating left piriformis TPI #2, adding additional volume removal with next procedure to see if that prolongs duration of pain relief.  Patient states that after his piriformis injection, he was able to walk for about 20 minutes with almost no pain which she has not done for many years.  He states that he had pretty significant pain relief overlying his SI joint, hip and buttock region for approximately 30 hours during which she was much more functional after which his pain returned.  Orders:  Orders Placed This Encounter  Procedures  . TRIGGER POINT INJECTION    Area: Buttocks region (gluteal area) Indications: Piriformis muscle pain; Left (G57.02) piriformis-syndrome; piriformis muscle spasms (Q73.419). CPT code: 20552    Standing Status:   Future    Standing Expiration Date:   07/02/2021    Scheduling Instructions:     Type: Myoneural block (TPI) of piriformis muscle.     Side:  LEFT     Sedation: Patient's choice.    Order Specific Question:   Where will this procedure be performed?    Answer:   [facility]   Follow-up plan:   Return in about 1 week (around 03/09/2021) for Left piriformis TPI, without sedation.     LEFT L4/5 ESI 11/24/20: Not effective.  Left piriformis TPI 02/02/2021 100%  pain relief for 30 hours, repeat      Recent Visits Date Type  Provider Dept  02/02/21 Procedure visit Gillis Santa, MD [facility]  12/29/20 Telemedicine Gillis Santa, MD [facility]  Showing recent visits within past 90 days and meeting all other requirements Today's Visits Date Type Provider Dept  03/02/21 Telemedicine Gillis Santa, MD [facility]  Showing today's visits and meeting all other requirements Future Appointments No visits were found meeting these conditions. Showing future appointments within next 90 days and meeting all other requirements  I discussed the assessment and treatment plan with the patient. The patient was provided an opportunity to ask questions and all were answered. The patient agreed with the plan and demonstrated an understanding of the instructions.  Patient advised to call back or seek an in-person evaluation if the symptoms or condition worsens.  Duration of encounter: 6mnutes.  Note by: [name] Date: 03/02/2021; Time: 3:09 PM

## 2021-03-10 ENCOUNTER — Other Ambulatory Visit: Payer: Self-pay | Admitting: Student in an Organized Health Care Education/Training Program

## 2021-03-14 ENCOUNTER — Ambulatory Visit
Admission: RE | Admit: 2021-03-14 | Discharge: 2021-03-14 | Disposition: A | Discharge status: Home / Self Care | Payer: Medicare PPO | Source: Ambulatory Visit | Attending: Student in an Organized Health Care Education/Training Program | Admitting: Student in an Organized Health Care Education/Training Program

## 2021-03-14 ENCOUNTER — Encounter: Payer: Self-pay | Admitting: Student in an Organized Health Care Education/Training Program

## 2021-03-14 ENCOUNTER — Ambulatory Visit (HOSPITAL_BASED_OUTPATIENT_CLINIC_OR_DEPARTMENT_OTHER): Payer: Medicare PPO | Admitting: Student in an Organized Health Care Education/Training Program

## 2021-03-14 ENCOUNTER — Other Ambulatory Visit: Payer: Self-pay

## 2021-03-14 DIAGNOSIS — G894 Chronic pain syndrome: Secondary | ICD-10-CM | POA: Diagnosis present

## 2021-03-14 DIAGNOSIS — G5702 Lesion of sciatic nerve, left lower limb: Secondary | ICD-10-CM | POA: Diagnosis present

## 2021-03-14 MED ORDER — IOHEXOL 180 MG/ML  SOLN
10.0000 mL | Freq: Once | INTRAMUSCULAR | Status: AC
  Administered 2021-03-14: 10 mL via INTRA_ARTICULAR

## 2021-03-14 MED ORDER — GABAPENTIN 300 MG PO CAPS: 900.0000 mg | ORAL_CAPSULE | Freq: Three times a day (TID) | ORAL | 2 refills | Status: DC

## 2021-03-14 MED ORDER — DEXAMETHASONE SODIUM PHOSPHATE 10 MG/ML IJ SOLN
10.0000 mg | Freq: Once | INTRAMUSCULAR | Status: AC
  Administered 2021-03-14: 10 mg
  Filled 2021-03-14: qty 1

## 2021-03-14 MED ORDER — LIDOCAINE HCL 2 % IJ SOLN
20.0000 mL | Freq: Once | INTRAMUSCULAR | Status: AC
  Administered 2021-03-14: 200 mg
  Filled 2021-03-14: qty 20

## 2021-03-14 MED ORDER — ROPIVACAINE HCL 2 MG/ML IJ SOLN
4.0000 mL | Freq: Once | INTRAMUSCULAR | Status: AC
  Administered 2021-03-14: 4 mL via INTRA_ARTICULAR
  Filled 2021-03-14: qty 10

## 2021-03-14 NOTE — Progress Notes (Addendum)
PROVIDER NOTE: Information contained herein reflects review and annotations entered in association with encounter. Interpretation of such information and data should be left to medically-trained personnel. Information provided to patient can be located elsewhere in the medical record under "Patient Instructions". Document created using STT-dictation technology, any transcriptional errors that may result from process are unintentional.    Patient: Patrick Moyer  Service Category: Procedure  Provider: Edward Jolly, MD  DOB: 1951-11-09  DOS: 03/14/2021  Location: [facility]  MRN: [id number]  Setting: Ambulatory - outpatient  Referring Provider: Edward Jolly, MD  Type: Established Patient  Specialty: Interventional Pain Management  PCP: Daisy Floro, MD   Primary Reason for Visit: Interventional Pain Management Treatment. CC: left pyriformis and Ankle Pain (Left )  Procedure:          Anesthesia, Analgesia, Anxiolysis:  Type: Left piriformis trigger point injection #2 under fluoroscopic guidance Region: Inferior Lumbosacral Region Level: PIIS (Posterior Inferior Iliac Spine) 1 cm deep, 1 cm lateral and 1 cm inferior from posterior inferior fissure of SI joint. Laterality: Left-Side    Position: Prone           Indications: 1. Piriformis syndrome of left side   2. Chronic pain syndrome    Pain Score: Pre-procedure: 2 /10 Post-procedure: 2  (moving around)/10   Pre-op H&P Assessment:  Patrick Moyer is a 70 y.o. (year old), male patient, seen today for interventional treatment. He  has a past surgical history that includes Nasal septum surgery (1975); Wisdom tooth extraction (1975); Finger exploration (Left, 1995); Laparoscopic appendectomy (2010); Inguinal hernia repair (Left, 1978); and Transurethral resection of prostate (N/A, 05/05/2020). Patrick Moyer has a current medication list which includes the following prescription(s): acetaminophen, acyclovir ointment, biotene dry  mouth, b complex vitamins, ca carbonate-mag hydroxide, celecoxib, cholecalciferol, ezetimibe, fexofenadine, fiber, fluticasone, folic acid, glucosamine-chondroitin, guaifenesin, l-lysine, methotrexate, montelukast, nystatin, omeprazole, potassium gluconate, rosuvastatin, valacyclovir hcl, and gabapentin. His primarily concern today is the left pyriformis and Ankle Pain (Left )  Initial Vital Signs:  Pulse/HCG Rate: 62  Temp: (!) 97.2 F (36.2 C) Resp: 16 BP: 133/68 SpO2: 98 %  BMI: Estimated body mass index is 24.36 kg/m as calculated from the following:   Height as of this encounter: 6' 3.5" (1.918 m).   Weight as of this encounter: 197 lb 8 oz (89.6 kg).  Risk Assessment: Allergies: Reviewed. He is allergic to other, cialis [tadalafil], rosuvastatin, sildenafil, and soy protein [soybean oil].  Allergy Precautions: None required Coagulopathies: Reviewed. None identified.  Blood-thinner therapy: None at this time Active Infection(s): Reviewed. None identified. Patrick Moyer is afebrile  Site Confirmation: Patrick Moyer was asked to confirm the procedure and laterality before marking the site Procedure checklist: Completed Consent: Before the procedure and under the influence of no sedative(s), amnesic(s), or anxiolytics, the patient was informed of the treatment options, risks and possible complications. To fulfill our ethical and legal obligations, as recommended by the American Medical Association's Code of Ethics, I have informed the patient of my clinical impression; the nature and purpose of the treatment or procedure; the risks, benefits, and possible complications of the intervention; the alternatives, including doing nothing; the risk(s) and benefit(s) of the alternative treatment(s) or procedure(s); and the risk(s) and benefit(s) of doing nothing. The patient was provided information about the general risks and possible complications associated with the procedure. These may include, but  are not limited to: failure to achieve desired goals, infection, bleeding, organ or nerve damage, allergic reactions, paralysis, and  death. In addition, the patient was informed of those risks and complications associated to the procedure, such as failure to decrease pain; infection; bleeding; organ or nerve damage with subsequent damage to sensory, motor, and/or autonomic systems, resulting in permanent pain, numbness, and/or weakness of one or several areas of the body; allergic reactions; (i.e.: anaphylactic reaction); and/or death. Furthermore, the patient was informed of those risks and complications associated with the medications. These include, but are not limited to: allergic reactions (i.e.: anaphylactic or anaphylactoid reaction(s)); adrenal axis suppression; blood sugar elevation that in diabetics may result in ketoacidosis or comma; water retention that in patients with history of congestive heart failure may result in shortness of breath, pulmonary edema, and decompensation with resultant heart failure; weight gain; swelling or edema; medication-induced neural toxicity; particulate matter embolism and blood vessel occlusion with resultant organ, and/or nervous system infarction; and/or aseptic necrosis of one or more joints. Finally, the patient was informed that Medicine is not an exact science; therefore, there is also the possibility of unforeseen or unpredictable risks and/or possible complications that may result in a catastrophic outcome. The patient indicated having understood very clearly. We have given the patient no guarantees and we have made no promises. Enough time was given to the patient to ask questions, all of which were answered to the patient's satisfaction. Patrick Moyer has indicated that he wanted to continue with the procedure. Attestation: I, the ordering provider, attest that I have discussed with the patient the benefits, risks, side-effects, alternatives, likelihood of  achieving goals, and potential problems during recovery for the procedure that I have provided informed consent. Date  Time: 03/14/2021  9:36 AM  Pre-Procedure Preparation:  Monitoring: As per clinic protocol. Respiration, ETCO2, SpO2, BP, heart rate and rhythm monitor placed and checked for adequate function Safety Precautions: Patient was assessed for positional comfort and pressure points before starting the procedure. Time-out: I initiated and conducted the "Time-out" before starting the procedure, as per protocol. The patient was asked to participate by confirming the accuracy of the "Time Out" information. Verification of the correct person, site, and procedure were performed and confirmed by me, the nursing staff, and the patient. "Time-out" conducted as per Joint Commission's Universal Protocol (UP.01.01.01). Time: 1025  Description of Procedure:          Target Area: 1 cm deep, 1 cm lateral, 1 cm inferior to the Inferior, posterior, aspect of the sacroiliac fissure Approach: Posterior, paraspinal, ipsilateral approach. Area Prepped: Entire Lower Lumbosacral Region DuraPrep (Iodine Povacrylex [0.7% available iodine] and Isopropyl Alcohol, 74% w/w) Safety Precautions: Aspiration looking for blood return was conducted prior to all injections. At no point did we inject any substances, as a needle was being advanced. No attempts were made at seeking any paresthesias. Safe injection practices and needle disposal techniques used. Medications properly checked for expiration dates. SDV (single dose vial) medications used. Description of the Procedure: Protocol guidelines were followed. The patient was placed in position over the procedure table. The target area was identified and the area prepped in the usual manner. Skin & deeper tissues infiltrated with local anesthetic. Appropriate amount of time allowed to pass for local anesthetics to take effect. The procedure needle was advanced under  fluoroscopic guidance into the sacroiliac joint until a firm endpoint was obtained. Proper needle placement secured. Negative aspiration confirmed. Solution injected in intermittent fashion, asking for systemic symptoms every 0.5cc of injectate. The needles were then removed and the area cleansed, making sure to leave some of  the prepping solution back to take advantage of its long term bactericidal properties. Vitals:   03/14/21 0950 03/14/21 1025 03/14/21 1031  BP: 133/68 136/80 139/79  Pulse: 62 (!) 58 (!) 57  Resp: 16 18 16   Temp: (!) 97.2 F (36.2 C)    TempSrc: Temporal    SpO2: 98% 97% 98%  Weight: 197 lb 8 oz (89.6 kg)    Height: 6' 3.5" (1.918 m)      Start Time: 1025 hrs. End Time: 1029 hrs. Materials:  Needle(s) Type: Spinal Needle Gauge: 25G Length: 3.5-in Medication(s): Please see orders for medications and dosing details.  5 cc solution made of 4 cc of 0.2% ropivacaine, 1 cc of Decadron 10 mg/cc.  Injected into the left piriformis muscle after contrast confirmation. Imaging Guidance (Non-Spinal):          Type of Imaging Technique: Fluoroscopy Guidance (Non-Spinal) Indication(s): Assistance in needle guidance and placement for procedures requiring needle placement in or near specific anatomical locations not easily accessible without such assistance. Exposure Time: Please see nurses notes. Contrast: Before injecting any contrast, we confirmed that the patient did not have an allergy to iodine, shellfish, or radiological contrast. Once satisfactory needle placement was completed at the desired level, radiological contrast was injected. Contrast injected under live fluoroscopy. No contrast complications. See chart for type and volume of contrast used. Fluoroscopic Guidance: I was personally present during the use of fluoroscopy. "Tunnel Vision Technique" used to obtain the best possible view of the target area. Parallax error corrected before commencing the procedure.  "Direction-depth-direction" technique used to introduce the needle under continuous pulsed fluoroscopy. Once target was reached, antero-posterior, oblique, and lateral fluoroscopic projection used confirm needle placement in all planes. Images permanently stored in EMR. Interpretation: I personally interpreted the imaging intraoperatively. Adequate needle placement confirmed in multiple planes. Appropriate spread of contrast into desired area was observed. No evidence of afferent or efferent intravascular uptake. Permanent images saved into the patient's record.  Post-operative Assessment:  Post-procedure Vital Signs:  Pulse/HCG Rate: (!) 57  Temp: (!) 97.2 F (36.2 C) Resp: 16 BP: 139/79 SpO2: 98 %  EBL: None  Complications: No immediate post-treatment complications observed by team, or reported by patient.  Note: The patient tolerated the entire procedure well. A repeat set of vitals were taken after the procedure and the patient was kept under observation following institutional policy, for this type of procedure. Post-procedural neurological assessment was performed, showing return to baseline, prior to discharge. The patient was provided with post-procedure discharge instructions, including a section on how to identify potential problems. Should any problems arise concerning this procedure, the patient was given instructions to immediately contact us, at any time, without hesitation. In any case, we plan to contact the patient by telephone for a follow-up status report regarding this interventional procedure.  Comments:  No additional relevant information.  Plan of Care  Orders:  Orders Placed This Encounter  Procedures  . DG PAIN CLINIC C-ARM 1-60 MIN NO REPORT    Intraoperative interpretation by procedural physician at [facility].    Standing Status:   Standing    Number of Occurrences:   1    Order Specific Question:   Reason for exam:    Answer:   Assistance in  needle guidance and placement for procedures requiring needle placement in or near specific anatomical locations not easily accessible without such assistance.   Medications ordered for procedure: Meds ordered this encounter  Medications  . iohexol (OMNIPAQUE)  180 MG/ML injection 10 mL    Must be Myelogram-compatible. If not available, you may substitute with a water-soluble, non-ionic, hypoallergenic, myelogram-compatible radiological contrast medium.  Marland Kitchen lidocaine (XYLOCAINE) 2 % (with pres) injection 400 mg  . ropivacaine (PF) 2 mg/mL (0.2%) (NAROPIN) injection 4 mL  . dexamethasone (DECADRON) injection 10 mg  . gabapentin (NEURONTIN) 300 MG capsule    Sig: Take 3 capsules (900 mg total) by mouth 3 (three) times daily.    Dispense:  270 capsule    Refill:  2   Medications administered: We administered iohexol, lidocaine, ropivacaine (PF) 2 mg/mL (0.2%), and dexamethasone.  See the medical record for exact dosing, route, and time of administration.  Follow-up plan:   Return in about 4 weeks (around 04/11/2021) for Post Procedure Evaluation, virtual.      LEFT L4/5 ESI 11/24/20: Not effective.  Left piriformis TPI #1 02/02/2021 (100% pain relief for 2 days),  #2 03/14/21     Recent Visits Date Type Provider Dept  03/02/21 Telemedicine Edward Jolly, MD [facility]  02/02/21 Procedure visit Edward Jolly, MD [facility]  12/29/20 Telemedicine Edward Jolly, MD [facility]  Showing recent visits within past 90 days and meeting all other requirements Today's Visits Date Type Provider Dept  03/14/21 Procedure visit Edward Jolly, MD [facility]  Showing today's visits and meeting all other requirements Future Appointments Date Type Provider Dept  04/06/21 Appointment Edward Jolly, MD [facility]  Showing future appointments within next 90 days and meeting all other requirements  Disposition: Discharge home  Discharge (Date  Time):  03/14/2021;   hrs.   Primary Care Physician: Daisy Floro, MD Location: [facility] Note by: Edward Jolly, MD Date: 03/14/2021; Time: 1:00 PM  Disclaimer:  Medicine is not an exact science. The only guarantee in medicine is that nothing is guaranteed. It is important to note that the decision to proceed with this intervention was based on the information collected from the patient. The Data and conclusions were drawn from the patient's questionnaire, the interview, and the physical examination. Because the information was provided in large part by the patient, it cannot be guaranteed that it has not been purposely or unconsciously manipulated. Every effort has been made to obtain as much relevant data as possible for this evaluation. It is important to note that the conclusions that lead to this procedure are derived in large part from the available data. Always take into account that the treatment will also be dependent on availability of resources and existing treatment guidelines, considered by other Pain Management Practitioners as being common knowledge and practice, at the time of the intervention. For Medico-Legal purposes, it is also important to point out that variation in procedural techniques and pharmacological choices are the acceptable norm. The indications, contraindications, technique, and results of the above procedure should only be interpreted and judged by a Board-Certified Interventional Pain Specialist with extensive familiarity and expertise in the same exact procedure and technique.

## 2021-03-14 NOTE — Patient Instructions (Signed)

## 2021-03-14 NOTE — Progress Notes (Signed)
Safety precautions to be maintained throughout the outpatient stay will include: orient to surroundings, keep bed in low position, maintain call bell within reach at all times, provide assistance with transfer out of bed and ambulation.  

## 2021-03-14 NOTE — Addendum Note (Signed)
Addended by: Edward Jolly on: 03/14/2021 01:00 PM   Modules accepted: Orders

## 2021-03-15 ENCOUNTER — Telehealth: Payer: Self-pay | Admitting: *Deleted

## 2021-03-15 NOTE — Telephone Encounter (Signed)
Spoke with patient re; procedure, no questions or concerns.  

## 2021-04-06 ENCOUNTER — Other Ambulatory Visit: Payer: Self-pay

## 2021-04-06 ENCOUNTER — Ambulatory Visit
Payer: Medicare PPO | Attending: Student in an Organized Health Care Education/Training Program | Admitting: Student in an Organized Health Care Education/Training Program

## 2021-04-06 DIAGNOSIS — M47818 Spondylosis without myelopathy or radiculopathy, sacral and sacrococcygeal region: Secondary | ICD-10-CM

## 2021-04-06 DIAGNOSIS — G894 Chronic pain syndrome: Secondary | ICD-10-CM | POA: Diagnosis not present

## 2021-04-06 DIAGNOSIS — G5702 Lesion of sciatic nerve, left lower limb: Secondary | ICD-10-CM

## 2021-04-06 NOTE — Progress Notes (Signed)
Patient: Patrick Moyer  Service Category: E/M  Provider: Gillis Santa, MD  DOB: August 10, 1951  DOS: 04/06/2021  Location: Office  MRN: [id number]  Setting: Ambulatory outpatient  Referring Provider: Lawerance Cruel, MD  Type: Established Patient  Specialty: Interventional Pain Management  PCP: Lawerance Cruel, MD  Location: Home  Delivery: TeleHealth     Virtual Encounter - Pain Management PROVIDER NOTE: Information contained herein reflects review and annotations entered in association with encounter. Interpretation of such information and data should be left to medically-trained personnel. Information provided to patient can be located elsewhere in the medical record under "Patient Instructions". Document created using STT-dictation technology, any transcriptional errors that may result from process are unintentional.    Contact & Pharmacy Preferred: [phone] Home: [phone] (home) Mobile: [phone] (mobile) E-mail: [email or web address]  [id number], [place] [address] Phone: [phone] Fax: [phone]  [id number] [address] [place] [address] Phone: [phone] Fax: [phone]   Pre-screening  Mr. Trigg offered "in-person" vs "virtual" encounter. He indicated preferring virtual for this encounter.   Reason COVID-19*  Social distancing based on CDC and AMA recommendations.   I contacted Patrick Moyer on 04/06/2021 via video conference.      I clearly identified myself as Gillis Santa, MD. I verified that I was speaking with the correct person using two identifiers (Name: BOBAK OGUINN, and date of birth: 05/09/1951).  Consent I sought verbal advanced consent from Patrick Moyer for virtual visit interactions. I informed Mr. Yielding of possible security and privacy concerns, risks, and limitations associated with providing  "not-in-person" medical evaluation and management services. I also informed Mr. Calderwood of the availability of "in-person" appointments. Finally, I informed him that there would be a charge for the virtual visit and that he could be  personally, fully or partially, financially responsible for it. Mr. Pokorny expressed understanding and agreed to proceed.   Historic Elements   Mr. MEAGAN SPEASE is a 70 y.o. year old, male patient evaluated today after our last contact on 03/14/2021. Mr. Reine  has a past medical history of AAA (abdominal aortic aneurysm) ([place]), ALLERGIC RHINITIS, Arthritis pain of wrist, Bell's palsy, Benign localized prostatic hyperplasia with lower urinary tract symptoms (LUTS), Chronic bilateral low back pain with bilateral sciatica, Diverticulosis, ED (erectile dysfunction), Environmental and seasonal allergies, Family history of Marfan syndrome, GERD (gastroesophageal reflux disease), Hiatal hernia, High cholesterol, History of closed head injury, History of positive PPD (age 38), HSV-1 infection, Marfanoid habitus, Mild CAD, Non-celiac gluten sensitivity, Numbness and tingling in both hands, OSA on CPAP, Pigment dispersion syndrome, Polyneuropathy, Pulmonary nodules, Radiculopathy of leg, and Wears glasses. He also  has a past surgical history that includes Nasal septum surgery (1975); Wisdom tooth extraction (1975); Finger exploration (Left, 1995); Laparoscopic appendectomy (2010); Inguinal hernia repair (Left, 1978); and Transurethral resection of prostate (N/A, 05/05/2020). Mr. Linhardt has a current medication list which includes the following prescription(s): acetaminophen, acyclovir ointment, biotene dry mouth, b complex vitamins, ca carbonate-mag hydroxide, celecoxib, cholecalciferol, ezetimibe, fexofenadine, fiber, fluticasone, folic acid, gabapentin, glucosamine-chondroitin, guaifenesin, l-lysine, methotrexate, montelukast, nystatin, omeprazole, potassium gluconate, valacyclovir hcl,  and rosuvastatin. He  reports that he has never smoked. He has never used smokeless tobacco. He reports that he does not drink alcohol and does not use drugs. Mr. Sitzer is allergic to other, cialis [tadalafil], rosuvastatin, sildenafil, and soy protein [soybean oil].   HPI  Today, he is being contacted for a post-procedure assessment.   Post-Procedure Evaluation  Procedure (03/14/2021):   Type: Left piriformis trigger point injection #2 under fluoroscopic guidance Region: Inferior Lumbosacral Region Level: PIIS (Posterior Inferior Iliac Spine) 1 cm deep, 1 cm lateral and 1 cm inferior from posterior inferior fissure of SI joint. Laterality: Left-Side  Sedation: Please see nurses note.  Effectiveness during initial hour after procedure(Ultra-Short Term Relief): 100 %   Local anesthetic used: Long-acting (4-6 hours) Effectiveness: Defined as any analgesic benefit obtained secondary to the administration of local anesthetics. This carries significant diagnostic value as to the etiological location, or anatomical origin, of the pain. Duration of benefit is expected to coincide with the duration of the local anesthetic used.  Effectiveness during initial 4-6 hours after procedure(Short-Term Relief): 100 %   Long-term benefit: Defined as any relief past the pharmacologic duration of the local anesthetics.  Effectiveness past the initial 6 hours after procedure(Long-Term Relief): 75 % (pain relief until last week when he twised his knee)   Current benefits: Defined as benefit that persist at this time.   Analgesia:  <50% better   Increased daily steps from 3-4k to 8k after piriformis injection. Last Wednesday, twisted knee which has aggravated his piriformis muscle. He states that the piriformis injections have been very helpful and he has demonstrated increased walking distance and a decrease in his overall pain by at least 80% for 3 to 4 weeks with each injection.  As needed order placed for  repeat piriformis injection.  Laboratory Chemistry Profile   Renal Lab Results  Component Value Date   BUN 11 12/27/2020   CREATININE 0.70 (L) 12/27/2020   BCR 16 12/27/2020   GFRAA 112 12/27/2020   GFRNONAA 97 12/27/2020     Hepatic Lab Results  Component Value Date   AST 18 12/27/2020   ALT 14 12/27/2020   ALBUMIN 4.3 12/27/2020   ALKPHOS 65 12/27/2020     Electrolytes Lab Results  Component Value Date   NA 142 12/27/2020   K 4.1 12/27/2020   CL 107 (H) 12/27/2020   CALCIUM 9.1 12/27/2020     Bone No results found for: VD25OH, VD125OH2TOT, OH6073XT0, GY6948NI6, 25OHVITD1, 25OHVITD2, 25OHVITD3, TESTOFREE, TESTOSTERONE   Inflammation (CRP: Acute Phase) (ESR: Chronic Phase) No results found for: CRP, ESRSEDRATE, LATICACIDVEN     Note: Above Lab results reviewed.  Assessment  The primary encounter diagnosis was Piriformis syndrome of left side. Diagnoses of SI joint arthritis and Chronic pain syndrome were also pertinent to this visit.  Plan of Care    1. Piriformis syndrome of left side - TRIGGER POINT INJECTION; Standing  2. SI joint arthritis - TRIGGER POINT INJECTION; Standing  3. Chronic pain syndrome - TRIGGER POINT INJECTION; Standing  Continue with home physical therapy and piriformis release exercises.  Follow-up as needed for repeat left piriformis injection.  Orders:  Orders Placed This Encounter  Procedures  . TRIGGER POINT INJECTION    Area: Buttocks region (gluteal area) Indications: Piriformis muscle pain;  left piriformis-syndrome; piriformis muscle spasms (E70.350). CPT code: 20552    Standing Status:   Standing    Number of Occurrences:   18    Standing Expiration Date:   10/06/2021    Scheduling Instructions:     Type: Myoneural block (TPI) of piriformis muscle.     Side:  LEFT     Sedation: Patient's choice.     Timeframe: PRN procedure    Order Specific Question:   Where  will this procedure be performed?    Answer:   [facility]   Follow-up plan:   Return if symptoms worsen or fail to improve.     LEFT L4/5 ESI 11/24/20: Not effective.  Left piriformis TPI #1 02/02/2021 (100% pain relief for 2 days),  #2 03/14/21      Recent Visits Date Type Provider Dept  03/14/21 Procedure visit Gillis Santa, MD [facility]  03/02/21 Telemedicine Gillis Santa, MD [facility]  02/02/21 Procedure visit Gillis Santa, MD [facility]  Showing recent visits within past 90 days and meeting all other requirements Today's Visits Date Type Provider Dept  04/06/21 Telemedicine Gillis Santa, MD [facility]  Showing today's visits and meeting all other requirements Future Appointments No visits were found meeting these conditions. Showing future appointments within next 90 days and meeting all other requirements  I discussed the assessment and treatment plan with the patient. The patient was provided an opportunity to ask questions and all were answered. The patient agreed with the plan and demonstrated an understanding of the instructions.  Patient advised to call back or seek an in-person evaluation if the symptoms or condition worsens.  Duration of encounter: 30 minutes.  Note by: Gillis Santa, MD Date: 04/06/2021; Time: 9:42 AM

## 2021-04-18 ENCOUNTER — Ambulatory Visit
Admission: RE | Admit: 2021-04-18 | Discharge: 2021-04-18 | Disposition: A | Discharge status: Home / Self Care | Payer: Medicare PPO | Source: Ambulatory Visit | Attending: Student in an Organized Health Care Education/Training Program | Admitting: Student in an Organized Health Care Education/Training Program

## 2021-04-18 ENCOUNTER — Encounter: Payer: Self-pay | Admitting: Student in an Organized Health Care Education/Training Program

## 2021-04-18 ENCOUNTER — Other Ambulatory Visit: Payer: Self-pay

## 2021-04-18 ENCOUNTER — Ambulatory Visit (HOSPITAL_BASED_OUTPATIENT_CLINIC_OR_DEPARTMENT_OTHER): Payer: Medicare PPO | Admitting: Student in an Organized Health Care Education/Training Program

## 2021-04-18 VITALS — BP 136/80 | HR 62 | Temp 96.9°F | Resp 12 | Ht 75.5 in | Wt 197.0 lb

## 2021-04-18 DIAGNOSIS — G5702 Lesion of sciatic nerve, left lower limb: Secondary | ICD-10-CM | POA: Insufficient documentation

## 2021-04-18 DIAGNOSIS — G894 Chronic pain syndrome: Secondary | ICD-10-CM | POA: Diagnosis present

## 2021-04-18 MED ORDER — DEXAMETHASONE SODIUM PHOSPHATE 10 MG/ML IJ SOLN
INTRAMUSCULAR | Status: AC
  Filled 2021-04-18: qty 1

## 2021-04-18 MED ORDER — DEXAMETHASONE SODIUM PHOSPHATE 10 MG/ML IJ SOLN
10.0000 mg | Freq: Once | INTRAMUSCULAR | Status: AC
  Administered 2021-04-18: 10 mg

## 2021-04-18 MED ORDER — ROPIVACAINE HCL 2 MG/ML IJ SOLN
4.0000 mL | Freq: Once | INTRAMUSCULAR | Status: AC
  Administered 2021-04-18: 4 mL via INTRA_ARTICULAR

## 2021-04-18 MED ORDER — LIDOCAINE HCL (PF) 2 % IJ SOLN
INTRAMUSCULAR | Status: AC
  Filled 2021-04-18: qty 5

## 2021-04-18 MED ORDER — IOHEXOL 180 MG/ML  SOLN
10.0000 mL | Freq: Once | INTRAMUSCULAR | Status: AC
  Administered 2021-04-18: 10 mL via INTRA_ARTICULAR

## 2021-04-18 MED ORDER — LIDOCAINE HCL 2 % IJ SOLN
20.0000 mL | Freq: Once | INTRAMUSCULAR | Status: AC
  Administered 2021-04-18: 400 mg

## 2021-04-18 MED ORDER — ROPIVACAINE HCL 2 MG/ML IJ SOLN
INTRAMUSCULAR | Status: AC
  Filled 2021-04-18: qty 10

## 2021-04-18 NOTE — Progress Notes (Signed)
PROVIDER NOTE: Information contained herein reflects review and annotations entered in association with encounter. Interpretation of such information and data should be left to medically-trained personnel. Information provided to patient can be located elsewhere in the medical record under "Patient Instructions". Document created using STT-dictation technology, any transcriptional errors that may result from process are unintentional.    Patient: Patrick Moyer  Service Category: Procedure  Provider: Edward Jolly, MD  DOB: 11/03/1951  DOS: 04/18/2021  Location: [facility]  MRN: [id number]  Setting: Ambulatory - outpatient  Referring Provider: Daisy Floro, MD  Type: Established Patient  Specialty: Interventional Pain Management  PCP: Daisy Floro, MD   Primary Reason for Visit: Interventional Pain Management Treatment. CC: Leg Pain (Left piriformis)  Procedure:          Anesthesia, Analgesia, Anxiolysis:  Type: Left piriformis trigger point injection #3 under fluoroscopic guidance Region: Inferior Lumbosacral Region Level: PIIS (Posterior Inferior Iliac Spine) 1 cm deep, 1 cm lateral and 1 cm inferior from posterior inferior fissure of SI joint. Laterality: Left-Side    Position: Prone           Indications: 1. Piriformis syndrome of left side   2. Chronic pain syndrome    Pain Score: Pre-procedure: 0-No pain/10 Post-procedure: 0-No pain/10   Pre-op H&P Assessment:  Patrick Moyer is a 70 y.o. (year old), male patient, seen today for interventional treatment. He  has a past surgical history that includes Nasal septum surgery (1975); Wisdom tooth extraction (1975); Finger exploration (Left, 1995); Laparoscopic appendectomy (2010); Inguinal hernia repair (Left, 1978); and Transurethral resection of prostate (N/A, 05/05/2020). Patrick Moyer has a current medication list which includes the following prescription(s): acetaminophen, acyclovir ointment, biotene dry mouth, b  complex vitamins, ca carbonate-mag hydroxide, celecoxib, cholecalciferol, ezetimibe, fexofenadine, fiber, fluticasone, folic acid, gabapentin, glucosamine-chondroitin, guaifenesin, l-lysine, methotrexate, montelukast, nystatin, omeprazole, potassium gluconate, valacyclovir hcl, fexofenadine, and rosuvastatin. His primarily concern today is the Leg Pain (Left piriformis)  Initial Vital Signs:  Pulse/HCG Rate: 73  Temp: (!) 96.9 F (36.1 C) Resp: 16 BP: 127/79 SpO2: 97 %  BMI: Estimated body mass index is 24.3 kg/m as calculated from the following:   Height as of this encounter: 6' 3.5" (1.918 m).   Weight as of this encounter: 197 lb (89.4 kg).  Risk Assessment: Allergies: Reviewed. He is allergic to other, cialis [tadalafil], rosuvastatin, sildenafil, and soy protein [soybean oil].  Allergy Precautions: None required Coagulopathies: Reviewed. None identified.  Blood-thinner therapy: None at this time Active Infection(s): Reviewed. None identified. Patrick Moyer is afebrile  Site Confirmation: Patrick Moyer was asked to confirm the procedure and laterality before marking the site Procedure checklist: Completed Consent: Before the procedure and under the influence of no sedative(s), amnesic(s), or anxiolytics, the patient was informed of the treatment options, risks and possible complications. To fulfill our ethical and legal obligations, as recommended by the American Medical Association's Code of Ethics, I have informed the patient of my clinical impression; the nature and purpose of the treatment or procedure; the risks, benefits, and possible complications of the intervention; the alternatives, including doing nothing; the risk(s) and benefit(s) of the alternative treatment(s) or procedure(s); and the risk(s) and benefit(s) of doing nothing. The patient was provided information about the general risks and possible complications associated with the procedure. These may include, but are not  limited to: failure to achieve desired goals, infection, bleeding, organ or nerve damage, allergic reactions, paralysis, and death. In addition, the patient was informed of  those risks and complications associated to the procedure, such as failure to decrease pain; infection; bleeding; organ or nerve damage with subsequent damage to sensory, motor, and/or autonomic systems, resulting in permanent pain, numbness, and/or weakness of one or several areas of the body; allergic reactions; (i.e.: anaphylactic reaction); and/or death. Furthermore, the patient was informed of those risks and complications associated with the medications. These include, but are not limited to: allergic reactions (i.e.: anaphylactic or anaphylactoid reaction(s)); adrenal axis suppression; blood sugar elevation that in diabetics may result in ketoacidosis or comma; water retention that in patients with history of congestive heart failure may result in shortness of breath, pulmonary edema, and decompensation with resultant heart failure; weight gain; swelling or edema; medication-induced neural toxicity; particulate matter embolism and blood vessel occlusion with resultant organ, and/or nervous system infarction; and/or aseptic necrosis of one or more joints. Finally, the patient was informed that Medicine is not an exact science; therefore, there is also the possibility of unforeseen or unpredictable risks and/or possible complications that may result in a catastrophic outcome. The patient indicated having understood very clearly. We have given the patient no guarantees and we have made no promises. Enough time was given to the patient to ask questions, all of which were answered to the patient's satisfaction. Patrick Moyer has indicated that he wanted to continue with the procedure. Attestation: I, the ordering provider, attest that I have discussed with the patient the benefits, risks, side-effects, alternatives, likelihood of achieving  goals, and potential problems during recovery for the procedure that I have provided informed consent. Date  Time: 04/18/2021  8:02 AM  Pre-Procedure Preparation:  Monitoring: As per clinic protocol. Respiration, ETCO2, SpO2, BP, heart rate and rhythm monitor placed and checked for adequate function Safety Precautions: Patient was assessed for positional comfort and pressure points before starting the procedure. Time-out: I initiated and conducted the "Time-out" before starting the procedure, as per protocol. The patient was asked to participate by confirming the accuracy of the "Time Out" information. Verification of the correct person, site, and procedure were performed and confirmed by me, the nursing staff, and the patient. "Time-out" conducted as per Joint Commission's Universal Protocol (UP.01.01.01). Time: 0826  Description of Procedure:          Target Area: 1 cm deep, 1 cm lateral, 1 cm inferior to the Inferior, posterior, aspect of the sacroiliac fissure Approach: Posterior, paraspinal, ipsilateral approach. Area Prepped: Entire Lower Lumbosacral Region DuraPrep (Iodine Povacrylex [0.7% available iodine] and Isopropyl Alcohol, 74% w/w) Safety Precautions: Aspiration looking for blood return was conducted prior to all injections. At no point did we inject any substances, as a needle was being advanced. No attempts were made at seeking any paresthesias. Safe injection practices and needle disposal techniques used. Medications properly checked for expiration dates. SDV (single dose vial) medications used. Description of the Procedure: Protocol guidelines were followed. The patient was placed in position over the procedure table. The target area was identified and the area prepped in the usual manner. Skin & deeper tissues infiltrated with local anesthetic. Appropriate amount of time allowed to pass for local anesthetics to take effect. The procedure needle was advanced under fluoroscopic  guidance into the sacroiliac joint until a firm endpoint was obtained. Proper needle placement secured. Negative aspiration confirmed. Solution injected in intermittent fashion, asking for systemic symptoms every 0.5cc of injectate. The needles were then removed and the area cleansed, making sure to leave some of the prepping solution back to take advantage of  its long term bactericidal properties. Vitals:   04/18/21 0804 04/18/21 0820 04/18/21 0830 04/18/21 0835  BP: 127/79 128/64 123/79 136/80  Pulse: 73 64 64 62  Resp: 16 17 18 12   Temp: (!) 96.9 F (36.1 C)     TempSrc: Temporal     SpO2: 97% 98% 99% 98%  Weight: 197 lb (89.4 kg)     Height: 6' 3.5" (1.918 m)       Start Time: 0826 hrs. End Time: 0832 hrs. Materials:  Needle(s) Type: Spinal Needle Gauge: 25G Length: 3.5-in Medication(s): Please see orders for medications and dosing details.  5 cc solution made of 4 cc of 0.2% ropivacaine, 1 cc of Decadron 10 mg/cc.  Injected into the left piriformis muscle after contrast confirmation. Imaging Guidance (Non-Spinal):          Type of Imaging Technique: Fluoroscopy Guidance (Non-Spinal) Indication(s): Assistance in needle guidance and placement for procedures requiring needle placement in or near specific anatomical locations not easily accessible without such assistance. Exposure Time: Please see nurses notes. Contrast: Before injecting any contrast, we confirmed that the patient did not have an allergy to iodine, shellfish, or radiological contrast. Once satisfactory needle placement was completed at the desired level, radiological contrast was injected. Contrast injected under live fluoroscopy. No contrast complications. See chart for type and volume of contrast used. Fluoroscopic Guidance: I was personally present during the use of fluoroscopy. "Tunnel Vision Technique" used to obtain the best possible view of the target area. Parallax error corrected before commencing the procedure.  "Direction-depth-direction" technique used to introduce the needle under continuous pulsed fluoroscopy. Once target was reached, antero-posterior, oblique, and lateral fluoroscopic projection used confirm needle placement in all planes. Images permanently stored in EMR. Interpretation: I personally interpreted the imaging intraoperatively. Adequate needle placement confirmed in multiple planes. Appropriate spread of contrast into desired area was observed. No evidence of afferent or efferent intravascular uptake. Permanent images saved into the patient's record.  Post-operative Assessment:  Post-procedure Vital Signs:  Pulse/HCG Rate: 62  Temp: (!) 96.9 F (36.1 C) Resp: 12 BP: 136/80 SpO2: 98 %  EBL: None  Complications: No immediate post-treatment complications observed by team, or reported by patient.  Note: The patient tolerated the entire procedure well. A repeat set of vitals were taken after the procedure and the patient was kept under observation following institutional policy, for this type of procedure. Post-procedural neurological assessment was performed, showing return to baseline, prior to discharge. The patient was provided with post-procedure discharge instructions, including a section on how to identify potential problems. Should any problems arise concerning this procedure, the patient was given instructions to immediately contact , at any time, without hesitation. In any case, we plan to contact the patient by telephone for a follow-up status report regarding this interventional procedure.  Comments:  No additional relevant information.  Plan of Care  Orders:  Orders Placed This Encounter  Procedures  . DG PAIN CLINIC C-ARM 1-60 MIN NO REPORT    Intraoperative interpretation by procedural physician at [facility].    Standing Status:   Standing    Number of Occurrences:   1    Order Specific Question:   Reason for exam:    Answer:   Assistance in needle  guidance and placement for procedures requiring needle placement in or near specific anatomical locations not easily accessible without such assistance.   Medications ordered for procedure: Meds ordered this encounter  Medications  . iohexol (OMNIPAQUE) 180 MG/ML injection  10 mL    Must be Myelogram-compatible. If not available, you may substitute with a water-soluble, non-ionic, hypoallergenic, myelogram-compatible radiological contrast medium.  Marland Kitchen. lidocaine (XYLOCAINE) 2 % (with pres) injection 400 mg  . ropivacaine (PF) 2 mg/mL (0.2%) (NAROPIN) injection 4 mL  . dexamethasone (DECADRON) injection 10 mg   Medications administered: We administered iohexol, lidocaine, ropivacaine (PF) 2 mg/mL (0.2%), and dexamethasone.  See the medical record for exact dosing, route, and time of administration.  Follow-up plan:   Return in about 4 weeks (around 05/16/2021) for Post Procedure Evaluation, virtual.      LEFT L4/5 ESI 11/24/20: Not effective.  Left piriformis TPI #1 02/02/2021 (100% pain relief for 2 days),  #2 03/14/21, #3 04/18/21     Recent Visits Date Type Provider Dept  04/06/21 Telemedicine Edward JollyLateef, Nylia Gavina, MD [facility]  03/14/21 Procedure visit Edward JollyLateef, Jacquese Hackman, MD [facility]  03/02/21 Telemedicine Edward JollyLateef, Aleesha Ringstad, MD [facility]  02/02/21 Procedure visit Edward JollyLateef, Tjay Velazquez, MD [facility]  Showing recent visits within past 90 days and meeting all other requirements Today's Visits Date Type Provider Dept  04/18/21 Procedure visit Edward JollyLateef, Lakena Sparlin, MD [facility]  Showing today's visits and meeting all other requirements Future Appointments No visits were found meeting these conditions. Showing future appointments within next 90 days and meeting all other requirements  Disposition: Discharge home  Discharge (Date  Time): 04/18/2021;   hrs.   Primary Care Physician: Daisy Florooss, Charles Alan, MD [facility] Note by:  Edward JollyBilal Jamonica Schoff, MD Date: 04/18/2021; Time: 8:39 AM  Disclaimer:  Medicine is not an exact science. The only guarantee in medicine is that nothing is guaranteed. It is important to note that the decision to proceed with this intervention was based on the information collected from the patient. The Data and conclusions were drawn from the patient's questionnaire, the interview, and the physical examination. Because the information was provided in large part by the patient, it cannot be guaranteed that it has not been purposely or unconsciously manipulated. Every effort has been made to obtain as much relevant data as possible for this evaluation. It is important to note that the conclusions that lead to this procedure are derived in large part from the available data. Always take into account that the treatment will also be dependent on availability of resources and existing treatment guidelines, considered by other Pain Management Practitioners as being common knowledge and practice, at the time of the intervention. For Medico-Legal purposes, it is also important to point out that variation in procedural techniques and pharmacological choices are the acceptable norm. The indications, contraindications, technique, and results of the above procedure should only be interpreted and judged by a Board-Certified Interventional Pain Specialist with extensive familiarity and expertise in the same exact procedure and technique.

## 2021-04-18 NOTE — Progress Notes (Signed)
Safety precautions to be maintained throughout the outpatient stay will include: orient to surroundings, keep bed in low position, maintain call bell within reach at all times, provide assistance with transfer out of bed and ambulation.  

## 2021-04-18 NOTE — Patient Instructions (Signed)

## 2021-04-19 ENCOUNTER — Telehealth: Payer: Self-pay

## 2021-04-19 DIAGNOSIS — E785 Hyperlipidemia, unspecified: Secondary | ICD-10-CM

## 2021-04-19 NOTE — Telephone Encounter (Signed)
Called and lmomed pt to schedule lipid and hepatic

## 2021-04-19 NOTE — Telephone Encounter (Signed)
-----   Message from Olene Floss, RPH-CPP sent at 04/19/2021  7:33 AM EDT -----  ----- Message ----- From: Olene Floss, RPH-CPP Sent: 04/13/2021  12:00 AM EDT To: Olene Floss, RPH-CPP  Schedule lipid labs

## 2021-04-19 NOTE — Telephone Encounter (Signed)
PP  Did well -No pain. "I need to do this everyday" denies any needs at this time. Instructed to call if needed.

## 2021-04-26 ENCOUNTER — Telehealth: Payer: Self-pay

## 2021-04-26 NOTE — Telephone Encounter (Signed)
lmom for labs 

## 2021-04-26 NOTE — Telephone Encounter (Signed)
-----   Message from Olene Floss, RPH-CPP sent at 04/26/2021  9:09 AM EDT ----- Can you try calling him again? ----- Message ----- From: Eather Colas, CMA Sent: 04/19/2021   8:11 AM EDT To: Olene Floss, RPH-CPP  lmom ----- Message ----- From: Olene Floss, RPH-CPP Sent: 04/19/2021   7:33 AM EDT To: Eather Colas, CMA   ----- Message ----- From: Olene Floss, RPH-CPP Sent: 04/13/2021  12:00 AM EDT To: Olene Floss, RPH-CPP  Schedule lipid labs

## 2021-05-06 ENCOUNTER — Telehealth: Payer: Self-pay | Admitting: *Deleted

## 2021-05-06 NOTE — Telephone Encounter (Signed)
   [facility] Pre-operative Risk Assessment    Patient Name: Patrick Moyer  DOB: [birth year]  MRN: [id number]   HEARTCARE STAFF: - Please ensure there is not already an duplicate clearance open for this procedure. - Under Visit Info/Reason for Call, type in Other and utilize the format Clearance MM/DD/YY or Clearance TBD. Do not use dashes or single digits. - If request is for dental extraction, please clarify the # of teeth to be extracted.  Request for surgical clearance:  1. What type of surgery is being performed? LEFT KNEE SCOPE MENISECTOMY    2. When is this surgery scheduled? TBD   3. What type of clearance is required (medical clearance vs. Pharmacy clearance to hold med vs. Both)? MEDICAL  4. Are there any medications that need to be held prior to surgery and how long? NONE LISTED   5. Practice name and name of physician performing surgery? [facility]; DR. Vonna Kotyk [place]   6. What is the office phone number? [phone]   7.   What is the office fax number? [place].   Anesthesia type (None, local, MAC, general) ? CHOICE   Julaine Hua 05/06/2021, 3:10 PM  _________________________________________________________________   (provider comments below)

## 2021-05-06 NOTE — Telephone Encounter (Signed)
   Name: Patrick Moyer  DOB: 1951-04-01  MRN: [id number]   Primary Cardiologist: None  Chart reviewed as part of pre-operative protocol coverage.   Left VM  Requesting call back.   Alver Sorrow, NP 05/06/2021, 3:34 PM

## 2021-05-10 ENCOUNTER — Encounter: Payer: Self-pay | Admitting: *Deleted

## 2021-05-10 NOTE — Telephone Encounter (Signed)
   Primary Cardiologist: previously Dr. Delton See >> will need to est with new Cardiologist   Chart reviewed as part of pre-operative protocol coverage.   70 y.o. male with  Ascending aortic aneurysm   CT 01/05/21: 3.9 cm ascending aorta  Marfanoid habitus  FHx of Marfan Syndrome   Hyperlipidemia   Coronary artery disease   Non-obs dz by CT in 2020  OSA   Last OV 12/27/20, Dr. Delton See Procedure: L knee arthroscopy   RCRI:  Perioperative Risk of Major Cardiac Event is (%): 0.4 (low risk) DASI:  Functional Capacity in METs is: 6.98 (functional status is good )  Patient was contacted 05/10/2021 in reference to pre-operative risk assessment for pending surgery as outlined below.    Since last seen, Patrick Moyer has done well without chest pain, shortness of breath.  He swims a mile about 4 days a week.  Recommendations: . Therefore, based on ACC/AHA guidelines, the patient is at acceptable risk for the planned procedure without further cardiovascular testing.    Please call with questions. Tereso Newcomer, PA-C 05/10/2021, 3:41 PM

## 2021-05-10 NOTE — Telephone Encounter (Signed)
PT is returning a call 

## 2021-05-10 NOTE — Telephone Encounter (Signed)
Notes faxed to surgeon. This phone note will be removed from the preop pool. Tereso Newcomer, PA-C  05/10/2021 3:43 PM

## 2021-05-17 ENCOUNTER — Encounter: Payer: Self-pay | Admitting: Student in an Organized Health Care Education/Training Program

## 2021-05-18 ENCOUNTER — Other Ambulatory Visit: Payer: Self-pay

## 2021-05-18 ENCOUNTER — Encounter: Payer: Self-pay | Admitting: Student in an Organized Health Care Education/Training Program

## 2021-05-18 ENCOUNTER — Ambulatory Visit
Payer: Medicare PPO | Attending: Student in an Organized Health Care Education/Training Program | Admitting: Student in an Organized Health Care Education/Training Program

## 2021-05-18 DIAGNOSIS — M47816 Spondylosis without myelopathy or radiculopathy, lumbar region: Secondary | ICD-10-CM | POA: Diagnosis not present

## 2021-05-18 DIAGNOSIS — G5702 Lesion of sciatic nerve, left lower limb: Secondary | ICD-10-CM

## 2021-05-18 DIAGNOSIS — M4802 Spinal stenosis, cervical region: Secondary | ICD-10-CM

## 2021-05-18 DIAGNOSIS — G8929 Other chronic pain: Secondary | ICD-10-CM

## 2021-05-18 DIAGNOSIS — G894 Chronic pain syndrome: Secondary | ICD-10-CM

## 2021-05-18 DIAGNOSIS — M5416 Radiculopathy, lumbar region: Secondary | ICD-10-CM

## 2021-05-18 NOTE — Progress Notes (Signed)
Patient: Patrick Moyer  Service Category: E/M  Provider: Gillis Santa, Moyer  DOB: [birth year]  DOS: 05/18/2021  Location: Office  MRN: [id number]  Setting: Ambulatory outpatient  Referring Provider: Lawerance Cruel, Moyer  Type: Established Patient  Specialty: Interventional Pain Management  PCP: Patrick Cruel, Moyer  Location: Home  Delivery: TeleHealth     Virtual Encounter - Pain Management PROVIDER NOTE: Information contained herein reflects review and annotations entered in association with encounter. Interpretation of such information and data should be left to medically-trained personnel. Information provided to patient can be located elsewhere in the medical record under "Patient Instructions". Document created using STT-dictation technology, any transcriptional errors that may result from process are unintentional.    Contact & Pharmacy Preferred: [phone] Home: [phone] (home) Mobile: [phone] (mobile) E-mail: [email or web address]  [id number], [place] [address] Phone: [phone] Fax: [phone]  [id number] [id number]: [phone]: [phone]  Pre-screening  Patrick Moyer offered "in-person" vs "virtual" encounter. He indicated preferring virtual for this encounter.   Reason COVID-19*  Social distancing based on CDC and AMA recommendations.   I contacted Patrick Moyer 05/18/2021 via video conference.      I clearly identified myself as BGillis Santa Moyer. I verified that I was speaking with the correct person using two identifiers (Name: Patrick Moyer and date of birth: 69-Jun-1952.  Consent I sought verbal advanced consent from Patrick Jumpfor virtual visit interactions. I informed Mr. WKielbasaof possible security and privacy concerns, risks, and limitations associated with providing  "not-in-person" medical evaluation and management services. I also informed Mr. WFultzof the availability of "in-person" appointments. Finally, I informed him that there would be a charge for the virtual visit and that he could be  personally, fully or partially, financially responsible for it. Patrick Moyer understanding and agreed to proceed.   Historic Elements   Patrick Moyer a 70y.o. year old, male patient evaluated today after our last contact on 04/18/2021. Patrick Moyer has a past medical history of ALLERGIC RHINITIS, Arthritis pain of wrist, Bell's palsy, Benign localized prostatic hyperplasia with lower urinary tract symptoms (LUTS), Chronic bilateral low back pain with bilateral sciatica, Diverticulosis, ED (erectile dysfunction), Environmental and seasonal allergies, Family history of Marfan syndrome, GERD (gastroesophageal reflux disease), Hiatal hernia, High cholesterol, History of closed head injury, History of positive PPD (age 70, HSV-1 infection, Marfanoid habitus, Mild CAD, Non-celiac gluten sensitivity, Numbness and tingling in both hands, OSA on CPAP, Pigment dispersion syndrome, Polyneuropathy, Pulmonary nodules, Radiculopathy of leg, Thoracic ascending aortic aneurysm ([place], and Wears glasses. He also  has a past surgical history that includes Nasal septum surgery (1975); Wisdom tooth extraction (1975); Finger exploration (Left, 1995); Laparoscopic appendectomy (2010); Inguinal hernia repair (Left, 1978); and Transurethral resection of prostate (N/A, 05/05/2020). Mr. WSalviahas a current medication list which includes the following prescription(s): acetaminophen, acyclovir ointment, biotene dry mouth, b complex vitamins, ca carbonate-mag hydroxide, celecoxib, cholecalciferol, ezetimibe, fexofenadine, fiber, fluticasone, folic acid, gabapentin, glucosamine-chondroitin, guaifenesin, l-lysine, methotrexate, montelukast, nystatin, omeprazole, potassium gluconate, valacyclovir hcl,  fexofenadine, and rosuvastatin. He  reports that he has never smoked. He has never used smokeless tobacco. He reports that he does not drink alcohol and does not use drugs. Patrick Moyer allergic to other, cialis [tadalafil], rosuvastatin, sildenafil, and soy protein [soybean oil].  HPI  Today, he is being contacted for a post-procedure assessment.  Post-Procedure Evaluation  Procedure (04/18/2021):   Type: Left piriformis trigger point injection #3 under fluoroscopic guidance Region: Inferior Lumbosacral Region Level: PIIS (Posterior Inferior Iliac Spine) 1 cm deep, 1 cm lateral and 1 cm inferior from posterior inferior fissure of SI joint. Laterality: Left-Side  Sedation: Please see nurses note.  Effectiveness during initial hour after procedure(Ultra-Short Term Relief): 100 %   Local anesthetic used: Long-acting (4-6 hours) Effectiveness: Defined as any analgesic benefit obtained secondary to the administration of local anesthetics. This carries significant diagnostic value as to the etiological location, or anatomical origin, of the pain. Duration of benefit is expected to coincide with the duration of the local anesthetic used.  Effectiveness during initial 4-6 hours after procedure(Short-Term Relief): 100 %  Long-term benefit: Defined as any relief past the pharmacologic duration of the local anesthetics.  Effectiveness past the initial 6 hours after procedure(Long-Term Relief): 75 %  Current benefits: Defined as benefit that persist at this time.   Analgesia:  50% improved Function: Somewhat improved   Laboratory Chemistry Profile   Renal Lab Results  Component Value Date   BUN 11 12/27/2020   CREATININE 0.70 (L) 12/27/2020   BCR 16 12/27/2020   GFRAA 112 12/27/2020   GFRNONAA 97 12/27/2020     Hepatic Lab Results  Component Value Date   AST 18 12/27/2020   ALT 14 12/27/2020   ALBUMIN 4.3 12/27/2020   ALKPHOS 65 12/27/2020     Electrolytes Lab Results   Component Value Date   NA 142 12/27/2020   K 4.1 12/27/2020   CL 107 (H) 12/27/2020   CALCIUM 9.1 12/27/2020     Bone No results found for: VD25OH, VD125OH2TOT, LP3790WI0, XB3532DJ2, 25OHVITD1, 25OHVITD2, 25OHVITD3, TESTOFREE, TESTOSTERONE   Inflammation (CRP: Acute Phase) (ESR: Chronic Phase) No results found for: CRP, ESRSEDRATE, LATICACIDVEN     Note: Above Lab results reviewed.   Assessment  The primary encounter diagnosis was Piriformis syndrome of left side. Diagnoses of Foraminal stenosis of cervical region, Chronic radicular lumbar pain, Lumbar radiculopathy, Lumbar facet arthropathy, and Chronic pain syndrome were also pertinent to this visit.  Plan of Care   Patrick Moyer has a current medication list which includes the following long-term medication(s): ezetimibe, fexofenadine, gabapentin, omeprazole, potassium gluconate, fexofenadine, and rosuvastatin.  Orders:  Orders Placed This Encounter  Procedures  . TRIGGER POINT INJECTION    Area: Buttocks region (gluteal area) Indications: Piriformis muscle pain;  LEFT  piriformis-syndrome; piriformis muscle spasms (E26.834). CPT code: 20552    Standing Status:   Standing    Number of Occurrences:   3    Standing Expiration Date:   11/18/2021    Scheduling Instructions:     Type: Myoneural block (TPI) of piriformis muscle.     Side:  LEFT     Sedation: Patient's choice.     Timeframe: Today    Order Specific Question:   Where will this procedure be performed?    Answer:   [facility]   Follow-up plan:   Return if symptoms worsen or fail to improve.     LEFT L4/5 ESI 11/24/20: Not effective.  Left piriformis TPI #1 02/02/2021 (100% pain relief for 2 days),  #2 03/14/21, #3 04/18/21      Recent Visits Date Type Provider Dept  04/18/21 Procedure visit Patrick Santa, Moyer [facility]  04/06/21 Telemedicine Patrick Santa, Moyer [facility]  03/14/21 Procedure visit  Patrick Santa, Moyer [facility]  03/02/21 Telemedicine Patrick Santa, Moyer [facility]  Showing recent visits within past 90 days and meeting all other requirements Today's Visits Date Type Provider Dept  05/18/21 Telemedicine Patrick Santa, Moyer [facility]  Showing today's visits and meeting all other requirements Future Appointments No visits were found meeting these conditions. Showing future appointments within next 90 days and meeting all other requirements  I discussed the assessment and treatment plan with the patient. The patient was provided an opportunity to ask questions and all were answered. The patient agreed with the plan and demonstrated an understanding of the instructions.  Patient advised to call back or seek an in-person evaluation if the symptoms or condition worsens.  Duration of encounter: 15 minutes.  Note by: Patrick Santa, Moyer Date: 05/18/2021; Time: 2:28 PM

## 2021-06-01 ENCOUNTER — Ambulatory Visit: Payer: Medicare PPO

## 2021-06-01 ENCOUNTER — Telehealth: Payer: Self-pay | Admitting: Pharmacist

## 2021-06-01 ENCOUNTER — Telehealth (INDEPENDENT_AMBULATORY_CARE_PROVIDER_SITE_OTHER): Payer: Medicare PPO | Admitting: Pharmacist

## 2021-06-01 ENCOUNTER — Other Ambulatory Visit: Payer: Self-pay

## 2021-06-01 DIAGNOSIS — E782 Mixed hyperlipidemia: Secondary | ICD-10-CM

## 2021-06-01 DIAGNOSIS — R931 Abnormal findings on diagnostic imaging of heart and coronary circulation: Secondary | ICD-10-CM | POA: Insufficient documentation

## 2021-06-01 NOTE — Progress Notes (Signed)
Patient ID: Patrick Moyer                 DOB: [birth year]                    MRN: [id number]     HPI: Patrick Moyer is a 71 y.o. male patient referred to lipid clinic by Dr. Delton See. PMH is significant for ascending aortic aneurysm, Marfanoid habitus (CTA 12/2020 showed stable 3.9cm ascending aorta, 3.1cm arch aneurysm), CAD on 09/2019 CTA (coronary calcium score of 57 which was 41st percentile for age and sex match; minimal non-obstructive CAD 0-24% in the prox LAD), and OSA on CPAP. He has not tolerated cholesterol medications and was seen in lipid clinic 12/2020 and subsequently started on rosuvastatin 5mg  3x per week. Follow up lipid panel showed reduction in LDL of 26%, although LDL remains elevated at 83 above goal < 70.  Pt called today for telehealth visit to discuss results and further management. Previously experienced muscle pains after taking rosuvastatin 5mg  daily for about 3 months. Symptoms resolved with statin discontinuation. Experienced flushing with niacin and arm pain that affected his swimming with [employer]. He is active, swims 5-6 days a week and eats a heart healthy diet. He tried rosuvastatin 3 days a week but did not tolerate therapy and has since decreased frequency to once weekly which he feels better on. Eats a Mediterranean diet at home, does eat more fast food when traveling, staying active with swimming.  Current Medications: rosuvastatin 5mg  1x per week Intolerances: rosuvastatin 5mg  daily (muscle aches), niacin (flushing), ezetimibe (muscle aches) Risk Factors: CAD - elevated coronary calcium score 57 LDL goal: < 70mg /dL  Diet: Mediterranean diet at home, a bit worse when he travels (a bit more fast food). Likes eggs, , beans, lentils, avocado toast. Wife is doing .  Exercise: swim 4 to 6 miles per week at the [facility] aquatic center (mile a day 5-6 days a week)  Family History: The patient's family history includes Aortic aneurysm in his father; Bipolar  disorder in his brother; CAD in his father (had MI at age 46 after aortic aneurysm rupture); Glaucoma in his brother; Hypertension in his father; Marfan syndrome in his brother; Obesity in his brother; Osteoarthritis in his sister; Ovarian cancer in his mother. Paternal uncle and maternal cousin with AAA.  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 Denies tobacco, alcohol, and drug use.  Labs:  12/27/20: TC 174, TG 69, HDL 49, LDL 112, non HDL 25 (niacin) 05/11/21: TC 145, TG 35, HDL 55, LDL 83 (rosuvastatin 5mg  1x per week)  Past Medical History:  Diagnosis Date  . ALLERGIC RHINITIS   . Arthritis pain of wrist   . Bell's palsy    per pt 1987 left side, 20% residual effect's eyelid  . Benign localized prostatic hyperplasia with lower urinary tract symptoms (LUTS)   . Chronic bilateral low back pain with bilateral sciatica   . Diverticulosis   . ED (erectile dysfunction)   . Environmental and seasonal allergies   . Family history of Marfan syndrome    father, paternal uncle, and brother  . GERD (gastroesophageal reflux disease)   . Hiatal hernia   . High cholesterol   . History of closed head injury    04-28-2020 per pt yrs ago w/ no loc, residual mild memory loss which has resolved  . 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 22   per pt treated with INH for one year and cxr's  since have been normal  . HSV-1 infection   . Marfanoid habitus    per pt has wing span is longer than his height, effects eyes (iris), hyperflexibility of ankle and hands,  AAA  . Mild CAD    cardiologist--  dr Delton See---  CTA 10-22-2019  . Non-celiac gluten sensitivity   . Numbness and tingling in both hands    per pt intermittantly due to cervical spine  . OSA on CPAP    auto 8-10,  pt stated uses every night  . 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)  . Polyneuropathy    left leg, lumbar nerve   . Pulmonary nodules    per pt followed by pcp (CTA and CT chest  10-22-2019 , stable)  . Radiculopathy of leg   . Thoracic ascending aortic aneurysm [facility])    cardiologist--- dr Eloy End--- CTA 10-22-2019  ascending aorta 103mm and aortic root 53mm  . Wears glasses     Current Outpatient Medications on File Prior to Visit  Medication Sig Dispense Refill  . acetaminophen (TYLENOL) 500 MG tablet Take 500 mg by mouth at bedtime.    Marland Kitchen acyclovir ointment (ZOVIRAX) 5 % Apply 1 application topically every 3 (three) hours.     . Artificial Saliva (BIOTENE DRY MOUTH) LOZG Use as directed in the mouth or throat.    . B Complex Vitamins (B COMPLEX 100 PO) Take by mouth 2 (two) times daily.     . Ca Carbonate-Mag Hydroxide (ROLAIDS PO) Take by mouth as needed.    . celecoxib (CELEBREX) 200 MG capsule Take 200 mg by mouth 2 (two) times daily.    . cholecalciferol (VITAMIN D) 1000 UNITS tablet Take 5,000 Units by mouth daily.    Marland Kitchen ezetimibe (ZETIA) 10 MG tablet Take 1 tablet (10 mg total) by mouth daily. 30 tablet 1  . fexofenadine (ALLEGRA) 180 MG tablet Take 180 mg by mouth daily.    . fexofenadine (ALLEGRA) 60 MG tablet Take 60 mg by mouth See admin instructions. (Patient not taking: Reported on 05/17/2021)    . FIBER PO Take by mouth daily.    . fluticasone (FLONASE) 50 MCG/ACT nasal spray Place into both nostrils 3 (three) times daily.    . folic acid (FOLVITE) 1 MG tablet Take 1 mg by mouth daily.    Marland Kitchen gabapentin (NEURONTIN) 300 MG capsule Take 3 capsules (900 mg total) by mouth 3 (three) times daily. 270 capsule 2  . glucosamine-chondroitin 500-400 MG tablet Take 1 tablet by mouth daily.    Marland Kitchen guaiFENesin (MUCINEX) 600 MG 12 hr tablet Take 1,200 mg by mouth.     . L-Lysine 500 MG CAPS Take 1 capsule by mouth in the morning, at noon, and at bedtime.     . methotrexate (RHEUMATREX) 2.5 MG tablet Take 15 mg by mouth once a week. Caution:Chemotherapy. Protect from light.    . montelukast (SINGULAIR) 10 MG tablet Take 10 mg by mouth at bedtime.     Marland Kitchen nystatin  (MYCOSTATIN) 100000 UNIT/ML suspension Take 5 mLs by mouth as needed.     Marland Kitchen omeprazole (PRILOSEC) 20 MG capsule Take 20 mg by mouth daily.    . Potassium Gluconate 550 (90 K) MG TABS Take by mouth daily.    . rosuvastatin (CRESTOR) 5 MG tablet Take 1 tablet (5 mg total) by mouth 3 (three) times a week. (Patient taking differently: Take 5 mg by mouth once  a week.) 38 tablet 3  . VALACYCLOVIR HCL PO Take by mouth.     No current facility-administered medications on file prior to visit.    Allergies  Allergen Reactions  . Other     Adhesive, Caffene-abd pain  . Cialis [Tadalafil]     Caused acute angle glaucoma  . Rosuvastatin Other (See Comments)    Causes joint aches  . Sildenafil     Caused acute angle glaucoma  . Soy Protein [Soybean Oil]     Gastrointestinal upset    Assessment/Plan:  1. Hyperlipidemia - LDL has improved from 112 to 83 since rechallenging with low dose rosuvastatin 5mg  once weekly. LDL goal < 70 given elevated coronary calcium score. He did not tolerate 3x per week dosing but is agreeable to increase rosuvastatin frequency to 5mg  2x per week. Advised pt if he does not tolerate higher frequency, to reduce back to once weekly dosing and would plan to rechallenge with lower dose of ezetimibe 5mg  daily which still has comparable LDL reduction to 10mg  dose but is typically better tolerated. Will call pt in 1 month for tolerability update.   Patrick Moyer, PharmD, BCACP, CPP [facility] [address] [place],  Phone: [phone]; Fax: [phone] 06/01/2021 1:55 PM

## 2021-06-01 NOTE — Telephone Encounter (Signed)
Called pt for telehealth visit today, no answer. Left message for pt.

## 2021-06-20 ENCOUNTER — Other Ambulatory Visit: Payer: Self-pay | Admitting: Student in an Organized Health Care Education/Training Program

## 2021-06-22 ENCOUNTER — Other Ambulatory Visit: Payer: Self-pay | Admitting: Student in an Organized Health Care Education/Training Program

## 2021-06-22 NOTE — Telephone Encounter (Signed)
Patient wants to know if Dr. Cherylann Ratel if going to continue his gabapentin? Also he just had knee surgery last week and is unable to come in to clinic but needs refill on gabapentin. Please advise.

## 2021-06-24 ENCOUNTER — Telehealth: Payer: Self-pay | Admitting: Student in an Organized Health Care Education/Training Program

## 2021-06-24 ENCOUNTER — Other Ambulatory Visit: Payer: Self-pay | Admitting: *Deleted

## 2021-06-24 MED ORDER — GABAPENTIN 300 MG PO CAPS: 900.0000 mg | ORAL_CAPSULE | Freq: Three times a day (TID) | ORAL | 2 refills | Status: DC

## 2021-06-24 NOTE — Progress Notes (Signed)
Contacted Dr Cherylann Ratel about patient needing his gabapentin.  He has asked that I send that in for patient as he is out of the office and not near a computer.  I have reordered the medication.   Called patient to make him aware.

## 2021-06-24 NOTE — Telephone Encounter (Signed)
Contacted Dr Cherylann Ratel and he asked for me to send this in.  Done and patient aware.

## 2021-06-24 NOTE — Telephone Encounter (Addendum)
Patient is calling for med refill. Wants to know if he is going to have to go thru the weekend with no meds. Wants someone to call Dr. Cherylann Ratel and get his meds called in.  Per vmail today at 9:26  If you cannot reach him on [phone] call [phone]

## 2021-06-29 ENCOUNTER — Telehealth: Payer: Self-pay | Admitting: Pharmacist

## 2021-06-29 ENCOUNTER — Telehealth: Payer: Self-pay

## 2021-06-29 DIAGNOSIS — E782 Mixed hyperlipidemia: Secondary | ICD-10-CM

## 2021-06-29 NOTE — Telephone Encounter (Signed)
I called patient and he is requesting a  left piriformis muscle injection. Can you put in an order and the front desk will schedule him. Thanks

## 2021-06-29 NOTE — Telephone Encounter (Signed)
Called pt to follow up with rosuvastatin tolerability. He reports tolerating 5mg  dose 2x per week well. Scheduled follow up labs in 1 month to assess efficacy. LDL goal < 70 due to elevated calcium score. Can increase rosuvastatin frequency if needed pending upcoming lab results.

## 2021-06-29 NOTE — Telephone Encounter (Signed)
Pt wants to know if he could get another injection if so I will schedule the appt when the order is in if you call the pt please leave a voicemail he states his phone doesn't ring all the time

## 2021-07-18 ENCOUNTER — Ambulatory Visit
Admission: RE | Admit: 2021-07-18 | Discharge: 2021-07-18 | Disposition: A | Discharge status: Home / Self Care | Payer: Medicare PPO | Source: Ambulatory Visit | Attending: Student in an Organized Health Care Education/Training Program | Admitting: Student in an Organized Health Care Education/Training Program

## 2021-07-18 ENCOUNTER — Other Ambulatory Visit: Payer: Self-pay

## 2021-07-18 ENCOUNTER — Ambulatory Visit (HOSPITAL_BASED_OUTPATIENT_CLINIC_OR_DEPARTMENT_OTHER): Payer: Medicare PPO | Admitting: Student in an Organized Health Care Education/Training Program

## 2021-07-18 ENCOUNTER — Encounter: Payer: Self-pay | Admitting: Student in an Organized Health Care Education/Training Program

## 2021-07-18 VITALS — BP 136/87 | HR 60 | Temp 97.1°F | Resp 20 | Ht 76.0 in | Wt 199.0 lb

## 2021-07-18 DIAGNOSIS — G5702 Lesion of sciatic nerve, left lower limb: Secondary | ICD-10-CM | POA: Insufficient documentation

## 2021-07-18 DIAGNOSIS — G894 Chronic pain syndrome: Secondary | ICD-10-CM | POA: Insufficient documentation

## 2021-07-18 MED ORDER — LIDOCAINE HCL 2 % IJ SOLN
20.0000 mL | Freq: Once | INTRAMUSCULAR | Status: AC
  Administered 2021-07-18: 100 mg

## 2021-07-18 MED ORDER — ROPIVACAINE HCL 2 MG/ML IJ SOLN
INTRAMUSCULAR | Status: AC
  Filled 2021-07-18: qty 20

## 2021-07-18 MED ORDER — ROPIVACAINE HCL 2 MG/ML IJ SOLN
4.0000 mL | Freq: Once | INTRAMUSCULAR | Status: AC
  Administered 2021-07-18: 20 mL via INTRA_ARTICULAR

## 2021-07-18 MED ORDER — LIDOCAINE HCL (PF) 2 % IJ SOLN
INTRAMUSCULAR | Status: AC
  Filled 2021-07-18: qty 5

## 2021-07-18 MED ORDER — DEXAMETHASONE SODIUM PHOSPHATE 10 MG/ML IJ SOLN
10.0000 mg | Freq: Once | INTRAMUSCULAR | Status: AC
  Administered 2021-07-18: 10 mg
  Filled 2021-07-18: qty 1

## 2021-07-18 MED ORDER — IOHEXOL 180 MG/ML  SOLN
10.0000 mL | Freq: Once | INTRAMUSCULAR | Status: AC
  Administered 2021-07-18: 10 mL via INTRA_ARTICULAR
  Filled 2021-07-18: qty 20

## 2021-07-18 NOTE — Progress Notes (Signed)
PROVIDER NOTE: Information contained herein reflects review and annotations entered in association with encounter. Interpretation of such information and data should be left to medically-trained personnel. Information provided to patient can be located elsewhere in the medical record under "Patient Instructions". Document created using STT-dictation technology, any transcriptional errors that may result from process are unintentional.    Patient: Patrick Moyer  Service Category: Procedure  Provider: Edward Jolly, MD  DOB: [birth year]  DOS: 07/18/2021  Location: [facility]  MRN: [id number]  Setting: Ambulatory - outpatient  Referring Provider: Daisy Floro, MD  Type: Established Patient  Specialty: Interventional Pain Management  PCP: Daisy Floro, MD   Primary Reason for Visit: Interventional Pain Management Treatment. CC: Rectal Pain (buttock)  Procedure:          Anesthesia, Analgesia, Anxiolysis:  Type: Left piriformis trigger point injection #4 under fluoroscopic guidance Region: Inferior Lumbosacral Region Level: PIIS (Posterior Inferior Iliac Spine) 1 cm deep, 1 cm lateral and 1 cm inferior from posterior inferior fissure of SI joint. Laterality: Left-Side    Position: Prone           Indications: 1. Piriformis syndrome of left side   2. Chronic pain syndrome     Pain Score: Pre-procedure: 3/10 Post-procedure: 0-No pain/10   Pre-op H&P Assessment:  Patrick Moyer is a 70 y.o. (year old), male patient, seen today for interventional treatment. He  has a past surgical history that includes Nasal septum surgery (1975); Wisdom tooth extraction (1975); Finger exploration (Left, 1995); Laparoscopic appendectomy (2010); Inguinal hernia repair (Left, 1978); and Transurethral resection of prostate (N/A, 05/05/2020). Patrick Moyer has a current medication list which includes the following prescription(s): acetaminophen, acyclovir ointment, biotene dry mouth, b complex  vitamins, ca carbonate-mag hydroxide, celecoxib, cholecalciferol, fexofenadine, fiber, fluticasone, gabapentin, glucosamine-chondroitin, guaifenesin, l-lysine, montelukast, nystatin, omeprazole, rosuvastatin, valacyclovir hcl, folic acid, and methotrexate. His primarily concern today is the Rectal Pain (buttock)  Initial Vital Signs:  Pulse/HCG Rate: 60ECG Heart Rate: (!) 58 Temp: (!) 97.1 F (36.2 C) Resp: 14 BP: 128/73 SpO2: 99 %  BMI: Estimated body mass index is 24.22 kg/m as calculated from the following:   Height as of this encounter: 6\' 4"  (1.93 m).   Weight as of this encounter: 199 lb (90.3 kg).  Risk Assessment: Allergies: Reviewed. He is allergic to other, cialis [tadalafil], rosuvastatin, sildenafil, and soy protein [soybean oil].  Allergy Precautions: None required Coagulopathies: Reviewed. None identified.  Blood-thinner therapy: None at this time Active Infection(s): Reviewed. None identified. Patrick Moyer is afebrile  Site Confirmation: Patrick Moyer was asked to confirm the procedure and laterality before marking the site Procedure checklist: Completed Consent: Before the procedure and under the influence of no sedative(s), amnesic(s), or anxiolytics, the patient was informed of the treatment options, risks and possible complications. To fulfill our ethical and legal obligations, as recommended by the American Medical Association's Code of Ethics, I have informed the patient of my clinical impression; the nature and purpose of the treatment or procedure; the risks, benefits, and possible complications of the intervention; the alternatives, including doing nothing; the risk(s) and benefit(s) of the alternative treatment(s) or procedure(s); and the risk(s) and benefit(s) of doing nothing. The patient was provided information about the general risks and possible complications associated with the procedure. These may include, but are not limited to: failure to achieve desired goals,  infection, bleeding, organ or nerve damage, allergic reactions, paralysis, and death. In addition, the patient was informed of those risks and  complications associated to the procedure, such as failure to decrease pain; infection; bleeding; organ or nerve damage with subsequent damage to sensory, motor, and/or autonomic systems, resulting in permanent pain, numbness, and/or weakness of one or several areas of the body; allergic reactions; (i.e.: anaphylactic reaction); and/or death. Furthermore, the patient was informed of those risks and complications associated with the medications. These include, but are not limited to: allergic reactions (i.e.: anaphylactic or anaphylactoid reaction(s)); adrenal axis suppression; blood sugar elevation that in diabetics may result in ketoacidosis or comma; water retention that in patients with history of congestive heart failure may result in shortness of breath, pulmonary edema, and decompensation with resultant heart failure; weight gain; swelling or edema; medication-induced neural toxicity; particulate matter embolism and blood vessel occlusion with resultant organ, and/or nervous system infarction; and/or aseptic necrosis of one or more joints. Finally, the patient was informed that Medicine is not an exact science; therefore, there is also the possibility of unforeseen or unpredictable risks and/or possible complications that may result in a catastrophic outcome. The patient indicated having understood very clearly. We have given the patient no guarantees and we have made no promises. Enough time was given to the patient to ask questions, all of which were answered to the patient's satisfaction. Patrick Moyer has indicated that he wanted to continue with the procedure. Attestation: I, the ordering provider, attest that I have discussed with the patient the benefits, risks, side-effects, alternatives, likelihood of achieving goals, and potential problems during recovery for  the procedure that I have provided informed consent. Date  Time: 07/18/2021  9:35 AM  Pre-Procedure Preparation:  Monitoring: As per clinic protocol. Respiration, ETCO2, SpO2, BP, heart rate and rhythm monitor placed and checked for adequate function Safety Precautions: Patient was assessed for positional comfort and pressure points before starting the procedure. Time-out: I initiated and conducted the "Time-out" before starting the procedure, as per protocol. The patient was asked to participate by confirming the accuracy of the "Time Out" information. Verification of the correct person, site, and procedure were performed and confirmed by me, the nursing staff, and the patient. "Time-out" conducted as per Joint Commission's Universal Protocol (UP.01.01.01). Time: 1028  Description of Procedure:          Target Area: 1 cm deep, 1 cm lateral, 1 cm inferior to the Inferior, posterior, aspect of the sacroiliac fissure Approach: Posterior, paraspinal, ipsilateral approach. Area Prepped: Entire Lower Lumbosacral Region DuraPrep (Iodine Povacrylex [0.7% available iodine] and Isopropyl Alcohol, 74% w/w) Safety Precautions: Aspiration looking for blood return was conducted prior to all injections. At no point did we inject any substances, as a needle was being advanced. No attempts were made at seeking any paresthesias. Safe injection practices and needle disposal techniques used. Medications properly checked for expiration dates. SDV (single dose vial) medications used. Description of the Procedure: Protocol guidelines were followed. The patient was placed in position over the procedure table. The target area was identified and the area prepped in the usual manner. Skin & deeper tissues infiltrated with local anesthetic. Appropriate amount of time allowed to pass for local anesthetics to take effect. The procedure needle was advanced under fluoroscopic guidance into the sacroiliac joint until a firm endpoint  was obtained. Proper needle placement secured. Negative aspiration confirmed. Solution injected in intermittent fashion, asking for systemic symptoms every 0.5cc of injectate. The needles were then removed and the area cleansed, making sure to leave some of the prepping solution back to take advantage of its long term  bactericidal properties. Vitals:   07/18/21 0957 07/18/21 1030 07/18/21 1032  BP: 128/73 138/79 136/87  Pulse: 60    Resp:  14 20  Temp: (!) 97.1 F (36.2 C)    SpO2: 99% 96% 98%  Weight: 199 lb (90.3 kg)    Height: 6\' 4"  (1.93 m)       Start Time: 1028 hrs. End Time: 1031 hrs. Materials:  Needle(s) Type: Spinal Needle Gauge: 25G Length: 3.5-in Medication(s): Please see orders for medications and dosing details.  5 cc solution made of 4 cc of 0.2% ropivacaine, 1 cc of Decadron 10 mg/cc.  Injected into the left piriformis muscle after contrast confirmation. Imaging Guidance (Non-Spinal):          Type of Imaging Technique: Fluoroscopy Guidance (Non-Spinal) Indication(s): Assistance in needle guidance and placement for procedures requiring needle placement in or near specific anatomical locations not easily accessible without such assistance. Exposure Time: Please see nurses notes. Contrast: Before injecting any contrast, we confirmed that the patient did not have an allergy to iodine, shellfish, or radiological contrast. Once satisfactory needle placement was completed at the desired level, radiological contrast was injected. Contrast injected under live fluoroscopy. No contrast complications. See chart for type and volume of contrast used. Fluoroscopic Guidance: I was personally present during the use of fluoroscopy. "Tunnel Vision Technique" used to obtain the best possible view of the target area. Parallax error corrected before commencing the procedure. "Direction-depth-direction" technique used to introduce the needle under continuous pulsed fluoroscopy. Once target was  reached, antero-posterior, oblique, and lateral fluoroscopic projection used confirm needle placement in all planes. Images permanently stored in EMR. Interpretation: I personally interpreted the imaging intraoperatively. Adequate needle placement confirmed in multiple planes. Appropriate spread of contrast into desired area was observed. No evidence of afferent or efferent intravascular uptake. Permanent images saved into the patient's record.  Post-operative Assessment:  Post-procedure Vital Signs:  Pulse/HCG Rate: 60(!) 57 Temp: (!) 97.1 F (36.2 C) Resp: 20 BP: 136/87 SpO2: 98 %  EBL: None  Complications: No immediate post-treatment complications observed by team, or reported by patient.  Note: The patient tolerated the entire procedure well. A repeat set of vitals were taken after the procedure and the patient was kept under observation following institutional policy, for this type of procedure. Post-procedural neurological assessment was performed, showing return to baseline, prior to discharge. The patient was provided with post-procedure discharge instructions, including a section on how to identify potential problems. Should any problems arise concerning this procedure, the patient was given instructions to immediately contact , at any time, without hesitation. In any case, we plan to contact the patient by telephone for a follow-up status report regarding this interventional procedure.  Comments:  No additional relevant information.  Plan of Care  Orders:  Orders Placed This Encounter  Procedures   DG PAIN CLINIC C-ARM 1-60 MIN NO REPORT    Intraoperative interpretation by procedural physician at [facility].    Standing Status:   Standing    Number of Occurrences:   1    Order Specific Question:   Reason for exam:    Answer:   Assistance in needle guidance and placement for procedures requiring needle placement in or near specific anatomical locations not easily  accessible without such assistance.    Medications ordered for procedure: Meds ordered this encounter  Medications   iohexol (OMNIPAQUE) 180 MG/ML injection 10 mL    Must be Myelogram-compatible. If not available, you may substitute with a  water-soluble, non-ionic, hypoallergenic, myelogram-compatible radiological contrast medium.   lidocaine (XYLOCAINE) 2 % (with pres) injection 400 mg   dexamethasone (DECADRON) injection 10 mg   ropivacaine (PF) 2 mg/mL (0.2%) (NAROPIN) injection 4 mL    Medications administered: We administered iohexol, lidocaine, dexamethasone, and ropivacaine (PF) 2 mg/mL (0.2%).  See the medical record for exact dosing, route, and time of administration.  Follow-up plan:   Return if symptoms worsen or fail to improve.      LEFT L4/5 ESI 11/24/20: Not effective.  Left piriformis TPI #1 02/02/2021 (100% pain relief for 2 days),  #2 03/14/21, #3 04/18/21, #5 07/18/21     Recent Visits Date Type Provider Dept  05/18/21 Telemedicine Edward Jolly, MD [facility]  Showing recent visits within past 90 days and meeting all other requirements Today's Visits Date Type Provider Dept  07/18/21 Procedure visit Edward Jolly, MD [facility]  Showing today's visits and meeting all other requirements Future Appointments Date Type Provider Dept  08/18/21 Appointment Edward Jolly, MD [facility]  Showing future appointments within next 90 days and meeting all other requirements Disposition: Discharge home  Discharge (Date  Time): 07/18/2021; 1040 hrs.   Primary Care Physician: Daisy Floro, MD Location: [facility] Note by: Edward Jolly, MD Date: 07/18/2021; Time: 12:55 PM  Disclaimer:  Medicine is not an exact science. The only guarantee in medicine is that nothing is guaranteed. It is important to note that the decision to proceed with this intervention was based on the information collected from the patient.  The Data and conclusions were drawn from the patient's questionnaire, the interview, and the physical examination. Because the information was provided in large part by the patient, it cannot be guaranteed that it has not been purposely or unconsciously manipulated. Every effort has been made to obtain as much relevant data as possible for this evaluation. It is important to note that the conclusions that lead to this procedure are derived in large part from the available data. Always take into account that the treatment will also be dependent on availability of resources and existing treatment guidelines, considered by other Pain Management Practitioners as being common knowledge and practice, at the time of the intervention. For Medico-Legal purposes, it is also important to point out that variation in procedural techniques and pharmacological choices are the acceptable norm. The indications, contraindications, technique, and results of the above procedure should only be interpreted and judged by a Board-Certified Interventional Pain Specialist with extensive familiarity and expertise in the same exact procedure and technique.

## 2021-07-18 NOTE — Progress Notes (Signed)
Safety precautions to be maintained throughout the outpatient stay will include: orient to surroundings, keep bed in low position, maintain call bell within reach at all times, provide assistance with transfer out of bed and ambulation.  

## 2021-07-19 ENCOUNTER — Telehealth: Payer: Self-pay

## 2021-07-19 NOTE — Telephone Encounter (Signed)
Post procedure  phone call. Patient states he has no pain today.

## 2021-07-28 ENCOUNTER — Other Ambulatory Visit: Payer: Medicare PPO

## 2021-08-18 ENCOUNTER — Other Ambulatory Visit: Payer: Self-pay

## 2021-08-18 ENCOUNTER — Ambulatory Visit
Payer: Medicare PPO | Attending: Student in an Organized Health Care Education/Training Program | Admitting: Student in an Organized Health Care Education/Training Program

## 2021-08-18 DIAGNOSIS — G894 Chronic pain syndrome: Secondary | ICD-10-CM

## 2021-08-18 DIAGNOSIS — M533 Sacrococcygeal disorders, not elsewhere classified: Secondary | ICD-10-CM | POA: Diagnosis not present

## 2021-08-18 DIAGNOSIS — G5702 Lesion of sciatic nerve, left lower limb: Secondary | ICD-10-CM | POA: Diagnosis not present

## 2021-08-18 NOTE — Progress Notes (Signed)
Patient: Patrick Moyer  Service Category: E/M  Provider: Gillis Santa, MD  DOB: 11/13/1951  DOS: 08/18/2021  Location: Office  MRN: [id number]  Setting: Ambulatory outpatient  Referring Provider: Lawerance Cruel, MD  Type: Established Patient  Specialty: Interventional Pain Management  PCP: Lawerance Cruel, MD  Location: Home  Delivery: TeleHealth     Virtual Encounter - Pain Management PROVIDER NOTE: Information contained herein reflects review and annotations entered in association with encounter. Interpretation of such information and data should be left to medically-trained personnel. Information provided to patient can be located elsewhere in the medical record under "Patient Instructions". Document created using STT-dictation technology, any transcriptional errors that may result from process are unintentional.    Contact & Pharmacy Preferred: [phone] Home: [phone] (home) Mobile: [phone] (mobile) E-mail: [email or web address]  [id number], [place] [address] Phone: [phone] Fax: [phone]  [id number] [address] [place] [address] Phone: [phone] Fax: [phone]   Pre-screening  Patrick Moyer offered "in-person" vs "virtual" encounter. He indicated preferring virtual for this encounter.   Reason COVID-19*  Social distancing based on CDC and AMA recommendations.   I contacted Patrick Moyer on 08/18/2021 via video conference.      I clearly identified myself as Gillis Santa, MD. I verified that I was speaking with the correct person using two identifiers (Name: Patrick Moyer, and date of birth: September 25, 1951).  Consent I sought verbal advanced consent from Patrick Moyer for virtual visit interactions. I informed Patrick Moyer of possible security and privacy concerns, risks, and limitations associated with providing  "not-in-person" medical evaluation and management services. I also informed Patrick Moyer of the availability of "in-person" appointments. Finally, I informed him that there would be a charge for the virtual visit and that he could be  personally, fully or partially, financially responsible for it. Patrick Moyer expressed understanding and agreed to proceed.   Historic Elements   Patrick Moyer is a 70 y.o. year old, male patient evaluated today after our last contact on 07/18/2021. Patrick Moyer  has a past medical history of ALLERGIC RHINITIS, Arthritis pain of wrist, Bell's palsy, Benign localized prostatic hyperplasia with lower urinary tract symptoms (LUTS), Chronic bilateral low back pain with bilateral sciatica, Diverticulosis, ED (erectile dysfunction), Environmental and seasonal allergies, Family history of Marfan syndrome, GERD (gastroesophageal reflux disease), Hiatal hernia, High cholesterol, History of closed head injury, History of positive PPD (age 70), HSV-1 infection, Marfanoid habitus, Mild CAD, Non-celiac gluten sensitivity, Numbness and tingling in both hands, OSA on CPAP, Pigment dispersion syndrome, Polyneuropathy, Pulmonary nodules, Radiculopathy of leg, Thoracic ascending aortic aneurysm ([place]), and Wears glasses. He also  has a past surgical history that includes Nasal septum surgery (1975); Wisdom tooth extraction (1975); Finger exploration (Left, 1995); Laparoscopic appendectomy (2010); Inguinal hernia repair (Left, 1978); and Transurethral resection of prostate (N/A, 05/05/2020). Patrick Moyer has a current medication list which includes the following prescription(s): acetaminophen, acyclovir ointment, biotene dry mouth, b complex vitamins, ca carbonate-mag hydroxide, celecoxib, cholecalciferol, fexofenadine, fexofenadine, fiber, fluticasone, gabapentin, glucosamine-chondroitin, guaifenesin, l-lysine, montelukast, nystatin, omeprazole, rosuvastatin, valacyclovir hcl, folic acid, and methotrexate.  He  reports that he has never smoked. He has never used smokeless tobacco. He reports that he does not drink alcohol and does not use drugs. Patrick Moyer is allergic to other, cialis [tadalafil], rosuvastatin, sildenafil, and soy protein [soybean oil].   HPI  Today,  he is being contacted for a post-procedure assessment.   Post-Procedure Evaluation  Procedure (07/18/2021):   Type: Left piriformis trigger point injection #4 under fluoroscopic guidance Region: Inferior Lumbosacral Region Level: PIIS (Posterior Inferior Iliac Spine) 1 cm deep, 1 cm lateral and 1 cm inferior from posterior inferior fissure of SI joint. Laterality: Left-Side  Anxiolysis: Please see nurses note.  Effectiveness during initial hour after procedure (Ultra-Short Term Relief): 100 %   Local anesthetic used: Long-acting (4-6 hours) Effectiveness: Defined as any analgesic benefit obtained secondary to the administration of local anesthetics. This carries significant diagnostic value as to the etiological location, or anatomical origin, of the pain. Duration of benefit is expected to coincide with the duration of the local anesthetic used.  Effectiveness during initial 4-6 hours after procedure (Short-Term Relief): 100 %   Long-term benefit: Defined as any relief past the pharmacologic duration of the local anesthetics.  Effectiveness past the initial 6 hours after procedure (Long-Term Relief): 100 % (approx 1 week ago the pain has began to come back.  stretching helps very much with the return of pain.  Would like to repeat in approx 1 week.)   Benefits, current: Defined as benefit present at the time of this evaluation.   Analgesia:  20%    Laboratory Chemistry Profile   Renal Lab Results  Component Value Date   BUN 11 12/27/2020   CREATININE 0.70 (L) 12/27/2020   BCR 16 12/27/2020   GFRAA 112 12/27/2020   GFRNONAA 97 12/27/2020    Hepatic Lab Results  Component Value Date   AST 18 12/27/2020   ALT 14  12/27/2020   ALBUMIN 4.3 12/27/2020   ALKPHOS 65 12/27/2020    Electrolytes Lab Results  Component Value Date   NA 142 12/27/2020   K 4.1 12/27/2020   CL 107 (H) 12/27/2020   CALCIUM 9.1 12/27/2020    Bone No results found for: VD25OH, VD125OH2TOT, PV9480XK5, VV7482LM7, 25OHVITD1, 25OHVITD2, 25OHVITD3, TESTOFREE, TESTOSTERONE  Inflammation (CRP: Acute Phase) (ESR: Chronic Phase) No results found for: CRP, ESRSEDRATE, LATICACIDVEN       Note: Above Lab results reviewed.   Assessment  The primary encounter diagnosis was Piriformis syndrome of left side. Diagnoses of Sacroiliac joint pain and Chronic pain syndrome were also pertinent to this visit.  Plan of Care    Mr. Patrick Moyer has a current medication list which includes the following long-term medication(s): fexofenadine, fexofenadine, gabapentin, omeprazole, and rosuvastatin.  Return of pain, patient would like to repeat left piriformis injection.  States that it provides 4 to 6 weeks of pretty significant pain relief.  He is able to ambulate and participate in activities of daily living more comfortably after these.  He is currently at the beach and would like to get that scheduled in the next 2 weeks once he returns.  Orders:  Orders Placed This Encounter  Procedures   TRIGGER POINT INJECTION    Area: Buttocks region (gluteal area) Indications: Piriformis muscle pain;  LEFT piriformis-syndrome; piriformis muscle spasms (E67.544). CPT code: 20552    Standing Status:   Future    Standing Expiration Date:   08/18/2022    Scheduling Instructions:     Type: Myoneural block (TPI) of piriformis muscle.     Side:  LEFT     Sedation: Patient's choice.     Timeframe: Today    Order Specific Question:   Where will this procedure be performed?    Answer:   [facility]  Follow-up plan:   Return in about 2 weeks (around 09/01/2021) for Left piriformis TPI with fluoro , without sedation.     LEFT L4/5 ESI 11/24/20:  Not effective.  Left piriformis TPI #1 02/02/2021 (100% pain relief for 2 days),  #2 03/14/21, #3 04/18/21, #5 07/18/21      Recent Visits Date Type Provider Dept  07/18/21 Procedure visit Gillis Santa, MD [facility]  Showing recent visits within past 90 days and meeting all other requirements Today's Visits Date Type Provider Dept  08/18/21 Telemedicine Gillis Santa, MD [facility]  Showing today's visits and meeting all other requirements Future Appointments No visits were found meeting these conditions. Showing future appointments within next 90 days and meeting all other requirements I discussed the assessment and treatment plan with the patient. The patient was provided an opportunity to ask questions and all were answered. The patient agreed with the plan and demonstrated an understanding of the instructions.  Patient advised to call back or seek an in-person evaluation if the symptoms or condition worsens.  Duration of encounter: 32mnutes.  Note by: [name] Date: 08/18/2021; Time: 10:47 AM

## 2021-09-05 ENCOUNTER — Ambulatory Visit
Admission: RE | Admit: 2021-09-05 | Discharge: 2021-09-05 | Disposition: A | Discharge status: Home / Self Care | Payer: Medicare PPO | Source: Ambulatory Visit | Attending: Student in an Organized Health Care Education/Training Program | Admitting: Student in an Organized Health Care Education/Training Program

## 2021-09-05 ENCOUNTER — Encounter: Payer: Self-pay | Admitting: Student in an Organized Health Care Education/Training Program

## 2021-09-05 ENCOUNTER — Ambulatory Visit (HOSPITAL_BASED_OUTPATIENT_CLINIC_OR_DEPARTMENT_OTHER): Payer: Medicare PPO | Admitting: Student in an Organized Health Care Education/Training Program

## 2021-09-05 ENCOUNTER — Other Ambulatory Visit: Payer: Self-pay

## 2021-09-05 VITALS — BP 140/88 | HR 60 | Temp 97.4°F | Resp 16 | Ht 75.0 in | Wt 199.0 lb

## 2021-09-05 DIAGNOSIS — G5702 Lesion of sciatic nerve, left lower limb: Secondary | ICD-10-CM

## 2021-09-05 DIAGNOSIS — G894 Chronic pain syndrome: Secondary | ICD-10-CM

## 2021-09-05 MED ORDER — LIDOCAINE HCL 2 % IJ SOLN
20.0000 mL | Freq: Once | INTRAMUSCULAR | Status: AC
  Administered 2021-09-05: 100 mg

## 2021-09-05 MED ORDER — ROPIVACAINE HCL 2 MG/ML IJ SOLN
9.0000 mL | Freq: Once | INTRAMUSCULAR | Status: AC
  Administered 2021-09-05: 9 mL via PERINEURAL

## 2021-09-05 MED ORDER — DEXAMETHASONE SODIUM PHOSPHATE 10 MG/ML IJ SOLN
10.0000 mg | Freq: Once | INTRAMUSCULAR | Status: AC
  Administered 2021-09-05: 10 mg
  Filled 2021-09-05: qty 1

## 2021-09-05 MED ORDER — IOHEXOL 180 MG/ML  SOLN
10.0000 mL | Freq: Once | INTRAMUSCULAR | Status: AC
  Administered 2021-09-05: 5 mL via INTRA_ARTICULAR

## 2021-09-05 NOTE — Progress Notes (Signed)
Safety precautions to be maintained throughout the outpatient stay will include: orient to surroundings, keep bed in low position, maintain call bell within reach at all times, provide assistance with transfer out of bed and ambulation.  

## 2021-09-05 NOTE — Progress Notes (Signed)
PROVIDER NOTE: Information contained herein reflects review and annotations entered in association with encounter. Interpretation of such information and data should be left to medically-trained personnel. Information provided to patient can be located elsewhere in the medical record under "Patient Instructions". Document created using STT-dictation technology, any transcriptional errors that may result from process are unintentional.    Patient: Patrick Moyer  Service Category: Procedure  Provider: Edward Jolly, MD  DOB: 1951/05/09  DOS: 09/05/2021  Location: [facility]  MRN: [id number]  Setting: Ambulatory - outpatient  Referring Provider: Daisy Floro, MD  Type: Established Patient  Specialty: Interventional Pain Management  PCP: Daisy Floro, MD   Primary Reason for Visit: Interventional Pain Management Treatment. CC: Other (Left pyriformis )  Procedure:          Anesthesia, Analgesia, Anxiolysis:  Type: Left piriformis trigger point injection #4 under fluoroscopic guidance Region: Inferior Lumbosacral Region Level: PIIS (Posterior Inferior Iliac Spine) 1 cm deep, 1 cm lateral and 1 cm inferior from posterior inferior fissure of SI joint. Laterality: Left-Side    Position: Prone           Indications: 1. Piriformis syndrome of left side   2. Chronic pain syndrome      Pain Score: Pre-procedure: 3/10 Post-procedure: 3 /10   Pre-op H&P Assessment:  Patrick Moyer is a 70 y.o. (year old), male patient, seen today for interventional treatment. He  has a past surgical history that includes Nasal septum surgery (1975); Wisdom tooth extraction (1975); Finger exploration (Left, 1995); Laparoscopic appendectomy (2010); Inguinal hernia repair (Left, 1978); and Transurethral resection of prostate (N/A, 05/05/2020). Patrick Moyer has a current medication list which includes the following prescription(s): acetaminophen, acyclovir ointment, biotene dry mouth, b complex  vitamins, ca carbonate-mag hydroxide, celecoxib, cholecalciferol, fexofenadine, fiber, fluticasone, gabapentin, glucosamine-chondroitin, guaifenesin, l-lysine, montelukast, nystatin, omeprazole, rosuvastatin, valacyclovir hcl, fexofenadine, folic acid, and methotrexate. His primarily concern today is the Other (Left pyriformis )  Initial Vital Signs:  Pulse/HCG Rate: (!) 59  Temp: (!) 97.4 F (36.3 C) Resp: 16 BP: 135/84 SpO2: 100 %  BMI: Estimated body mass index is 24.87 kg/m as calculated from the following:   Height as of this encounter: 6\' 3"  (1.905 m).   Weight as of this encounter: 199 lb (90.3 kg).  Risk Assessment: Allergies: Reviewed. He is allergic to other, cialis [tadalafil], rosuvastatin, sildenafil, and soy protein [soybean oil].  Allergy Precautions: None required Coagulopathies: Reviewed. None identified.  Blood-thinner therapy: None at this time Active Infection(s): Reviewed. None identified. Patrick Moyer is afebrile  Site Confirmation: Patrick Moyer was asked to confirm the procedure and laterality before marking the site Procedure checklist: Completed Consent: Before the procedure and under the influence of no sedative(s), amnesic(s), or anxiolytics, the patient was informed of the treatment options, risks and possible complications. To fulfill our ethical and legal obligations, as recommended by the American Medical Association's Code of Ethics, I have informed the patient of my clinical impression; the nature and purpose of the treatment or procedure; the risks, benefits, and possible complications of the intervention; the alternatives, including doing nothing; the risk(s) and benefit(s) of the alternative treatment(s) or procedure(s); and the risk(s) and benefit(s) of doing nothing. The patient was provided information about the general risks and possible complications associated with the procedure. These may include, but are not limited to: failure to achieve desired  goals, infection, bleeding, organ or nerve damage, allergic reactions, paralysis, and death. In addition, the patient was informed of those  risks and complications associated to the procedure, such as failure to decrease pain; infection; bleeding; organ or nerve damage with subsequent damage to sensory, motor, and/or autonomic systems, resulting in permanent pain, numbness, and/or weakness of one or several areas of the body; allergic reactions; (i.e.: anaphylactic reaction); and/or death. Furthermore, the patient was informed of those risks and complications associated with the medications. These include, but are not limited to: allergic reactions (i.e.: anaphylactic or anaphylactoid reaction(s)); adrenal axis suppression; blood sugar elevation that in diabetics may result in ketoacidosis or comma; water retention that in patients with history of congestive heart failure may result in shortness of breath, pulmonary edema, and decompensation with resultant heart failure; weight gain; swelling or edema; medication-induced neural toxicity; particulate matter embolism and blood vessel occlusion with resultant organ, and/or nervous system infarction; and/or aseptic necrosis of one or more joints. Finally, the patient was informed that Medicine is not an exact science; therefore, there is also the possibility of unforeseen or unpredictable risks and/or possible complications that may result in a catastrophic outcome. The patient indicated having understood very clearly. We have given the patient no guarantees and we have made no promises. Enough time was given to the patient to ask questions, all of which were answered to the patient's satisfaction. Patrick Moyer has indicated that he wanted to continue with the procedure. Attestation: I, the ordering provider, attest that I have discussed with the patient the benefits, risks, side-effects, alternatives, likelihood of achieving goals, and potential problems during  recovery for the procedure that I have provided informed consent. Date  Time: 09/05/2021 11:07 AM  Pre-Procedure Preparation:  Monitoring: As per clinic protocol. Respiration, ETCO2, SpO2, BP, heart rate and rhythm monitor placed and checked for adequate function Safety Precautions: Patient was assessed for positional comfort and pressure points before starting the procedure. Time-out: I initiated and conducted the "Time-out" before starting the procedure, as per protocol. The patient was asked to participate by confirming the accuracy of the "Time Out" information. Verification of the correct person, site, and procedure were performed and confirmed by me, the nursing staff, and the patient. "Time-out" conducted as per Joint Commission's Universal Protocol (UP.01.01.01). Time: 1131  Description of Procedure:          Target Area: 1 cm deep, 1 cm lateral, 1 cm inferior to the Inferior, posterior, aspect of the sacroiliac fissure Approach: Posterior, paraspinal, ipsilateral approach. Area Prepped: Entire Lower Lumbosacral Region DuraPrep (Iodine Povacrylex [0.7% available iodine] and Isopropyl Alcohol, 74% w/w) Safety Precautions: Aspiration looking for blood return was conducted prior to all injections. At no point did we inject any substances, as a needle was being advanced. No attempts were made at seeking any paresthesias. Safe injection practices and needle disposal techniques used. Medications properly checked for expiration dates. SDV (single dose vial) medications used. Description of the Procedure: Protocol guidelines were followed. The patient was placed in position over the procedure table. The target area was identified and the area prepped in the usual manner. Skin & deeper tissues infiltrated with local anesthetic. Appropriate amount of time allowed to pass for local anesthetics to take effect. The procedure needle was advanced under fluoroscopic guidance into the sacroiliac joint until a  firm endpoint was obtained. Proper needle placement secured. Negative aspiration confirmed. Solution injected in intermittent fashion, asking for systemic symptoms every 0.5cc of injectate. The needles were then removed and the area cleansed, making sure to leave some of the prepping solution back to take advantage of its long  term bactericidal properties. Vitals:   09/05/21 1113 09/05/21 1129 09/05/21 1134  BP: 135/84 130/81 140/88  Pulse: (!) 59 (!) 58 60  Resp: 16 18 16   Temp: (!) 97.4 F (36.3 C)    TempSrc: Temporal    SpO2: 100% 95% 95%  Weight: 199 lb (90.3 kg)    Height: 6\' 3"  (1.905 m)        Start Time: 1131 hrs. End Time: 1134 hrs. Materials:  Needle(s) Type: Spinal Needle Gauge: 25G Length: 3.5-in Medication(s): Please see orders for medications and dosing details.  5 cc solution made of 4 cc of 0.2% ropivacaine, 1 cc of Decadron 10 mg/cc.  Injected into the left piriformis muscle after contrast confirmation. Imaging Guidance (Non-Spinal):          Type of Imaging Technique: Fluoroscopy Guidance (Non-Spinal) Indication(s): Assistance in needle guidance and placement for procedures requiring needle placement in or near specific anatomical locations not easily accessible without such assistance. Exposure Time: Please see nurses notes. Contrast: Before injecting any contrast, we confirmed that the patient did not have an allergy to iodine, shellfish, or radiological contrast. Once satisfactory needle placement was completed at the desired level, radiological contrast was injected. Contrast injected under live fluoroscopy. No contrast complications. See chart for type and volume of contrast used. Fluoroscopic Guidance: I was personally present during the use of fluoroscopy. "Tunnel Vision Technique" used to obtain the best possible view of the target area. Parallax error corrected before commencing the procedure. "Direction-depth-direction" technique used to introduce the needle  under continuous pulsed fluoroscopy. Once target was reached, antero-posterior, oblique, and lateral fluoroscopic projection used confirm needle placement in all planes. Images permanently stored in EMR. Interpretation: I personally interpreted the imaging intraoperatively. Adequate needle placement confirmed in multiple planes. Appropriate spread of contrast into desired area was observed. No evidence of afferent or efferent intravascular uptake. Permanent images saved into the patient's record.  Post-operative Assessment:  Post-procedure Vital Signs:  Pulse/HCG Rate: 60  Temp: (!) 97.4 F (36.3 C) Resp: 16 BP: 140/88 SpO2: 95 %  EBL: None  Complications: No immediate post-treatment complications observed by team, or reported by patient.  Note: The patient tolerated the entire procedure well. A repeat set of vitals were taken after the procedure and the patient was kept under observation following institutional policy, for this type of procedure. Post-procedural neurological assessment was performed, showing return to baseline, prior to discharge. The patient was provided with post-procedure discharge instructions, including a section on how to identify potential problems. Should any problems arise concerning this procedure, the patient was given instructions to immediately contact , at any time, without hesitation. In any case, we plan to contact the patient by telephone for a follow-up status report regarding this interventional procedure.  Comments:  No additional relevant information.  Plan of Care  Orders:  Orders Placed This Encounter  Procedures   DG PAIN CLINIC C-ARM 1-60 MIN NO REPORT    Intraoperative interpretation by procedural physician at [facility].    Standing Status:   Standing    Number of Occurrences:   1    Order Specific Question:   Reason for exam:    Answer:   Assistance in needle guidance and placement for procedures requiring needle placement in or  near specific anatomical locations not easily accessible without such assistance.     Medications ordered for procedure: Meds ordered this encounter  Medications   iohexol (OMNIPAQUE) 180 MG/ML injection 10 mL    Must  be Myelogram-compatible. If not available, you may substitute with a water-soluble, non-ionic, hypoallergenic, myelogram-compatible radiological contrast medium.   lidocaine (XYLOCAINE) 2 % (with pres) injection 400 mg   dexamethasone (DECADRON) injection 10 mg   ropivacaine (PF) 2 mg/mL (0.2%) (NAROPIN) injection 9 mL     Medications administered: We administered iohexol, lidocaine, dexamethasone, and ropivacaine (PF) 2 mg/mL (0.2%).  See the medical record for exact dosing, route, and time of administration.  Follow-up plan:   Return if symptoms worsen or fail to improve.      LEFT L4/5 ESI 11/24/20: Not effective.  Left piriformis TPI #1 02/02/2021 (100% pain relief for 2 days),  #2 03/14/21, #3 04/18/21, #5 07/18/21, #6 09/05/21    Recent Visits Date Type Provider Dept  08/18/21 Telemedicine Edward Jolly, MD [facility]  07/18/21 Procedure visit Edward Jolly, MD [facility]  Showing recent visits within past 90 days and meeting all other requirements Today's Visits Date Type Provider Dept  09/05/21 Procedure visit Edward Jolly, MD [facility]  Showing today's visits and meeting all other requirements Future Appointments No visits were found meeting these conditions. Showing future appointments within next 90 days and meeting all other requirements Disposition: Discharge home  Discharge (Date  Time): 09/05/2021; 1145 hrs.   Primary Care Physician: Daisy Floro, MD Location: [facility] Note by: Edward Jolly, MD Date: 09/05/2021; Time: 11:46 AM  Disclaimer:  Medicine is not an exact science. The only guarantee in medicine is that nothing is guaranteed. It is important to note that the decision to  proceed with this intervention was based on the information collected from the patient. The Data and conclusions were drawn from the patient's questionnaire, the interview, and the physical examination. Because the information was provided in large part by the patient, it cannot be guaranteed that it has not been purposely or unconsciously manipulated. Every effort has been made to obtain as much relevant data as possible for this evaluation. It is important to note that the conclusions that lead to this procedure are derived in large part from the available data. Always take into account that the treatment will also be dependent on availability of resources and existing treatment guidelines, considered by other Pain Management Practitioners as being common knowledge and practice, at the time of the intervention. For Medico-Legal purposes, it is also important to point out that variation in procedural techniques and pharmacological choices are the acceptable norm. The indications, contraindications, technique, and results of the above procedure should only be interpreted and judged by a Board-Certified Interventional Pain Specialist with extensive familiarity and expertise in the same exact procedure and technique.

## 2021-09-05 NOTE — Patient Instructions (Signed)
Pain Management Discharge Instructions ? ?General Discharge Instructions : ? ?If you need to reach your doctor call: Monday-Friday 8:00 am - 4:00 pm at [phone] or toll free [phone].  After clinic hours [phone] to have operator reach doctor. ? ?Bring all of your medication bottles to all your appointments in the pain clinic. ? ?To cancel or reschedule your appointment with Pain Management please remember to call 24 hours in advance to avoid a fee. ? ?Refer to the educational materials which you have been given on: General Risks, I had my Procedure. Discharge Instructions, Post Sedation. ? ?Post Procedure Instructions: ? ?The drugs you were given will stay in your system until tomorrow, so for the next 24 hours you should not drive, make any legal decisions or drink any alcoholic beverages. ? ?You may eat anything you prefer, but it is better to start with liquids then soups and crackers, and gradually work up to solid foods. ? ?Please notify your doctor immediately if you have any unusual bleeding, trouble breathing or pain that is not related to your normal pain. ? ?Depending on the type of procedure that was done, some parts of your body may feel week and/or numb.  This usually clears up by tonight or the next day. ? ?Walk with the use of an assistive device or accompanied by an adult for the 24 hours. ? ?You may use ice on the affected area for the first 24 hours.  Put ice in a Ziploc bag and cover with a towel and place against area 15 minutes on 15 minutes off.  You may switch to heat after 24 hours.Trigger Point Injection ?Trigger points are areas where you have pain. A trigger point injection is a shot given in the trigger point to help relieve pain for a few days to a few months. Common places for trigger points include the neck, shoulders, upper back, or lower back. ?A trigger point injection will not cure long-term (chronic) pain permanently. These injections do not always work for every  person. For some people, they can help to relieve pain for a few days to a few months. ?Tell a health care provider about: ?Any allergies you have. ?All medicines you are taking, including vitamins, herbs, eye drops, creams, and over-the-counter medicines. ?Any problems you or family members have had with anesthetic medicines. ?Any bleeding problems you have. ?Any surgeries you have had. ?Any medical conditions you have. ?Whether you are pregnant or may be pregnant. ?What are the risks? ?Generally, this is a safe procedure. However, problems may occur, including: ?Infection. ?Bleeding or bruising. ?Allergic reaction to the injected medicine. ?Irritation of the skin around the injection site. ?What happens before the procedure? ?Ask your health care provider about: ?Changing or stopping your regular medicines. This is especially important if you are taking diabetes medicines or blood thinners. ?Taking medicines such as aspirin and ibuprofen. These medicines can thin your blood. Do not take these medicines unless your health care provider tells you to take them. ?Taking over-the-counter medicines, vitamins, herbs, and supplements. ?What happens during the procedure? ? ?Your health care provider will feel for trigger points. A marker may be used to circle the area for the injection. ?The skin over the trigger point will be washed with a germ-killing soap. ?You may be given a medicine to help you relax (sedative). ?A thin needle is used for the injection. You may feel pain or a twitching feeling when the needle enters your skin. ?A numbing solution may be injected   into the trigger point. Sometimes a medicine to keep down inflammation is also injected. ?Your health care provider may move the needle around the area where the trigger point is located until the tightness and twitching goes away. ?After the injection, your health care provider may put gentle pressure over the injection site. ?The injection site will be  covered with a bandage (dressing). ?The procedure may vary among health care providers and hospitals. ?What can I expect after treatment? ?After treatment, you may have soreness and stiffness for 1-2 days. ?Follow these instructions at home: ?Injection site care ?Remove your dressing in a few hours, or as told by your health care provider. ?Check your injection site every day for signs of infection. Check for: ?Redness, swelling, or pain. ?Fluid or blood. ?Warmth. ?Pus or a bad smell. ?Managing pain, stiffness, and swelling ?If directed, put ice on the affected area. To do this: ?Put ice in a plastic bag. ?Place a towel between your skin and the bag. ?Leave the ice on for 20 minutes, 2-3 times a day. ?Remove the ice if your skin turns bright red. This is very important. If you cannot feel pain, heat, or cold, you have a greater risk of damage to the area. ?Activity ?If you were given a sedative during the procedure, it can affect you for several hours. Do not drive or operate machinery until your health care provider says that it is safe. ?Do not take baths, swim, or use a hot tub until your health care provider approves. ?Return to your normal activities as told by your health care provider. Ask your health care provider what activities are safe for you. ?General instructions ?If you were asked to stop your regular medicines, ask your health care provider when you may start taking them again. ?You may be asked to see an occupational or physical therapist for exercises that reduce muscle strain and stretch the area of the trigger point. ?Keep all follow-up visits. This is important. ?Contact a health care provider if: ?Your pain comes back, and it is worse than before the injection. You may need more injections. ?You have chills or a fever. ?The injection site becomes more painful, red, swollen, or warm to the touch. ?Summary ?A trigger point injection is a shot given in the trigger point to help relieve  pain. ?Common places for trigger point injections are the neck, shoulders, upper back, and lower back. ?These injections do not always work for every person, but for some people, the injections can help to relieve pain for a few days to a few months. ?Contact a health care provider if symptoms come back or if they are worse than before treatment. Also, get help if the injection site becomes more painful, red, swollen, or warm to the touch. ?This information is not intended to replace advice given to you by your health care provider. Make sure you discuss any questions you have with your health care provider. ?Document Revised: 03/22/2021 Document Reviewed: 03/22/2021 ?Elsevier Patient Education ? 2022 Elsevier Inc. ? ?

## 2021-09-06 ENCOUNTER — Telehealth: Payer: Self-pay | Admitting: *Deleted

## 2021-09-06 NOTE — Telephone Encounter (Signed)
Denies any post procedure issues. 

## 2021-09-16 ENCOUNTER — Other Ambulatory Visit: Payer: Self-pay

## 2021-09-16 ENCOUNTER — Encounter (HOSPITAL_BASED_OUTPATIENT_CLINIC_OR_DEPARTMENT_OTHER): Payer: Self-pay | Admitting: Family

## 2021-09-16 ENCOUNTER — Ambulatory Visit (INDEPENDENT_AMBULATORY_CARE_PROVIDER_SITE_OTHER): Payer: Medicare PPO | Admitting: Family

## 2021-09-16 VITALS — BP 124/80 | HR 57 | Ht 75.0 in | Wt 198.4 lb

## 2021-09-16 DIAGNOSIS — I7121 Aneurysm of the ascending aorta, without rupture: Secondary | ICD-10-CM

## 2021-09-16 DIAGNOSIS — I712 Thoracic aortic aneurysm, without rupture: Secondary | ICD-10-CM

## 2021-09-16 DIAGNOSIS — I2510 Atherosclerotic heart disease of native coronary artery without angina pectoris: Secondary | ICD-10-CM | POA: Diagnosis not present

## 2021-09-16 DIAGNOSIS — E785 Hyperlipidemia, unspecified: Secondary | ICD-10-CM

## 2021-09-16 NOTE — Patient Instructions (Signed)
Medication Instructions:  Continue your current medications.   *If you need a refill on your cardiac medications before your next appointment, please call your pharmacy*   Lab Work: None ordered today. Cholesterol numbers in July were at goal!  Testing/Procedures: Your CTA is scheduled for 12/2021.   Follow-Up: At [facility], you and your health needs are our priority.  As part of our continuing mission to provide you with exceptional heart care, we have created designated Provider Care Teams.  These Care Teams include your primary Cardiologist (physician) and Advanced Practice Providers (APPs -  Physician Assistants and Nurse Practitioners) who all work together to provide you with the care you need, when you need it.  We recommend signing up for the patient portal called "MyChart".  Sign up information is provided on this After Visit Summary.  MyChart is used to connect with patients for Virtual Visits (Telemedicine).  Patients are able to view lab/test results, encounter notes, upcoming appointments, etc.  Non-urgent messages can be sent to your provider as well.   To learn more about what you can do with MyChart, go to [email or web address].    Your next appointment:   1 year(s)  The format for your next appointment:   In Person  Provider:   You may see Meriam Sprague, MD or one of the following Advanced Practice Providers on your designated Care Team:   Tereso Newcomer, PA-C [place], [place]  Other Instructions  Alver Sorrow, NP will fax note and letter to life insurance as well as your personal fax.   Heart Healthy Diet Recommendations: A low-salt diet is recommended. Meats should be grilled, baked, or boiled. Avoid fried foods. Focus on lean protein sources like fish or chicken with vegetables and fruits. The American Heart Association is a Chief Technology Officer!   Exercise recommendations: The American Heart Association recommends 150 minutes of moderate intensity  exercise weekly. Try 30 minutes of moderate intensity exercise 4-5 times per week. This could include walking, jogging, or swimming.  Keep up the great work with your swimming.

## 2021-09-16 NOTE — Progress Notes (Signed)
Office Visit    Patient Name: Patrick Moyer Date of Encounter: 09/16/2021  PCP:  Daisy Floro, MD   [facility]  Cardiologist:  Meriam Sprague, MD  Advanced Practice Provider:  No care team member to display Electrophysiologist:  None   Chief Complaint    Patrick Moyer is a 70 y.o. male with a hx of ascending aortic aneurysm, nonobstructive coronary artery disease, marfanoid habitus, hyperlipidemia presents today for follow up of CAD.  Past Medical History    Past Medical History:  Diagnosis Date   ALLERGIC RHINITIS    Arthritis pain of wrist    Bell's palsy    per pt 1987 left side, 20% residual effect's eyelid   Benign localized prostatic hyperplasia with lower urinary tract symptoms (LUTS)    Chronic bilateral low back pain with bilateral sciatica    Diverticulosis    ED (erectile dysfunction)    Environmental and seasonal allergies    Family history of Marfan syndrome    father, paternal uncle, and brother   GERD (gastroesophageal reflux disease)    Hiatal hernia    High cholesterol    History of closed head injury    04-28-2020 per pt yrs ago w/ no loc, residual mild memory loss which has resolved   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 43   per pt treated with INH for one year and cxr's since have been normal   HSV-1 infection    Marfanoid habitus    per pt has wing span is longer than his height, effects eyes (iris), hyperflexibility of ankle and hands,  AAA   Mild CAD    cardiologist--  dr Delton See---  CTA 10-22-2019   Non-celiac gluten sensitivity    Numbness and tingling in both hands    per pt intermittantly due to cervical spine   OSA on CPAP    auto 8-10,  pt stated uses every night   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)   Polyneuropathy    left leg, lumbar nerve    Pulmonary nodules    per pt followed by pcp (CTA and CT chest 10-22-2019 , stable)   Radiculopathy of leg     Thoracic ascending aortic aneurysm [facility])    cardiologist--- dr Eloy End--- CTA 10-22-2019  ascending aorta 39mm and aortic root 28mm   Wears glasses    Past Surgical History:  Procedure Laterality Date   FINGER EXPLORATION Left 1995   left middle finger digital nerve repair   INGUINAL HERNIA REPAIR Left 1978   LAPAROSCOPIC APPENDECTOMY  2010   NASAL SEPTUM SURGERY  1975   TRANSURETHRAL RESECTION OF PROSTATE N/A 05/05/2020   Procedure: TRANSURETHRAL RESECTION OF THE PROSTATE (TURP), BIPOLAR;  Surgeon: Rene Paci, MD;  Location: [facility];  Service: Urology;  Laterality: N/A;   WISDOM TOOTH EXTRACTION  1975    Allergies  Allergies  Allergen Reactions   Other     Adhesive, Caffene-abd pain   Cialis [Tadalafil]     Caused acute angle glaucoma   Rosuvastatin Other (See Comments)    Causes joint aches   Sildenafil     Caused acute angle glaucoma   Soy Protein [Soybean Oil]     Gastrointestinal upset    History of Present Illness    Patrick Moyer is a 70 y.o. male with a hx of  ascending aortic aneurysm, nonobstructive coronary artery  disease, marfanoid habitus, hyperlipidemia  last seen via telemedicine by pharmacist 06/01/2021.  Family history significant for Marfan syndrome.  His brother was diagnosed with Marfan syndrome, father died of ascending aortic dissection and coronary dissection, daughter is unusually tall with long arm span than body legs when she was growing up.  Previous coronary CTA 10/14/2019 with coronary calcium score 57 placing him in the 41st percentile for age and sex matched control.  He had mildly dilated aortic root at the sinus level with maximum diameter 43 mm.  He was last seen by Dr. Delton See 12/26/2020 doing well from a cardiac perspective with well-controlled blood pressure and swimming 4 to 6 miles per week.  03/16/21 ALT 18, AST 21.  05/11/21 total cholesterol 145, HDL 55, LDL 83, triglycerides 35 07/08/21 LDL 67.   He  presents today for follow up. Tells me he is applying for life insurance and requires documentation as to whether or not he requires ischemic evaluation. Shares with me that he enjoys swimming for exercise. He swam 2000 meters this morning without difficulty. He has gradually been increasing his swimming since knee surgery. He is working on getting a Actuary. Reports no shortness of breath nor dyspnea on exertion. Reports no chest pain, pressure, or tightness. No edema, orthopnea, PND. Reports no palpitations.  Denies lightheadedness, dizziness, near syncope, syncope. Tells me his methotrexate on hold due to recurrent ear infections leading to some hearing loss which is improving. He tolerates Rosuvastatin 2-3 times per week and his cholesterol was checked in July and under optimal control.   EKGs/Labs/Other Studies Reviewed:   The following studies were reviewed today:  Coronary CTA: 10/22/2019   1. Coronary calcium score of 57. This was 20 percentile for age and sex matched control.   2. Normal coronary origin with right dominance.   3. CAD-RADS 1. Minimal non-obstructive CAD (0-24%) in the proximal LAD. Consider non-atherosclerotic causes of chest pain. Consider preventive therapy and risk factor modification.   4. Mildly dilated aortic root at the sinus level with maximum diameter 43 mm, upper normal size of the ascending aorta measuring 40 mm. When compared to the prior study from 05/07/2018 there has  EKG:  EKG is ordered today.  The ekg ordered today demonstrates sinus bradycardia 57 bpm with no acute ST/T wave changes.   Recent Labs: 12/27/2020: ALT 14; BUN 11; Creatinine, Ser 0.70; Potassium 4.1; Sodium 142  Recent Lipid Panel    Component Value Date/Time   CHOL 174 12/27/2020 1216   TRIG 69 12/27/2020 1216   HDL 49 12/27/2020 1216   CHOLHDL 3.6 12/27/2020 1216   LDLCALC 112 (H) 12/27/2020 1216   Home Medications   Current Meds  Medication Sig   acetaminophen  (TYLENOL) 500 MG tablet Take 500 mg by mouth at bedtime.   acyclovir ointment (ZOVIRAX) 5 % Apply 1 application topically every 3 (three) hours.    Artificial Saliva (BIOTENE DRY MOUTH) LOZG Use as directed in the mouth or throat.   B Complex Vitamins (B COMPLEX 100 PO) Take 1 tablet by mouth in the morning and at bedtime.   Ca Carbonate-Mag Hydroxide (ROLAIDS PO) Take by mouth as needed.   celecoxib (CELEBREX) 200 MG capsule Take 200 mg by mouth 2 (two) times daily.   cholecalciferol (VITAMIN D) 1000 UNITS tablet Take 1,000 Units by mouth in the morning and at bedtime.   fexofenadine (ALLEGRA) 180 MG tablet Take 1 tablet by mouth daily.   FIBER PO Take by mouth daily.  fluticasone (FLONASE) 50 MCG/ACT nasal spray Place into both nostrils 3 (three) times daily.   gabapentin (NEURONTIN) 300 MG capsule Take 3 capsules (900 mg total) by mouth 3 (three) times daily.   glucosamine-chondroitin 500-400 MG tablet Take 1 tablet by mouth daily.   guaiFENesin (MUCINEX) 600 MG 12 hr tablet Take 1,200 mg by mouth 2 (two) times daily.   L-Lysine 500 MG CAPS Take 1 capsule by mouth in the morning, at noon, and at bedtime.    montelukast (SINGULAIR) 10 MG tablet Take 10 mg by mouth at bedtime.    nystatin (MYCOSTATIN) 100000 UNIT/ML suspension Take 5 mLs by mouth as needed.    omeprazole (PRILOSEC) 20 MG capsule Take 20 mg by mouth daily.   rosuvastatin (CRESTOR) 5 MG tablet Take 1 tablet by mouth 2-3 times per week   VALACYCLOVIR HCL PO Take by mouth.     Review of Systems      All other systems reviewed and are otherwise negative except as noted above.  Physical Exam    VS:  BP 124/80   Pulse (!) 57   Ht 6\' 3"  (1.905 m)   Wt 198 lb 6.4 oz (90 kg)   BMI 24.80 kg/m  , BMI Body mass index is 24.8 kg/m.  Wt Readings from Last 3 Encounters:  09/16/21 198 lb 6.4 oz (90 kg)  09/05/21 199 lb (90.3 kg)  07/18/21 199 lb (90.3 kg)    GEN: Well nourished, well developed, in no acute distress.  Marfanoid body habitus.  HEENT: normal. Neck: Supple, no JVD, carotid bruits, or masses. Cardiac: RRR, no murmurs, rubs, or gallops. No clubbing, cyanosis, edema.  Radials/PT 2+ and equal bilaterally.  Respiratory:  Respirations regular and unlabored, clear to auscultation bilaterally. GI: Soft, nontender, nondistended. MS: No deformity or atrophy. Skin: Warm and dry, no rash. Neuro:  Strength and sensation are intact. Psych: Normal affect.  Assessment & Plan    Nonobstructive coronary artery disease - Cardiac CTA 10/22/19 with coronary calcium score of 57 and minimal nonobstructive disease <25% in LAD. Stable with no anginal symptoms. No indication for ischemic evaluation.  No indication for stress testing. Heart healthy diet and regular cardiovascular exercise encouraged, he plans to continue his swimming. GDMT includes Rosuvastatin. No beta blocker due to baseline bradycardia. Will fax our note today to life insurance company per his request.   Ascending aortic aneurysm in the setting of marfanoid body habitus and family history of Marfan syndrome and ascending aortic aneurysm -chest CTA 04/27/2018 ascending aortic dimension 41 mm and aortic root dimension at the sinuses level 43 mm.  Chest CTA 09/2019 unchanged diameter at sense of a 43 mm well maximum diameter ascending aorta was 40 mm.  Repeat CT 12/2020 with stable 3.9 cm ascending aorta, 3.1 cm arch aneurysm without complicating feature.  Recommended for annual imaging which is scheduled for 12/2021. His blood pressure and cholesterol are optimally controlled.   HLD, LDL goal less than 70 - Most recent LDL 67. LDL at goal. Continue Crestor 5mg  three times per week.   Disposition: Follow up in 1 year(s) with Dr. 01/2022 or APP.  Signed, , NP 09/16/2021, 9:26 AM [facility]

## 2021-09-19 ENCOUNTER — Other Ambulatory Visit: Payer: Self-pay | Admitting: Student in an Organized Health Care Education/Training Program

## 2021-09-21 ENCOUNTER — Encounter: Payer: Self-pay | Admitting: Student in an Organized Health Care Education/Training Program

## 2021-09-22 ENCOUNTER — Telehealth: Payer: Self-pay | Admitting: *Deleted

## 2021-09-22 NOTE — Telephone Encounter (Signed)
Needs refill of Gabapentin. Last prescribed 06-24-21, with 2 refills. Had TPI on 07-05-21.

## 2021-09-26 MED ORDER — GABAPENTIN 300 MG PO CAPS: 900.0000 mg | ORAL_CAPSULE | Freq: Three times a day (TID) | ORAL | 11 refills | Status: DC

## 2021-10-10 ENCOUNTER — Telehealth: Payer: Self-pay

## 2021-10-10 NOTE — Telephone Encounter (Signed)
Pt is wanting a new order for TPI

## 2021-10-10 NOTE — Telephone Encounter (Signed)
error 

## 2021-10-11 ENCOUNTER — Encounter: Payer: Self-pay | Admitting: Student in an Organized Health Care Education/Training Program

## 2021-10-12 ENCOUNTER — Ambulatory Visit
Payer: Medicare PPO | Attending: Student in an Organized Health Care Education/Training Program | Admitting: Student in an Organized Health Care Education/Training Program

## 2021-10-12 ENCOUNTER — Encounter: Payer: Self-pay | Admitting: Student in an Organized Health Care Education/Training Program

## 2021-10-12 ENCOUNTER — Other Ambulatory Visit: Payer: Self-pay

## 2021-10-12 DIAGNOSIS — G894 Chronic pain syndrome: Secondary | ICD-10-CM

## 2021-10-12 DIAGNOSIS — G5702 Lesion of sciatic nerve, left lower limb: Secondary | ICD-10-CM

## 2021-10-12 NOTE — Progress Notes (Signed)
Patient: Patrick Moyer  Service Category: E/M  Provider: Gillis Santa, MD  DOB: 09-22-1951  DOS: 10/12/2021  Location: Office  MRN: [id number]  Setting: Ambulatory outpatient  Referring Provider: Lawerance Cruel, MD  Type: Established Patient  Specialty: Interventional Pain Management  PCP: Lawerance Cruel, MD  Location: Home  Delivery: TeleHealth     Virtual Encounter - Pain Management PROVIDER NOTE: Information contained herein reflects review and annotations entered in association with encounter. Interpretation of such information and data should be left to medically-trained personnel. Information provided to patient can be located elsewhere in the medical record under "Patient Instructions". Document created using STT-dictation technology, any transcriptional errors that may result from process are unintentional.    Contact & Pharmacy Preferred: [phone] Home: [phone] (home) Mobile: [phone] (mobile) E-mail: [email or web address]  [id number], [place] [address] Phone: [phone] Fax: [phone]  [id number] [address] [address] Phone: [phone] Fax: [phone]   Pre-screening  Patrick Moyer offered "in-person" vs "virtual" encounter. He indicated preferring virtual for this encounter.   Reason COVID-19*  Social distancing based on CDC and AMA recommendations.   I contacted Patrick Moyer on 10/12/2021 via telephone.      I clearly identified myself as Gillis Santa, MD. I verified that I was speaking with the correct person using two identifiers (Name: Patrick Moyer, and date of birth: 08-Feb-1951).  Consent I sought verbal advanced consent from Patrick Moyer for virtual visit interactions. I informed Patrick Moyer of possible security and privacy concerns, risks, and limitations associated with providing "not-in-person"  medical evaluation and management services. I also informed Patrick Moyer of the availability of "in-person" appointments. Finally, I informed him that there would be a charge for the virtual visit and that he could be  personally, fully or partially, financially responsible for it. Patrick Moyer expressed understanding and agreed to proceed.   Historic Elements   Patrick Moyer is a 70 y.o. year old, male patient evaluated today after our last contact on 09/19/2021. Patrick Moyer  has a past medical history of ALLERGIC RHINITIS, Arthritis pain of wrist, Bell's palsy, Benign localized prostatic hyperplasia with lower urinary tract symptoms (LUTS), Chronic bilateral low back pain with bilateral sciatica, Diverticulosis, ED (erectile dysfunction), Environmental and seasonal allergies, Family history of Marfan syndrome, GERD (gastroesophageal reflux disease), Hiatal hernia, High cholesterol, History of closed head injury, History of positive PPD (age 70), HSV-1 infection, Marfanoid habitus, Mild CAD, Non-celiac gluten sensitivity, Numbness and tingling in both hands, OSA on CPAP, Pigment dispersion syndrome, Polyneuropathy, Pulmonary nodules, Radiculopathy of leg, Thoracic ascending aortic aneurysm, and Wears glasses. He also  has a past surgical history that includes Nasal septum surgery (1975); Wisdom tooth extraction (1975); Finger exploration (Left, 1995); Laparoscopic appendectomy (2010); Inguinal hernia repair (Left, 1978); and Transurethral resection of prostate (N/A, 05/05/2020). Patrick Moyer has a current medication list which includes the following prescription(s): acetaminophen, acyclovir ointment, biotene dry mouth, b complex vitamins, ca carbonate-mag hydroxide, celecoxib, cholecalciferol, fexofenadine, fiber, fluticasone, gabapentin, glucosamine-chondroitin, guaifenesin, l-lysine, montelukast, nystatin, omeprazole, rosuvastatin, and valacyclovir hcl. He  reports that he has never smoked. He has never used  smokeless tobacco. He reports that he does not drink alcohol and does not use drugs. Patrick Moyer is allergic to other, cialis [tadalafil], rosuvastatin, sildenafil, and soy protein [soybean oil].   HPI  Today, he is being contacted for worsening  of previously known (established) problem  Patient is requesting a repeat piriformis injection on the left side.  He receives these every 4 to 8 weeks for piriformis syndrome and they are helpful.  He states that he tried to call Monday to schedule the procedure however the front desk staff according to the patient told him that there was not an order that they would look into it.  He was not called back.  When he called back again on Tuesday, he was told that he needed an appointment to discuss procedure even though he had a as needed order in place.   Laboratory Chemistry Profile   Renal Lab Results  Component Value Date   BUN 11 12/27/2020   CREATININE 0.70 (L) 12/27/2020   BCR 16 12/27/2020   GFRAA 112 12/27/2020   GFRNONAA 97 12/27/2020    Hepatic Lab Results  Component Value Date   AST 18 12/27/2020   ALT 14 12/27/2020   ALBUMIN 4.3 12/27/2020   ALKPHOS 65 12/27/2020    Electrolytes Lab Results  Component Value Date   NA 142 12/27/2020   K 4.1 12/27/2020   CL 107 (H) 12/27/2020   CALCIUM 9.1 12/27/2020    Bone No results found for: VD25OH, VD125OH2TOT, QI2979GX2, JJ9417EY8, 25OHVITD1, 25OHVITD2, 25OHVITD3, TESTOFREE, TESTOSTERONE  Inflammation (CRP: Acute Phase) (ESR: Chronic Phase) No results found for: CRP, ESRSEDRATE, LATICACIDVEN       Note: Above Lab results reviewed.   Assessment  The primary encounter diagnosis was Piriformis syndrome of left side. A diagnosis of Chronic pain syndrome was also pertinent to this visit.  Plan of Care   Mr. Patrick Moyer has a current medication list which includes the following long-term medication(s): fexofenadine, gabapentin, omeprazole, and rosuvastatin.   Orders:  Orders  Placed This Encounter  Procedures   TRIGGER POINT INJECTION    Area: Buttocks region (gluteal area) Indications: Piriformis muscle pain;  LEFT  piriformis-syndrome; piriformis muscle spasms (X44.818). CPT code: 20552    Scheduling Instructions:     Type: Myoneural block (TPI) of piriformis muscle.     Side:  LEFT     Sedation: Patient's choice.     Timeframe: Today    Order Specific Question:   Where will this procedure be performed?    Answer:   [facility]    Follow-up plan:   Return in about 5 days (around 10/17/2021) for Left Piriformis TPI , without sedation.     LEFT L4/5 ESI 11/24/20: Not effective.  Left piriformis TPI #1 02/02/2021 (100% pain relief for 2 days),  #2 03/14/21, #3 04/18/21, #5 07/18/21, #6 09/05/21     Recent Visits Date Type Provider Dept  09/05/21 Procedure visit Gillis Santa, MD [facility]  08/18/21 Telemedicine Gillis Santa, MD [facility]  07/18/21 Procedure visit Gillis Santa, MD [facility]  Showing recent visits within past 90 days and meeting all other requirements Today's Visits Date Type Provider Dept  10/12/21 Office Visit Gillis Santa, MD [facility]  Showing today's visits and meeting all other requirements Future Appointments No visits were found meeting these conditions. Showing future appointments within next 90 days and meeting all other requirements I discussed the assessment and treatment plan with the patient. The patient was provided an opportunity to ask questions and all were answered. The patient agreed with the plan and demonstrated an understanding of the instructions.  Patient advised to call back or seek an in-person evaluation if the symptoms or condition  worsens.  Duration of encounter: 15 minutes.  Note by: Gillis Santa, MD Date: 10/12/2021; Time: 2:10 PM

## 2021-10-13 NOTE — Patient Instructions (Signed)
____________________________________________________________________________________________  Preparing for your procedure (without sedation)  Procedure appointments are limited to planned procedures: . No Prescription Refills. . No disability issues will be discussed. . No medication changes will be discussed.  Instructions: . Oral Intake: Do not eat or drink anything for at least 6 hours prior to your procedure. (Exception: Blood Pressure Medication. See below.) . Transportation: Unless otherwise stated by your physician, you may drive yourself after the procedure. . Blood Pressure Medicine: Do not forget to take your blood pressure medicine with a sip of water the morning of the procedure. If your Diastolic (lower reading)is above 100 mmHg, elective cases will be cancelled/rescheduled. . Blood thinners: These will need to be stopped for procedures. Notify our staff if you are taking any blood thinners. Depending on which one you take, there will be specific instructions on how and when to stop it. . Diabetics on insulin: Notify the staff so that you can be scheduled 1st case in the morning. If your diabetes requires high dose insulin, take only  of your normal insulin dose the morning of the procedure and notify the staff that you have done so. . Preventing infections: Shower with an antibacterial soap the morning of your procedure.  . Build-up your immune system: Take 1000 mg of Vitamin C with every meal (3 times a day) the day prior to your procedure. . Antibiotics: Inform the staff if you have a condition or reason that requires you to take antibiotics before dental procedures. . Pregnancy: If you are pregnant, call and cancel the procedure. . Sickness: If you have a cold, fever, or any active infections, call and cancel the procedure. . Arrival: You must be in the facility at least 30 minutes prior to your scheduled procedure. . Children: Do not bring any children with you. . Dress  appropriately: Bring dark clothing that you would not mind if they get stained. . Valuables: Do not bring any jewelry or valuables.  Reasons to call and reschedule or cancel your procedure: (Following these recommendations will minimize the risk of a serious complication.) . Surgeries: Avoid having procedures within 2 weeks of any surgery. (Avoid for 2 weeks before or after any surgery). . Flu Shots: Avoid having procedures within 2 weeks of a flu shots or . (Avoid for 2 weeks before or after immunizations). . Barium: Avoid having a procedure within 7-10 days after having had a radiological study involving the use of radiological contrast. (Myelograms, Barium swallow or enema study). . Heart attacks: Avoid any elective procedures or surgeries for the initial 6 months after a "Myocardial Infarction" (Heart Attack). . Blood thinners: It is imperative that you stop these medications before procedures. Let us know if you if you take any blood thinner.  . Infection: Avoid procedures during or within two weeks of an infection (including chest colds or gastrointestinal problems). Symptoms associated with infections include: Localized redness, fever, chills, night sweats or profuse sweating, burning sensation when voiding, cough, congestion, stuffiness, runny nose, sore throat, diarrhea, nausea, vomiting, cold or Flu symptoms, recent or current infections. It is specially important if the infection is over the area that we intend to treat. . Heart and lung problems: Symptoms that may suggest an active cardiopulmonary problem include: cough, chest pain, breathing difficulties or shortness of breath, dizziness, ankle swelling, uncontrolled high or unusually low blood pressure, and/or palpitations. If you are experiencing any of these symptoms, cancel your procedure and contact your primary care physician for an evaluation.  Remember:  Regular   Business hours are:  Monday to Thursday 8:00 AM to 4:00  PM  Provider's Schedule: Patrick Naveira, MD:  Procedure days: Tuesday and Thursday 7:30 AM to 4:00 PM  Patrick Lateef, MD:  Procedure days: Monday and Wednesday 7:30 AM to 4:00 PM ____________________________________________________________________________________________    

## 2021-10-17 ENCOUNTER — Ambulatory Visit (HOSPITAL_BASED_OUTPATIENT_CLINIC_OR_DEPARTMENT_OTHER): Payer: Medicare PPO | Admitting: Student in an Organized Health Care Education/Training Program

## 2021-10-17 ENCOUNTER — Encounter: Payer: Self-pay | Admitting: Student in an Organized Health Care Education/Training Program

## 2021-10-17 ENCOUNTER — Ambulatory Visit
Admission: RE | Admit: 2021-10-17 | Discharge: 2021-10-17 | Disposition: A | Discharge status: Home / Self Care | Payer: Medicare PPO | Source: Ambulatory Visit | Attending: Student in an Organized Health Care Education/Training Program | Admitting: Student in an Organized Health Care Education/Training Program

## 2021-10-17 ENCOUNTER — Other Ambulatory Visit: Payer: Self-pay

## 2021-10-17 VITALS — BP 133/76 | HR 65 | Temp 97.2°F | Resp 16 | Ht 75.0 in | Wt 197.1 lb

## 2021-10-17 DIAGNOSIS — G5702 Lesion of sciatic nerve, left lower limb: Secondary | ICD-10-CM | POA: Diagnosis present

## 2021-10-17 DIAGNOSIS — G894 Chronic pain syndrome: Secondary | ICD-10-CM | POA: Insufficient documentation

## 2021-10-17 MED ORDER — ROPIVACAINE HCL 2 MG/ML IJ SOLN
9.0000 mL | Freq: Once | INTRAMUSCULAR | Status: AC
  Administered 2021-10-17: 20 mL via PERINEURAL

## 2021-10-17 MED ORDER — DEXAMETHASONE SODIUM PHOSPHATE 10 MG/ML IJ SOLN
10.0000 mg | Freq: Once | INTRAMUSCULAR | Status: AC
  Administered 2021-10-17: 10 mg
  Filled 2021-10-17: qty 1

## 2021-10-17 MED ORDER — ROPIVACAINE HCL 2 MG/ML IJ SOLN
INTRAMUSCULAR | Status: AC
  Filled 2021-10-17: qty 20

## 2021-10-17 MED ORDER — LIDOCAINE HCL 2 % IJ SOLN
20.0000 mL | Freq: Once | INTRAMUSCULAR | Status: AC
  Administered 2021-10-17: 200 mg
  Filled 2021-10-17: qty 10

## 2021-10-17 MED ORDER — IOHEXOL 180 MG/ML  SOLN: 10.0000 mL | Freq: Once | INTRAMUSCULAR | Status: DC

## 2021-10-17 MED ORDER — IOHEXOL 180 MG/ML  SOLN
INTRAMUSCULAR | Status: AC
  Filled 2021-10-17: qty 20

## 2021-10-17 NOTE — Patient Instructions (Signed)

## 2021-10-17 NOTE — Progress Notes (Signed)
Safety precautions to be maintained throughout the outpatient stay will include: orient to surroundings, keep bed in low position, maintain call bell within reach at all times, provide assistance with transfer out of bed and ambulation.  

## 2021-10-17 NOTE — Progress Notes (Signed)
PROVIDER NOTE: Information contained herein reflects review and annotations entered in association with encounter. Interpretation of such information and data should be left to medically-trained personnel. Information provided to patient can be located elsewhere in the medical record under "Patient Instructions". Document created using STT-dictation technology, any transcriptional errors that may result from process are unintentional.    Patient: Patrick Moyer  Service Category: Procedure  Provider: Edward Jolly, MD  DOB: Mar 05, 1951  DOS: 10/17/2021  Location: [facility]  MRN: [id number]  Setting: Ambulatory - outpatient  Referring Provider: Daisy Floro, MD  Type: Established Patient  Specialty: Interventional Pain Management  PCP: Daisy Floro, MD   Primary Reason for Visit: Interventional Pain Management Treatment. CC: Other (Left piriformis )  Procedure:          Anesthesia, Analgesia, Anxiolysis:  Type: Left piriformis trigger point injection #6 under fluoroscopic guidance Region: Inferior Lumbosacral Region Level: PIIS (Posterior Inferior Iliac Spine) 1 cm deep, 1 cm lateral and 1 cm inferior from posterior inferior fissure of SI joint. Laterality: Left-Side    Position: Prone           Indications: 1. Piriformis syndrome of left side   2. Chronic pain syndrome      Pain Score: Pre-procedure: 3/10 Post-procedure: 0-No pain/10   Pre-op H&P Assessment:  Patrick Moyer is a 70 y.o. (year old), male patient, seen today for interventional treatment. He  has a past surgical history that includes Nasal septum surgery (1975); Wisdom tooth extraction (1975); Finger exploration (Left, 1995); Laparoscopic appendectomy (2010); Inguinal hernia repair (Left, 1978); and Transurethral resection of prostate (N/A, 05/05/2020). Patrick Moyer has a current medication list which includes the following prescription(s): acetaminophen, acyclovir ointment, biotene dry mouth, b complex  vitamins, ca carbonate-mag hydroxide, celecoxib, cholecalciferol, fexofenadine, fiber, fluticasone, gabapentin, glucosamine-chondroitin, guaifenesin, l-lysine, montelukast, nystatin, omeprazole, rosuvastatin, and valacyclovir hcl, and the following Facility-Administered Medications: iohexol. His primarily concern today is the Other (Left piriformis )  Initial Vital Signs:  Pulse/HCG Rate: 64  Temp: (!) 97.2 F (36.2 C) Resp: 16 BP: 136/67 SpO2: 99 %  BMI: Estimated body mass index is 24.64 kg/m as calculated from the following:   Height as of this encounter: 6\' 3"  (1.905 m).   Weight as of this encounter: 197 lb 1.6 oz (89.4 kg).  Risk Assessment: Allergies: Reviewed. He is allergic to other, cialis [tadalafil], rosuvastatin, sildenafil, and soy protein [soybean oil].  Allergy Precautions: None required Coagulopathies: Reviewed. None identified.  Blood-thinner therapy: None at this time Active Infection(s): Reviewed. None identified. Patrick Moyer is afebrile  Site Confirmation: Patrick Moyer was asked to confirm the procedure and laterality before marking the site Procedure checklist: Completed Consent: Before the procedure and under the influence of no sedative(s), amnesic(s), or anxiolytics, the patient was informed of the treatment options, risks and possible complications. To fulfill our ethical and legal obligations, as recommended by the American Medical Association's Code of Ethics, I have informed the patient of my clinical impression; the nature and purpose of the treatment or procedure; the risks, benefits, and possible complications of the intervention; the alternatives, including doing nothing; the risk(s) and benefit(s) of the alternative treatment(s) or procedure(s); and the risk(s) and benefit(s) of doing nothing. The patient was provided information about the general risks and possible complications associated with the procedure. These may include, but are not limited to: failure  to achieve desired goals, infection, bleeding, organ or nerve damage, allergic reactions, paralysis, and death. In addition, the patient was  informed of those risks and complications associated to the procedure, such as failure to decrease pain; infection; bleeding; organ or nerve damage with subsequent damage to sensory, motor, and/or autonomic systems, resulting in permanent pain, numbness, and/or weakness of one or several areas of the body; allergic reactions; (i.e.: anaphylactic reaction); and/or death. Furthermore, the patient was informed of those risks and complications associated with the medications. These include, but are not limited to: allergic reactions (i.e.: anaphylactic or anaphylactoid reaction(s)); adrenal axis suppression; blood sugar elevation that in diabetics may result in ketoacidosis or comma; water retention that in patients with history of congestive heart failure may result in shortness of breath, pulmonary edema, and decompensation with resultant heart failure; weight gain; swelling or edema; medication-induced neural toxicity; particulate matter embolism and blood vessel occlusion with resultant organ, and/or nervous system infarction; and/or aseptic necrosis of one or more joints. Finally, the patient was informed that Medicine is not an exact science; therefore, there is also the possibility of unforeseen or unpredictable risks and/or possible complications that may result in a catastrophic outcome. The patient indicated having understood very clearly. We have given the patient no guarantees and we have made no promises. Enough time was given to the patient to ask questions, all of which were answered to the patient's satisfaction. Patrick Moyer has indicated that he wanted to continue with the procedure. Attestation: I, the ordering provider, attest that I have discussed with the patient the benefits, risks, side-effects, alternatives, likelihood of achieving goals, and potential  problems during recovery for the procedure that I have provided informed consent. Date  Time: 10/17/2021  8:35 AM  Pre-Procedure Preparation:  Monitoring: As per clinic protocol. Respiration, ETCO2, SpO2, BP, heart rate and rhythm monitor placed and checked for adequate function Safety Precautions: Patient was assessed for positional comfort and pressure points before starting the procedure. Time-out: I initiated and conducted the "Time-out" before starting the procedure, as per protocol. The patient was asked to participate by confirming the accuracy of the "Time Out" information. Verification of the correct person, site, and procedure were performed and confirmed by me, the nursing staff, and the patient. "Time-out" conducted as per Joint Commission's Universal Protocol (UP.01.01.01). Time: 7425  Description of Procedure:          Target Area: 1 cm deep, 1 cm lateral, 1 cm inferior to the Inferior, posterior, aspect of the sacroiliac fissure Approach: Posterior, paraspinal, ipsilateral approach. Area Prepped: Entire Lower Lumbosacral Region DuraPrep (Iodine Povacrylex [0.7% available iodine] and Isopropyl Alcohol, 74% w/w) Safety Precautions: Aspiration looking for blood return was conducted prior to all injections. At no point did we inject any substances, as a needle was being advanced. No attempts were made at seeking any paresthesias. Safe injection practices and needle disposal techniques used. Medications properly checked for expiration dates. SDV (single dose vial) medications used. Description of the Procedure: Protocol guidelines were followed. The patient was placed in position over the procedure table. The target area was identified and the area prepped in the usual manner. Skin & deeper tissues infiltrated with local anesthetic. Appropriate amount of time allowed to pass for local anesthetics to take effect. The procedure needle was advanced under fluoroscopic guidance into the  sacroiliac joint until a firm endpoint was obtained. Proper needle placement secured. Negative aspiration confirmed. Solution injected in intermittent fashion, asking for systemic symptoms every 0.5cc of injectate. The needles were then removed and the area cleansed, making sure to leave some of the prepping solution back to take  advantage of its long term bactericidal properties. Vitals:   10/17/21 0837 10/17/21 0902  BP: 136/67 133/76  Pulse: 64 65  Resp: 16   Temp: (!) 97.2 F (36.2 C)   TempSrc: Temporal   SpO2: 99% 98%  Weight: 197 lb 1.6 oz (89.4 kg)   Height: 6\' 3"  (1.905 m)       Start Time: 0855 hrs. End Time: 0859 hrs. Materials:  Needle(s) Type: Spinal Needle Gauge: 25G Length: 3.5-in Medication(s): Please see orders for medications and dosing details.  7cc solution made of 6cc of 0.2% ropivacaine, 1 cc of Decadron 10 mg/cc.  Injected into the left piriformis muscle after contrast confirmation. Imaging Guidance (Non-Spinal):          Type of Imaging Technique: Fluoroscopy Guidance (Non-Spinal) Indication(s): Assistance in needle guidance and placement for procedures requiring needle placement in or near specific anatomical locations not easily accessible without such assistance. Exposure Time: Please see nurses notes. Contrast: Before injecting any contrast, we confirmed that the patient did not have an allergy to iodine, shellfish, or radiological contrast. Once satisfactory needle placement was completed at the desired level, radiological contrast was injected. Contrast injected under live fluoroscopy. No contrast complications. See chart for type and volume of contrast used. Fluoroscopic Guidance: I was personally present during the use of fluoroscopy. "Tunnel Vision Technique" used to obtain the best possible view of the target area. Parallax error corrected before commencing the procedure. "Direction-depth-direction" technique used to introduce the needle under continuous  pulsed fluoroscopy. Once target was reached, antero-posterior, oblique, and lateral fluoroscopic projection used confirm needle placement in all planes. Images permanently stored in EMR. Interpretation: I personally interpreted the imaging intraoperatively. Adequate needle placement confirmed in multiple planes. Appropriate spread of contrast into desired area was observed. No evidence of afferent or efferent intravascular uptake. Permanent images saved into the patient's record.  Post-operative Assessment:  Post-procedure Vital Signs:  Pulse/HCG Rate: 65  Temp: (!) 97.2 F (36.2 C) Resp: 16 BP: 133/76 SpO2: 98 %  EBL: None  Complications: No immediate post-treatment complications observed by team, or reported by patient.  Note: The patient tolerated the entire procedure well. A repeat set of vitals were taken after the procedure and the patient was kept under observation following institutional policy, for this type of procedure. Post-procedural neurological assessment was performed, showing return to baseline, prior to discharge. The patient was provided with post-procedure discharge instructions, including a section on how to identify potential problems. Should any problems arise concerning this procedure, the patient was given instructions to immediately contact , at any time, without hesitation. In any case, we plan to contact the patient by telephone for a follow-up status report regarding this interventional procedure.  Comments:  No additional relevant information.  Plan of Care  Orders:  Orders Placed This Encounter  Procedures   TRIGGER POINT INJECTION    Area: Buttocks region (gluteal area) Indications: Piriformis muscle pain;  left piriformis-syndrome; piriformis muscle spasms [place]). CPT code: (K02.542    Standing Status:   Standing    Number of Occurrences:   6    Standing Expiration Date:   04/17/2022    Scheduling Instructions:     Type: Myoneural block (TPI) of  piriformis muscle.     Side: left     Sedation: Patient's choice.     Timeframe: PRN    Order Specific Question:   Where will this procedure be performed?    Answer:   [facility]   DG  PAIN CLINIC C-ARM 1-60 MIN NO REPORT    Intraoperative interpretation by procedural physician at [facility].    Standing Status:   Standing    Number of Occurrences:   1    Order Specific Question:   Reason for exam:    Answer:   Assistance in needle guidance and placement for procedures requiring needle placement in or near specific anatomical locations not easily accessible without such assistance.     Medications ordered for procedure: Meds ordered this encounter  Medications   lidocaine (XYLOCAINE) 2 % (with pres) injection 400 mg   ropivacaine (PF) 2 mg/mL (0.2%) (NAROPIN) injection 9 mL   dexamethasone (DECADRON) injection 10 mg   iohexol (OMNIPAQUE) 180 MG/ML injection 10 mL    Must be Myelogram-compatible. If not available, you may substitute with a water-soluble, non-ionic, hypoallergenic, myelogram-compatible radiological contrast medium.     Medications administered: We administered lidocaine, ropivacaine (PF) 2 mg/mL (0.2%), and dexamethasone.  See the medical record for exact dosing, route, and time of administration.  Follow-up plan:   Return PRN repeat piriformis TPI.      LEFT L4/5 ESI 11/24/20: Not effective.  Left piriformis TPI #1 02/02/2021 (100% pain relief for 2 days),  #2 03/14/21, #3 04/18/21, #4 07/18/21, #5 09/05/21, #6 10/17/21    Recent Visits Date Type Provider Dept  10/12/21 Office Visit Edward Jolly, MD [facility]  09/05/21 Procedure visit Edward Jolly, MD [facility]  08/18/21 Telemedicine Edward Jolly, MD [facility]  Showing recent visits within past 90 days and meeting all other requirements Today's Visits Date Type Provider Dept  10/17/21 Procedure visit Edward Jolly, MD [facility]  Showing today's  visits and meeting all other requirements Future Appointments No visits were found meeting these conditions. Showing future appointments within next 90 days and meeting all other requirements Disposition: Discharge home  Discharge (Date  Time): 10/17/2021; 0905 hrs.   Primary Care Physician: Daisy Floro, MD Location: [facility] Note by: Edward Jolly, MD Date: 10/17/2021; Time: 9:05 AM  Disclaimer:  Medicine is not an exact science. The only guarantee in medicine is that nothing is guaranteed. It is important to note that the decision to proceed with this intervention was based on the information collected from the patient. The Data and conclusions were drawn from the patient's questionnaire, the interview, and the physical examination. Because the information was provided in large part by the patient, it cannot be guaranteed that it has not been purposely or unconsciously manipulated. Every effort has been made to obtain as much relevant data as possible for this evaluation. It is important to note that the conclusions that lead to this procedure are derived in large part from the available data. Always take into account that the treatment will also be dependent on availability of resources and existing treatment guidelines, considered by other Pain Management Practitioners as being common knowledge and practice, at the time of the intervention. For Medico-Legal purposes, it is also important to point out that variation in procedural techniques and pharmacological choices are the acceptable norm. The indications, contraindications, technique, and results of the above procedure should only be interpreted and judged by a Board-Certified Interventional Pain Specialist with extensive familiarity and expertise in the same exact procedure and technique.

## 2021-10-18 ENCOUNTER — Telehealth: Payer: Self-pay

## 2021-10-18 NOTE — Telephone Encounter (Signed)
Post procedure phone call.  States he is doing perfect.

## 2021-10-31 ENCOUNTER — Telehealth: Payer: Self-pay | Admitting: *Deleted

## 2021-10-31 ENCOUNTER — Encounter: Payer: Self-pay | Admitting: Student in an Organized Health Care Education/Training Program

## 2021-10-31 DIAGNOSIS — E782 Mixed hyperlipidemia: Secondary | ICD-10-CM

## 2021-10-31 DIAGNOSIS — I7121 Aneurysm of the ascending aorta, without rupture: Secondary | ICD-10-CM

## 2021-10-31 DIAGNOSIS — Z0189 Encounter for other specified special examinations: Secondary | ICD-10-CM

## 2021-10-31 NOTE — Telephone Encounter (Signed)
-----   Message from Fabiola Backer, [place] sent at 10/31/2021  9:10 AM EST ----- Regarding: Need Order Extended Hi, this patient cannot come for his CT until 01/09/2022 @ 8:30.The current order is still in under Tobias Alexander and expires on 01/06/2022. I don't know what doctor this patient has been assigned to.   Thanks!

## 2021-10-31 NOTE — Telephone Encounter (Signed)
New CT Angio Chest Aorta order placed for this pt to have done in Jan 2023, and under new establishing Cardiologist, Dr. Shari Prows.  Will make our CT dept aware new order placed under new Cardiologist, so that they can arrange this appt for the pt.  Also future BMET placed in case CT needs this, per protocol.

## 2021-11-07 ENCOUNTER — Other Ambulatory Visit: Payer: Self-pay

## 2021-11-07 ENCOUNTER — Encounter: Payer: Self-pay | Admitting: Student in an Organized Health Care Education/Training Program

## 2021-11-07 ENCOUNTER — Ambulatory Visit (HOSPITAL_BASED_OUTPATIENT_CLINIC_OR_DEPARTMENT_OTHER): Payer: Medicare PPO | Admitting: Student in an Organized Health Care Education/Training Program

## 2021-11-07 ENCOUNTER — Ambulatory Visit
Admission: RE | Admit: 2021-11-07 | Discharge: 2021-11-07 | Disposition: A | Discharge status: Home / Self Care | Payer: Medicare PPO | Source: Ambulatory Visit | Attending: Student in an Organized Health Care Education/Training Program | Admitting: Student in an Organized Health Care Education/Training Program

## 2021-11-07 DIAGNOSIS — G5702 Lesion of sciatic nerve, left lower limb: Secondary | ICD-10-CM | POA: Insufficient documentation

## 2021-11-07 DIAGNOSIS — G894 Chronic pain syndrome: Secondary | ICD-10-CM | POA: Insufficient documentation

## 2021-11-07 MED ORDER — IOHEXOL 180 MG/ML  SOLN
10.0000 mL | Freq: Once | INTRAMUSCULAR | Status: AC
  Administered 2021-11-07: 10 mL via INTRA_ARTICULAR
  Filled 2021-11-07: qty 10

## 2021-11-07 MED ORDER — DEXAMETHASONE SODIUM PHOSPHATE 10 MG/ML IJ SOLN
10.0000 mg | Freq: Once | INTRAMUSCULAR | Status: AC
  Administered 2021-11-07: 10 mg
  Filled 2021-11-07: qty 1

## 2021-11-07 MED ORDER — LIDOCAINE HCL 2 % IJ SOLN
20.0000 mL | Freq: Once | INTRAMUSCULAR | Status: AC
  Administered 2021-11-07: 200 mg

## 2021-11-07 MED ORDER — ROPIVACAINE HCL 2 MG/ML IJ SOLN
4.0000 mL | Freq: Once | INTRAMUSCULAR | Status: AC
  Administered 2021-11-07: 4 mL via INTRA_ARTICULAR

## 2021-11-07 NOTE — Progress Notes (Signed)
Safety precautions to be maintained throughout the outpatient stay will include: orient to surroundings, keep bed in low position, maintain call bell within reach at all times, provide assistance with transfer out of bed and ambulation.  

## 2021-11-07 NOTE — Patient Instructions (Signed)
Trigger Point Injection ?Trigger points are areas where you have pain. A trigger point injection is a shot given in the trigger point to help relieve pain for a few days to a few months. Common places for trigger points include the neck, shoulders, upper back, or lower back. ?A trigger point injection will not cure long-term (chronic) pain permanently. These injections do not always work for every person. For some people, they can help to relieve pain for a few days to a few months. ?Tell a health care provider about: ?Any allergies you have. ?All medicines you are taking, including vitamins, herbs, eye drops, creams, and over-the-counter medicines. ?Any problems you or family members have had with anesthetic medicines. ?Any bleeding problems you have. ?Any surgeries you have had. ?Any medical conditions you have. ?Whether you are pregnant or may be pregnant. ?What are the risks? ?Generally, this is a safe procedure. However, problems may occur, including: ?Infection. ?Bleeding or bruising. ?Allergic reaction to the injected medicine. ?Irritation of the skin around the injection site. ?What happens before the procedure? ?Ask your health care provider about: ?Changing or stopping your regular medicines. This is especially important if you are taking diabetes medicines or blood thinners. ?Taking medicines such as aspirin and ibuprofen. These medicines can thin your blood. Do not take these medicines unless your health care provider tells you to take them. ?Taking over-the-counter medicines, vitamins, herbs, and supplements. ?What happens during the procedure? ? ?Your health care provider will feel for trigger points. A marker may be used to circle the area for the injection. ?The skin over the trigger point will be washed with a germ-killing soap. ?You may be given a medicine to help you relax (sedative). ?A thin needle is used for the injection. You may feel pain or a twitching feeling when the needle enters your  skin. ?A numbing solution may be injected into the trigger point. Sometimes a medicine to keep down inflammation is also injected. ?Your health care provider may move the needle around the area where the trigger point is located until the tightness and twitching goes away. ?After the injection, your health care provider may put gentle pressure over the injection site. ?The injection site will be covered with a bandage (dressing). ?The procedure may vary among health care providers and hospitals. ?What can I expect after treatment? ?After treatment, you may have soreness and stiffness for 1-2 days. ?Follow these instructions at home: ?Injection site care ?Remove your dressing in a few hours, or as told by your health care provider. ?Check your injection site every day for signs of infection. Check for: ?Redness, swelling, or pain. ?Fluid or blood. ?Warmth. ?Pus or a bad smell. ?Managing pain, stiffness, and swelling ?If directed, put ice on the affected area. To do this: ?Put ice in a plastic bag. ?Place a towel between your skin and the bag. ?Leave the ice on for 20 minutes, 2-3 times a day. ?Remove the ice if your skin turns bright red. This is very important. If you cannot feel pain, heat, or cold, you have a greater risk of damage to the area. ?Activity ?If you were given a sedative during the procedure, it can affect you for several hours. Do not drive or operate machinery until your health care provider says that it is safe. ?Do not take baths, swim, or use a hot tub until your health care provider approves. ?Return to your normal activities as told by your health care provider. Ask your health care provider what   activities are safe for you. ?General instructions ?If you were asked to stop your regular medicines, ask your health care provider when you may start taking them again. ?You may be asked to see an occupational or physical therapist for exercises that reduce muscle strain and stretch the area of the  trigger point. ?Keep all follow-up visits. This is important. ?Contact a health care provider if: ?Your pain comes back, and it is worse than before the injection. You may need more injections. ?You have chills or a fever. ?The injection site becomes more painful, red, swollen, or warm to the touch. ?Summary ?A trigger point injection is a shot given in the trigger point to help relieve pain. ?Common places for trigger point injections are the neck, shoulders, upper back, and lower back. ?These injections do not always work for every person, but for some people, the injections can help to relieve pain for a few days to a few months. ?Contact a health care provider if symptoms come back or if they are worse than before treatment. Also, get help if the injection site becomes more painful, red, swollen, or warm to the touch. ?This information is not intended to replace advice given to you by your health care provider. Make sure you discuss any questions you have with your health care provider. ?Document Revised: 03/22/2021 Document Reviewed: 03/22/2021 ?Elsevier Patient Education ? 2022 [employer] ?Pain Management Discharge Instructions ? ?General Discharge Instructions : ? ?If you need to reach your doctor call: Monday-Friday 8:00 am - 4:00 pm at [phone] or toll free [phone].  After clinic hours [phone] to have operator reach doctor. ? ?Bring all of your medication bottles to all your appointments in the pain clinic. ? ?To cancel or reschedule your appointment with Pain Management please remember to call 24 hours in advance to avoid a fee. ? ?Refer to the educational materials which you have been given on: General Risks, I had my Procedure. Discharge Instructions, Post Sedation. ? ?Post Procedure Instructions: ? ?The drugs you were given will stay in your system until tomorrow, so for the next 24 hours you should not drive, make any legal decisions or drink any alcoholic beverages. ? ?You may eat  anything you prefer, but it is better to start with liquids then soups and crackers, and gradually work up to solid foods. ? ?Please notify your doctor immediately if you have any unusual bleeding, trouble breathing or pain that is not related to your normal pain. ? ?Depending on the type of procedure that was done, some parts of your body may feel week and/or numb.  This usually clears up by tonight or the next day. ? ?Walk with the use of an assistive device or accompanied by an adult for the 24 hours. ? ?You may use ice on the affected area for the first 24 hours.  Put ice in a Ziploc bag and cover with a towel and place against area 15 minutes on 15 minutes off.  You may switch to heat after 24 hours. ?

## 2021-11-07 NOTE — Progress Notes (Signed)
PROVIDER NOTE: Information contained herein reflects review and annotations entered in association with encounter. Interpretation of such information and data should be left to medically-trained personnel. Information provided to patient can be located elsewhere in the medical record under "Patient Instructions". Document created using STT-dictation technology, any transcriptional errors that may result from process are unintentional.    Patient: Patrick Moyer  Service Category: Procedure  Provider: Edward Jolly, MD  DOB: 06/14/1951  DOS: 11/07/2021  Location: [facility]  MRN: [id number]  Setting: Ambulatory - outpatient  Referring Provider: Edward Jolly, MD  Type: Established Patient  Specialty: Interventional Pain Management  PCP: Daisy Floro, MD   Primary Reason for Visit: Interventional Pain Management Treatment. CC: Pain (Piriformis muscle, left)  Procedure:          Anesthesia, Analgesia, Anxiolysis:  Type: Left piriformis trigger point injection #6 under fluoroscopic guidance Region: Inferior Lumbosacral Region Level: PIIS (Posterior Inferior Iliac Spine) 1 cm deep, 1 cm lateral and 1 cm inferior from posterior inferior fissure of SI joint. Laterality: Left-Side    Position: Prone           Indications: 1. Piriformis syndrome of left side   2. Chronic pain syndrome      Pain Score: Pre-procedure: 3/10 Post-procedure: 0-No pain/10   Pre-op H&P Assessment:  Patrick Moyer is a 70 y.o. (year old), male patient, seen today for interventional treatment. He  has a past surgical history that includes Nasal septum surgery (1975); Wisdom tooth extraction (1975); Finger exploration (Left, 1995); Laparoscopic appendectomy (2010); Inguinal hernia repair (Left, 1978); and Transurethral resection of prostate (N/A, 05/05/2020). Patrick Moyer has a current medication list which includes the following prescription(s): acetaminophen, acyclovir ointment, biotene dry mouth, b  complex vitamins, ca carbonate-mag hydroxide, celecoxib, cholecalciferol, fexofenadine, fiber, fluticasone, gabapentin, glucosamine-chondroitin, guaifenesin, l-lysine, montelukast, nystatin, omeprazole, rosuvastatin, and valacyclovir hcl. His primarily concern today is the Pain (Piriformis muscle, left)  Initial Vital Signs:  Pulse/HCG Rate: 62  Temp: (!) 97.2 F (36.2 C) Resp: 16 BP: 136/72 SpO2: 100 %  BMI: Estimated body mass index is 24.75 kg/m as calculated from the following:   Height as of this encounter: 6\' 3"  (1.905 m).   Weight as of this encounter: 198 lb (89.8 kg).  Risk Assessment: Allergies: Reviewed. He is allergic to other, cialis [tadalafil], rosuvastatin, sildenafil, and soy protein [soybean oil].  Allergy Precautions: None required Coagulopathies: Reviewed. None identified.  Blood-thinner therapy: None at this time Active Infection(s): Reviewed. None identified. Patrick Moyer is afebrile  Site Confirmation: Patrick Moyer was asked to confirm the procedure and laterality before marking the site Procedure checklist: Completed Consent: Before the procedure and under the influence of no sedative(s), amnesic(s), or anxiolytics, the patient was informed of the treatment options, risks and possible complications. To fulfill our ethical and legal obligations, as recommended by the American Medical Association's Code of Ethics, I have informed the patient of my clinical impression; the nature and purpose of the treatment or procedure; the risks, benefits, and possible complications of the intervention; the alternatives, including doing nothing; the risk(s) and benefit(s) of the alternative treatment(s) or procedure(s); and the risk(s) and benefit(s) of doing nothing. The patient was provided information about the general risks and possible complications associated with the procedure. These may include, but are not limited to: failure to achieve desired goals, infection, bleeding, organ  or nerve damage, allergic reactions, paralysis, and death. In addition, the patient was informed of those risks and complications associated to the  procedure, such as failure to decrease pain; infection; bleeding; organ or nerve damage with subsequent damage to sensory, motor, and/or autonomic systems, resulting in permanent pain, numbness, and/or weakness of one or several areas of the body; allergic reactions; (i.e.: anaphylactic reaction); and/or death. Furthermore, the patient was informed of those risks and complications associated with the medications. These include, but are not limited to: allergic reactions (i.e.: anaphylactic or anaphylactoid reaction(s)); adrenal axis suppression; blood sugar elevation that in diabetics may result in ketoacidosis or comma; water retention that in patients with history of congestive heart failure may result in shortness of breath, pulmonary edema, and decompensation with resultant heart failure; weight gain; swelling or edema; medication-induced neural toxicity; particulate matter embolism and blood vessel occlusion with resultant organ, and/or nervous system infarction; and/or aseptic necrosis of one or more joints. Finally, the patient was informed that Medicine is not an exact science; therefore, there is also the possibility of unforeseen or unpredictable risks and/or possible complications that may result in a catastrophic outcome. The patient indicated having understood very clearly. We have given the patient no guarantees and we have made no promises. Enough time was given to the patient to ask questions, all of which were answered to the patient's satisfaction. Patrick Moyer has indicated that he wanted to continue with the procedure. Attestation: I, the ordering provider, attest that I have discussed with the patient the benefits, risks, side-effects, alternatives, likelihood of achieving goals, and potential problems during recovery for the procedure that I have  provided informed consent. Date  Time: 11/07/2021 11:11 AM  Pre-Procedure Preparation:  Monitoring: As per clinic protocol. Respiration, ETCO2, SpO2, BP, heart rate and rhythm monitor placed and checked for adequate function Safety Precautions: Patient was assessed for positional comfort and pressure points before starting the procedure. Time-out: I initiated and conducted the "Time-out" before starting the procedure, as per protocol. The patient was asked to participate by confirming the accuracy of the "Time Out" information. Verification of the correct person, site, and procedure were performed and confirmed by me, the nursing staff, and the patient. "Time-out" conducted as per Joint Commission's Universal Protocol (UP.01.01.01). Time: 1133  Description of Procedure:          Target Area: 1 cm deep, 1 cm lateral, 1 cm inferior to the Inferior, posterior, aspect of the sacroiliac fissure Approach: Posterior, paraspinal, ipsilateral approach. Area Prepped: Entire Lower Lumbosacral Region DuraPrep (Iodine Povacrylex [0.7% available iodine] and Isopropyl Alcohol, 74% w/w) Safety Precautions: Aspiration looking for blood return was conducted prior to all injections. At no point did we inject any substances, as a needle was being advanced. No attempts were made at seeking any paresthesias. Safe injection practices and needle disposal techniques used. Medications properly checked for expiration dates. SDV (single dose vial) medications used. Description of the Procedure: Protocol guidelines were followed. The patient was placed in position over the procedure table. The target area was identified and the area prepped in the usual manner. Skin & deeper tissues infiltrated with local anesthetic. Appropriate amount of time allowed to pass for local anesthetics to take effect. The procedure needle was advanced under fluoroscopic guidance into the sacroiliac joint until a firm endpoint was obtained. Proper  needle placement secured. Negative aspiration confirmed. Solution injected in intermittent fashion, asking for systemic symptoms every 0.5cc of injectate. The needles were then removed and the area cleansed, making sure to leave some of the prepping solution back to take advantage of its long term bactericidal properties. Vitals:  11/07/21 1113 11/07/21 1133 11/07/21 1136  BP: 136/72  (!) 142/79  Pulse: 62 60 61  Resp: 16 18 16   Temp: (!) 97.2 F (36.2 C)    SpO2: 100% 99% 98%  Weight: 198 lb (89.8 kg)    Height: 6\' 3"  (1.905 m)        Start Time: 1133 hrs. End Time: 1135 hrs. Materials:  Needle(s) Type: Spinal Needle Gauge: 25G Length: 3.5-in Medication(s): Please see orders for medications and dosing details.  7cc solution made of 6cc of 0.2% ropivacaine, 1 cc of Decadron 10 mg/cc.  Injected into the left piriformis muscle after contrast confirmation. Imaging Guidance (Non-Spinal):          Type of Imaging Technique: Fluoroscopy Guidance (Non-Spinal) Indication(s): Assistance in needle guidance and placement for procedures requiring needle placement in or near specific anatomical locations not easily accessible without such assistance. Exposure Time: Please see nurses notes. Contrast: Before injecting any contrast, we confirmed that the patient did not have an allergy to iodine, shellfish, or radiological contrast. Once satisfactory needle placement was completed at the desired level, radiological contrast was injected. Contrast injected under live fluoroscopy. No contrast complications. See chart for type and volume of contrast used. Fluoroscopic Guidance: I was personally present during the use of fluoroscopy. "Tunnel Vision Technique" used to obtain the best possible view of the target area. Parallax error corrected before commencing the procedure. "Direction-depth-direction" technique used to introduce the needle under continuous pulsed fluoroscopy. Once target was reached,  antero-posterior, oblique, and lateral fluoroscopic projection used confirm needle placement in all planes. Images permanently stored in EMR. Interpretation: I personally interpreted the imaging intraoperatively. Adequate needle placement confirmed in multiple planes. Appropriate spread of contrast into desired area was observed. No evidence of afferent or efferent intravascular uptake. Permanent images saved into the patient's record.  Post-operative Assessment:  Post-procedure Vital Signs:  Pulse/HCG Rate: 61  Temp: (!) 97.2 F (36.2 C) Resp: 16 BP: (!) 142/79 SpO2: 98 %  EBL: None  Complications: No immediate post-treatment complications observed by team, or reported by patient.  Note: The patient tolerated the entire procedure well. A repeat set of vitals were taken after the procedure and the patient was kept under observation following institutional policy, for this type of procedure. Post-procedural neurological assessment was performed, showing return to baseline, prior to discharge. The patient was provided with post-procedure discharge instructions, including a section on how to identify potential problems. Should any problems arise concerning this procedure, the patient was given instructions to immediately contact , at any time, without hesitation. In any case, we plan to contact the patient by telephone for a follow-up status report regarding this interventional procedure.  Comments:  No additional relevant information.  Plan of Care  Orders:  Orders Placed This Encounter  Procedures   TRIGGER POINT INJECTION    Standing Status:   Standing    Number of Occurrences:   6    Standing Expiration Date:   05/07/2022    Scheduling Instructions:     Left PIRIFORMIS TPI w/o sedation     Patient will call to schedule    Order Specific Question:   Where will this procedure be performed?    Answer:   [facility]   DG [facility] C-ARM 1-60 MIN NO REPORT    Intraoperative  interpretation by procedural physician at [facility].    Standing Status:   Standing    Number of Occurrences:   1  Order Specific Question:   Reason for exam:    Answer:   Assistance in needle guidance and placement for procedures requiring needle placement in or near specific anatomical locations not easily accessible without such assistance.      Medications ordered for procedure: Meds ordered this encounter  Medications   iohexol (OMNIPAQUE) 180 MG/ML injection 10 mL    Must be Myelogram-compatible. If not available, you may substitute with a water-soluble, non-ionic, hypoallergenic, myelogram-compatible radiological contrast medium.   lidocaine (XYLOCAINE) 2 % (with pres) injection 400 mg   dexamethasone (DECADRON) injection 10 mg   ropivacaine (PF) 2 mg/mL (0.2%) (NAROPIN) injection 4 mL      Medications administered: We administered iohexol, lidocaine, dexamethasone, and ropivacaine (PF) 2 mg/mL (0.2%).  See the medical record for exact dosing, route, and time of administration.  Follow-up plan:   Return if symptoms worsen or fail to improve.      LEFT L4/5 ESI 11/24/20: Not effective.  Left piriformis TPI #1 02/02/2021 (100% pain relief for 2 days),  #2 03/14/21, #3 04/18/21, #4 07/18/21, #5 09/05/21, #6 10/17/21, #7 11/07/21    Recent Visits Date Type Provider Dept  10/17/21 Procedure visit Edward Jolly, MD [facility]  10/12/21 Office Visit Edward Jolly, MD [facility]  09/05/21 Procedure visit Edward Jolly, MD [facility]  08/18/21 Telemedicine Edward Jolly, MD [facility]  Showing recent visits within past 90 days and meeting all other requirements Today's Visits Date Type Provider Dept  11/07/21 Procedure visit Edward Jolly, MD [facility]  Showing today's visits and meeting all other requirements Future Appointments No visits were found meeting these conditions. Showing future appointments within next  90 days and meeting all other requirements Disposition: Discharge home  Discharge (Date  Time): 11/07/2021; 1145 hrs.   Primary Care Physician: Daisy Floro, MD Location: [facility] Note by: Edward Jolly, MD Date: 11/07/2021; Time: 11:37 AM  Disclaimer:  Medicine is not an exact science. The only guarantee in medicine is that nothing is guaranteed. It is important to note that the decision to proceed with this intervention was based on the information collected from the patient. The Data and conclusions were drawn from the patient's questionnaire, the interview, and the physical examination. Because the information was provided in large part by the patient, it cannot be guaranteed that it has not been purposely or unconsciously manipulated. Every effort has been made to obtain as much relevant data as possible for this evaluation. It is important to note that the conclusions that lead to this procedure are derived in large part from the available data. Always take into account that the treatment will also be dependent on availability of resources and existing treatment guidelines, considered by other Pain Management Practitioners as being common knowledge and practice, at the time of the intervention. For Medico-Legal purposes, it is also important to point out that variation in procedural techniques and pharmacological choices are the acceptable norm. The indications, contraindications, technique, and results of the above procedure should only be interpreted and judged by a Board-Certified Interventional Pain Specialist with extensive familiarity and expertise in the same exact procedure and technique.

## 2021-11-08 ENCOUNTER — Telehealth: Payer: Self-pay | Admitting: *Deleted

## 2021-11-08 NOTE — Telephone Encounter (Signed)
Post procedure call.  No questions or concerns.  States everything is much better.

## 2021-12-12 ENCOUNTER — Encounter: Payer: Self-pay | Admitting: Student in an Organized Health Care Education/Training Program

## 2021-12-12 ENCOUNTER — Ambulatory Visit (HOSPITAL_BASED_OUTPATIENT_CLINIC_OR_DEPARTMENT_OTHER): Payer: Medicare PPO | Admitting: Student in an Organized Health Care Education/Training Program

## 2021-12-12 ENCOUNTER — Other Ambulatory Visit: Payer: Self-pay

## 2021-12-12 ENCOUNTER — Ambulatory Visit
Admission: RE | Admit: 2021-12-12 | Discharge: 2021-12-12 | Disposition: A | Discharge status: Home / Self Care | Payer: Medicare PPO | Source: Ambulatory Visit | Attending: Student in an Organized Health Care Education/Training Program | Admitting: Student in an Organized Health Care Education/Training Program

## 2021-12-12 VITALS — BP 149/82 | HR 52 | Temp 97.3°F | Resp 16 | Ht 75.0 in | Wt 202.0 lb

## 2021-12-12 DIAGNOSIS — G5702 Lesion of sciatic nerve, left lower limb: Secondary | ICD-10-CM

## 2021-12-12 DIAGNOSIS — G894 Chronic pain syndrome: Secondary | ICD-10-CM | POA: Diagnosis not present

## 2021-12-12 MED ORDER — ROPIVACAINE HCL 2 MG/ML IJ SOLN
4.0000 mL | Freq: Once | INTRAMUSCULAR | Status: AC
  Administered 2021-12-12: 10:00:00 4 mL via INTRA_ARTICULAR

## 2021-12-12 MED ORDER — DEXAMETHASONE SODIUM PHOSPHATE 10 MG/ML IJ SOLN
10.0000 mg | Freq: Once | INTRAMUSCULAR | Status: AC
  Administered 2021-12-12: 10:00:00 10 mg
  Filled 2021-12-12: qty 1

## 2021-12-12 MED ORDER — LIDOCAINE HCL (PF) 1.5 % IJ SOLN
20.0000 mL | Freq: Once | INTRAMUSCULAR | Status: DC
  Filled 2021-12-12: qty 20

## 2021-12-12 MED ORDER — LIDOCAINE HCL 2 % IJ SOLN
INTRAMUSCULAR | Status: AC
  Filled 2021-12-12: qty 10

## 2021-12-12 MED ORDER — ROPIVACAINE HCL 2 MG/ML IJ SOLN
INTRAMUSCULAR | Status: AC
  Filled 2021-12-12: qty 20

## 2021-12-12 MED ORDER — LIDOCAINE HCL 2 % IJ SOLN
20.0000 mL | Freq: Once | INTRAMUSCULAR | Status: AC
  Administered 2021-12-12: 10:00:00 200 mg

## 2021-12-12 MED ORDER — IOHEXOL 180 MG/ML  SOLN
10.0000 mL | Freq: Once | INTRAMUSCULAR | Status: AC
  Administered 2021-12-12: 10:00:00 5 mL via INTRA_ARTICULAR
  Filled 2021-12-12: qty 10

## 2021-12-12 NOTE — Progress Notes (Signed)
Safety precautions to be maintained throughout the outpatient stay will include: orient to surroundings, keep bed in low position, maintain call bell within reach at all times, provide assistance with transfer out of bed and ambulation.  

## 2021-12-12 NOTE — Progress Notes (Signed)
PROVIDER NOTE: Information contained herein reflects review and annotations entered in association with encounter. Interpretation of such information and data should be left to medically-trained personnel. Information provided to patient can be located elsewhere in the medical record under "Patient Instructions". Document created using STT-dictation technology, any transcriptional errors that may result from process are unintentional.    Patient: Patrick Moyer  Service Category: Procedure  Provider: Edward Jolly, MD  DOB: 08-15-1951  DOS: 12/12/2021  Location: [facility]  MRN: [id number]  Setting: Ambulatory - outpatient  Referring Provider: Daisy Floro, MD  Type: Established Patient  Specialty: Interventional Pain Management  PCP: Daisy Floro, MD   Primary Reason for Visit: Interventional Pain Management Treatment. CC: No chief complaint on file.   Procedure:          Anesthesia, Analgesia, Anxiolysis:  Type: Left piriformis trigger point injection #6 under fluoroscopic guidance Region: Inferior Lumbosacral Region Level: PIIS (Posterior Inferior Iliac Spine) 1 cm deep, 1 cm lateral and 1 cm inferior from posterior inferior fissure of SI joint. Laterality: Left-Side    Position: Prone           Indications: 1. Piriformis syndrome of left side   2. Chronic pain syndrome      Pain Score: Pre-procedure: 3/10 Post-procedure: 0-No pain/10   Pre-op H&P Assessment:  Patrick Moyer is a 70 y.o. (year old), male patient, seen today for interventional treatment. He  has a past surgical history that includes Nasal septum surgery (1975); Wisdom tooth extraction (1975); Finger exploration (Left, 1995); Laparoscopic appendectomy (2010); Inguinal hernia repair (Left, 1978); and Transurethral resection of prostate (N/A, 05/05/2020). Patrick Moyer has a current medication list which includes the following prescription(s): acetaminophen, acyclovir ointment, biotene dry mouth, b  complex vitamins, ca carbonate-mag hydroxide, celecoxib, cholecalciferol, fexofenadine, fiber, fluticasone, gabapentin, glucosamine-chondroitin, guaifenesin, l-lysine, methylprednisolone, montelukast, nystatin, omeprazole, rosuvastatin, and valacyclovir hcl. His primarily concern today is the No chief complaint on file.   Initial Vital Signs:  Pulse/HCG Rate: 60  Temp: (!) 97.3 F (36.3 C) Resp: 16 BP: 132/78 SpO2: 100 %  BMI: Estimated body mass index is 25.25 kg/m as calculated from the following:   Height as of this encounter: 6\' 3"  (1.905 m).   Weight as of this encounter: 202 lb (91.6 kg).  Risk Assessment: Allergies: Reviewed. He is allergic to other, cialis [tadalafil], rosuvastatin, sildenafil, and soy protein [soybean oil].  Allergy Precautions: None required Coagulopathies: Reviewed. None identified.  Blood-thinner therapy: None at this time Active Infection(s): Reviewed. None identified. Patrick Moyer is afebrile  Site Confirmation: Patrick Moyer was asked to confirm the procedure and laterality before marking the site Procedure checklist: Completed Consent: Before the procedure and under the influence of no sedative(s), amnesic(s), or anxiolytics, the patient was informed of the treatment options, risks and possible complications. To fulfill our ethical and legal obligations, as recommended by the American Medical Association's Code of Ethics, I have informed the patient of my clinical impression; the nature and purpose of the treatment or procedure; the risks, benefits, and possible complications of the intervention; the alternatives, including doing nothing; the risk(s) and benefit(s) of the alternative treatment(s) or procedure(s); and the risk(s) and benefit(s) of doing nothing. The patient was provided information about the general risks and possible complications associated with the procedure. These may include, but are not limited to: failure to achieve desired goals,  infection, bleeding, organ or nerve damage, allergic reactions, paralysis, and death. In addition, the patient was informed of those  risks and complications associated to the procedure, such as failure to decrease pain; infection; bleeding; organ or nerve damage with subsequent damage to sensory, motor, and/or autonomic systems, resulting in permanent pain, numbness, and/or weakness of one or several areas of the body; allergic reactions; (i.e.: anaphylactic reaction); and/or death. Furthermore, the patient was informed of those risks and complications associated with the medications. These include, but are not limited to: allergic reactions (i.e.: anaphylactic or anaphylactoid reaction(s)); adrenal axis suppression; blood sugar elevation that in diabetics may result in ketoacidosis or comma; water retention that in patients with history of congestive heart failure may result in shortness of breath, pulmonary edema, and decompensation with resultant heart failure; weight gain; swelling or edema; medication-induced neural toxicity; particulate matter embolism and blood vessel occlusion with resultant organ, and/or nervous system infarction; and/or aseptic necrosis of one or more joints. Finally, the patient was informed that Medicine is not an exact science; therefore, there is also the possibility of unforeseen or unpredictable risks and/or possible complications that may result in a catastrophic outcome. The patient indicated having understood very clearly. We have given the patient no guarantees and we have made no promises. Enough time was given to the patient to ask questions, all of which were answered to the patient's satisfaction. Patrick Moyer has indicated that he wanted to continue with the procedure. Attestation: I, the ordering provider, attest that I have discussed with the patient the benefits, risks, side-effects, alternatives, likelihood of achieving goals, and potential problems during recovery for  the procedure that I have provided informed consent. Date   Time: 12/12/2021  9:36 AM  Pre-Procedure Preparation:  Monitoring: As per clinic protocol. Respiration, ETCO2, SpO2, BP, heart rate and rhythm monitor placed and checked for adequate function Safety Precautions: Patient was assessed for positional comfort and pressure points before starting the procedure. Time-out: I initiated and conducted the "Time-out" before starting the procedure, as per protocol. The patient was asked to participate by confirming the accuracy of the "Time Out" information. Verification of the correct person, site, and procedure were performed and confirmed by me, the nursing staff, and the patient. "Time-out" conducted as per Joint Commission's Universal Protocol (UP.01.01.01). Time: 1005  Description of Procedure:          Target Area: 1 cm deep, 1 cm lateral, 1 cm inferior to the Inferior, posterior, aspect of the sacroiliac fissure Approach: Posterior, paraspinal, ipsilateral approach. Area Prepped: Entire Lower Lumbosacral Region DuraPrep (Iodine Povacrylex [0.7% available iodine] and Isopropyl Alcohol, 74% w/w) Safety Precautions: Aspiration looking for blood return was conducted prior to all injections. At no point did we inject any substances, as a needle was being advanced. No attempts were made at seeking any paresthesias. Safe injection practices and needle disposal techniques used. Medications properly checked for expiration dates. SDV (single dose vial) medications used. Description of the Procedure: Protocol guidelines were followed. The patient was placed in position over the procedure table. The target area was identified and the area prepped in the usual manner. Skin & deeper tissues infiltrated with local anesthetic. Appropriate amount of time allowed to pass for local anesthetics to take effect. The procedure needle was advanced under fluoroscopic guidance into the sacroiliac joint until a firm  endpoint was obtained. Proper needle placement secured. Negative aspiration confirmed. Solution injected in intermittent fashion, asking for systemic symptoms every 0.5cc of injectate. The needles were then removed and the area cleansed, making sure to leave some of the prepping solution back to take advantage of  its long term bactericidal properties. Vitals:   12/12/21 0948 12/12/21 0950 12/12/21 1005 12/12/21 1010  BP:  132/78 (!) 150/83 (!) 149/82  Pulse: 60  (!) 50 (!) 52  Resp: 16  18 16   Temp: (!) 97.3 F (36.3 C)     TempSrc: Temporal     SpO2: 100% 100% 100% 100%  Weight: 202 lb (91.6 kg)     Height: 6\' 3"  (1.905 m)          Start Time: 1005 hrs. End Time: 1009 hrs. Materials:  Needle(s) Type: Spinal Needle Gauge: 25G Length: 3.5-in Medication(s): Please see orders for medications and dosing details.  7cc solution made of 6cc of 0.2% ropivacaine, 1 cc of Decadron 10 mg/cc.  Injected into the left piriformis muscle after contrast confirmation highlighting piriformis muscle striations.  No radiating pain down left leg during injection. Imaging Guidance (Non-Spinal):          Type of Imaging Technique: Fluoroscopy Guidance (Non-Spinal) Indication(s): Assistance in needle guidance and placement for procedures requiring needle placement in or near specific anatomical locations not easily accessible without such assistance. Exposure Time: Please see nurses notes. Contrast: Before injecting any contrast, we confirmed that the patient did not have an allergy to iodine, shellfish, or radiological contrast. Once satisfactory needle placement was completed at the desired level, radiological contrast was injected. Contrast injected under live fluoroscopy. No contrast complications. See chart for type and volume of contrast used. Fluoroscopic Guidance: I was personally present during the use of fluoroscopy. "Tunnel Vision Technique" used to obtain the best possible view of the target area.  Parallax error corrected before commencing the procedure. "Direction-depth-direction" technique used to introduce the needle under continuous pulsed fluoroscopy. Once target was reached, antero-posterior, oblique, and lateral fluoroscopic projection used confirm needle placement in all planes. Images permanently stored in EMR. Interpretation: I personally interpreted the imaging intraoperatively. Adequate needle placement confirmed in multiple planes. Appropriate spread of contrast into desired area was observed. No evidence of afferent or efferent intravascular uptake. Permanent images saved into the patient's record.  Post-operative Assessment:  Post-procedure Vital Signs:  Pulse/HCG Rate: (!) 52  Temp: (!) 97.3 F (36.3 C) Resp: 16 BP: (!) 149/82 SpO2: 100 %  EBL: None  Complications: No immediate post-treatment complications observed by team, or reported by patient.  Note: The patient tolerated the entire procedure well. A repeat set of vitals were taken after the procedure and the patient was kept under observation following institutional policy, for this type of procedure. Post-procedural neurological assessment was performed, showing return to baseline, prior to discharge. The patient was provided with post-procedure discharge instructions, including a section on how to identify potential problems. Should any problems arise concerning this procedure, the patient was given instructions to immediately contact , at any time, without hesitation. In any case, we plan to contact the patient by telephone for a follow-up status report regarding this interventional procedure.  Comments:  No additional relevant information.  Plan of Care  Orders:  Orders Placed This Encounter  Procedures   DG PAIN CLINIC C-ARM 1-60 MIN NO REPORT    Intraoperative interpretation by procedural physician at [facility].    Standing Status:   Standing    Number of Occurrences:   1    Order Specific  Question:   Reason for exam:    Answer:   Assistance in needle guidance and placement for procedures requiring needle placement in or near specific anatomical locations not easily accessible without such  assistance.      Medications ordered for procedure: Meds ordered this encounter  Medications   iohexol (OMNIPAQUE) 180 MG/ML injection 10 mL    Must be Myelogram-compatible. If not available, you may substitute with a water-soluble, non-ionic, hypoallergenic, myelogram-compatible radiological contrast medium.   DISCONTD: lidocaine 1.5 % injection 20 mL    From block tray.   dexamethasone (DECADRON) injection 10 mg   ropivacaine (PF) 2 mg/mL (0.2%) (NAROPIN) injection 4 mL   lidocaine (XYLOCAINE) 2 % (with pres) injection 400 mg      Medications administered: We administered iohexol, dexamethasone, ropivacaine (PF) 2 mg/mL (0.2%), and lidocaine.  See the medical record for exact dosing, route, and time of administration.  Follow-up plan:   Return for Keep sch. appt.      LEFT L4/5 ESI 11/24/20: Not effective.  Left piriformis TPI #1 02/02/2021 (100% pain relief for 2 days),  #2 03/14/21, #3 04/18/21, #4 07/18/21, #5 09/05/21, #6 10/17/21, #7 11/07/21, #8 12/12/21    Recent Visits Date Type Provider Dept  11/07/21 Procedure visit Edward Jolly, MD [facility]  10/17/21 Procedure visit Edward Jolly, MD [facility]  10/12/21 Office Visit Edward Jolly, MD [facility]  Showing recent visits within past 90 days and meeting all other requirements Today's Visits Date Type Provider Dept  12/12/21 Procedure visit Edward Jolly, MD [facility]  Showing today's visits and meeting all other requirements Future Appointments Date Type Provider Dept  01/18/22 Appointment Edward Jolly, MD [facility]  Showing future appointments within next 90 days and meeting all other requirements  Disposition: Discharge home  Discharge (Date   Time):  12/12/2021; 1020 hrs.   Primary Care Physician: Daisy Floro, MD Location: [facility] Note by: Edward Jolly, MD Date: 12/12/2021; Time: 10:54 AM  Disclaimer:  Medicine is not an exact science. The only guarantee in medicine is that nothing is guaranteed. It is important to note that the decision to proceed with this intervention was based on the information collected from the patient. The Data and conclusions were drawn from the patient's questionnaire, the interview, and the physical examination. Because the information was provided in large part by the patient, it cannot be guaranteed that it has not been purposely or unconsciously manipulated. Every effort has been made to obtain as much relevant data as possible for this evaluation. It is important to note that the conclusions that lead to this procedure are derived in large part from the available data. Always take into account that the treatment will also be dependent on availability of resources and existing treatment guidelines, considered by other Pain Management Practitioners as being common knowledge and practice, at the time of the intervention. For Medico-Legal purposes, it is also important to point out that variation in procedural techniques and pharmacological choices are the acceptable norm. The indications, contraindications, technique, and results of the above procedure should only be interpreted and judged by a Board-Certified Interventional Pain Specialist with extensive familiarity and expertise in the same exact procedure and technique.

## 2021-12-13 ENCOUNTER — Telehealth: Payer: Self-pay

## 2021-12-13 NOTE — Telephone Encounter (Signed)
Post procedure phone call.  Patient states he is doing good.  

## 2022-01-04 ENCOUNTER — Other Ambulatory Visit: Payer: Medicare PPO

## 2022-01-05 ENCOUNTER — Other Ambulatory Visit: Payer: Medicare PPO

## 2022-01-09 ENCOUNTER — Ambulatory Visit (INDEPENDENT_AMBULATORY_CARE_PROVIDER_SITE_OTHER)
Admission: RE | Admit: 2022-01-09 | Discharge: 2022-01-09 | Disposition: A | Discharge status: Home / Self Care | Payer: Medicare PPO | Source: Ambulatory Visit | Attending: Cardiology | Admitting: Cardiology

## 2022-01-09 ENCOUNTER — Other Ambulatory Visit: Payer: Self-pay

## 2022-01-09 DIAGNOSIS — E782 Mixed hyperlipidemia: Secondary | ICD-10-CM | POA: Diagnosis not present

## 2022-01-09 DIAGNOSIS — I7121 Aneurysm of the ascending aorta, without rupture: Secondary | ICD-10-CM | POA: Diagnosis not present

## 2022-01-09 MED ORDER — IOHEXOL 350 MG/ML SOLN
100.0000 mL | Freq: Once | INTRAVENOUS | Status: AC | PRN
  Administered 2022-01-09: 100 mL via INTRAVENOUS

## 2022-01-10 ENCOUNTER — Telehealth: Payer: Self-pay | Admitting: *Deleted

## 2022-01-10 DIAGNOSIS — Z0189 Encounter for other specified special examinations: Secondary | ICD-10-CM

## 2022-01-10 DIAGNOSIS — I7121 Aneurysm of the ascending aorta, without rupture: Secondary | ICD-10-CM

## 2022-01-10 NOTE — Telephone Encounter (Signed)
The patient has been notified of the result and verbalized understanding.  All questions (if any) were answered. Loa Socks, LPN 05/21/7823 2:35 PM   Pt aware that per Dr. Shari Prows, we will repeat his CT Angio Chest Aorta to be done in one year.  Informed the pt that I will go ahead and place the order in the system and send a message to our CT Scheduler to call him back and arrange this appt for one year out.  Pt verbalized understanding and agrees with this plan.

## 2022-01-10 NOTE — Telephone Encounter (Signed)
-----   Message from Freada Bergeron, MD sent at 01/10/2022  5:42 PM EST ----- His ascending aortic aneurysm looks stable in size. Will continue with annual CT scans.

## 2022-01-12 ENCOUNTER — Telehealth: Payer: Self-pay | Admitting: *Deleted

## 2022-01-12 NOTE — Telephone Encounter (Signed)
-----   Message from Fabiola Backer, RT sent at 01/11/2022  8:31 AM EST ----- Regarding: RE: repeat CT ANGIO CHEST AORTA TO BE DONE IN ONE YEAR PER DR. Shari Prows I set him back up for an appointment on 01/09/2023 @ 8:30 AM. Labs set for 01/02/2023 @ 8:00 AM.   ----- Message ----- From: Loa Socks, LPN Sent: 7/32/2025   6:02 PM EST To: Jennelle Human, RT, # Subject: repeat CT ANGIO CHEST AORTA TO BE DONE IN ON#  Per Dr. Shari Prows, this pt needs to have a repeat CT Angio Chest Aorta to be done in one year. Order is in and the system and the pt is aware you will call to arrange.  BMET also placed.  Can you please call and shoot me the date when scheduled so I can let Dr. Shari Prows know for follow-up?   Thanks for all you do, Lajoyce Corners

## 2022-01-16 ENCOUNTER — Ambulatory Visit: Payer: Medicare PPO | Admitting: Student in an Organized Health Care Education/Training Program

## 2022-01-18 ENCOUNTER — Ambulatory Visit
Admission: RE | Admit: 2022-01-18 | Discharge: 2022-01-18 | Disposition: A | Discharge status: Home / Self Care | Payer: Medicare PPO | Source: Ambulatory Visit | Attending: Student in an Organized Health Care Education/Training Program | Admitting: Student in an Organized Health Care Education/Training Program

## 2022-01-18 ENCOUNTER — Other Ambulatory Visit: Payer: Self-pay

## 2022-01-18 ENCOUNTER — Encounter: Payer: Self-pay | Admitting: Student in an Organized Health Care Education/Training Program

## 2022-01-18 ENCOUNTER — Ambulatory Visit (HOSPITAL_BASED_OUTPATIENT_CLINIC_OR_DEPARTMENT_OTHER): Payer: Medicare PPO | Admitting: Student in an Organized Health Care Education/Training Program

## 2022-01-18 VITALS — BP 131/82 | HR 63 | Temp 97.2°F | Resp 16 | Ht 75.0 in | Wt 201.0 lb

## 2022-01-18 DIAGNOSIS — G5702 Lesion of sciatic nerve, left lower limb: Secondary | ICD-10-CM | POA: Diagnosis present

## 2022-01-18 DIAGNOSIS — G894 Chronic pain syndrome: Secondary | ICD-10-CM

## 2022-01-18 MED ORDER — DEXAMETHASONE SODIUM PHOSPHATE 10 MG/ML IJ SOLN: 10.0000 mg | Freq: Once | INTRAMUSCULAR | Status: DC

## 2022-01-18 MED ORDER — ROPIVACAINE HCL 2 MG/ML IJ SOLN: 9.0000 mL | Freq: Once | INTRAMUSCULAR | Status: DC

## 2022-01-18 MED ORDER — LIDOCAINE HCL (PF) 2 % IJ SOLN
INTRAMUSCULAR | Status: AC
  Filled 2022-01-18: qty 10

## 2022-01-18 MED ORDER — ROPIVACAINE HCL 2 MG/ML IJ SOLN
INTRAMUSCULAR | Status: AC
  Filled 2022-01-18: qty 20

## 2022-01-18 MED ORDER — LIDOCAINE HCL 2 % IJ SOLN: 20.0000 mL | Freq: Once | INTRAMUSCULAR | Status: DC

## 2022-01-18 MED ORDER — DEXAMETHASONE SODIUM PHOSPHATE 10 MG/ML IJ SOLN
INTRAMUSCULAR | Status: AC
  Filled 2022-01-18: qty 1

## 2022-01-18 MED ORDER — IOHEXOL 180 MG/ML  SOLN: 10.0000 mL | Freq: Once | INTRAMUSCULAR | Status: DC

## 2022-01-18 NOTE — Progress Notes (Signed)
Safety precautions to be maintained throughout the outpatient stay will include: orient to surroundings, keep bed in low position, maintain call bell within reach at all times, provide assistance with transfer out of bed and ambulation.  

## 2022-01-18 NOTE — Patient Instructions (Signed)

## 2022-01-18 NOTE — Progress Notes (Signed)
PROVIDER NOTE: Information contained herein reflects review and annotations entered in association with encounter. Interpretation of such information and data should be left to medically-trained personnel. Information provided to patient can be located elsewhere in the medical record under "Patient Instructions". Document created using STT-dictation technology, any transcriptional errors that may result from process are unintentional.    Patient: Patrick Moyer  Service Category: Procedure  Provider: Edward JollyBilal Danell Vazquez, MD  DOB: 15-Dec-1951  DOS: 01/18/2022  Location: [facility]  MRN: [id number]  Setting: Ambulatory - outpatient  Referring Provider: Daisy Florooss, Charles Alan, MD  Type: Established Patient  Specialty: Interventional Pain Management  PCP: Daisy Florooss, Charles Alan, MD   Primary Reason for Visit: Interventional Pain Management Treatment. CC: Pain (Piriformis syndrome of left side)   Procedure:          Anesthesia, Analgesia, Anxiolysis:  Type: Left piriformis trigger point injection #9 under fluoroscopic guidance Region: Inferior Lumbosacral Region Level: PIIS (Posterior Inferior Iliac Spine) 1 cm deep, 1 cm lateral and 1 cm inferior from posterior inferior fissure of SI joint. Laterality: Left-Side    Position: Prone           Indications: 1. Piriformis syndrome of left side   2. Chronic pain syndrome      Pain Score: Pre-procedure: 3/10 Post-procedure: 1 /10   Pre-op H&P Assessment:  Patrick Moyer is a 71 y.o. (year old), male patient, seen today for interventional treatment. He  has a past surgical history that includes Nasal septum surgery (1975); Wisdom tooth extraction (1975); Finger exploration (Left, 1995); Laparoscopic appendectomy (2010); Inguinal hernia repair (Left, 1978); and Transurethral resection of prostate (N/A, 05/05/2020). Patrick Moyer has a current medication list which includes the following prescription(s): acetaminophen, acyclovir ointment, biotene dry mouth,  b complex vitamins, ca carbonate-mag hydroxide, celecoxib, cholecalciferol, fexofenadine, fiber, fluticasone, gabapentin, glucosamine-chondroitin, guaifenesin, l-lysine, montelukast, nystatin, omeprazole, rosuvastatin, and valacyclovir hcl, and the following Facility-Administered Medications: dexamethasone, iohexol, lidocaine, and ropivacaine (pf) 2 mg/ml (0.2%). His primarily concern today is the Pain (Piriformis syndrome of left side)   Initial Vital Signs:  Pulse/HCG Rate: 64  Temp: (!) 97.2 F (36.2 C) Resp: 16 BP: 131/71 SpO2: 100 %  BMI: Estimated body mass index is 25.12 kg/m as calculated from the following:   Height as of this encounter: 6\' 3"  (1.905 m).   Weight as of this encounter: 201 lb (91.2 kg).  Risk Assessment: Allergies: Reviewed. He is allergic to other, cialis [tadalafil], rosuvastatin, sildenafil, and soy protein [soybean oil].  Allergy Precautions: None required Coagulopathies: Reviewed. None identified.  Blood-thinner therapy: None at this time Active Infection(s): Reviewed. None identified. Patrick Moyer is afebrile  Site Confirmation: Patrick Moyer was asked to confirm the procedure and laterality before marking the site Procedure checklist: Completed Consent: Before the procedure and under the influence of no sedative(s), amnesic(s), or anxiolytics, the patient was informed of the treatment options, risks and possible complications. To fulfill our ethical and legal obligations, as recommended by the American Medical Association's Code of Ethics, I have informed the patient of my clinical impression; the nature and purpose of the treatment or procedure; the risks, benefits, and possible complications of the intervention; the alternatives, including doing nothing; the risk(s) and benefit(s) of the alternative treatment(s) or procedure(s); and the risk(s) and benefit(s) of doing nothing. The patient was provided information about the general risks and possible  complications associated with the procedure. These may include, but are not limited to: failure to achieve desired goals, infection, bleeding, organ or  nerve damage, allergic reactions, paralysis, and death. In addition, the patient was informed of those risks and complications associated to the procedure, such as failure to decrease pain; infection; bleeding; organ or nerve damage with subsequent damage to sensory, motor, and/or autonomic systems, resulting in permanent pain, numbness, and/or weakness of one or several areas of the body; allergic reactions; (i.e.: anaphylactic reaction); and/or death. Furthermore, the patient was informed of those risks and complications associated with the medications. These include, but are not limited to: allergic reactions (i.e.: anaphylactic or anaphylactoid reaction(s)); adrenal axis suppression; blood sugar elevation that in diabetics may result in ketoacidosis or comma; water retention that in patients with history of congestive heart failure may result in shortness of breath, pulmonary edema, and decompensation with resultant heart failure; weight gain; swelling or edema; medication-induced neural toxicity; particulate matter embolism and blood vessel occlusion with resultant organ, and/or nervous system infarction; and/or aseptic necrosis of one or more joints. Finally, the patient was informed that Medicine is not an exact science; therefore, there is also the possibility of unforeseen or unpredictable risks and/or possible complications that may result in a catastrophic outcome. The patient indicated having understood very clearly. We have given the patient no guarantees and we have made no promises. Enough time was given to the patient to ask questions, all of which were answered to the patient's satisfaction. Patrick Moyer has indicated that he wanted to continue with the procedure. Attestation: I, the ordering provider, attest that I have discussed with the patient  the benefits, risks, side-effects, alternatives, likelihood of achieving goals, and potential problems during recovery for the procedure that I have provided informed consent. Date   Time: 01/18/2022  9:49 AM  Pre-Procedure Preparation:  Monitoring: As per clinic protocol. Respiration, ETCO2, SpO2, BP, heart rate and rhythm monitor placed and checked for adequate function Safety Precautions: Patient was assessed for positional comfort and pressure points before starting the procedure. Time-out: I initiated and conducted the "Time-out" before starting the procedure, as per protocol. The patient was asked to participate by confirming the accuracy of the "Time Out" information. Verification of the correct person, site, and procedure were performed and confirmed by me, the nursing staff, and the patient. "Time-out" conducted as per Joint Commission's Universal Protocol (UP.01.01.01). Time: 1009  Description of Procedure:          Target Area: 1 cm deep, 1 cm lateral, 1 cm inferior to the Inferior, posterior, aspect of the sacroiliac fissure Approach: Posterior, paraspinal, ipsilateral approach. Area Prepped: Entire Lower Lumbosacral Region DuraPrep (Iodine Povacrylex [0.7% available iodine] and Isopropyl Alcohol, 74% w/w) Safety Precautions: Aspiration looking for blood return was conducted prior to all injections. At no point did we inject any substances, as a needle was being advanced. No attempts were made at seeking any paresthesias. Safe injection practices and needle disposal techniques used. Medications properly checked for expiration dates. SDV (single dose vial) medications used. Description of the Procedure: Protocol guidelines were followed. The patient was placed in position over the procedure table. The target area was identified and the area prepped in the usual manner. Skin & deeper tissues infiltrated with local anesthetic. Appropriate amount of time allowed to pass for local anesthetics to  take effect. The procedure needle was advanced under fluoroscopic guidance into the sacroiliac joint until a firm endpoint was obtained. Proper needle placement secured. Negative aspiration confirmed. Solution injected in intermittent fashion, asking for systemic symptoms every 0.5cc of injectate. The needles were then removed and the area  cleansed, making sure to leave some of the prepping solution back to take advantage of its long term bactericidal properties. Vitals:   01/18/22 0951 01/18/22 1009 01/18/22 1016  BP: 131/71 129/84 131/82  Pulse: 64 61 63  Resp: 16 18 16   Temp: (!) 97.2 F (36.2 C)    TempSrc: Temporal    SpO2: 100% 100% 100%  Weight: 201 lb (91.2 kg)    Height: 6\' 3"  (1.905 m)         Start Time: 1009 hrs. End Time: 1013 hrs. Materials:  Needle(s) Type: Spinal Needle Gauge: 25G Length: 3.5-in Medication(s): Please see orders for medications and dosing details.  7cc solution made of 6cc of 0.2% ropivacaine, 1 cc of Decadron 10 mg/cc.  Injected into the left piriformis muscle after contrast confirmation highlighting piriformis muscle striations.  No radiating pain down left leg during injection. Imaging Guidance (Non-Spinal):          Type of Imaging Technique: Fluoroscopy Guidance (Non-Spinal) Indication(s): Assistance in needle guidance and placement for procedures requiring needle placement in or near specific anatomical locations not easily accessible without such assistance. Exposure Time: Please see nurses notes. Contrast: Before injecting any contrast, we confirmed that the patient did not have an allergy to iodine, shellfish, or radiological contrast. Once satisfactory needle placement was completed at the desired level, radiological contrast was injected. Contrast injected under live fluoroscopy. No contrast complications. See chart for type and volume of contrast used. Fluoroscopic Guidance: I was personally present during the use of fluoroscopy. "Tunnel  Vision Technique" used to obtain the best possible view of the target area. Parallax error corrected before commencing the procedure. "Direction-depth-direction" technique used to introduce the needle under continuous pulsed fluoroscopy. Once target was reached, antero-posterior, oblique, and lateral fluoroscopic projection used confirm needle placement in all planes. Images permanently stored in EMR. Interpretation: I personally interpreted the imaging intraoperatively. Adequate needle placement confirmed in multiple planes. Appropriate spread of contrast into desired area was observed. No evidence of afferent or efferent intravascular uptake. Permanent images saved into the patient's record.  Post-operative Assessment:  Post-procedure Vital Signs:  Pulse/HCG Rate: 63 (NSR)  Temp: (!) 97.2 F (36.2 C) Resp: 16 BP: 131/82 SpO2: 100 %  EBL: None  Complications: No immediate post-treatment complications observed by team, or reported by patient.  Note: The patient tolerated the entire procedure well. A repeat set of vitals were taken after the procedure and the patient was kept under observation following institutional policy, for this type of procedure. Post-procedural neurological assessment was performed, showing return to baseline, prior to discharge. The patient was provided with post-procedure discharge instructions, including a section on how to identify potential problems. Should any problems arise concerning this procedure, the patient was given instructions to immediately contact , at any time, without hesitation. In any case, we plan to contact the patient by telephone for a follow-up status report regarding this interventional procedure.  Comments:  No additional relevant information.  Plan of Care  Orders:  Orders Placed This Encounter  Procedures   TRIGGER POINT INJECTION    Area: Buttocks region (gluteal area) Indications: Piriformis muscle pain; Left (G57.02)  piriformis-syndrome; piriformis muscle spasms ). CPT code: [place]    Standing Status:   Standing    Number of Occurrences:   12    Standing Expiration Date:   07/18/2022    Scheduling Instructions:     Left piriformis TPI    Order Specific Question:   Where will this procedure be  performed?    Answer:   [facility]   DG [facility] C-ARM 1-60 MIN NO REPORT    Intraoperative interpretation by procedural physician at [facility].    Standing Status:   Standing    Number of Occurrences:   1    Order Specific Question:   Reason for exam:    Answer:   Assistance in needle guidance and placement for procedures requiring needle placement in or near specific anatomical locations not easily accessible without such assistance.      Medications ordered for procedure: Meds ordered this encounter  Medications   iohexol (OMNIPAQUE) 180 MG/ML injection 10 mL    Must be Myelogram-compatible. If not available, you may substitute with a water-soluble, non-ionic, hypoallergenic, myelogram-compatible radiological contrast medium.   lidocaine (XYLOCAINE) 2 % (with pres) injection 400 mg   ropivacaine (PF) 2 mg/mL (0.2%) (NAROPIN) injection 9 mL   dexamethasone (DECADRON) injection 10 mg      Medications administered: Dorian HeckleEdwin O. Friede had no medications administered during this visit.  See the medical record for exact dosing, route, and time of administration.  Follow-up plan:   Return in about 4 weeks (around 02/15/2022) for Left Piriformis TPI.      LEFT L4/5 ESI 11/24/20: Not effective.  Left piriformis TPI #1 02/02/2021 (100% pain relief for 2 days),  #2 03/14/21, #3 04/18/21, #4 07/18/21, #5 09/05/21, #6 10/17/21, #7 11/07/21, #8 12/12/21, #9 01/18/22    Recent Visits Date Type Provider Dept  12/12/21 Procedure visit Edward JollyLateef, Kristy Schomburg, MD [facility]  11/07/21 Procedure visit Edward JollyLateef, Sharis Keeran, MD [facility]  Showing recent visits within past 90 days and meeting  all other requirements Today's Visits Date Type Provider Dept  01/18/22 Procedure visit Edward JollyLateef, Khilynn Borntreger, MD [facility]  Showing today's visits and meeting all other requirements Future Appointments Date Type Provider Dept  02/20/22 Appointment Edward JollyLateef, Diar Berkel, MD [facility]  Showing future appointments within next 90 days and meeting all other requirements  Disposition: Discharge home  Discharge (Date   Time): 01/18/2022; 1016 hrs.   Primary Care Physician: Daisy Florooss, Charles Alan, MD [facility] Note by: Edward JollyBilal Tasha Jindra, MD Date: 01/18/2022; Time: 10:34 AM  Disclaimer:  Medicine is not an exact science. The only guarantee in medicine is that nothing is guaranteed. It is important to note that the decision to proceed with this intervention was based on the information collected from the patient. The Data and conclusions were drawn from the patient's questionnaire, the interview, and the physical examination. Because the information was provided in large part by the patient, it cannot be guaranteed that it has not been purposely or unconsciously manipulated. Every effort has been made to obtain as much relevant data as possible for this evaluation. It is important to note that the conclusions that lead to this procedure are derived in large part from the available data. Always take into account that the treatment will also be dependent on availability of resources and existing treatment guidelines, considered by other Pain Management Practitioners as being common knowledge and practice, at the time of the intervention. For Medico-Legal purposes, it is also important to point out that variation in procedural techniques and pharmacological choices are the acceptable norm. The indications, contraindications, technique, and results of the above procedure should only be interpreted and judged by a Board-Certified Interventional Pain Specialist with  extensive familiarity and expertise in the same exact procedure and technique.

## 2022-01-19 ENCOUNTER — Telehealth: Payer: Self-pay | Admitting: *Deleted

## 2022-01-19 NOTE — Telephone Encounter (Signed)
Post procedure call;  patient reports that he is doing well.  

## 2022-01-25 ENCOUNTER — Other Ambulatory Visit: Payer: Self-pay

## 2022-01-25 MED ORDER — ROSUVASTATIN CALCIUM 5 MG PO TABS: ORAL_TABLET | ORAL | 8 refills | Status: DC

## 2022-01-29 IMAGING — MR MR CERVICAL SPINE W/O CM
5 series · 35 of 48 positions shown · non-contrast
Comparison: MRI/MRA head 10/16/2016.

CLINICAL DATA: Chronic pain syndrome. Numbness and tingling in left
hand. Cervical radicular pain. Cervical radiculopathy, no red flags.
Additional history provided by scanning technologist: Patient
reports numbness in left thumb, neck stiffness.

EXAM:
MRI CERVICAL SPINE WITHOUT CONTRAST
TECHNIQUE: Multiplanar, multisequence MR imaging of the cervical spine was
performed. No intravenous contrast was administered.

[Series 5: T2 · sagittal · 3.0mm · 0.62mm/px · 6 of 15 slices shown (1 of 2)]
[im 1/15]
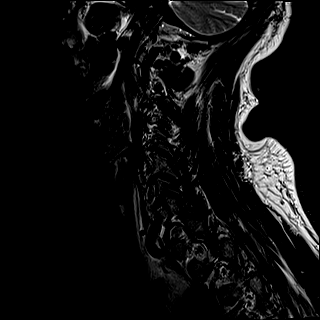
[im 3/15]
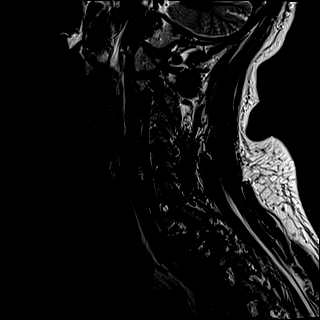
[im 6/15]
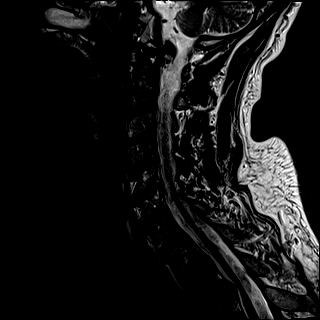
[im 9/15]
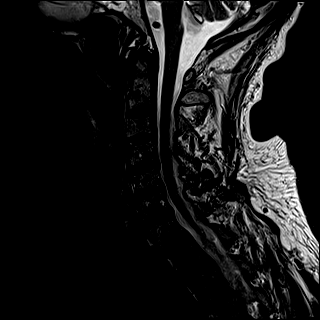
[im 12/15]
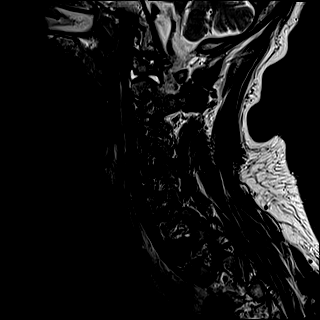
[im 15/15]
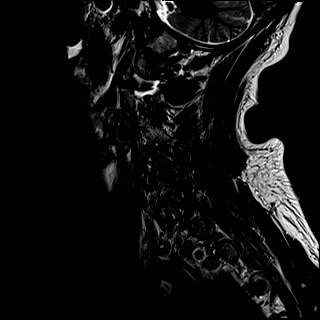

[Series 6: FLAIR · sagittal · 3.0mm · 0.78mm/px · 6 of 15 slices shown]
[im 1/15]
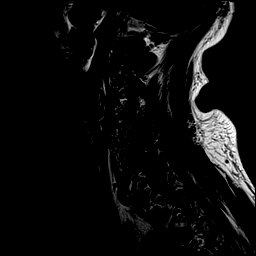
[im 3/15]
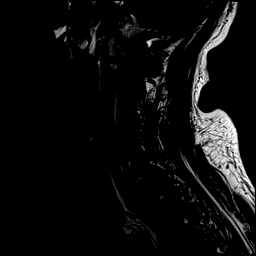
[im 6/15]
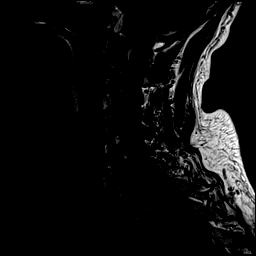
[im 9/15]
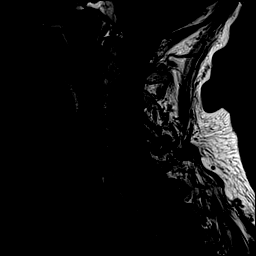
[im 12/15]
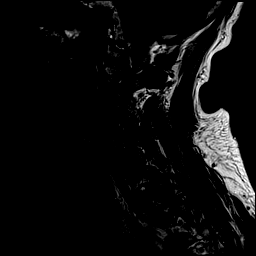
[im 15/15]
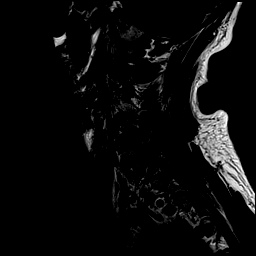

[Series 7: STIR · sagittal · 3.0mm · 0.62mm/px · 7 of 15 slices shown]
[im 1/15]
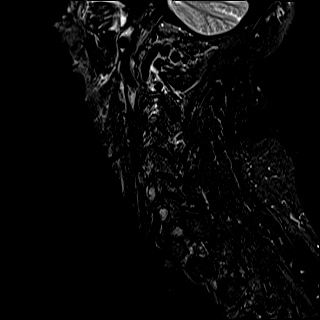
[im 3/15]
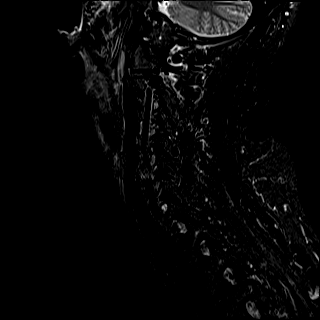
[im 5/15]
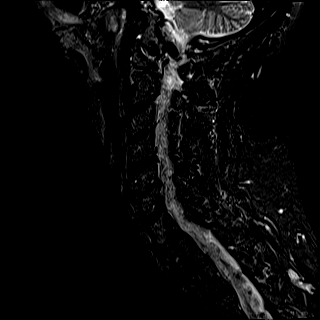
[im 8/15]
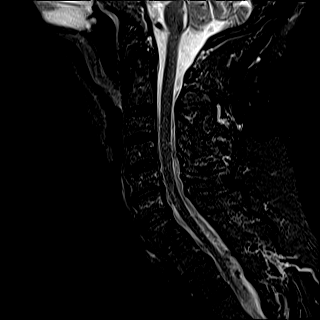
[im 10/15]
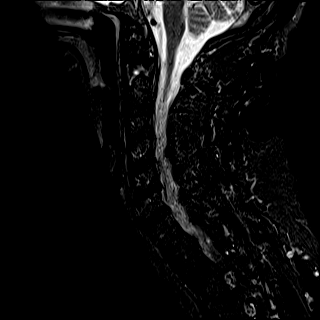
[im 12/15]
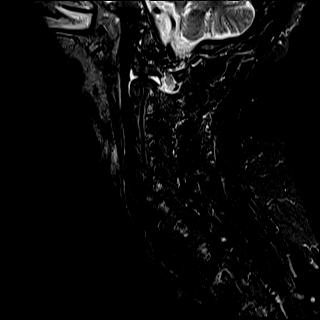
[im 15/15]
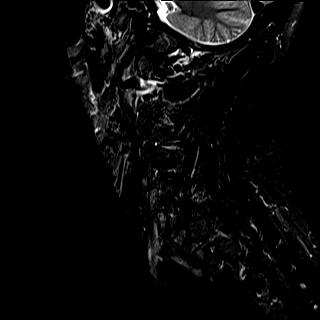

[Series 8: T2 · axial · 3.0mm · 0.70mm/px · z∈[-88,+34]mm · 8 of 31 slices shown (2 of 2)]
[im 1/31]
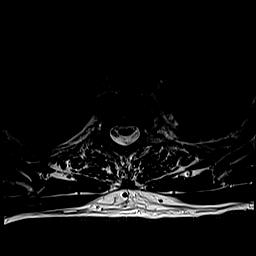
[im 5/31]
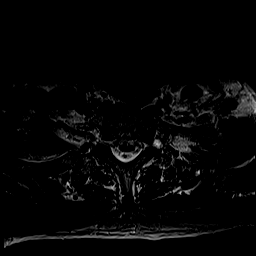
[im 10/31]
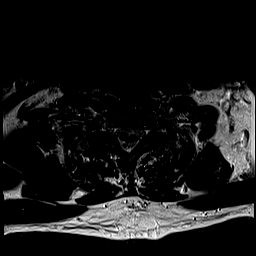
[im 14/31]
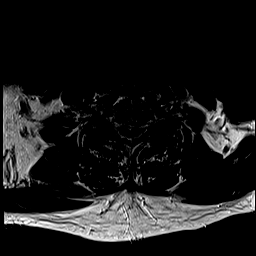
[im 17/31]
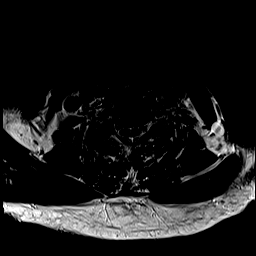
[im 21/31]
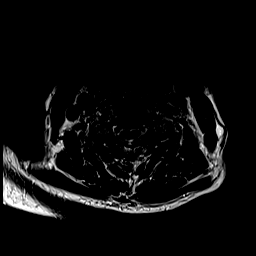
[im 26/31]
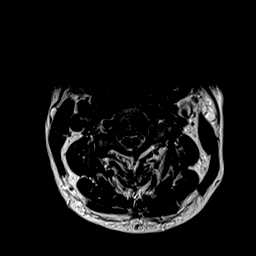
[im 31/31]
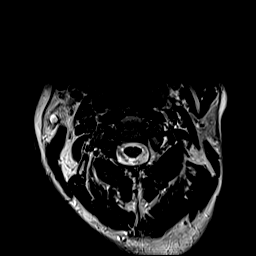

[Series 9: ax mpgr · axial · 3.0mm · 0.35mm/px · z∈[-88,+34]mm · 8 of 33 slices shown]
[im 1/33]
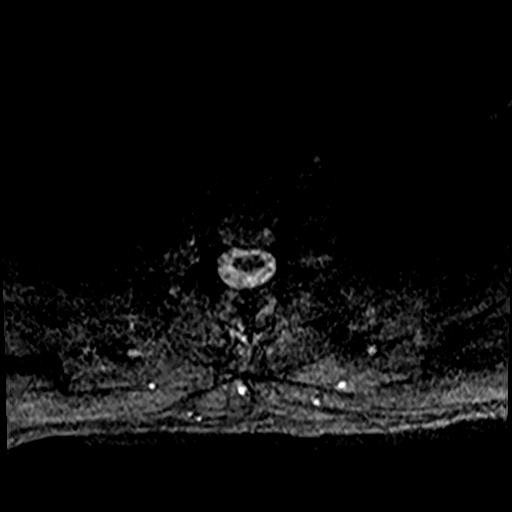
[im 5/33]
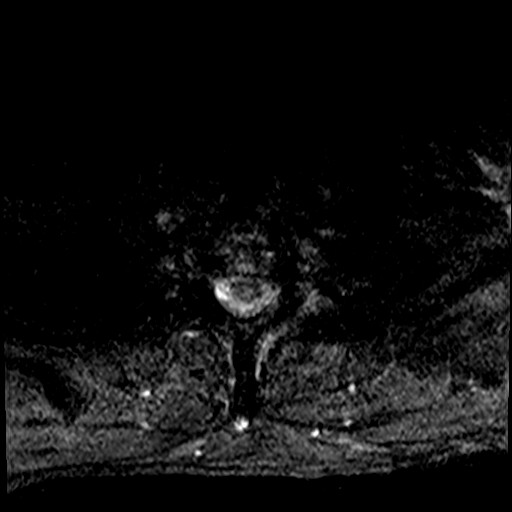
[im 10/33]
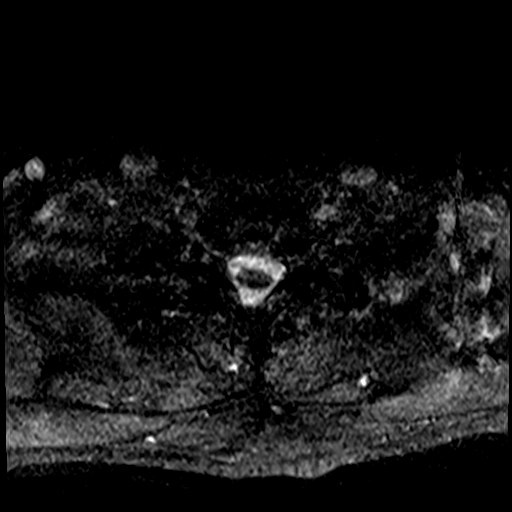
[im 14/33]
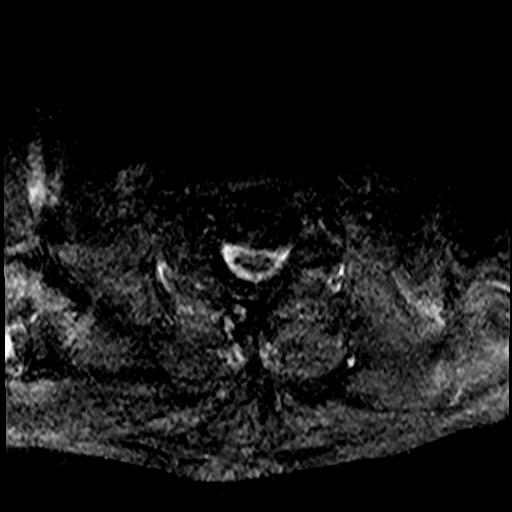
[im 19/33]
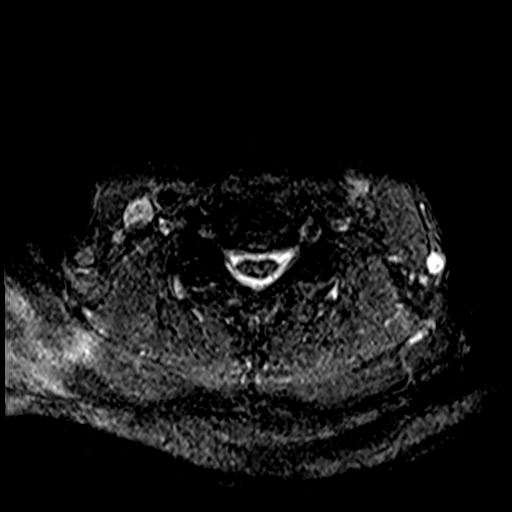
[im 23/33]
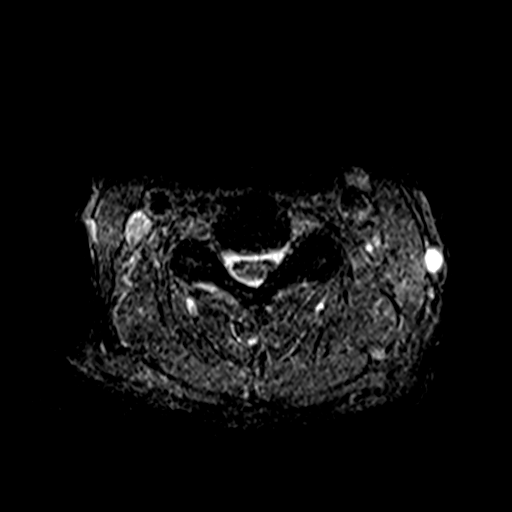
[im 28/33]
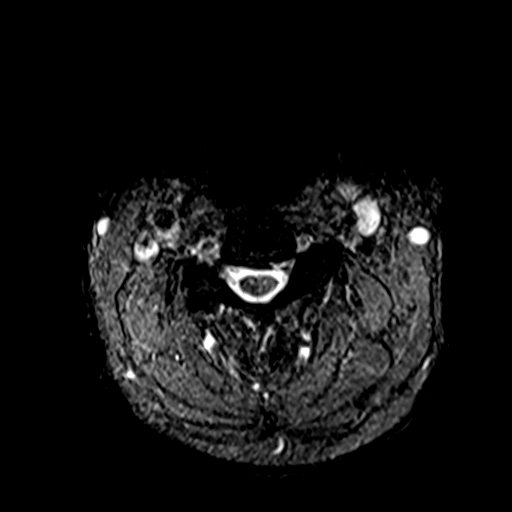
[im 33/33]
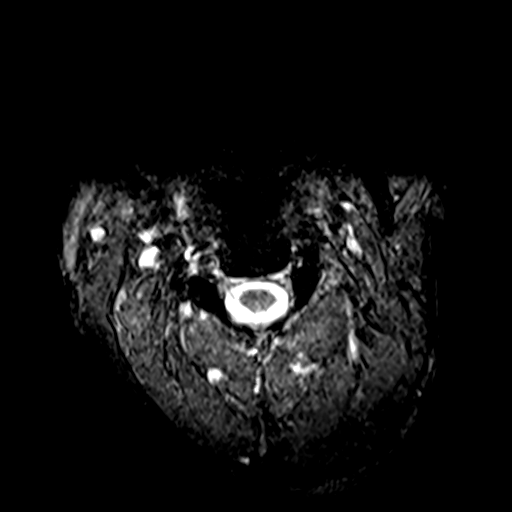

[35 of 48 positions shown; findings below may reference images not displayed]

FINDINGS: Alignment: No significant spondylolisthesis.

Vertebrae: No significant marrow edema or focal suspicious osseous
lesion.

Cord: No spinal cord signal abnormality is identified.

Posterior Fossa, vertebral arteries, paraspinal tissues: No
abnormality identified within included portions of the posterior
fossa. Flow voids preserved within the imaged cervical vertebral
arteries. Paraspinal soft tissues within normal limits.

Disc levels:

Moderate C5-C6 disc degeneration. Mild disc degeneration at the
remaining cervical levels.

Small left atlantoaxial joint effusion (for instance as seen on
series 5, image 13)

C2-C3: No significant disc herniation or stenosis.

C3-C4: Shallow disc bulge. Uncinate and facet hypertrophy
(predominantly on the left). No significant spinal canal stenosis.
Moderate left neural foraminal narrowing.

C4-C5: Disc bulge. Facet and ligamentum flavum hypertrophy. Mild
relative spinal canal narrowing. The disc bulge partially effaces
the ventral thecal sac. No spinal cord mass effect. Mild/moderate
bilateral neural foraminal narrowing.

C5-C6: Disc bulge. Uncovertebral, facet and ligamentum flavum
hypertrophy. Mild relative spinal canal narrowing. Moderate/severe
right neural foraminal narrowing. The left neural foramen is patent.

C6-C7: Disc bulge. Uncovertebral, facet and ligamentum flavum
hypertrophy. The disc bulge minimally effaces the ventral thecal
sac. No spinal cord mass effect. Mild/moderate left neural foraminal
narrowing.

C7-T1: Minimal disc bulge. Mild facet hypertrophy. No significant
spinal canal or foraminal stenosis.

T1-T2: Shallow left center disc protrusion. The disc protrusion
partially effaces the ventral thecal sac. No spinal cord mass
effect. No significant foraminal stenosis.

T2-T3: This level is imaged sagittally. A left center disc
protrusion effaces the ventral thecal sac, contacting and mildly
flattening the left ventral spinal cord (series 5, images 6 and 7).
No significant foraminal stenosis.
IMPRESSION: Cervical and upper thoracic spondylosis as outlined.

No more than mild relative spinal canal narrowing within the
cervical spine. Multilevel neural foraminal narrowing greatest on
the left at C3-C4 (moderate), bilaterally at C4-C5 (mild/moderate),
on the right at C5-C6 (moderate/severe) and on the left at C6-C7
(mild/moderate).

Moderate C5-C6 disc degeneration. No more than mild disc
degeneration at the remaining levels.

Central disc protrusions at T1-T2 and T2-T3. The T2-T3 disc
protrusion contacts and mildly flattens the ventral spinal cord.

## 2022-02-13 ENCOUNTER — Ambulatory Visit: Payer: Medicare PPO | Admitting: Student in an Organized Health Care Education/Training Program

## 2022-02-13 NOTE — Progress Notes (Deleted)
Cardiology Office Note:    Date:  02/13/2022   ID:  Patrick Moyer, DOB [birth year], MRN [id number]  PCP:  Lawerance Cruel, MD   [facility] Cardiologist:  Freada Bergeron, MD {   Referring MD: Lawerance Cruel, MD    History of Present Illness:    Patrick Moyer is a 71 y.o. male with a hx of ascending aortic aneurysm, nonobstructive coronary artery disease, marfanoid habitus, hyperlipidemia who was previously followed by Dr. Meda Moyer who now returns to clinic for follow-up.  Per review of the record, the patient has a very strong family history of Marfan syndrome. His brother was diagnosed with Marfan syndrome, father died of ascending aortic dissection and coronary dissection, daughter is unusually tall with long arm span than body legs when she was growing up.   Previous coronary CTA 10/14/2019 with coronary calcium score 57 placing him in the 41st percentile for age and sex matched control.  He had mildly dilated aortic root at the sinus level with maximum diameter 43 mm.  Last saw Patrick Moyer in 08/2021 where he was doing very well without any anginal symptoms. Remained very active.  Today, ***  Past Medical History:  Diagnosis Date   ALLERGIC RHINITIS    Arthritis pain of wrist    Bell's palsy    per pt 1987 left side, 20% residual effect's eyelid   Benign localized prostatic hyperplasia with lower urinary tract symptoms (LUTS)    Chronic bilateral low back pain with bilateral sciatica    Diverticulosis    ED (erectile dysfunction)    Environmental and seasonal allergies    Family history of Marfan syndrome    father, paternal uncle, and brother   GERD (gastroesophageal reflux disease)    Hiatal hernia    High cholesterol    History of closed head injury    04-28-2020 per pt yrs ago w/ no loc, residual mild memory loss which has resolved   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 62   per pt treated with INH for one year and cxr's since have been normal    HSV-1 infection    Marfanoid habitus    per pt has wing span is longer than his height, effects eyes (iris), hyperflexibility of ankle and hands,  AAA   Mild CAD    cardiologist--  dr Patrick Moyer---  CTA 10-22-2019   Non-celiac gluten sensitivity    Numbness and tingling in both hands    per pt intermittantly due to cervical spine   OSA on CPAP    auto 8-10,  pt stated uses every night   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)   Polyneuropathy    left leg, lumbar nerve    Pulmonary nodules    per pt followed by pcp (CTA and CT chest 10-22-2019 , stable)   Radiculopathy of leg    Thoracic ascending aortic aneurysm    cardiologist--- dr Liane Comber--- CTA 10-22-2019  ascending aorta 1mm and aortic root 9mm   Wears glasses     Past Surgical History:  Procedure Laterality Date   FINGER EXPLORATION Left 1995   left middle finger digital nerve repair   INGUINAL HERNIA REPAIR Left 1978   LAPAROSCOPIC APPENDECTOMY  2010   NASAL SEPTUM SURGERY  1975   TRANSURETHRAL RESECTION OF PROSTATE N/A 05/05/2020   Procedure: TRANSURETHRAL RESECTION OF THE PROSTATE (TURP), BIPOLAR;  Surgeon: Ceasar Mons, MD;  Location:  [facility];  Service: Urology;  Laterality: N/A;   WISDOM TOOTH EXTRACTION  1975    Current Medications: No outpatient medications have been marked as taking for the 02/21/22 encounter (Appointment) with Freada Bergeron, MD.     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    Employer: [employer]  Tobacco Use   Smoking status: Never   Smokeless tobacco: Never  Vaping Use   Vaping Use: Never used   Substance and Sexual Activity   Alcohol use: No   Drug use: Never   Sexual activity: Not on file  Other Topics Concern   Not on file  Social History Narrative   Not on file   Social Determinants of Health   Financial Resource Strain: Not on file  Food Insecurity: Not on file  Transportation Needs: Not on file  Physical Activity: Not on file  Stress: Not on file  Social Connections: Not on file     Family History: The patient's ***family history includes Aortic aneurysm in his father; Bipolar disorder in his brother; CAD in his father; Glaucoma in his brother; Hypertension in his father; Marfan syndrome in his brother; Obesity in his brother; Osteoarthritis in his sister; Ovarian cancer in his mother.  ROS:   Please see the history of present illness.    *** All other systems reviewed and are negative.  EKGs/Labs/Other Studies Reviewed:    The following studies were reviewed today: Coronary CTA: 10/22/2019   1. Coronary calcium score of 57. This was 78 percentile for age and sex matched control.   2. Normal coronary origin with right dominance.   3. CAD-RADS 1. Minimal non-obstructive CAD (0-24%) in the proximal LAD. Consider non-atherosclerotic causes of chest pain. Consider preventive therapy and risk factor modification.   4. Mildly dilated aortic root at the sinus level with maximum diameter 43 mm, upper normal size of the ascending aorta measuring 40 mm. When compared to the prior study from 05/07/2018 there has been no change.  EKG:  EKG is *** ordered today.  The ekg ordered today demonstrates ***  Recent Labs: No results found for requested labs within last 8760 hours.  Recent Lipid Panel    Component Value Date/Time   CHOL 174 12/27/2020 1216   TRIG 69 12/27/2020 1216   HDL 49 12/27/2020 1216   CHOLHDL 3.6 12/27/2020 1216   LDLCALC 112 (H) 12/27/2020 1216     Risk Assessment/Calculations:   {Does this patient have ATRIAL  FIBRILLATION?:[id number]}       Physical Exam:    VS:  There were no vitals taken for this visit.    Wt Readings from Last 3 Encounters:  01/18/22 201 lb (91.2 kg)  12/12/21 202 lb (91.6 kg)  11/07/21 198 lb (89.8 kg)     GEN: *** Well nourished, well developed in no acute distress HEENT: Normal NECK: No JVD; No carotid bruits LYMPHATICS: No lymphadenopathy CARDIAC: ***RRR, no murmurs, rubs, gallops RESPIRATORY:  Clear to auscultation without rales, wheezing or rhonchi  ABDOMEN: Soft, non-tender, non-distended MUSCULOSKELETAL:  No edema; No deformity  SKIN: Warm and dry NEUROLOGIC:  Alert and oriented x 3 PSYCHIATRIC:  Normal  affect   ASSESSMENT:    No diagnosis found. PLAN:    In order of problems listed above:  #Nonobstructive CAD: Cardiac CTA 10/22/19 with coronary calcium score of 57 and minimal nonobstructive disease <25% in LAD. Stable with no anginal symptoms. Remains very active.   #Ascending Aortic Aneurysm: #Strong Family History of Marfan Syndrome: Chest CTA 04/27/2018 ascending aortic dimension 41 mm and aortic root dimension at the sinuses level 43 mm. Chest CTA 09/2019 unchanged diameter at sense of a 43 mm well maximum diameter ascending aorta was 40 mm. Repeat CT 12/2020 with stable 3.9 cm ascending aorta, 3.1 cm arch aneurysm without complicating feature.   -Continue annual monitoring  #HLD: -Continue crestor 5mg  daily -Continue lifestyle modifications -Goal LDL<70     {Are you ordering a CV Procedure (e.g. stress test, cath, DCCV, TEE, etc)?   Press F2        :[id number]    Medication Adjustments/Labs and Tests Ordered: Current medicines are reviewed at length with the patient today.  Concerns regarding medicines are outlined above.  No orders of the defined types were placed in this encounter.  No orders of the defined types were placed in this encounter.   There are no Patient Instructions on file for this visit.   Signed, Freada Bergeron, MD  02/13/2022 7:30 PM    [place]

## 2022-02-20 ENCOUNTER — Encounter: Payer: Self-pay | Admitting: Student in an Organized Health Care Education/Training Program

## 2022-02-20 ENCOUNTER — Other Ambulatory Visit: Payer: Self-pay

## 2022-02-20 ENCOUNTER — Ambulatory Visit
Admission: RE | Admit: 2022-02-20 | Discharge: 2022-02-20 | Disposition: A | Discharge status: Home / Self Care | Payer: Medicare PPO | Source: Ambulatory Visit | Attending: Student in an Organized Health Care Education/Training Program | Admitting: Student in an Organized Health Care Education/Training Program

## 2022-02-20 ENCOUNTER — Ambulatory Visit (HOSPITAL_BASED_OUTPATIENT_CLINIC_OR_DEPARTMENT_OTHER): Payer: Medicare PPO | Admitting: Student in an Organized Health Care Education/Training Program

## 2022-02-20 VITALS — BP 136/81 | HR 59 | Temp 97.5°F | Resp 16 | Ht 75.0 in | Wt 199.0 lb

## 2022-02-20 DIAGNOSIS — G5702 Lesion of sciatic nerve, left lower limb: Secondary | ICD-10-CM | POA: Insufficient documentation

## 2022-02-20 DIAGNOSIS — G894 Chronic pain syndrome: Secondary | ICD-10-CM

## 2022-02-20 MED ORDER — LIDOCAINE HCL 2 % IJ SOLN
INTRAMUSCULAR | Status: AC
  Filled 2022-02-20: qty 20

## 2022-02-20 MED ORDER — DEXAMETHASONE SODIUM PHOSPHATE 10 MG/ML IJ SOLN
10.0000 mg | Freq: Once | INTRAMUSCULAR | Status: AC
  Administered 2022-02-20: 10 mg

## 2022-02-20 MED ORDER — LIDOCAINE HCL 2 % IJ SOLN
20.0000 mL | Freq: Once | INTRAMUSCULAR | Status: AC
  Administered 2022-02-20: 400 mg

## 2022-02-20 MED ORDER — ROPIVACAINE HCL 2 MG/ML IJ SOLN
INTRAMUSCULAR | Status: AC
  Filled 2022-02-20: qty 20

## 2022-02-20 MED ORDER — DEXAMETHASONE SODIUM PHOSPHATE 10 MG/ML IJ SOLN
INTRAMUSCULAR | Status: AC
  Filled 2022-02-20: qty 1

## 2022-02-20 MED ORDER — IOHEXOL 180 MG/ML  SOLN
10.0000 mL | Freq: Once | INTRAMUSCULAR | Status: AC
  Administered 2022-02-20: 5 mL via INTRA_ARTICULAR

## 2022-02-20 MED ORDER — ROPIVACAINE HCL 2 MG/ML IJ SOLN
4.0000 mL | Freq: Once | INTRAMUSCULAR | Status: AC
  Administered 2022-02-20: 20 mL via INTRA_ARTICULAR

## 2022-02-20 NOTE — Progress Notes (Signed)
PROVIDER NOTE: Information contained herein reflects review and annotations entered in association with encounter. Interpretation of such information and data should be left to medically-trained personnel. Information provided to patient can be located elsewhere in the medical record under "Patient Instructions". Document created using STT-dictation technology, any transcriptional errors that may result from process are unintentional.    Patient: Patrick Moyer  Service Category: Procedure  Provider: Edward Jolly, MD  DOB: 1951/04/28  DOS: 02/20/2022  Location: [facility]  MRN: [id number]  Setting: Ambulatory - outpatient  Referring Provider: Daisy Floro, MD  Type: Established Patient  Specialty: Interventional Pain Management  PCP: Daisy Floro, MD   Primary Reason for Visit: Interventional Pain Management Treatment. CC: Other (Nerve pain in left butt )   Procedure:          Anesthesia, Analgesia, Anxiolysis:  Type: Left piriformis trigger point injection #10 under fluoroscopic guidance Region: Inferior Lumbosacral Region Level: PIIS (Posterior Inferior Iliac Spine) 1 cm deep, 1 cm lateral and 1 cm inferior from posterior inferior fissure of SI joint. Laterality: Left-Side    Position: Prone           Indications: 1. Piriformis syndrome of left side   2. Chronic pain syndrome       Pain Score: Pre-procedure: 3/10 Post-procedure: 0-No pain/10   Pre-op H&P Assessment:  Mr. Trzcinski is a 71 y.o. (year old), male patient, seen today for interventional treatment. He  has a past surgical history that includes Nasal septum surgery (1975); Wisdom tooth extraction (1975); Finger exploration (Left, 1995); Laparoscopic appendectomy (2010); Inguinal hernia repair (Left, 1978); and Transurethral resection of prostate (N/A, 05/05/2020). Mr. Raifsnider has a current medication list which includes the following prescription(s): acetaminophen, acyclovir ointment, biotene dry  mouth, b complex vitamins, ca carbonate-mag hydroxide, celecoxib, cholecalciferol, fexofenadine, fiber, fluticasone, gabapentin, glucosamine-chondroitin, guaifenesin, l-lysine, montelukast, nystatin, omeprazole, rosuvastatin, sodium fluoride 5000 ppm, and valacyclovir hcl. His primarily concern today is the Other (Nerve pain in left butt )   Initial Vital Signs:  Pulse/HCG Rate: 65  Temp: (!) 97.5 F (36.4 C) Resp: 16 BP: 132/74 SpO2: 98 %  BMI: Estimated body mass index is 24.87 kg/m as calculated from the following:   Height as of this encounter: 6\' 3"  (1.905 m).   Weight as of this encounter: 199 lb (90.3 kg).  Risk Assessment: Allergies: Reviewed. He is allergic to other, cialis [tadalafil], rosuvastatin, sildenafil, and soy protein [soybean oil].  Allergy Precautions: None required Coagulopathies: Reviewed. None identified.  Blood-thinner therapy: None at this time Active Infection(s): Reviewed. None identified. Mr. Tambasco is afebrile  Site Confirmation: Mr. Richie was asked to confirm the procedure and laterality before marking the site Procedure checklist: Completed Consent: Before the procedure and under the influence of no sedative(s), amnesic(s), or anxiolytics, the patient was informed of the treatment options, risks and possible complications. To fulfill our ethical and legal obligations, as recommended by the American Medical Association's Code of Ethics, I have informed the patient of my clinical impression; the nature and purpose of the treatment or procedure; the risks, benefits, and possible complications of the intervention; the alternatives, including doing nothing; the risk(s) and benefit(s) of the alternative treatment(s) or procedure(s); and the risk(s) and benefit(s) of doing nothing. The patient was provided information about the general risks and possible complications associated with the procedure. These may include, but are not limited to: failure to achieve  desired goals, infection, bleeding, organ or nerve damage, allergic reactions, paralysis, and death.  In addition, the patient was informed of those risks and complications associated to the procedure, such as failure to decrease pain; infection; bleeding; organ or nerve damage with subsequent damage to sensory, motor, and/or autonomic systems, resulting in permanent pain, numbness, and/or weakness of one or several areas of the body; allergic reactions; (i.e.: anaphylactic reaction); and/or death. Furthermore, the patient was informed of those risks and complications associated with the medications. These include, but are not limited to: allergic reactions (i.e.: anaphylactic or anaphylactoid reaction(s)); adrenal axis suppression; blood sugar elevation that in diabetics may result in ketoacidosis or comma; water retention that in patients with history of congestive heart failure may result in shortness of breath, pulmonary edema, and decompensation with resultant heart failure; weight gain; swelling or edema; medication-induced neural toxicity; particulate matter embolism and blood vessel occlusion with resultant organ, and/or nervous system infarction; and/or aseptic necrosis of one or more joints. Finally, the patient was informed that Medicine is not an exact science; therefore, there is also the possibility of unforeseen or unpredictable risks and/or possible complications that may result in a catastrophic outcome. The patient indicated having understood very clearly. We have given the patient no guarantees and we have made no promises. Enough time was given to the patient to ask questions, all of which were answered to the patient's satisfaction. Mr. Cavanagh has indicated that he wanted to continue with the procedure. Attestation: I, the ordering provider, attest that I have discussed with the patient the benefits, risks, side-effects, alternatives, likelihood of achieving goals, and potential problems  during recovery for the procedure that I have provided informed consent. Date   Time: 02/20/2022  8:05 AM  Pre-Procedure Preparation:  Monitoring: As per clinic protocol. Respiration, ETCO2, SpO2, BP, heart rate and rhythm monitor placed and checked for adequate function Safety Precautions: Patient was assessed for positional comfort and pressure points before starting the procedure. Time-out: I initiated and conducted the "Time-out" before starting the procedure, as per protocol. The patient was asked to participate by confirming the accuracy of the "Time Out" information. Verification of the correct person, site, and procedure were performed and confirmed by me, the nursing staff, and the patient. "Time-out" conducted as per Joint Commission's Universal Protocol (UP.01.01.01). Time: 0827  Description of Procedure:          Target Area: 1 cm deep, 1 cm lateral, 1 cm inferior to the Inferior, posterior, aspect of the sacroiliac fissure Approach: Posterior, paraspinal, ipsilateral approach. Area Prepped: Entire Lower Lumbosacral Region DuraPrep (Iodine Povacrylex [0.7% available iodine] and Isopropyl Alcohol, 74% w/w) Safety Precautions: Aspiration looking for blood return was conducted prior to all injections. At no point did we inject any substances, as a needle was being advanced. No attempts were made at seeking any paresthesias. Safe injection practices and needle disposal techniques used. Medications properly checked for expiration dates. SDV (single dose vial) medications used. Description of the Procedure: Protocol guidelines were followed. The patient was placed in position over the procedure table. The target area was identified and the area prepped in the usual manner. Skin & deeper tissues infiltrated with local anesthetic. Appropriate amount of time allowed to pass for local anesthetics to take effect. The procedure needle was advanced under fluoroscopic guidance into the sacroiliac joint  until a firm endpoint was obtained. Proper needle placement secured. Negative aspiration confirmed. Solution injected in intermittent fashion, asking for systemic symptoms every 0.5cc of injectate. The needles were then removed and the area cleansed, making sure to leave some of  the prepping solution back to take advantage of its long term bactericidal properties. Vitals:   02/20/22 0813 02/20/22 0834  BP: 132/74 136/81  Pulse: 65 (!) 59  Resp: 16   Temp: (!) 97.5 F (36.4 C)   TempSrc: Temporal   SpO2: 98% 96%  Weight: 199 lb (90.3 kg)   Height: 6\' 3"  (1.905 m)         Start Time: 0827 hrs. End Time: 0831 hrs. Materials:  Needle(s) Type: Spinal Needle Gauge: 25G Length: 3.5-in Medication(s): Please see orders for medications and dosing details.  7cc solution made of 6cc of 0.2% ropivacaine, 1 cc of Decadron 10 mg/cc.  Injected into the left piriformis muscle after contrast confirmation highlighting piriformis muscle striations.  No radiating pain down left leg during injection. Imaging Guidance (Non-Spinal):          Type of Imaging Technique: Fluoroscopy Guidance (Non-Spinal) Indication(s): Assistance in needle guidance and placement for procedures requiring needle placement in or near specific anatomical locations not easily accessible without such assistance. Exposure Time: Please see nurses notes. Contrast: Before injecting any contrast, we confirmed that the patient did not have an allergy to iodine, shellfish, or radiological contrast. Once satisfactory needle placement was completed at the desired level, radiological contrast was injected. Contrast injected under live fluoroscopy. No contrast complications. See chart for type and volume of contrast used. Fluoroscopic Guidance: I was personally present during the use of fluoroscopy. "Tunnel Vision Technique" used to obtain the best possible view of the target area. Parallax error corrected before commencing the procedure.  "Direction-depth-direction" technique used to introduce the needle under continuous pulsed fluoroscopy. Once target was reached, antero-posterior, oblique, and lateral fluoroscopic projection used confirm needle placement in all planes. Images permanently stored in EMR. Interpretation: I personally interpreted the imaging intraoperatively. Adequate needle placement confirmed in multiple planes. Appropriate spread of contrast into desired area was observed. No evidence of afferent or efferent intravascular uptake. Permanent images saved into the patient's record.  Post-operative Assessment:  Post-procedure Vital Signs:  Pulse/HCG Rate: (!) 59  Temp: (!) 97.5 F (36.4 C) Resp: 16 BP: 136/81 SpO2: 96 %  EBL: None  Complications: No immediate post-treatment complications observed by team, or reported by patient.  Note: The patient tolerated the entire procedure well. A repeat set of vitals were taken after the procedure and the patient was kept under observation following institutional policy, for this type of procedure. Post-procedural neurological assessment was performed, showing return to baseline, prior to discharge. The patient was provided with post-procedure discharge instructions, including a section on how to identify potential problems. Should any problems arise concerning this procedure, the patient was given instructions to immediately contact us, at any time, without hesitation. In any case, we plan to contact the patient by telephone for a follow-up status report regarding this interventional procedure.  Comments:  No additional relevant information.  Plan of Care  Orders:  Orders Placed This Encounter  Procedures   TRIGGER POINT INJECTION    Area: Buttocks region (gluteal area) Indications: Piriformis muscle pain;  LEFT piriformis-syndrome; piriformis muscle spasms (Z61.096(M62.838). CPT code: 0454020552    Standing Status:   Standing    Number of Occurrences:   6    Standing Expiration  Date:   08/20/2022    Scheduling Instructions:     Type: Myoneural block (TPI) of piriformis muscle.     Side:  LEFT     Sedation: Patient's choice.     Timeframe: Today    Order  Specific Question:   Where will this procedure be performed?    Answer:   [facility]   DG PA[facility] C-ARM 1-60 MIN NO REPORT    Intraoperative interpretation by procedural physician at [facility].    Standing Status:   Standing    Number of Occurrences:   1    Order Specific Question:   Reason for exam:    Answer:   Assistance in needle guidance and placement for procedures requiring needle placement in or near specific anatomical locations not easily accessible without such assistance.      Medications ordered for procedure: Meds ordered this encounter  Medications   iohexol (OMNIPAQUE) 180 MG/ML injection 10 mL    Must be Myelogram-compatible. If not available, you may substitute with a water-soluble, non-ionic, hypoallergenic, myelogram-compatible radiological contrast medium.   lidocaine (XYLOCAINE) 2 % (with pres) injection 400 mg   ropivacaine (PF) 2 mg/mL (0.2%) (NAROPIN) injection 4 mL   dexamethasone (DECADRON) injection 10 mg      Medications administered: We administered iohexol, lidocaine, ropivacaine (PF) 2 mg/mL (0.2%), and dexamethasone.  See the medical record for exact dosing, route, and time of administration.  Follow-up plan:   Return for PRN repeat piriformis TPI.      LEFT L4/5 ESI 11/24/20: Not effective.  Left piriformis TPI #1 02/02/2021 (100% pain relief for 2 days),  #2 03/14/21, #3 04/18/21, #4 07/18/21, #5 09/05/21, #6 10/17/21, #7 11/07/21, #8 12/12/21, #9 01/18/22    Recent Visits Date Type Provider Dept  01/18/22 Procedure visit Edward Jolly, MD [facility]  12/12/21 Procedure visit Edward Jolly, MD [facility]  Showing recent visits within past 90 days and meeting all other requirements Today's Visits Date Type Provider Dept   02/20/22 Procedure visit Edward Jolly, MD [facility]  Showing today's visits and meeting all other requirements Future Appointments Date Type Provider Dept  03/20/22 Appointment Edward Jolly, MD [facility]  Showing future appointments within next 90 days and meeting all other requirements  Disposition: Discharge home  Discharge (Date   Time): 02/20/2022; 0840 hrs.   Primary Care Physician: Daisy Floro, MD Location: [facility] Note by: Edward Jolly, MD Date: 02/20/2022; Time: 9:28 AM  Disclaimer:  Medicine is not an exact science. The only guarantee in medicine is that nothing is guaranteed. It is important to note that the decision to proceed with this intervention was based on the information collected from the patient. The Data and conclusions were drawn from the patient's questionnaire, the interview, and the physical examination. Because the information was provided in large part by the patient, it cannot be guaranteed that it has not been purposely or unconsciously manipulated. Every effort has been made to obtain as much relevant data as possible for this evaluation. It is important to note that the conclusions that lead to this procedure are derived in large part from the available data. Always take into account that the treatment will also be dependent on availability of resources and existing treatment guidelines, considered by other Pain Management Practitioners as being common knowledge and practice, at the time of the intervention. For Medico-Legal purposes, it is also important to point out that variation in procedural techniques and pharmacological choices are the acceptable norm. The indications, contraindications, technique, and results of the above procedure should only be interpreted and judged by a Board-Certified Interventional Pain Specialist with extensive familiarity and expertise in the same exact procedure and  technique.

## 2022-02-20 NOTE — Progress Notes (Signed)
Safety precautions to be maintained throughout the outpatient stay will include: orient to surroundings, keep bed in low position, maintain call bell within reach at all times, provide assistance with transfer out of bed and ambulation.  

## 2022-02-20 NOTE — Patient Instructions (Signed)

## 2022-02-21 ENCOUNTER — Ambulatory Visit: Payer: Medicare PPO | Admitting: Cardiology

## 2022-02-21 ENCOUNTER — Encounter: Payer: Self-pay | Admitting: Cardiology

## 2022-02-21 ENCOUNTER — Telehealth: Payer: Self-pay | Admitting: *Deleted

## 2022-02-21 VITALS — BP 122/74 | HR 79 | Ht 75.0 in | Wt 199.2 lb

## 2022-02-21 DIAGNOSIS — Z789 Other specified health status: Secondary | ICD-10-CM

## 2022-02-21 DIAGNOSIS — I340 Nonrheumatic mitral (valve) insufficiency: Secondary | ICD-10-CM

## 2022-02-21 DIAGNOSIS — E785 Hyperlipidemia, unspecified: Secondary | ICD-10-CM

## 2022-02-21 DIAGNOSIS — I7121 Aneurysm of the ascending aorta, without rupture: Secondary | ICD-10-CM | POA: Diagnosis not present

## 2022-02-21 DIAGNOSIS — I2510 Atherosclerotic heart disease of native coronary artery without angina pectoris: Secondary | ICD-10-CM

## 2022-02-21 DIAGNOSIS — M79606 Pain in leg, unspecified: Secondary | ICD-10-CM

## 2022-02-21 DIAGNOSIS — R2991 Unspecified symptoms and signs involving the musculoskeletal system: Secondary | ICD-10-CM

## 2022-02-21 DIAGNOSIS — M549 Dorsalgia, unspecified: Secondary | ICD-10-CM

## 2022-02-21 NOTE — Patient Instructions (Addendum)
Medication Instructions:   Your physician recommends that you continue on your current medications as directed. Please refer to the Current Medication list given to you today.  *If you need a refill on your cardiac medications before your next appointment, please call your pharmacy*   You have been referred to [facility] TO SEE Antoine Primas   Testing/Procedures:  Your physician has requested that you have an echocardiogram. Echocardiography is a painless test that uses sound waves to create images of your heart. It provides your doctor with information about the size and shape of your heart and how well your hearts chambers and valves are working. This procedure takes approximately one hour. There are no restrictions for this procedure. PLEASE SCHEDULE ECHO TO BE DONE IN JAN 2024 PER DR. Shari Prows   Follow-Up: At [facility], you and your health needs are our priority.  As part of our continuing mission to provide you with exceptional heart care, we have created designated Provider Care Teams.  These Care Teams include your primary Cardiologist (physician) and Advanced Practice Providers (APPs -  Physician Assistants and Nurse Practitioners) who all work together to provide you with the care you need, when you need it.  We recommend signing up for the patient portal called "MyChart".  Sign up information is provided on this After Visit Summary.  MyChart is used to connect with patients for Virtual Visits (Telemedicine).  Patients are able to view lab/test results, encounter notes, upcoming appointments, etc.  Non-urgent messages can be sent to your provider as well.   To learn more about what you can do with MyChart, go to [email or web address].    Your next appointment:   1 year(s)  The format for your next appointment:   In Person  Provider:   Meriam Sprague, MD {

## 2022-02-21 NOTE — Telephone Encounter (Signed)
No problems post procedure. 

## 2022-02-21 NOTE — Progress Notes (Signed)
Cardiology Office Note:    Date:  02/21/2022   ID:  Patrick Moyer, DOB Dec 01, 1951, MRN [id number]  PCP:  Lawerance Cruel, MD   [place] Providers Cardiologist:  Freada Bergeron, MD {   Referring MD: Lawerance Cruel, MD    History of Present Illness:    Patrick Moyer is a 71 y.o. male with a hx of ascending aortic aneurysm, nonobstructive coronary artery disease, marfanoid habitus, hyperlipidemia who was previously followed by Dr. Meda Coffee who now returns to clinic for follow-up.  Per review of the record, the patient has a very strong family history of Marfan syndrome. His brother was diagnosed with Marfan syndrome, father died of ascending aortic dissection and coronary dissection, daughter is unusually tall with long arm span than body legs when she was growing up.   Previous coronary CTA 10/14/2019 with coronary calcium score 57 placing him in the 41st percentile for age and sex matched control.  He had mildly dilated aortic root at the sinus level with maximum diameter 43 mm.  Last saw Patrick Moyer in 08/2021 where he was doing very well without any anginal symptoms. Remained very active.  Today, the patient states that he is doing well overall. He remains very active. For exercise he usually swims 1 mile daily. Additionally he walks while wearing a parachute. Has been suffering from some joint pain with his arthritis and collapsing arches. Avoids lifting heavy weights. Last CTA with ascending aorta 85mm. No anginal or HF symptoms.    He denies any palpitations, chest pain, shortness of breath, or peripheral edema. No lightheadedness, headaches, syncope, orthopnea, or PND.   Past Medical History:  Diagnosis Date   ALLERGIC RHINITIS    Arthritis pain of wrist    Bell's palsy    per pt 1987 left side, 20% residual effect's eyelid   Benign localized prostatic hyperplasia with lower urinary tract symptoms (LUTS)    Chronic bilateral low back pain with bilateral  sciatica    Diverticulosis    ED (erectile dysfunction)    Environmental and seasonal allergies    Family history of Marfan syndrome    father, paternal uncle, and brother   GERD (gastroesophageal reflux disease)    Hiatal hernia    High cholesterol    History of closed head injury    04-28-2020 per pt yrs ago w/ no loc, residual mild memory loss which has resolved   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 71   per pt treated with INH for one year and cxr's since have been normal   HSV-1 infection    Marfanoid habitus    per pt has wing span is longer than his height, effects eyes (iris), hyperflexibility of ankle and hands,  AAA   Mild CAD    cardiologist--  dr Meda Coffee---  CTA 10-22-2019   Non-celiac gluten sensitivity    Numbness and tingling in both hands    per pt intermittantly due to cervical spine   OSA on CPAP    auto 8-10,  pt stated uses every night   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)   Polyneuropathy    left leg, lumbar nerve    Pulmonary nodules    per pt followed by pcp (CTA and CT chest 10-22-2019 , stable)   Radiculopathy of leg    Thoracic ascending aortic aneurysm    cardiologist--- dr Liane Comber--- CTA 10-22-2019  ascending aorta 14mm and  aortic root 75mm   Wears glasses     Past Surgical History:  Procedure Laterality Date   FINGER EXPLORATION Left 1995   left middle finger digital nerve repair   INGUINAL HERNIA REPAIR Left 1978   LAPAROSCOPIC APPENDECTOMY  2010   NASAL SEPTUM SURGERY  1975   TRANSURETHRAL RESECTION OF PROSTATE N/A 05/05/2020   Procedure: TRANSURETHRAL RESECTION OF THE PROSTATE (TURP), BIPOLAR;  Surgeon: Ceasar Mons, MD;  Location: [facility];  Service: Urology;  Laterality: N/A;   WISDOM TOOTH EXTRACTION  1975    Current Medications: Current Meds  Medication Sig   acetaminophen (TYLENOL) 500 MG tablet Take 500 mg by mouth at bedtime.   acyclovir ointment  (ZOVIRAX) 5 % Apply 1 application topically daily as needed.   Artificial Saliva (BIOTENE DRY MOUTH) LOZG Use as directed in the mouth or throat.   B Complex Vitamins (B COMPLEX 100 PO) Take 1 tablet by mouth in the morning and at bedtime.   Ca Carbonate-Mag Hydroxide (ROLAIDS PO) Take by mouth as needed.   celecoxib (CELEBREX) 200 MG capsule Take 200 mg by mouth 2 (two) times daily.   cholecalciferol (VITAMIN D) 1000 UNITS tablet Take 1,000 Units by mouth in the morning and at bedtime.   fexofenadine (ALLEGRA) 180 MG tablet Take 1 tablet by mouth daily.   FIBER PO Take by mouth daily.   fluticasone (FLONASE) 50 MCG/ACT nasal spray Place into both nostrils 3 (three) times daily.   gabapentin (NEURONTIN) 300 MG capsule Take 3 capsules (900 mg total) by mouth 3 (three) times daily.   glucosamine-chondroitin 500-400 MG tablet Take 1 tablet by mouth daily.   guaiFENesin (MUCINEX) 600 MG 12 hr tablet Take 1,200 mg by mouth 2 (two) times daily.   L-Lysine 500 MG CAPS Take 1 capsule by mouth in the morning, at noon, and at bedtime.    montelukast (SINGULAIR) 10 MG tablet Take 10 mg by mouth at bedtime.    nystatin (MYCOSTATIN) 100000 UNIT/ML suspension Take 5 mLs by mouth as needed.    omeprazole (PRILOSEC) 20 MG capsule Take 20 mg by mouth daily.   rosuvastatin (CRESTOR) 5 MG tablet Take 1 tablet by mouth 2-3 times per week   SODIUM FLUORIDE 5000 PPM 1.1 % GEL dental gel Take 1 application by mouth daily.   VALACYCLOVIR HCL PO Take by mouth.     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    Employer: [employer]  Tobacco Use   Smoking status: Never   Smokeless tobacco: Never  Vaping Use    Vaping Use: Never used  Substance and Sexual Activity   Alcohol use: No   Drug use: Never   Sexual activity: Not on file  Other Topics Concern   Not on file  Social History Narrative   Not on file   Social Determinants of Health   Financial Resource Strain: Not on file  Food Insecurity: Not on file  Transportation Needs: Not on file  Physical Activity: Not on file  Stress: Not on file  Social Connections: Not on file     Family History: The patient's family history includes Aortic aneurysm  in his father; Bipolar disorder in his brother; CAD in his father; Glaucoma in his brother; Hypertension in his father; Marfan syndrome in his brother; Obesity in his brother; Osteoarthritis in his sister; Ovarian cancer in his mother.  ROS:   Review of Systems  Constitutional:  Negative for chills and fever.  HENT:  Negative for nosebleeds and sore throat.   Eyes:  Negative for blurred vision and pain.  Respiratory:  Negative for cough and sputum production.   Cardiovascular:  Negative for chest pain, palpitations, orthopnea, claudication, leg swelling and PND.  Gastrointestinal:  Negative for blood in stool and constipation.  Genitourinary:  Negative for dysuria and hematuria.  Musculoskeletal:  Positive for joint pain. Negative for falls.  Neurological:  Negative for loss of consciousness and headaches.  Endo/Heme/Allergies:  Negative for polydipsia.  Psychiatric/Behavioral:  Negative for hallucinations. The patient does not have insomnia.     EKGs/Labs/Other Studies Reviewed:    The following studies were reviewed today:  CTA Chest/Aorta 01/09/2022: FINDINGS: Vascular Findings:   Stable mild fusiform aneurysmal dilatation of the ascending thoracic aorta with measurements as follows. The thoracic aorta tapers to a normal caliber at the level of the aortic arch. The descending thoracic aorta is of normal caliber and widely patent without a hemodynamically significant stenosis. No  evidence of thoracic aortic dissection or periaortic stranding on this nongated examination.   Conventional configuration of the aortic arch. The branch vessels of the aortic arch appear patent throughout their imaged courses.   Normal heart size. Scattered coronary artery calcifications. No pericardial effusion though small amount of fluid is seen within the pericardial recess.   Although this examination was not tailored for the evaluation the pulmonary arteries, there are no discrete filling defects within the central pulmonary arterial tree to suggest central pulmonary embolism. Normal caliber of the main pulmonary artery.   -------------------------------------------------------------   Thoracic aortic measurements:   SINOTUBULAR JUNCTION: 38 mm as measured in greatest oblique short axis coronal dimension.   PROXIMAL ASCENDING THORACIC AORTA: 42 mm as measured in greatest oblique short axis axial dimension at the level of the main pulmonary artery (image 59, series 4) and approximately 40 mm in greatest oblique short axis coronal diameter (coronal image 45, series 7), unchanged compared to the 04/2015 examination.   AORTIC ARCH: 33 mm as measured in greatest oblique short axis sagittal dimension.   PROXIMAL DESCENDING THORACIC AORTA: 30 mm as measured in greatest oblique short axis axial dimension at the level of the main pulmonary artery.   DISTAL DESCENDING THORACIC AORTA: 28 mm as measured in greatest oblique short axis axial dimension at the level of the diaphragmatic hiatus.   Review of the MIP images confirms the above findings.   -------------------------------------------------------------   Non-Vascular Findings:   Mediastinum/Lymph Nodes: Redemonstrated calcified hilar and mediastinal lymph nodes, the sequela of previous granulomatous infection. No bulky mediastinal, hilar axillary lymphadenopathy.   Lungs/Pleura: Redemonstrated biapical  pleuroparenchymal thickening, extending along the posterosuperior aspect of the right major fissure, similar to remote examination performed in 2016. No discrete focal airspace opacities. No pleural effusion or pneumothorax.   Scattered punctate pulmonary nodules are unchanged since the 04/2015 examination with index 5 mm right middle lobe pulmonary nodule seen on image 62, series 5 and additional pulmonary nodules seen on images 69 and 84, series 5. Imaging stability for greater than 2 years is indicative of a benign etiology. No worrisome new or enlarging pulmonary nodules.   Upper abdomen: Limited early arterial phase evaluation  of the upper abdomen demonstrates diffuse decreased attenuation of the hepatic parenchyma suggestive of hepatic steatosis. Normal appearance of the gallbladder given degree distention. No radiopaque gallstones.   Musculoskeletal: No acute or aggressive osseous abnormalities. 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 Regional soft tissues appear normal. The thyroid gland appears slightly atrophic but without discrete nodule or mass.   IMPRESSION: 1. Stable uncomplicated mild fusiform aneurysmal dilatation of the ascending thoracic aorta measuring 42 mm in diameter, unchanged compared to the 04/2015 examination. Aortic aneurysm NOS (ICD10-I71.9). 2. Coronary artery calcifications. Aortic Atherosclerosis (ICD10-I70.0).  Coronary CTA: 10/22/2019   1. Coronary calcium score of 57. This was 70 percentile for age and sex matched control.   2. Normal coronary origin with right dominance.   3. CAD-RADS 1. Minimal non-obstructive CAD (0-24%) in the proximal LAD. Consider non-atherosclerotic causes of chest pain. Consider preventive therapy and risk factor modification.   4. Mildly dilated aortic root at the sinus level with maximum diameter 43 mm, upper normal size of the ascending aorta measuring 40 mm. When compared to the prior  study from 05/07/2018 there has been no change.  Echo 09/10/2019:  1. The left ventricle has normal systolic function with an ejection  fraction of 60-65%. The cavity size was normal. There is mild asymmetric  left ventricular hypertrophy. Left ventricular diastolic Doppler  parameters are consistent with impaired  relaxation. No evidence of left ventricular regional wall motion  abnormalities.   2. Mild Doppler evident acceleration of flow around distal outflow tract  septal hypertrophy (may result in murmur).   3. The right ventricle has normal systolic function. The cavity was  normal. There is No increase in right ventricular wall thickness.   4. Left atrial size was normal.   5. Right atrial size was normal.   6. No evidence of mitral valve stenosis.   7. The aortic valve is tricuspid. Mild thickening of the aortic valve.  Mild calcification of the aortic valve. Aortic valve regurgitation was not  visualized by color flow Doppler.   8. The pulmonic valve was grossly normal. Pulmonic valve regurgitation is not visualized by color flow Doppler.   9. The aorta is abnormal unless otherwise noted.  10. There is dilatation of the ascending aorta measuring 40 mm.  11. The average left ventricular global longitudinal strain is -22.7 %.   EKG:   02/21/2022: No new ECG today 09/16/2021 Laurann Montana, NP): Sinus bradycardia, 57 bpm with no acute ST/T wave changes.  Recent Labs: No results found for requested labs within last 8760 hours.   Recent Lipid Panel    Component Value Date/Time   CHOL 174 12/27/2020 1216   TRIG 69 12/27/2020 1216   HDL 49 12/27/2020 1216   CHOLHDL 3.6 12/27/2020 1216   LDLCALC 112 (H) 12/27/2020 1216     Risk Assessment/Calculations:           Physical Exam:    VS:  BP 122/74    Pulse 79    Ht 6\' 3"  (1.905 m)    Wt 199 lb 3.2 oz (90.4 kg)    SpO2 96%    BMI 24.90 kg/m     Wt Readings from Last 3 Encounters:  02/21/22 199 lb 3.2 oz (90.4 kg)   02/20/22 199 lb (90.3 kg)  01/18/22 201 lb (91.2 kg)     GEN: Well nourished, well developed in no acute distress HEENT: Normal NECK: No JVD; No carotid bruits CARDIAC: RRR, no murmurs,  rubs, gallops RESPIRATORY:  Clear to auscultation without rales, wheezing or rhonchi  ABDOMEN: Soft, non-tender, non-distended MUSCULOSKELETAL:  No edema; No deformity  SKIN: Warm and dry NEUROLOGIC:  Alert and oriented x 3 PSYCHIATRIC:  Normal affect   ASSESSMENT:    1. Aneurysm of ascending aorta without rupture   2. Back pain, unspecified back location, unspecified back pain laterality, unspecified chronicity   3. Pain of lower extremity, unspecified laterality   4. Mild mitral regurgitation   5. Coronary artery disease involving native coronary artery of native heart without angina pectoris   6. Nonobstructive atherosclerosis of coronary artery   7. Hyperlipidemia LDL goal <70   8. Statin intolerance   9. Marfanoid habitus    PLAN:    In order of problems listed above:  #Nonobstructive CAD: Cardiac CTA 10/22/19 with coronary calcium score of 57 and minimal nonobstructive disease <25% in LAD. Stable with no anginal symptoms. Remains very active.  -Continue crestor 5mg  three times per week -Goal LDL<70  #Ascending Aortic Aneurysm: #Strong Family History of Marfan Syndrome: Chest CTA 04/27/2018 ascending aortic dimension 41 mm and aortic root dimension at the sinuses level 43 mm. Chest CTA 09/2019 unchanged diameter at sense of a 43 mm well maximum diameter ascending aorta was 40 mm. Repeat CT 12/2021 with ascending aorta 31mm. -Continue annual monitoring -Avoid heavy lifting  -Will consider genetic testing in future pending insurance  #HLD: -Continue crestor 5mg  three times per week -Continue lifestyle modifications -Goal LDL<70  #Joint Pain: #Collapsing Arches: #Possible Piriformis Syndrome: -Refer to Dr. Tamala Julian  #Mild MR: Noted on TTE 2020. -Will repeat TTE in 2024 for  monitoring        Follow-up:  1 year.  Medication Adjustments/Labs and Tests Ordered: Current medicines are reviewed at length with the patient today.  Concerns regarding medicines are outlined above.   Orders Placed This Encounter  Procedures   AMB referral to sports medicine   ECHOCARDIOGRAM COMPLETE   No orders of the defined types were placed in this encounter.  Patient Instructions  Medication Instructions:   Your physician recommends that you continue on your current medications as directed. Please refer to the Current Medication list given to you today.  *If you need a refill on your cardiac medications before your next appointment, please call your pharmacy*   You have been referred to [place]   Testing/Procedures:  Your physician has requested that you have an echocardiogram. Echocardiography is a painless test that uses sound waves to create images of your heart. It provides your doctor with information about the size and shape of your heart and how well your hearts chambers and valves are working. This procedure takes approximately one hour. There are no restrictions for this procedure. PLEASE SCHEDULE ECHO TO BE DONE IN JAN 2024 PER DR. Johney Frame   Follow-Up: At [facility], you and your health needs are our priority.  As part of our continuing mission to provide you with exceptional heart care, we have created designated Provider Care Teams.  These Care Teams include your primary Cardiologist (physician) and Advanced Practice Providers (APPs -  Physician Assistants and Nurse Practitioners) who all work together to provide you with the care you need, when you need it.  We recommend signing up for the patient portal called "MyChart".  Sign up information is provided on this After Visit Summary.  MyChart is used to connect with patients for Virtual Visits (Telemedicine).  Patients are able to view lab/test  results, encounter notes,  upcoming appointments, etc.  Non-urgent messages can be sent to your provider as well.   To learn more about what you can do with MyChart, go to [email or web address].    Your next appointment:   1 year(s)  The format for your next appointment:   In Person  Provider:   Freada Bergeron, MD {     [facility] Stumpf,acting as a scribe for Freada Bergeron, MD.,have documented all relevant documentation on the behalf of Freada Bergeron, MD,as directed by  Freada Bergeron, MD while in the presence of Freada Bergeron, MD.  I, Freada Bergeron, MD, have reviewed all documentation for this visit. The documentation on 02/21/22 for the exam, diagnosis, procedures, and orders are all accurate and complete.   Signed, Freada Bergeron, MD  02/21/2022 9:16 AM    [place]

## 2022-03-20 ENCOUNTER — Ambulatory Visit
Admission: RE | Admit: 2022-03-20 | Discharge: 2022-03-20 | Disposition: A | Discharge status: Home / Self Care | Payer: Medicare PPO | Source: Ambulatory Visit | Attending: Student in an Organized Health Care Education/Training Program | Admitting: Student in an Organized Health Care Education/Training Program

## 2022-03-20 ENCOUNTER — Ambulatory Visit (HOSPITAL_BASED_OUTPATIENT_CLINIC_OR_DEPARTMENT_OTHER): Payer: Medicare PPO | Admitting: Student in an Organized Health Care Education/Training Program

## 2022-03-20 ENCOUNTER — Other Ambulatory Visit: Payer: Self-pay

## 2022-03-20 ENCOUNTER — Encounter: Payer: Self-pay | Admitting: Student in an Organized Health Care Education/Training Program

## 2022-03-20 VITALS — BP 135/81 | HR 64 | Temp 97.1°F | Resp 18 | Ht 75.0 in | Wt 201.5 lb

## 2022-03-20 DIAGNOSIS — M549 Dorsalgia, unspecified: Secondary | ICD-10-CM | POA: Diagnosis not present

## 2022-03-20 DIAGNOSIS — G5702 Lesion of sciatic nerve, left lower limb: Secondary | ICD-10-CM

## 2022-03-20 MED ORDER — ROPIVACAINE HCL 2 MG/ML IJ SOLN
4.0000 mL | Freq: Once | INTRAMUSCULAR | Status: AC
  Administered 2022-03-20: 4 mL via INTRA_ARTICULAR
  Filled 2022-03-20: qty 20

## 2022-03-20 MED ORDER — DEXAMETHASONE SODIUM PHOSPHATE 10 MG/ML IJ SOLN
10.0000 mg | Freq: Once | INTRAMUSCULAR | Status: AC
  Administered 2022-03-20: 10 mg
  Filled 2022-03-20: qty 1

## 2022-03-20 MED ORDER — LIDOCAINE HCL 2 % IJ SOLN
20.0000 mL | Freq: Once | INTRAMUSCULAR | Status: AC
  Administered 2022-03-20: 400 mg
  Filled 2022-03-20: qty 20

## 2022-03-20 MED ORDER — IOHEXOL 180 MG/ML  SOLN
10.0000 mL | Freq: Once | INTRAMUSCULAR | Status: AC
  Administered 2022-03-20: 10 mL via INTRA_ARTICULAR
  Filled 2022-03-20: qty 20

## 2022-03-20 NOTE — Progress Notes (Signed)
?Terrilee Files D.O. ?[facility] ?[address] [place] [address] ?Phone: [phone] ?Subjective:   ?I, Patrick Moyer, am serving as a Neurosurgeon for Dr. Antoine Primas. ?This visit occurred during the SARS-CoV-2 public health emergency.  Safety protocols were in place, including screening questions prior to the visit, additional usage of staff PPE, and extensive cleaning of exam room while observing appropriate contact time as indicated for disinfecting solutions.  ? ?I'm seeing this patient by the request  of:   Pemberton MD ? ?CC: low back pain  ? ?NID:[id number]  ?Patrick Moyer is a 71 y.o. male coming in with complaint of LBP and leg pain. Patient states been in pain for 8 years. Used to walk and hike 2-4 miles a day. At this point only 1/4 of a miles. Left leg pain worse than back pain. Piriformis injections helps takes the edge off. Swimming helps as well. Walking starts burning in lower leg. Sometimes gets numbness in hip. Can happen from just standing. ? ?Reviewing patient's chart patient has been seen by multiple different providers over the course of time including physical therapy and pain management.  Patient has had multiple injections on the left side of the piriformis as well as trigger point injections and most recently a sacroiliac injection yesterday.  For over 1 year at this time.  Patient has been on multiple different medications including gabapentin and Celebrex at this time. ? ?  ?Past medical history is significant for an aneurysm of the ascending aorta followed by Dr. Shari Prows and is scheduled for another echo to be done in January 2024. ?Family history and does have a brother diagnosed with Marfan syndrome.  Also father did die of ascending aortic dissection. ? ?Past Medical History:  ?Diagnosis Date  ? ALLERGIC RHINITIS   ? Arthritis pain of wrist   ? Bell's palsy   ? per pt 1987 left side, 20% residual effect's eyelid  ? Benign localized prostatic hyperplasia with lower  urinary tract symptoms (LUTS)   ? Chronic bilateral low back pain with bilateral sciatica   ? Diverticulosis   ? ED (erectile dysfunction)   ? Environmental and seasonal allergies   ? Family history of Marfan syndrome   ? father, paternal uncle, and brother  ? GERD (gastroesophageal reflux disease)   ? Hiatal hernia   ? High cholesterol   ? History of closed head injury   ? 04-28-2020 per pt yrs ago w/ no loc, residual mild memory loss which has resolved  ? 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 64  ? per pt treated with INH for one year and cxr's since have been normal  ? HSV-1 infection   ? Marfanoid habitus   ? per pt has wing span is longer than his height, effects eyes (iris), hyperflexibility of ankle and hands,  AAA  ? Mild CAD   ? cardiologist--  dr Delton See---  CTA 10-22-2019  ? Non-celiac gluten sensitivity   ? Numbness and tingling in both hands   ? per pt intermittantly due to cervical spine  ? OSA on CPAP   ? auto 8-10,  pt stated uses every night  ? Pigment dispersion syndrome   ? 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)  ? Polyneuropathy   ? left leg, lumbar nerve   ? Pulmonary nodules   ? per pt followed by pcp (CTA and CT chest 10-22-2019 , stable)  ? Radiculopathy of leg   ? Thoracic ascending aortic aneurysm   ?  cardiologist--- dr Eloy End--- CTA 10-22-2019  ascending aorta 27mm and aortic root 31mm  ? Wears glasses   ? ?Past Surgical History:  ?Procedure Laterality Date  ? FINGER EXPLORATION Left 1995  ? left middle finger digital nerve repair  ? INGUINAL HERNIA REPAIR Left 1978  ? LAPAROSCOPIC APPENDECTOMY  2010  ? NASAL SEPTUM SURGERY  1975  ? TRANSURETHRAL RESECTION OF PROSTATE N/A 05/05/2020  ? Procedure: TRANSURETHRAL RESECTION OF THE PROSTATE (TURP), BIPOLAR;  Surgeon: Rene Paci, MD;  Location: [facility];  Service: Urology;  Laterality: N/A;  ? WISDOM TOOTH EXTRACTION  1975  ? ?Social History  ? ?Socioeconomic History  ? Marital status: Married  ?  Spouse  name: Not on file  ? Number of children: 2  ? Years of education: Not on file  ? Highest education level: Not on file  ?Occupational History  ? 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  Employer: [employer]  ?Tobacco Use  ? Smoking status: Never  ? Smokeless tobacco: Never  ?Vaping Use  ? Vaping Use: Never used  ?Substance and Sexual Activity  ? Alcohol use: No  ? Drug use: Never  ? Sexual activity: Not on file  ?Other Topics Concern  ? Not on file  ?Social History Narrative  ? Not on file  ? ?Social Determinants of Health  ? ?Financial Resource Strain: Not on file  ?Food Insecurity: Not on file  ?Transportation Needs: Not on file  ?Physical Activity: Not on file  ?Stress: Not on file  ?Social Connections: Not on file  ? ?Allergies  ?Allergen Reactions  ? Other   ?  Adhesive, Caffene-abd pain  ? Cialis [Tadalafil]   ?  Caused acute angle glaucoma  ? Rosuvastatin Other (See Comments)  ?  Causes joint aches  ? Sildenafil   ?  Caused acute angle glaucoma  ? Soy Protein [Soybean Oil]   ?  Gastrointestinal upset  ? ?Family History  ?Problem Relation Age of Onset  ? Aortic aneurysm Father   ?     abdominal  ? Hypertension Father   ? CAD Father   ? Ovarian cancer Mother   ?     cysto adenocarcenoma  ? Osteoarthritis Sister   ? Glaucoma Brother   ? Bipolar disorder Brother   ? Marfan syndrome Brother   ? Obesity Brother   ? ? ? ?Current Outpatient Medications (Cardiovascular):  ?  rosuvastatin (CRESTOR) 5 MG tablet, Take 1 tablet by mouth 2-3 times per week ? ?Current Outpatient Medications (Respiratory):  ?  fexofenadine (ALLEGRA) 180 MG tablet, Take 1 tablet by mouth daily. ?  fluticasone (FLONASE) 50 MCG/ACT nasal spray, Place into both nostrils 3 (three) times daily. ?  guaiFENesin (MUCINEX) 600 MG 12 hr tablet, Take 1,200 mg by mouth 2 (two) times daily. ?  montelukast (SINGULAIR) 10 MG tablet, Take 10 mg by mouth at bedtime.  ? ?Current Outpatient Medications (Analgesics):  ?  acetaminophen (TYLENOL) 500 MG tablet, Take 500  mg by mouth at bedtime. ?  celecoxib (CELEBREX) 200 MG capsule, Take 200 mg by mouth 2 (two) times daily. ? ? ?Current Outpatient Medications (Other):  ?  acyclovir ointment (ZOVIRAX) 5 %, Apply 1 application topically daily as needed. ?  Artificial Saliva (BIOTENE DRY MOUTH) LOZG, Use as directed in the mouth or throat. ?  B Complex Vitamins (B COMPLEX 100 PO), Take 1 tablet by mouth in the morning and at bedtime. ?  Ca Carbonate-Mag Hydroxide (ROLAIDS PO), Take  by mouth as needed. ?  cholecalciferol (VITAMIN D) 1000 UNITS tablet, Take 1,000 Units by mouth in the morning and at bedtime. ?  FIBER PO, Take by mouth daily. ?  gabapentin (NEURONTIN) 300 MG capsule, Take 3 capsules (900 mg total) by mouth 3 (three) times daily. ?  glucosamine-chondroitin 500-400 MG tablet, Take 1 tablet by mouth daily. ?  L-Lysine 500 MG CAPS, Take 1 capsule by mouth in the morning, at noon, and at bedtime.  ?  nystatin (MYCOSTATIN) 100000 UNIT/ML suspension, Take 5 mLs by mouth as needed.  ?  omeprazole (PRILOSEC) 20 MG capsule, Take 20 mg by mouth daily. ?  SODIUM FLUORIDE 5000 PPM 1.1 % GEL dental gel, Take 1 application by mouth daily. ?  VALACYCLOVIR HCL PO, Take by mouth. ? ? ?Reviewed prior external information including notes and imaging from  ?primary care provider ?As well as notes that were available from care everywhere and other healthcare systems. ? ?Past medical history, social, surgical and family history all reviewed in electronic medical record.  No pertanent information unless stated regarding to the chief complaint.  ? ?Review of Systems: ? No headache, visual changes, nausea, vomiting, diarrhea, constipation, dizziness, abdominal pain, skin rash, fevers, chills, night sweats, weight loss, swollen lymph nodes,  joint swelling, chest pain, shortness of breath, mood changes. POSITIVE muscle aches, body aches ? ?Objective  ?Blood pressure 130/78, pulse 75, height  (1.905 m), weight 207 lb (93.9 kg), SpO2 97 %. ?   ?General: No apparent distress alert and oriented x3 mood and affect normal, dressed appropriately.  ?HEENT: Pupils equal, extraocular movements intact  ?Respiratory: Patient's speak in full sentences an

## 2022-03-20 NOTE — Patient Instructions (Signed)

## 2022-03-20 NOTE — Progress Notes (Signed)
Safety precautions to be maintained throughout the outpatient stay will include: orient to surroundings, keep bed in low position, maintain call bell within reach at all times, provide assistance with transfer out of bed and ambulation.  

## 2022-03-20 NOTE — Progress Notes (Signed)
Okay PROVIDER NOTE: Information contained herein reflects review and annotations entered in association with encounter. Interpretation of such information and data should be left to medically-trained personnel. Information provided to patient can be located elsewhere in the medical record under "Patient Instructions". Document created using STT-dictation technology, any transcriptional errors that may result from process are unintentional.  ?  ?Patient: Patrick Moyer  Service Category: Procedure  Provider: Edward Jolly, MD  ?DOB: 06/05/1951  DOS: 03/20/2022  Location: [facility]  ?MRN: [id number]  Setting: Ambulatory - outpatient  Referring Provider: Daisy Floro, MD  ?Type: Established Patient  Specialty: Interventional Pain Management  PCP: Daisy Floro, MD  ? ?Primary Reason for Visit: Interventional Pain Management Treatment. ?CC: Back Pain ? ? ?Procedure:          Anesthesia, Analgesia, Anxiolysis:  ?Type: Left piriformis trigger point injection #11  under fluoroscopic guidance ?Region: Inferior Lumbosacral Region ?Level: PIIS (Posterior Inferior Iliac Spine) 1 cm deep, 1 cm lateral and 1 cm inferior from posterior inferior fissure of SI joint. ?Laterality: Left-Side   ? ?Position: Prone          ? ?Indications: ?1. Piriformis syndrome of left side   ? ? ? ? ? ?Pain Score: ?Pre-procedure: 3/10 ?Post-procedure: 0-No pain/10  ? ?Pre-op H&P Assessment:  ?Patrick Moyer is a 71 y.o. (year old), male patient, seen today for interventional treatment. He  has a past surgical history that includes Nasal septum surgery (1975); Wisdom tooth extraction (1975); Finger exploration (Left, 1995); Laparoscopic appendectomy (2010); Inguinal hernia repair (Left, 1978); and Transurethral resection of prostate (N/A, 05/05/2020). Patrick Moyer has a current medication list which includes the following prescription(s): acetaminophen, acyclovir ointment, biotene dry mouth, b complex vitamins, ca carbonate-mag  hydroxide, celecoxib, cholecalciferol, fexofenadine, fiber, fluticasone, gabapentin, glucosamine-chondroitin, guaifenesin, l-lysine, montelukast, nystatin, omeprazole, rosuvastatin, sodium fluoride 5000 ppm, and valacyclovir hcl. His primarily concern today is the Back Pain ? ? ?Initial Vital Signs:  ?Pulse/HCG Rate: 64ECG Heart Rate: 63 ?Temp: (!) 97.1 ?F (36.2 ?C) ?Resp: 16 ?BP: (!) 139/94 ?SpO2: 98 % ? ?BMI: Estimated body mass index is 25.19 kg/m? as calculated from the following: ?  Height as of this encounter: 6\' 3"  (1.905 m). ?  Weight as of this encounter: 201 lb 8 oz (91.4 kg). ? ?Risk Assessment: ?Allergies: Reviewed. He is allergic to other, cialis [tadalafil], rosuvastatin, sildenafil, and soy protein [soybean oil].  ?Allergy Precautions: None required ?Coagulopathies: Reviewed. None identified.  ?Blood-thinner therapy: None at this time ?Active Infection(s): Reviewed. None identified. Patrick Moyer is afebrile ? ?Site Confirmation: Patrick Moyer was asked to confirm the procedure and laterality before marking the site ?Procedure checklist: Completed ?Consent: Before the procedure and under the influence of no sedative(s), amnesic(s), or anxiolytics, the patient was informed of the treatment options, risks and possible complications. To fulfill our ethical and legal obligations, as recommended by the American Medical Association's Code of Ethics, I have informed the patient of my clinical impression; the nature and purpose of the treatment or procedure; the risks, benefits, and possible complications of the intervention; the alternatives, including doing nothing; the risk(s) and benefit(s) of the alternative treatment(s) or procedure(s); and the risk(s) and benefit(s) of doing nothing. ?The patient was provided information about the general risks and possible complications associated with the procedure. These may include, but are not limited to: failure to achieve desired goals, infection, bleeding, organ or  nerve damage, allergic reactions, paralysis, and death. ?In addition, the patient was informed of those  risks and complications associated to the procedure, such as failure to decrease pain; infection; bleeding; organ or nerve damage with subsequent damage to sensory, motor, and/or autonomic systems, resulting in permanent pain, numbness, and/or weakness of one or several areas of the body; allergic reactions; (i.e.: anaphylactic reaction); and/or death. ?Furthermore, the patient was informed of those risks and complications associated with the medications. These include, but are not limited to: allergic reactions (i.e.: anaphylactic or anaphylactoid reaction(s)); adrenal axis suppression; blood sugar elevation that in diabetics may result in ketoacidosis or comma; water retention that in patients with history of congestive heart failure may result in shortness of breath, pulmonary edema, and decompensation with resultant heart failure; weight gain; swelling or edema; medication-induced neural toxicity; particulate matter embolism and blood vessel occlusion with resultant organ, and/or nervous system infarction; and/or aseptic necrosis of one or more joints. ?Finally, the patient was informed that Medicine is not an exact science; therefore, there is also the possibility of unforeseen or unpredictable risks and/or possible complications that may result in a catastrophic outcome. The patient indicated having understood very clearly. We have given the patient no guarantees and we have made no promises. Enough time was given to the patient to ask questions, all of which were answered to the patient's satisfaction. Patrick Moyer has indicated that he wanted to continue with the procedure. ?Attestation: I, the ordering provider, attest that I have discussed with the patient the benefits, risks, side-effects, alternatives, likelihood of achieving goals, and potential problems during recovery for the procedure that I have  provided informed consent. ?Date  Time: 03/20/2022  8:45 AM ? ?Pre-Procedure Preparation:  ?Monitoring: As per clinic protocol. Respiration, ETCO2, SpO2, BP, heart rate and rhythm monitor placed and checked for adequate function ?Safety Precautions: Patient was assessed for positional comfort and pressure points before starting the procedure. ?Time-out: I initiated and conducted the "Time-out" before starting the procedure, as per protocol. The patient was asked to participate by confirming the accuracy of the "Time Out" information. Verification of the correct person, site, and procedure were performed and confirmed by me, the nursing staff, and the patient. "Time-out" conducted as per Joint Commission's Universal Protocol (UP.01.01.01). ?Time: 0998 ? ?Description of Procedure:          ?Target Area: 1 cm deep, 1 cm lateral, 1 cm inferior to the Inferior, posterior, aspect of the sacroiliac fissure ?Approach: Posterior, paraspinal, ipsilateral approach. ?Area Prepped: Entire Lower Lumbosacral Region ?DuraPrep (Iodine Povacrylex [0.7% available iodine] and Isopropyl Alcohol, 74% w/w) ?Safety Precautions: Aspiration looking for blood return was conducted prior to all injections. At no point did we inject any substances, as a needle was being advanced. No attempts were made at seeking any paresthesias. Safe injection practices and needle disposal techniques used. Medications properly checked for expiration dates. SDV (single dose vial) medications used. ?Description of the Procedure: Protocol guidelines were followed. The patient was placed in position over the procedure table. The target area was identified and the area prepped in the usual manner. Skin & deeper tissues infiltrated with local anesthetic. Appropriate amount of time allowed to pass for local anesthetics to take effect. The procedure needle was advanced under fluoroscopic guidance into the sacroiliac joint until a firm endpoint was obtained. Proper  needle placement secured. Negative aspiration confirmed. Solution injected in intermittent fashion, asking for systemic symptoms every 0.5cc of injectate. The needles were then removed and the area cleansed, [facility]

## 2022-03-21 ENCOUNTER — Ambulatory Visit: Payer: Medicare PPO | Admitting: Family Medicine

## 2022-03-21 ENCOUNTER — Telehealth: Payer: Self-pay | Admitting: *Deleted

## 2022-03-21 ENCOUNTER — Encounter: Payer: Self-pay | Admitting: Family Medicine

## 2022-03-21 DIAGNOSIS — G894 Chronic pain syndrome: Secondary | ICD-10-CM | POA: Diagnosis not present

## 2022-03-21 DIAGNOSIS — M357 Hypermobility syndrome: Secondary | ICD-10-CM

## 2022-03-21 NOTE — Telephone Encounter (Signed)
No problems post procedure. 

## 2022-03-21 NOTE — Assessment & Plan Note (Signed)
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  Differential is quite broad including connective tissue disorders versus no benign hypermobility.  Patient does have enlargement noted of the aortic arch being followed by cardiology.  Patient does have a strong family history of brother being diagnosed as well as well as his children having significant hyperflexibility and difficulty.  Patient does have collapse skin of the feet bilaterally that I do think also contributes to some of the abnormality which does cause some more discomfort and pain in the lumbar spine as well.  We discussed lower impact exercises as well as strengthening conditioning.  We discussed though that we need to make sure that patient is not holding his breath or doing any significant heavy lifting.  We discussed with patient icing regimen and home exercises otherwise.  Increase activity slowly and we discussed over-the-counter medications.  Patient can continue to follow-up with pain management and see how patient does.  Does feel like he gets some improvement with some of the injections.  Follow-up with me again in 6 weeks. ?

## 2022-03-21 NOTE — Patient Instructions (Signed)
Isometric training for core stability ?All strength no stretch ?200mg  Vit C ?Tart cherry 1200mg  ?Continue Vit D and Tumeric ?[place] recovery sandals in house ?Coband for taping wrist when typing ? ?See you again in 6 weeks ?

## 2022-03-21 NOTE — Assessment & Plan Note (Signed)
Discussed with the chronic pain is well with patient being on a very high dose of gabapentin already we will reconsider the possibility of adding Cymbalta low-dose during the day.  Patient will consider it. ?

## 2022-04-02 IMAGING — CT CT ANGIO CHEST
3 of 8 series · 18 of 46 positions shown · IV contrast (OMNIPAQUE 350)
Comparison: 10/22/2019 and previous

CLINICAL DATA: Thoracic aortic aneurysm, follow-up.

EXAM:
CT ANGIOGRAPHY CHEST WITH CONTRAST
TECHNIQUE: Multidetector CT imaging of the chest was performed using the
standard protocol during bolus administration of intravenous
contrast. Multiplanar CT image reconstructions and MIPs were
obtained to evaluate the vascular anatomy.
CONTRAST:  100mL OMNIPAQUE IOHEXOL 350 MG/ML SOLN

[Series 4: aorta 3.0 bf37 2 · axial · 0.77mm/px · z∈[-350,-40]mm · 13 of 121 slices shown]
[im 9/121  lung]
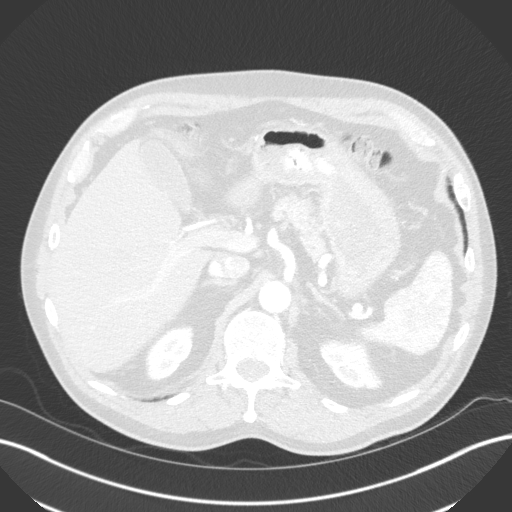
[im 18/121  soft-tissue]
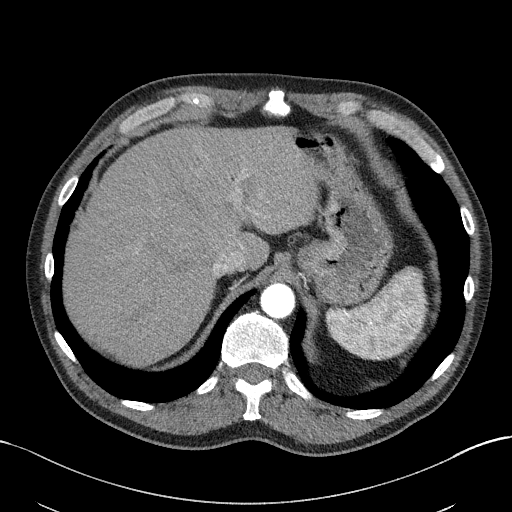
[im 26/121  lung]
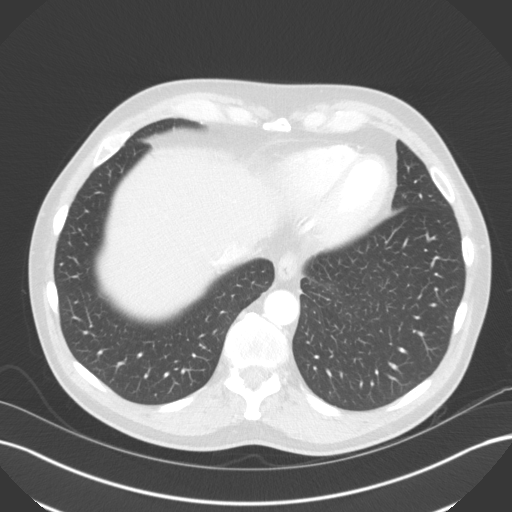
[im 35/121  soft-tissue]
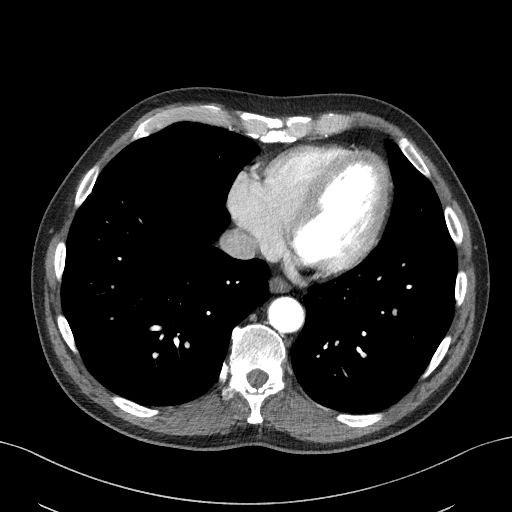
[im 43/121  lung]
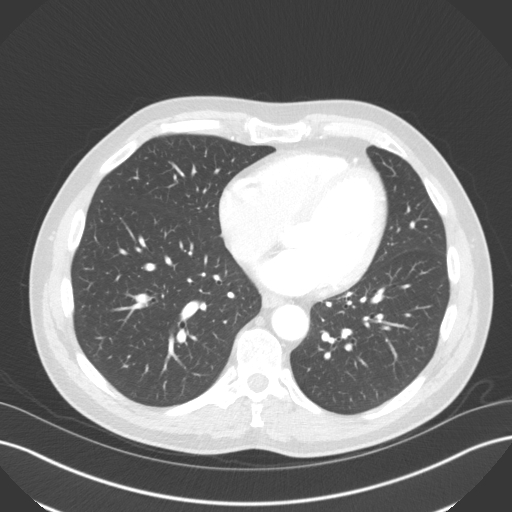
[im 52/121  soft-tissue]
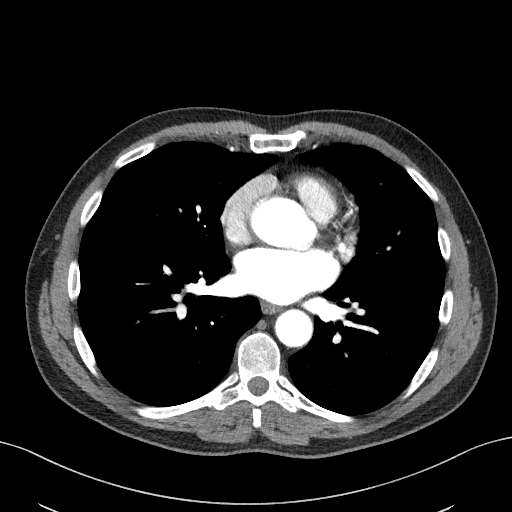
[im 61/121  lung]
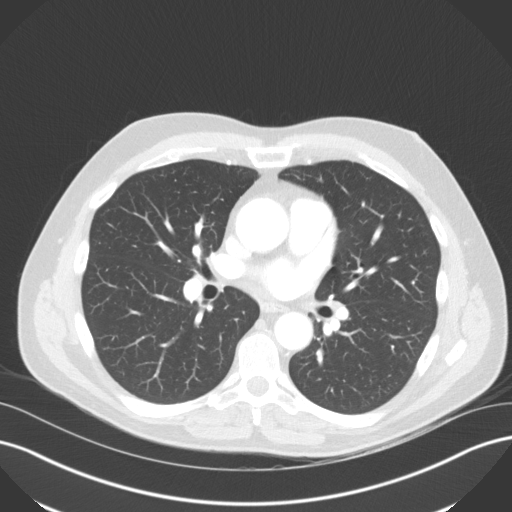
[im 69/121  soft-tissue]
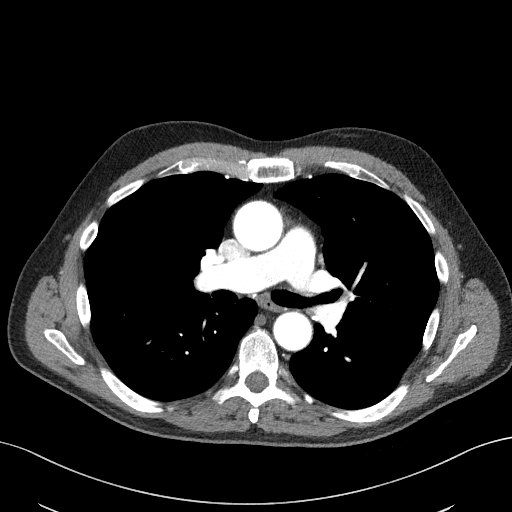
[im 78/121  lung]
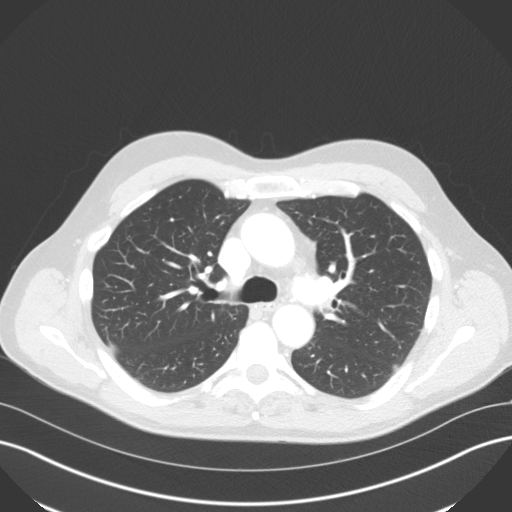
[im 86/121  soft-tissue]
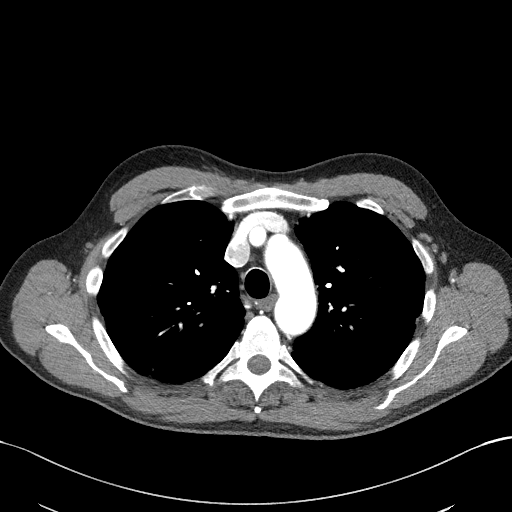
[im 95/121  lung]
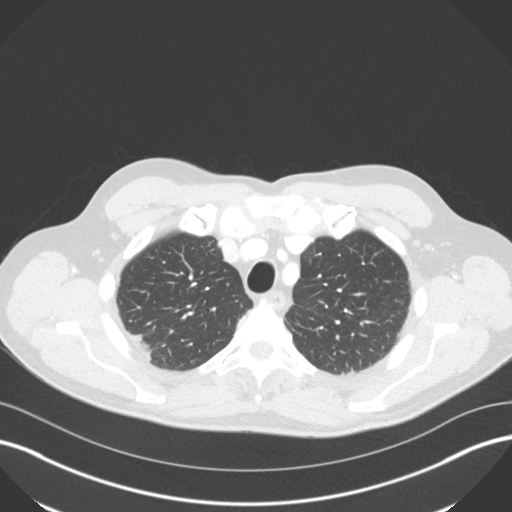
[im 103/121  soft-tissue]
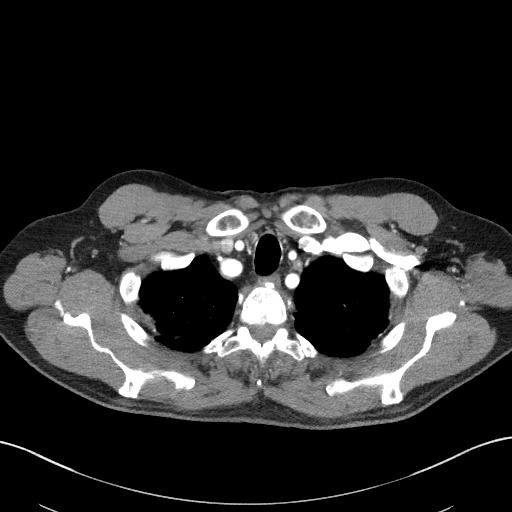
[im 112/121  lung]
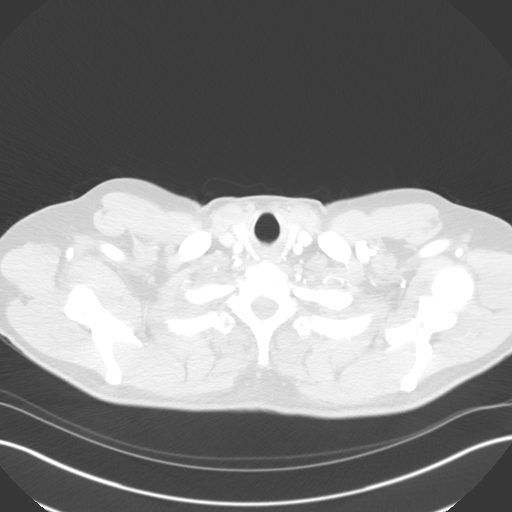

[Series 5: lung · axial · 0.77mm/px · z∈[-350,-298]mm · 2 of 121 slices shown]
[im 9/121  soft-tissue]
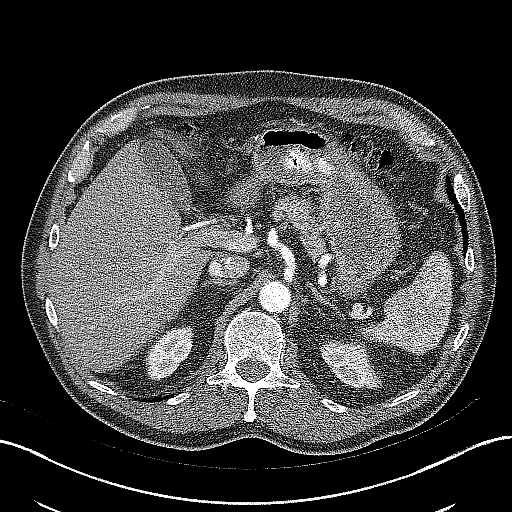
[im 26/121  soft-tissue]
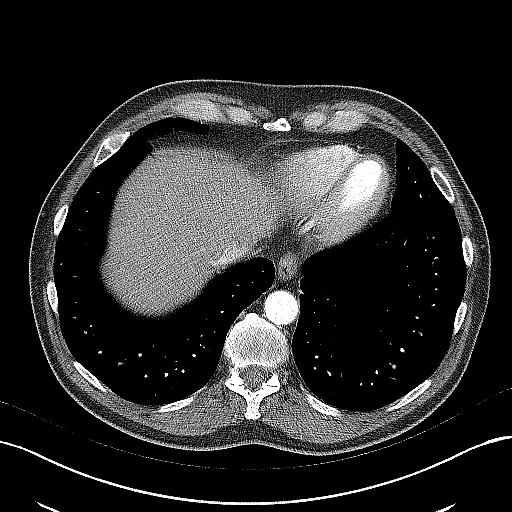

[Series 7: coronals · coronal · 0.72mm/px · 3 of 128 slices shown]
[im 32/128  soft-tissue]
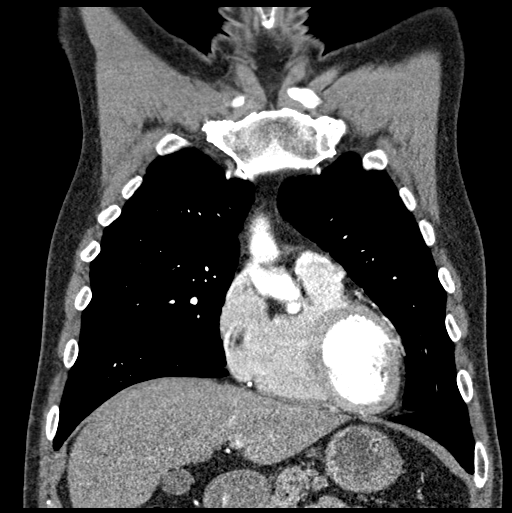
[im 64/128  soft-tissue]
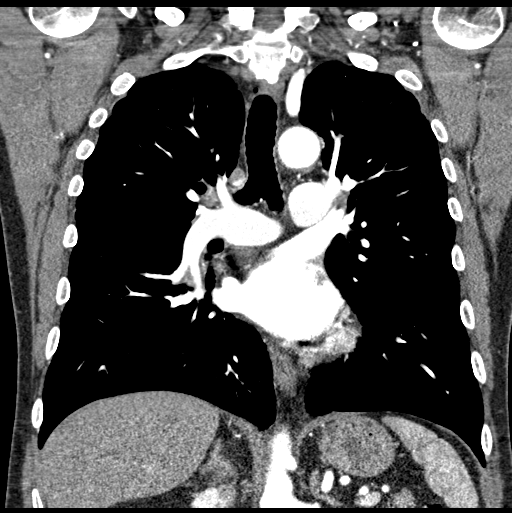
[im 96/128  soft-tissue]
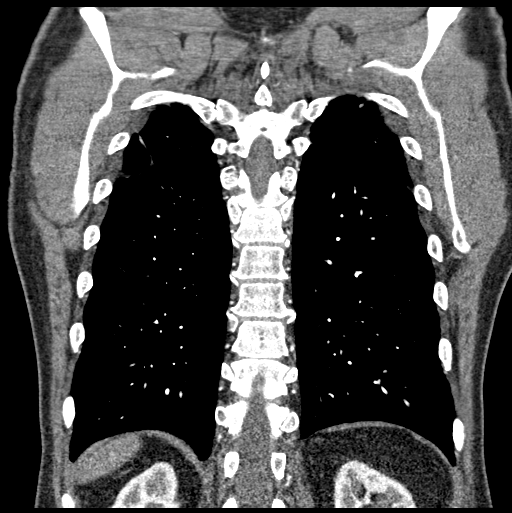

[18 of 46 positions shown; findings below may reference images not displayed]

FINDINGS: Cardiovascular: Heart size normal. No pericardial effusion.
Satisfactory opacification of pulmonary arteries noted, and there is
no evidence of pulmonary emboli. Good contrast opacification of the
thoracic aorta. No dissection or stenosis.

Aortic Root:

--Valve: 3.1 cm

--Sinuses: 4.0 cm

--Sinotubular Junction: 3.4 cm

Limitations by motion: Mild

Thoracic Aorta:

--Ascending Aorta: 3.9 cm (stable since previous)

--Aortic Arch: 3.1 cm

--Descending Aorta:

Classic 3 vessel brachiocephalic origin anatomy without proximal
stenosis. Visualized proximal abdominal aorta unremarkable.

Mediastinum/Nodes: Partially calcified subcentimeter right hilar and
subcarinal lymph nodes. No mass or adenopathy.

Lungs/Pleura: No pleural effusion. No pneumothorax. 6 mm nodule,
right middle lobe (Im64,Se5) , stable since 05/06/2015, likely
benign. Subcentimeter calcified granuloma anterior to the inferior
right pulmonary vein. Lungs are otherwise clear.

Upper Abdomen: No acute findings.

Musculoskeletal: No chest wall abnormality. No acute or significant
osseous findings.

Review of the MIP images confirms the above findings.
IMPRESSION: Stable 3.9 cm ascending aorta, 3.1 cm arch aneurysm without
complicating features. Recommend annual imaging followup by CTA or
MRA. This recommendation follows 3444
ACCF/AHA/AATS/ACR/ASA/SCA/NARODNJAK/MOATSHE/JUMPER/DUTERTE Guidelines for the
Diagnosis and Management of Patients with Thoracic Aortic Disease.
Circulation.3444; 121: [id number]

## 2022-04-10 ENCOUNTER — Encounter: Payer: Self-pay | Admitting: Student in an Organized Health Care Education/Training Program

## 2022-04-10 ENCOUNTER — Ambulatory Visit (HOSPITAL_BASED_OUTPATIENT_CLINIC_OR_DEPARTMENT_OTHER): Payer: Medicare PPO | Admitting: Student in an Organized Health Care Education/Training Program

## 2022-04-10 ENCOUNTER — Ambulatory Visit
Admission: RE | Admit: 2022-04-10 | Discharge: 2022-04-10 | Disposition: A | Discharge status: Home / Self Care | Payer: Medicare PPO | Source: Ambulatory Visit | Attending: Student in an Organized Health Care Education/Training Program | Admitting: Student in an Organized Health Care Education/Training Program

## 2022-04-10 VITALS — BP 124/74 | HR 63 | Temp 97.3°F | Resp 16 | Ht 75.0 in | Wt 200.4 lb

## 2022-04-10 DIAGNOSIS — G894 Chronic pain syndrome: Secondary | ICD-10-CM

## 2022-04-10 DIAGNOSIS — G5702 Lesion of sciatic nerve, left lower limb: Secondary | ICD-10-CM | POA: Insufficient documentation

## 2022-04-10 MED ORDER — IOHEXOL 180 MG/ML  SOLN
10.0000 mL | Freq: Once | INTRAMUSCULAR | Status: AC
  Administered 2022-04-10: 10 mL via INTRA_ARTICULAR

## 2022-04-10 MED ORDER — DEXAMETHASONE SODIUM PHOSPHATE 10 MG/ML IJ SOLN
INTRAMUSCULAR | Status: AC
  Filled 2022-04-10: qty 1

## 2022-04-10 MED ORDER — ROPIVACAINE HCL 2 MG/ML IJ SOLN
INTRAMUSCULAR | Status: AC
  Filled 2022-04-10: qty 20

## 2022-04-10 MED ORDER — IOHEXOL 180 MG/ML  SOLN
INTRAMUSCULAR | Status: AC
  Filled 2022-04-10: qty 20

## 2022-04-10 MED ORDER — DEXAMETHASONE SODIUM PHOSPHATE 10 MG/ML IJ SOLN
10.0000 mg | Freq: Once | INTRAMUSCULAR | Status: AC
  Administered 2022-04-10: 10 mg

## 2022-04-10 MED ORDER — LIDOCAINE HCL 2 % IJ SOLN
20.0000 mL | Freq: Once | INTRAMUSCULAR | Status: AC
  Administered 2022-04-10: 400 mg

## 2022-04-10 MED ORDER — LIDOCAINE HCL 2 % IJ SOLN
INTRAMUSCULAR | Status: AC
  Filled 2022-04-10: qty 20

## 2022-04-10 MED ORDER — ROPIVACAINE HCL 2 MG/ML IJ SOLN
4.0000 mL | Freq: Once | INTRAMUSCULAR | Status: AC
  Administered 2022-04-10: 4 mL via INTRA_ARTICULAR

## 2022-04-10 NOTE — Progress Notes (Signed)
Safety precautions to be maintained throughout the outpatient stay will include: orient to surroundings, keep bed in low position, maintain call bell within reach at all times, provide assistance with transfer out of bed and ambulation.  

## 2022-04-10 NOTE — Patient Instructions (Signed)

## 2022-04-10 NOTE — Progress Notes (Signed)
Okay PROVIDER NOTE: Information contained herein reflects review and annotations entered in association with encounter. Interpretation of such information and data should be left to medically-trained personnel. Information provided to patient can be located elsewhere in the medical record under "Patient Instructions". Document created using STT-dictation technology, any transcriptional errors that may result from process are unintentional.  ?  ?Patient: Patrick Moyer  Service Category: Procedure  Provider: Edward Jolly, MD  ?DOB: 04/06/1951  DOS: 04/10/2022  Location: [facility]  ?MRN: [id number]  Setting: Ambulatory - outpatient  Referring Provider: Daisy Floro, MD  ?Type: Established Patient  Specialty: Interventional Pain Management  PCP: Daisy Floro, MD  ? ?Primary Reason for Visit: Interventional Pain Management Treatment. ?CC: Leg Pain (left) ? ? ?Procedure:          Anesthesia, Analgesia, Anxiolysis:  ?Type: Left piriformis trigger point injection #12  under fluoroscopic guidance ?Region: Inferior Lumbosacral Region ?Level: PIIS (Posterior Inferior Iliac Spine) 1 cm deep, 1 cm lateral and 1 cm inferior from posterior inferior fissure of SI joint. ?Laterality: Left-Side   ? ?Position: Prone          ? ?Indications: ?1. Piriformis syndrome of left side   ?2. Chronic pain syndrome   ? ? ? ? ? ?Pain Score: ?Pre-procedure: 3/10 ?Post-procedure: 0-No pain/10  ? ?Pre-op H&P Assessment:  ?Patrick Moyer is a 71 y.o. (year old), male patient, seen today for interventional treatment. He  has a past surgical history that includes Nasal septum surgery (1975); Wisdom tooth extraction (1975); Finger exploration (Left, 1995); Laparoscopic appendectomy (2010); Inguinal hernia repair (Left, 1978); and Transurethral resection of prostate (N/A, 05/05/2020). Patrick Moyer has a current medication list which includes the following prescription(s): acetaminophen, acyclovir ointment, biotene dry mouth, b  complex vitamins, ca carbonate-mag hydroxide, celecoxib, cholecalciferol, fexofenadine, fiber, fluticasone, gabapentin, glucosamine-chondroitin, guaifenesin, l-lysine, montelukast, nystatin, omeprazole, rosuvastatin, sodium fluoride 5000 ppm, and valacyclovir hcl. His primarily concern today is the Leg Pain (left) ? ? ?Initial Vital Signs:  ?Pulse/HCG Rate: 68  ?Temp: (!) 97.3 ?F (36.3 ?C) ?Resp: 16 ?BP: 116/71 ?SpO2: 100 % ? ?BMI: Estimated body mass index is 25.05 kg/m? as calculated from the following: ?  Height as of this encounter: 6\' 3"  (1.905 m). ?  Weight as of this encounter: 200 lb 6.4 oz (90.9 kg). ? ?Risk Assessment: ?Allergies: Reviewed. He is allergic to other, cialis [tadalafil], rosuvastatin, sildenafil, and soy protein [soybean oil].  ?Allergy Precautions: None required ?Coagulopathies: Reviewed. None identified.  ?Blood-thinner therapy: None at this time ?Active Infection(s): Reviewed. None identified. Patrick Moyer is afebrile ? ?Site Confirmation: Patrick Moyer was asked to confirm the procedure and laterality before marking the site ?Procedure checklist: Completed ?Consent: Before the procedure and under the influence of no sedative(s), amnesic(s), or anxiolytics, the patient was informed of the treatment options, risks and possible complications. To fulfill our ethical and legal obligations, as recommended by the American Medical Association's Code of Ethics, I have informed the patient of my clinical impression; the nature and purpose of the treatment or procedure; the risks, benefits, and possible complications of the intervention; the alternatives, including doing nothing; the risk(s) and benefit(s) of the alternative treatment(s) or procedure(s); and the risk(s) and benefit(s) of doing nothing. ?The patient was provided information about the general risks and possible complications associated with the procedure. These may include, but are not limited to: failure to achieve desired goals,  infection, bleeding, organ or nerve damage, allergic reactions, paralysis, and death. ?In addition, the  patient was informed of those risks and complications associated to the procedure, such as failure to decrease pain; infection; bleeding; organ or nerve damage with subsequent damage to sensory, motor, and/or autonomic systems, resulting in permanent pain, numbness, and/or weakness of one or several areas of the body; allergic reactions; (i.e.: anaphylactic reaction); and/or death. ?Furthermore, the patient was informed of those risks and complications associated with the medications. These include, but are not limited to: allergic reactions (i.e.: anaphylactic or anaphylactoid reaction(s)); adrenal axis suppression; blood sugar elevation that in diabetics may result in ketoacidosis or comma; water retention that in patients with history of congestive heart failure may result in shortness of breath, pulmonary edema, and decompensation with resultant heart failure; weight gain; swelling or edema; medication-induced neural toxicity; particulate matter embolism and blood vessel occlusion with resultant organ, and/or nervous system infarction; and/or aseptic necrosis of one or more joints. ?Finally, the patient was informed that Medicine is not an exact science; therefore, there is also the possibility of unforeseen or unpredictable risks and/or possible complications that may result in a catastrophic outcome. The patient indicated having understood very clearly. We have given the patient no guarantees and we have made no promises. Enough time was given to the patient to ask questions, all of which were answered to the patient's satisfaction. Patrick Moyer has indicated that he wanted to continue with the procedure. ?Attestation: I, the ordering provider, attest that I have discussed with the patient the benefits, risks, side-effects, alternatives, likelihood of achieving goals, and potential problems during recovery for  the procedure that I have provided informed consent. ?Date  Time: 04/10/2022  8:31 AM ? ?Pre-Procedure Preparation:  ?Monitoring: As per clinic protocol. Respiration, ETCO2, SpO2, BP, heart rate and rhythm monitor placed and checked for adequate function ?Safety Precautions: Patient was assessed for positional comfort and pressure points before starting the procedure. ?Time-out: I initiated and conducted the "Time-out" before starting the procedure, as per protocol. The patient was asked to participate by confirming the accuracy of the "Time Out" information. Verification of the correct person, site, and procedure were performed and confirmed by me, the nursing staff, and the patient. "Time-out" conducted as per Joint Commission's Universal Protocol (UP.01.01.01). ?Time: 6803 ? ?Description of Procedure:          ?Target Area: 1 cm deep, 1 cm lateral, 1 cm inferior to the Inferior, posterior, aspect of the sacroiliac fissure ?Approach: Posterior, paraspinal, ipsilateral approach. ?Area Prepped: Entire Lower Lumbosacral Region ?DuraPrep (Iodine Povacrylex [0.7% available iodine] and Isopropyl Alcohol, 74% w/w) ?Safety Precautions: Aspiration looking for blood return was conducted prior to all injections. At no point did we inject any substances, as a needle was being advanced. No attempts were made at seeking any paresthesias. Safe injection practices and needle disposal techniques used. Medications properly checked for expiration dates. SDV (single dose vial) medications used. ?Description of the Procedure: Protocol guidelines were followed. The patient was placed in position over the procedure table. The target area was identified and the area prepped in the usual manner. Skin & deeper tissues infiltrated with local anesthetic. Appropriate amount of time allowed to pass for local anesthetics to take effect. The procedure needle was advanced under fluoroscopic guidance into the sacroiliac joint until a firm endpoint  was obtained. Proper needle placement secured. Negative aspiration confirmed. Solution injected in intermittent fashion, asking for systemic symptoms every 0.5cc of injectate. The needles were then removed and t

## 2022-04-11 ENCOUNTER — Telehealth: Payer: Self-pay | Admitting: *Deleted

## 2022-04-11 NOTE — Telephone Encounter (Signed)
No problems post procedure. 

## 2022-05-01 ENCOUNTER — Telehealth: Payer: Self-pay | Admitting: *Deleted

## 2022-05-01 MED ORDER — LUTEIN-ZEAXANTHIN 25-5 MG PO CAPS: 1.0000 | ORAL_CAPSULE | Freq: Every day | ORAL | 0 refills | Status: AC

## 2022-05-01 MED ORDER — POTASSIUM GLUCONATE 550 (90 K) MG PO TABS: 1.0000 | ORAL_TABLET | ORAL | 2 refills | Status: DC

## 2022-05-01 MED ORDER — FISH OIL 1000 MG PO CAPS
1.0000 | ORAL_CAPSULE | Freq: Two times a day (BID) | ORAL | 0 refills | Status: AC
Start: 2022-05-01 — End: 2023-05-02

## 2022-05-01 NOTE — Telephone Encounter (Signed)
? ?  Pre-operative Risk Assessment  ?  ?Patient Name: Patrick Moyer  ?DOB: 14-Mar-1951 ?MRN: [id number]  ? ?  ? ?Request for Surgical Clearance   ? ?Procedure:   Left Total Knee Replacement.  ? ?Date of Surgery:  Clearance TBD                              ?   ?Surgeon:  Dr. Teryl Lucy ?Surgeon's Group or Practice Name:  [facility] ?Phone number:  [phone] ?Fax number:  [phone] ?  ?Type of Clearance Requested:   ?- Medical  ?  ?Type of Anesthesia:   Choice ?  ?Additional requests/questions:   ? ?Signed, ?Imraan Wendell Leanord Asal   ?05/01/2022, 11:35 AM   ?

## 2022-05-01 NOTE — Telephone Encounter (Signed)
Phone appt made for 05/05/22.  Consent in chart.  All medications were reconciled.  ?

## 2022-05-01 NOTE — Telephone Encounter (Signed)
? ? ?  Name: Patrick Moyer  ?DOB: 06-15-1951  ?MRN: [id number] ? ?Primary Cardiologist: Freada Bergeron, MD ? ? ?Preoperative team, please contact this patient and set up a phone call appointment for further preoperative risk assessment. Please obtain consent and complete medication review. Thank you for your help. ? ?I confirm that guidance regarding antiplatelet and oral anticoagulation therapy has been completed and, if necessary, noted below. ? ? ? ?Leanor Kail, PA ?05/01/2022, 11:59 AM ?[facility] ?[address] ?[place], [place] [address] ?  ?

## 2022-05-01 NOTE — Telephone Encounter (Signed)
Phone appt made for 05/05/22.  All medications were reconciled and consent in chart.  ? ?  ?Patient Consent for Virtual Visit  ? ? ?   ? ?Patrick Moyer has provided verbal consent on 05/01/2022 for a virtual visit (video or telephone). ? ? ?CONSENT FOR VIRTUAL VISIT FOR:  Patrick Moyer  ?By participating in this virtual visit I agree to the following: ? ?I hereby voluntarily request, consent and authorize [facility] and its employed or contracted physicians, physician assistants, nurse practitioners or other licensed health care professionals (the Practitioner), to provide me with telemedicine health care services (the ?Services") as deemed necessary by the treating Practitioner. I acknowledge and consent to receive the Services by the Practitioner via telemedicine. I understand that the telemedicine visit will involve communicating with the Practitioner through live audiovisual communication technology and the disclosure of certain medical information by electronic transmission. I acknowledge that I have been given the opportunity to request an in-person assessment or other available alternative prior to the telemedicine visit and am voluntarily participating in the telemedicine visit. ? ?I understand that I have the right to withhold or withdraw my consent to the use of telemedicine in the course of my care at any time, without affecting my right to future care or treatment, and that the Practitioner or I may terminate the telemedicine visit at any time. I understand that I have the right to inspect all information obtained and/or recorded in the course of the telemedicine visit and may receive copies of available information for a reasonable fee.  I understand that some of the potential risks of receiving the Services via telemedicine include:  ?Delay or interruption in medical evaluation due to technological equipment failure or disruption; ?Information transmitted may not be sufficient (e.g. poor resolution  of images) to allow for appropriate medical decision making by the Practitioner; and/or  ?In rare instances, security protocols could fail, causing a breach of personal health information. ? ?Furthermore, I acknowledge that it is my responsibility to provide information about my medical history, conditions and care that is complete and accurate to the best of my ability. I acknowledge that Practitioner's advice, recommendations, and/or decision may be based on factors not within their control, such as incomplete or inaccurate data provided by me or distortions of diagnostic images or specimens that may result from electronic transmissions. I understand that the practice of medicine is not an exact science and that Practitioner makes no warranties or guarantees regarding treatment outcomes. I acknowledge that a copy of this consent can be made available to me via my patient portal [facility] MyChart), or I can request a printed copy by calling the office of [facility].   ? ?I understand that my insurance will be billed for this visit.  ? ?I have read or had this consent read to me. ?I understand the contents of this consent, which adequately explains the benefits and risks of the Services being provided via telemedicine.  ?I have been provided ample opportunity to ask questions regarding this consent and the Services and have had my questions answered to my satisfaction. ?I give my informed consent for the services to be provided through the use of telemedicine in my medical care ? ?  ?

## 2022-05-03 ENCOUNTER — Ambulatory Visit
Admission: RE | Admit: 2022-05-03 | Discharge: 2022-05-03 | Disposition: A | Discharge status: Home / Self Care | Payer: Medicare PPO | Source: Ambulatory Visit | Attending: Student in an Organized Health Care Education/Training Program | Admitting: Student in an Organized Health Care Education/Training Program

## 2022-05-03 ENCOUNTER — Encounter: Payer: Self-pay | Admitting: Student in an Organized Health Care Education/Training Program

## 2022-05-03 ENCOUNTER — Ambulatory Visit (HOSPITAL_BASED_OUTPATIENT_CLINIC_OR_DEPARTMENT_OTHER): Payer: Medicare PPO | Admitting: Student in an Organized Health Care Education/Training Program

## 2022-05-03 VITALS — BP 121/68 | HR 56 | Temp 97.2°F | Resp 16 | Ht 75.0 in | Wt 197.3 lb

## 2022-05-03 DIAGNOSIS — G5702 Lesion of sciatic nerve, left lower limb: Secondary | ICD-10-CM | POA: Diagnosis present

## 2022-05-03 MED ORDER — DEXAMETHASONE SODIUM PHOSPHATE 10 MG/ML IJ SOLN
10.0000 mg | Freq: Once | INTRAMUSCULAR | Status: AC
  Administered 2022-05-03: 10 mg
  Filled 2022-05-03: qty 1

## 2022-05-03 MED ORDER — IOHEXOL 180 MG/ML  SOLN: 10.0000 mL | Freq: Once | INTRAMUSCULAR | Status: DC

## 2022-05-03 MED ORDER — ROPIVACAINE HCL 2 MG/ML IJ SOLN
4.0000 mL | Freq: Once | INTRAMUSCULAR | Status: AC
  Administered 2022-05-03: 4 mL via INTRA_ARTICULAR
  Filled 2022-05-03: qty 20

## 2022-05-03 MED ORDER — DEXAMETHASONE SODIUM PHOSPHATE 10 MG/ML IJ SOLN
INTRAMUSCULAR | Status: AC
  Filled 2022-05-03: qty 1

## 2022-05-03 MED ORDER — GABAPENTIN 600 MG PO TABS: 900.0000 mg | ORAL_TABLET | Freq: Three times a day (TID) | ORAL | 0 refills | Status: DC

## 2022-05-03 MED ORDER — LIDOCAINE HCL 2 % IJ SOLN
20.0000 mL | Freq: Once | INTRAMUSCULAR | Status: AC
  Administered 2022-05-03: 400 mg
  Filled 2022-05-03: qty 20

## 2022-05-03 NOTE — Progress Notes (Signed)
Safety precautions to be maintained throughout the outpatient stay will include: orient to surroundings, keep bed in low position, maintain call bell within reach at all times, provide assistance with transfer out of bed and ambulation.  

## 2022-05-03 NOTE — Addendum Note (Signed)
Addended by: Edward Jolly on: 05/03/2022 09:35 AM ? ? Modules accepted: Orders ? ?

## 2022-05-03 NOTE — Progress Notes (Addendum)
Okay PROVIDER NOTE: Information contained herein reflects review and annotations entered in association with encounter. Interpretation of such information and data should be left to medically-trained personnel. Information provided to patient can be located elsewhere in the medical record under "Patient Instructions". Document created using STT-dictation technology, any transcriptional errors that may result from process are unintentional.  ?  ?Patient: Patrick Moyer  Service Category: Procedure  Provider: Gillis Santa, MD  ?DOB: [birth year]  DOS: 05/03/2022  Location: [facility]  ?MRN: [id number]  Setting: Ambulatory - outpatient  Referring Provider: Lawerance Cruel, MD  ?Type: Established Patient  Specialty: Interventional Pain Management  PCP: Lawerance Cruel, MD  ? ?Primary Reason for Visit: Interventional Pain Management Treatment. ?CC: Hip Pain (Left; ) ? ? ?Procedure:          Anesthesia, Analgesia, Anxiolysis:  ?Type: Left piriformis trigger point injection #13  under fluoroscopic guidance ?Region: Inferior Lumbosacral Region ?Level: PIIS (Posterior Inferior Iliac Spine) 1 cm deep, 1 cm lateral and 1 cm inferior from posterior inferior fissure of SI joint. ?Laterality: Left-Side   ? ?Position: Prone          ? ?Indications: ?1. Piriformis syndrome of left side   ? ? ? ? ? ?Pain Score: ?Pre-procedure: 3/10 ?Post-procedure: 0-No pain/10  ? ?Pre-op H&P Assessment:  ?Patrick Moyer is a 71 y.o. (year old), male patient, seen today for interventional treatment. He  has a past surgical history that includes Nasal septum surgery (1975); Wisdom tooth extraction (1975); Finger exploration (Left, 1995); Laparoscopic appendectomy (2010); Inguinal hernia repair (Left, 1978); and Transurethral resection of prostate (N/A, 05/05/2020). Patrick Moyer has a current medication list which includes the following prescription(s): acetaminophen, acyclovir ointment, biotene dry mouth, b complex vitamins, ca  carbonate-mag hydroxide, celecoxib, cholecalciferol, fexofenadine, fiber, fluticasone, gabapentin, gabapentin, glucosamine-chondroitin, guaifenesin, l-lysine, lutein-zeaxanthin, montelukast, nystatin, fish oil, omeprazole, potassium gluconate, rosuvastatin, sodium fluoride 5000 ppm, and valacyclovir hcl, and the following Facility-Administered Medications: iohexol. His primarily concern today is the Hip Pain (Left; ) ? ? ?Initial Vital Signs:  ?Pulse/HCG Rate: (!) 58  ?Temp: (!) 97.2 ?F (36.2 ?C) ?Resp: 16 ?BP: 127/74 ?SpO2: 99 % ? ?BMI: Estimated body mass index is 24.66 kg/m? as calculated from the following: ?  Height as of this encounter: 6\' 3"  (1.905 m). ?  Weight as of this encounter: 197 lb 4.8 oz (89.5 kg). ? ?Risk Assessment: ?Allergies: Reviewed. He is allergic to other, cialis [tadalafil], rosuvastatin, sildenafil, and soy protein [soybean oil].  ?Allergy Precautions: None required ?Coagulopathies: Reviewed. None identified.  ?Blood-thinner therapy: None at this time ?Active Infection(s): Reviewed. None identified. Patrick Moyer is afebrile ? ?Site Confirmation: Patrick Moyer was asked to confirm the procedure and laterality before marking the site ?Procedure checklist: Completed ?Consent: Before the procedure and under the influence of no sedative(s), amnesic(s), or anxiolytics, the patient was informed of the treatment options, risks and possible complications. To fulfill our ethical and legal obligations, as recommended by the American Medical Association's Code of Ethics, I have informed the patient of my clinical impression; the nature and purpose of the treatment or procedure; the risks, benefits, and possible complications of the intervention; the alternatives, including doing nothing; the risk(s) and benefit(s) of the alternative treatment(s) or procedure(s); and the risk(s) and benefit(s) of doing nothing. ?The patient was provided information about the general risks and possible complications  associated with the procedure. These may include, but are not limited to: failure to achieve desired goals, infection, bleeding, organ or nerve  damage, allergic reactions, paralysis, and death. ?In addition, the patient was informed of those risks and complications associated to the procedure, such as failure to decrease pain; infection; bleeding; organ or nerve damage with subsequent damage to sensory, motor, and/or autonomic systems, resulting in permanent pain, numbness, and/or weakness of one or several areas of the body; allergic reactions; (i.e.: anaphylactic reaction); and/or death. ?Furthermore, the patient was informed of those risks and complications associated with the medications. These include, but are not limited to: allergic reactions (i.e.: anaphylactic or anaphylactoid reaction(s)); adrenal axis suppression; blood sugar elevation that in diabetics may result in ketoacidosis or comma; water retention that in patients with history of congestive heart failure may result in shortness of breath, pulmonary edema, and decompensation with resultant heart failure; weight gain; swelling or edema; medication-induced neural toxicity; particulate matter embolism and blood vessel occlusion with resultant organ, and/or nervous system infarction; and/or aseptic necrosis of one or more joints. ?Finally, the patient was informed that Medicine is not an exact science; therefore, there is also the possibility of unforeseen or unpredictable risks and/or possible complications that may result in a catastrophic outcome. The patient indicated having understood very clearly. We have given the patient no guarantees and we have made no promises. Enough time was given to the patient to ask questions, all of which were answered to the patient's satisfaction. Patrick Moyer has indicated that he wanted to continue with the procedure. ?Attestation: I, the ordering provider, attest that I have discussed with the patient the benefits,  risks, side-effects, alternatives, likelihood of achieving goals, and potential problems during recovery for the procedure that I have provided informed consent. ?Date  Time: 05/03/2022  8:37 AM ? ?Pre-Procedure Preparation:  ?Monitoring: As per clinic protocol. Respiration, ETCO2, SpO2, BP, heart rate and rhythm monitor placed and checked for adequate function ?Safety Precautions: Patient was assessed for positional comfort and pressure points before starting the procedure. ?Time-out: I initiated and conducted the "Time-out" before starting the procedure, as per protocol. The patient was asked to participate by confirming the accuracy of the "Time Out" information. Verification of the correct person, site, and procedure were performed and confirmed by me, the nursing staff, and the patient. "Time-out" conducted as per Joint Commission's Universal Protocol (UP.01.01.01). ?Time: 0912 ? ?Description of Procedure:          ?Target Area: 1 cm deep, 1 cm lateral, 1 cm inferior to the Inferior, posterior, aspect of the sacroiliac fissure ?Approach: Posterior, paraspinal, ipsilateral approach. ?Area Prepped: Entire Lower Lumbosacral Region ?DuraPrep (Iodine Povacrylex [0.7% available iodine] and Isopropyl Alcohol, 74% w/w) ?Safety Precautions: Aspiration looking for blood return was conducted prior to all injections. At no point did we inject any substances, as a needle was being advanced. No attempts were made at seeking any paresthesias. Safe injection practices and needle disposal techniques used. Medications properly checked for expiration dates. SDV (single dose vial) medications used. ?Description of the Procedure: Protocol guidelines were followed. The patient was placed in position over the procedure table. The target area was identified and the area prepped in the usual manner. Skin & deeper tissues infiltrated with local anesthetic. Appropriate amount of time allowed to pass for local anesthetics to take effect.  The procedure needle was advanced under fluoroscopic guidance into the sacroiliac joint until a firm endpoint was obtained. Proper needle placement secured. Negative aspiration confirmed. Solution injected in intermitt

## 2022-05-04 ENCOUNTER — Telehealth: Payer: Self-pay

## 2022-05-04 NOTE — Telephone Encounter (Signed)
Called PP. Denies any needs at this time. 

## 2022-05-05 ENCOUNTER — Ambulatory Visit (INDEPENDENT_AMBULATORY_CARE_PROVIDER_SITE_OTHER): Payer: Medicare PPO | Admitting: General Practice

## 2022-05-05 DIAGNOSIS — Z01810 Encounter for preprocedural cardiovascular examination: Secondary | ICD-10-CM

## 2022-05-05 NOTE — Progress Notes (Signed)
? ?Virtual Visit via Telephone Note  ? ?This visit type was conducted due to national recommendations for restrictions regarding the COVID-19 Pandemic (e.g. social distancing) in an effort to limit this patient's exposure and mitigate transmission in our community.  Due to his co-morbid illnesses, this patient is at least at moderate risk for complications without adequate follow up.  This format is felt to be most appropriate for this patient at this time.  The patient did not have access to video technology/had technical difficulties with video requiring transitioning to audio format only (telephone).  All issues noted in this document were discussed and addressed.  No physical exam could be performed with this format.  Please refer to the patient's chart for his  consent to telehealth for [facility]. ? ?Evaluation Performed:  Preoperative cardiovascular risk assessment ?_____________  ? ?Date:  05/05/2022  ? ?Patient ID:  Patrick Moyer, DOB 1950-12-30, MRN [id number] ?Patient Location:  ?Home ?Provider location:   ?Office ? ?Primary Care Provider:  Daisy Florooss, Charles Alan, MD ?Primary Cardiologist:  Meriam SpragueHeather E Pemberton, MD ? ?Chief Complaint  ?  ?71 y.o. y/o male with a h/o elevated coronary artery calcium score, nonobstructive CAD, ascending aortic aneurysm hyperlipidemia, BPH, who is pending left total knee replacement, and presents today for telephonic preoperative cardiovascular risk assessment. ? ?Past Medical History  ?  ?Past Medical History:  ?Diagnosis Date  ? ALLERGIC RHINITIS   ? Arthritis pain of wrist   ? Bell's palsy   ? per pt 1987 left side, 20% residual effect's eyelid  ? Benign localized prostatic hyperplasia with lower urinary tract symptoms (LUTS)   ? Chronic bilateral low back pain with bilateral sciatica   ? Diverticulosis   ? ED (erectile dysfunction)   ? Environmental and seasonal allergies   ? Family history of Marfan syndrome   ? father, paternal uncle, and brother  ? GERD  (gastroesophageal reflux disease)   ? Hiatal hernia   ? High cholesterol   ? History of closed head injury   ? 04-28-2020 per pt yrs ago w/ no loc, residual mild memory loss which has resolved  ? 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 per pt treated with INH for one year and cxr's since have been normal  ? HSV-1 infection   ? Marfanoid habitus   ? per pt has wing span is longer than his height, effects eyes (iris), hyperflexibility of ankle and hands,  AAA  ? Mild CAD   ? cardiologist--  dr Delton Seenelson---  CTA 10-22-2019  ? Non-celiac gluten sensitivity   ? Numbness and tingling in both hands   ? per pt intermittantly due to cervical spine  ? OSA on CPAP   ? auto 8-10,  pt stated uses every night  ? Pigment dispersion syndrome   ? 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)  ? Polyneuropathy   ? left leg, lumbar nerve   ? Pulmonary nodules   ? per pt followed by pcp (CTA and CT chest 10-22-2019 , stable)  ? Radiculopathy of leg   ? Thoracic ascending aortic aneurysm (HCC)   ? cardiologist--- dr Eloy Endk. nelson--- CTA 10-22-2019  ascending aorta 40mm and aortic root 43mm  ? Wears glasses   ? ?Past Surgical History:  ?Procedure Laterality Date  ? FINGER EXPLORATION Left 1995  ? left middle finger digital nerve repair  ? INGUINAL HERNIA REPAIR Left 1978  ? LAPAROSCOPIC APPENDECTOMY  2010  ? NASAL SEPTUM SURGERY  1975  ? TRANSURETHRAL RESECTION OF PROSTATE N/A 05/05/2020  ? Procedure: TRANSURETHRAL RESECTION OF THE PROSTATE (TURP), BIPOLAR;  Surgeon: Rene Paci, MD;  Location: [facility];  Service: Urology;  Laterality: N/A;  ? WISDOM TOOTH EXTRACTION  1975  ? ? ?Allergies ? ?Allergies  ?Allergen Reactions  ? Other   ?  Adhesive, Caffene-abd pain  ? Cialis [Tadalafil]   ?  Caused acute angle glaucoma  ? Rosuvastatin Other (See Comments)  ?  Causes joint aches  ? Sildenafil   ?  Caused acute angle glaucoma  ? Soy Protein [Soybean Oil]   ?  Gastrointestinal upset  ? ? ?History of Present Illness   ?  ?Patrick Moyer is a 71 y.o. male who presents via audio/video conferencing for a telehealth visit today.  Pt was last seen in cardiology clinic on 02/21/2022 by Dr. Shari Prows.  At that time Patrick Moyer was doing well .  The patient is now pending procedure as outlined above. Since his last visit, he continues to do well from a cardiac standpoint. ? ?Today he denies chest pain, shortness of breath, lower extremity edema, fatigue, palpitations, melena, hematuria, hemoptysis, diaphoresis, weakness, presyncope, syncope, orthopnea, and PND. ? ? ? ?Home Medications  ?  ?Prior to Admission medications   ?Medication Sig Start Date End Date Taking? Authorizing Provider  ?acetaminophen (TYLENOL) 500 MG tablet Take 500 mg by mouth at bedtime.    [provider]  ?acyclovir ointment (ZOVIRAX) 5 % Apply 1 application topically daily as needed.    [provider]  ?Artificial Saliva (BIOTENE DRY MOUTH) LOZG Use as directed in the mouth or throat.    [provider]  ?B Complex Vitamins (B COMPLEX 100 PO) Take 1 tablet by mouth in the morning and at bedtime.    [provider]  ?Ca Carbonate-Mag Hydroxide (ROLAIDS PO) Take by mouth as needed.    [provider]  ?celecoxib (CELEBREX) 200 MG capsule Take 200 mg by mouth 2 (two) times daily.    [provider]  ?cholecalciferol (VITAMIN D) 1000 UNITS tablet Take 1,000 Units by mouth in the morning and at bedtime.    [provider]  ?fexofenadine (ALLEGRA) 180 MG tablet Take 1 tablet by mouth daily. 05/17/21   [provider]  ?FIBER PO Take by mouth daily.    [provider]  ?fluticasone (FLONASE) 50 MCG/ACT nasal spray Place into both nostrils 3 (three) times daily.    [provider]  ?gabapentin (NEURONTIN) 300 MG capsule Take 3 capsules (900 mg total) by mouth 3 (three) times daily. 09/26/21 09/21/22  Edward Jolly, MD  ?gabapentin (NEURONTIN) 600 MG tablet Take 1.5 tablets (900 mg  total) by mouth 3 (three) times daily. 05/03/22 08/01/22  Edward Jolly, MD  ?glucosamine-chondroitin 500-400 MG tablet Take 1 tablet by mouth daily.    [provider]  ?guaiFENesin (MUCINEX) 600 MG 12 hr tablet Take 1,200 mg by mouth 2 (two) times daily.    [provider]  ?L-Lysine 500 MG CAPS Take 1 capsule by mouth in the morning, at noon, and at bedtime.     [provider]  ?Lutein-Zeaxanthin 25-5 MG CAPS Take 1 tablet by mouth daily. 05/01/22   Ronney Asters, NP  ?montelukast (SINGULAIR) 10 MG tablet Take 10 mg by mouth at bedtime.     [provider]  ?nystatin (MYCOSTATIN) 100000 UNIT/ML suspension Take 5 mLs by mouth as needed.  [provider]  ?Omega-3 Fatty Acids (FISH OIL) 1000 MG CAPS Take 1 capsule (1,000 mg total) by mouth in the morning and at bedtime. 05/01/22 05/02/23  Ronney Asters, NP  ?omeprazole (PRILOSEC) 20 MG capsule Take 20 mg by mouth daily.    [provider]  ?Potassium Gluconate 550 (90 K) MG TABS Take 1 tablet by mouth as directed. 5 tablets weekly. 05/01/22   Ronney Asters, NP  ?rosuvastatin (CRESTOR) 5 MG tablet Take 1 tablet by mouth 2-3 times per week 01/25/22   Meriam Sprague, MD  ?SODIUM FLUORIDE 5000 PPM 1.1 % GEL dental gel Take 1 application by mouth daily. 02/01/22   [provider]  ?VALACYCLOVIR HCL PO Take by mouth.    [provider]  ? ? ?Physical Exam  ?  ?Vital Signs:  Patrick Moyer does not have vital signs available for review today. ? ?Given telephonic nature of communication, physical exam is limited. ?AAOx3. NAD. Normal affect.  Speech and respirations are unlabored. ? ?Accessory Clinical Findings  ?  ?None ? ?Assessment & Plan  ?  ?1.  Preoperative Cardiovascular Risk Assessment: ? ?  ? ?Primary Cardiologist: Meriam Sprague, MD ? ?Chart reviewed as part of pre-operative protocol coverage. Given past medical history and time since last visit, based on ACC/AHA guidelines, Patrick Moyer would be at acceptable risk for the planned procedure without further cardiovascular testing.  ? ?Patient was advised that if he develops new symptoms prior to surgery to contact our office to arrange a follow-u

## 2022-05-09 NOTE — Progress Notes (Signed)
Patrick Moyer D.O. [facility] [address] [place] [id number] Phone: [phone] Subjective:    I'm seeing this patient by the request  of:  Daisy Florooss, Charles Alan, MD  CC: Chronic pain follow-up  BJY:[id number]:Subjective  03/21/2022 Discussed with the chronic pain is well with patient being on a very high dose of gabapentin already we will reconsider the possibility of adding Cymbalta low-dose during the day.  Patient will consider it.  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  Differential is quite broad including connective tissue disorders versus no benign hypermobility.  Patient does have enlargement noted of the aortic arch being followed by cardiology.  Patient does have a strong family history of brother being diagnosed as well as well as his children having significant hyperflexibility and difficulty.  Patient does have collapse skin of the feet bilaterally that I do think also contributes to some of the abnormality which does cause some more discomfort and pain in the lumbar spine as well.  We discussed lower impact exercises as well as strengthening conditioning.  We discussed though that we need to make sure that patient is not holding his breath or doing any significant heavy lifting.  We discussed with patient icing regimen and home exercises otherwise.  Increase activity slowly and we discussed over-the-counter medications.  Patient can continue to follow-up with pain management and see how patient does.  Does feel like he gets some improvement with some of the injections.  Follow-up with me again in 6 weeks.  Update 05/15/2022 [name] is a 71 y.o. male coming in with complaint of chronic pain and hypermobility. Patient states that brace for L wrist helped but pain persists. Works well for swimming. New keyboard has also helped wrist pain.  Patient states that it is doing better overall.  States that he really does enjoy the [employer] brace.  Going to have partial knee  replacement in L knee in September.        Past Medical History:  Diagnosis Date   ALLERGIC RHINITIS    Arthritis pain of wrist    Bell's palsy    per pt 1987 left side, 20% residual effect's eyelid   Benign localized prostatic hyperplasia with lower urinary tract symptoms (LUTS)    Chronic bilateral low back pain with bilateral sciatica    Diverticulosis    ED (erectile dysfunction)    Environmental and seasonal allergies    Family history of Marfan syndrome    father, paternal uncle, and brother   GERD (gastroesophageal reflux disease)    Hiatal hernia    High cholesterol    History of closed head injury    04-28-2020 per pt yrs ago w/ no loc, residual mild memory loss which has resolved   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 71   per pt treated with INH for one year and cxr's since have been normal   HSV-1 infection    Marfanoid habitus    per pt has wing span is longer than his height, effects eyes (iris), hyperflexibility of ankle and hands,  AAA   Mild CAD    cardiologist--  dr Delton Seenelson---  CTA 10-22-2019   Non-celiac gluten sensitivity    Numbness and tingling in both hands    per pt intermittantly due to cervical spine   OSA on CPAP    auto 8-10,  pt stated uses every night   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)  Polyneuropathy    left leg, lumbar nerve    Pulmonary nodules    per pt followed by pcp (CTA and CT chest 10-22-2019 , stable)   Radiculopathy of leg    Thoracic ascending aortic aneurysm [facility])    cardiologist--- dr Eloy End--- CTA 10-22-2019  ascending aorta 75mm and aortic root 42mm   Wears glasses    Past Surgical History:  Procedure Laterality Date   FINGER EXPLORATION Left 1995   left middle finger digital nerve repair   INGUINAL HERNIA REPAIR Left 1978   LAPAROSCOPIC APPENDECTOMY  2010   NASAL SEPTUM SURGERY  1975   TRANSURETHRAL RESECTION OF PROSTATE N/A 05/05/2020   Procedure: TRANSURETHRAL  RESECTION OF THE PROSTATE (TURP), BIPOLAR;  Surgeon: Rene Paci, MD;  Location: [facility];  Service: Urology;  Laterality: N/A;   WISDOM TOOTH EXTRACTION  1975   Social History   Socioeconomic History   Marital status: Married    Spouse name: Not on file   Number of children: 2   Years of education: Not on file   Highest education level: Not on file  Occupational History   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    Employer: [employer]  Tobacco Use   Smoking status: Never   Smokeless tobacco: Never  Vaping Use   Vaping Use: Never used  Substance and Sexual Activity   Alcohol use: No   Drug use: Never   Sexual activity: Not on file  Other Topics Concern   Not on file  Social History Narrative   Not on file   Social Determinants of Health   Financial Resource Strain: Not on file  Food Insecurity: Not on file  Transportation Needs: Not on file  Physical Activity: Not on file  Stress: Not on file  Social Connections: Not on file   Allergies  Allergen Reactions   Other     Adhesive, Caffene-abd pain   Cialis [Tadalafil]     Caused acute angle glaucoma   Rosuvastatin Other (See Comments)    Causes joint aches   Sildenafil     Caused acute angle glaucoma   Soy Protein [Soybean Oil]     Gastrointestinal upset   Family History  Problem Relation Age of Onset   Aortic aneurysm Father        abdominal   Hypertension Father    CAD Father    Ovarian cancer Mother        cysto adenocarcenoma   Osteoarthritis Sister    Glaucoma Brother    Bipolar disorder Brother    Marfan syndrome Brother    Obesity Brother      Current Outpatient Medications (Cardiovascular):    rosuvastatin (CRESTOR) 5 MG tablet, Take 1 tablet by mouth 2-3 times per week  Current Outpatient Medications (Respiratory):    fexofenadine (ALLEGRA) 180 MG tablet, Take 1 tablet by mouth daily.   fluticasone (FLONASE) 50 MCG/ACT nasal spray, Place into both nostrils 3 (three)  times daily.   guaiFENesin (MUCINEX) 600 MG 12 hr tablet, Take 1,200 mg by mouth 2 (two) times daily.   montelukast (SINGULAIR) 10 MG tablet, Take 10 mg by mouth at bedtime.   Current Outpatient Medications (Analgesics):    acetaminophen (TYLENOL) 500 MG tablet, Take 500 mg by mouth at bedtime.   celecoxib (CELEBREX) 200 MG capsule, Take 200 mg by mouth 2 (two) times daily.   Current Outpatient Medications (Other):    acyclovir ointment (ZOVIRAX) 5 %, Apply 1 application topically daily as needed.  Artificial Saliva (BIOTENE DRY MOUTH) LOZG, Use as directed in the mouth or throat.   B Complex Vitamins (B COMPLEX 100 PO), Take 1 tablet by mouth in the morning and at bedtime.   Ca Carbonate-Mag Hydroxide (ROLAIDS PO), Take by mouth as needed.   cholecalciferol (VITAMIN D) 1000 UNITS tablet, Take 1,000 Units by mouth in the morning and at bedtime.   FIBER PO, Take by mouth daily.   gabapentin (NEURONTIN) 300 MG capsule, Take 3 capsules (900 mg total) by mouth 3 (three) times daily.   gabapentin (NEURONTIN) 600 MG tablet, Take 1.5 tablets (900 mg total) by mouth 3 (three) times daily.   glucosamine-chondroitin 500-400 MG tablet, Take 1 tablet by mouth daily.   L-Lysine 500 MG CAPS, Take 1 capsule by mouth in the morning, at noon, and at bedtime.    Lutein-Zeaxanthin 25-5 MG CAPS, Take 1 tablet by mouth daily.   nystatin (MYCOSTATIN) 100000 UNIT/ML suspension, Take 5 mLs by mouth as needed.    Omega-3 Fatty Acids (FISH OIL) 1000 MG CAPS, Take 1 capsule (1,000 mg total) by mouth in the morning and at bedtime.   omeprazole (PRILOSEC) 20 MG capsule, Take 20 mg by mouth daily.   Potassium Gluconate 550 (90 K) MG TABS, Take 1 tablet by mouth as directed. 5 tablets weekly.   SODIUM FLUORIDE 5000 PPM 1.1 % GEL dental gel, Take 1 application by mouth daily.   VALACYCLOVIR HCL PO, Take by mouth.     Review of Systems:  No headache, visual changes, nausea, vomiting, diarrhea, constipation,  dizziness, abdominal pain, skin rash, fevers, chills, night sweats, weight loss, swollen lymph nodes, joint swelling, chest pain, shortness of breath, mood changes. POSITIVE muscle aches, body aches  Objective  Blood pressure 132/88, pulse (!) 57, height 6\' 3"  (1.905 m), weight 202 lb (91.6 kg), SpO2 99 %.   General: No apparent distress alert and oriented x3 mood and affect normal, dressed appropriately.  HEENT: Pupils equal, extraocular movements intact  Respiratory: Patient's speak in full sentences and does not appear short of breath  Cardiovascular: No lower extremity edema, non tender, no erythema  Gait mild antalgic, patient does have significant hypermobility of multiple joints noted.  Patient's FABER test still has some discomfort in the left piriformis but not as severe as what it has been previously.    Impression and Recommendations:     The above documentation has been reviewed and is accurate and complete , DO

## 2022-05-15 ENCOUNTER — Encounter: Payer: Self-pay | Admitting: Family Medicine

## 2022-05-15 ENCOUNTER — Ambulatory Visit (INDEPENDENT_AMBULATORY_CARE_PROVIDER_SITE_OTHER): Payer: Medicare PPO | Admitting: Family Medicine

## 2022-05-15 DIAGNOSIS — G894 Chronic pain syndrome: Secondary | ICD-10-CM | POA: Diagnosis not present

## 2022-05-15 NOTE — Assessment & Plan Note (Addendum)
Discussed with patient again at great length.  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  Patient will be traveling for the next 9 weeks and did not feel this is a time to make a significant change.  We discussed with patient about continuing different exercises and trying to incorporate some more of the strength training.  Follow-up with me again in 3 months to further evaluate.  Total time reviewing outside charts including most recent injection and discussing with patient about different treatment options 33 minutes

## 2022-05-15 NOTE — Patient Instructions (Signed)
Good to see you Have a great time in [place] Read about PRP which we could do on piriformis Blue band for abductors (side to side) See you again in 2-3 months

## 2022-05-24 ENCOUNTER — Ambulatory Visit
Admission: RE | Admit: 2022-05-24 | Discharge: 2022-05-24 | Disposition: A | Discharge status: Home / Self Care | Payer: Medicare PPO | Source: Ambulatory Visit | Attending: Student in an Organized Health Care Education/Training Program | Admitting: Student in an Organized Health Care Education/Training Program

## 2022-05-24 ENCOUNTER — Ambulatory Visit
Payer: Medicare PPO | Attending: Student in an Organized Health Care Education/Training Program | Admitting: Student in an Organized Health Care Education/Training Program

## 2022-05-24 ENCOUNTER — Encounter: Payer: Self-pay | Admitting: Student in an Organized Health Care Education/Training Program

## 2022-05-24 VITALS — BP 130/84 | HR 67 | Temp 97.9°F | Resp 16 | Ht 75.0 in | Wt 198.0 lb

## 2022-05-24 DIAGNOSIS — M5117 Intervertebral disc disorders with radiculopathy, lumbosacral region: Secondary | ICD-10-CM | POA: Diagnosis not present

## 2022-05-24 DIAGNOSIS — G5702 Lesion of sciatic nerve, left lower limb: Secondary | ICD-10-CM | POA: Diagnosis present

## 2022-05-24 DIAGNOSIS — G894 Chronic pain syndrome: Secondary | ICD-10-CM | POA: Insufficient documentation

## 2022-05-24 MED ORDER — DEXAMETHASONE SODIUM PHOSPHATE 10 MG/ML IJ SOLN
10.0000 mg | Freq: Once | INTRAMUSCULAR | Status: AC
  Administered 2022-05-24: 10 mg
  Filled 2022-05-24: qty 1

## 2022-05-24 MED ORDER — IOHEXOL 180 MG/ML  SOLN
10.0000 mL | Freq: Once | INTRAMUSCULAR | Status: AC
  Administered 2022-05-24: 5 mL via INTRA_ARTICULAR

## 2022-05-24 MED ORDER — ROPIVACAINE HCL 2 MG/ML IJ SOLN
9.0000 mL | Freq: Once | INTRAMUSCULAR | Status: AC
  Administered 2022-05-24: 9 mL via PERINEURAL
  Filled 2022-05-24: qty 20

## 2022-05-24 MED ORDER — LIDOCAINE HCL 2 % IJ SOLN
20.0000 mL | Freq: Once | INTRAMUSCULAR | Status: AC
  Administered 2022-05-24: 400 mg
  Filled 2022-05-24: qty 40

## 2022-05-24 NOTE — Progress Notes (Signed)
Safety precautions to be maintained throughout the outpatient stay will include: orient to surroundings, keep bed in low position, maintain call bell within reach at all times, provide assistance with transfer out of bed and ambulation.  

## 2022-05-24 NOTE — Progress Notes (Signed)
Okay PROVIDER NOTE: Information contained herein reflects review and annotations entered in association with encounter. Interpretation of such information and data should be left to medically-trained personnel. Information provided to patient can be located elsewhere in the medical record under "Patient Instructions". Document created using STT-dictation technology, any transcriptional errors that may result from process are unintentional.    Patient: Patrick Moyer  Service Category: Procedure  Provider: Edward Jolly, MD  DOB: Jun 18, 1951  DOS: 05/24/2022  Location: [facility]  MRN: [id number]  Setting: Ambulatory - outpatient  Referring Provider: Daisy Floro, MD  Type: Established Patient  Specialty: Interventional Pain Management  PCP: Daisy Floro, MD   Primary Reason for Visit: Interventional Pain Management Treatment. CC: Hip Pain (left)   Procedure:          Anesthesia, Analgesia, Anxiolysis:  Type: Left piriformis trigger point injection #14  under fluoroscopic guidance Region: Inferior Lumbosacral Region Level: PIIS (Posterior Inferior Iliac Spine) 1 cm deep, 1 cm lateral and 1 cm inferior from posterior inferior fissure of SI joint. Laterality: Left-Side    Position: Prone           Indications: 1. Piriformis syndrome of left side   2. Chronic pain syndrome        Pain Score: Pre-procedure: 5/10 Post-procedure: 3 /10   Pre-op H&P Assessment:  Patrick Moyer is a 71 y.o. (year old), male patient, seen today for interventional treatment. He  has a past surgical history that includes Nasal septum surgery (1975); Wisdom tooth extraction (1975); Finger exploration (Left, 1995); Laparoscopic appendectomy (2010); Inguinal hernia repair (Left, 1978); and Transurethral resection of prostate (N/A, 05/05/2020). Patrick Moyer has a current medication list which includes the following prescription(s): acetaminophen, acyclovir ointment, biotene dry mouth, b complex  vitamins, ca carbonate-mag hydroxide, celecoxib, cholecalciferol, fexofenadine, fiber, fluticasone, gabapentin, gabapentin, glucosamine-chondroitin, guaifenesin, l-lysine, lutein-zeaxanthin, montelukast, nystatin, fish oil, omeprazole, potassium gluconate, rosuvastatin, sodium fluoride 5000 ppm, and valacyclovir hcl. His primarily concern today is the Hip Pain (left)   Initial Vital Signs:  Pulse/HCG Rate: 68  Temp: 97.9 F (36.6 C) Resp: 16 BP: 106/75 SpO2: 98 %  BMI: Estimated body mass index is 24.75 kg/m as calculated from the following:   Height as of this encounter: 6\' 3"  (1.905 m).   Weight as of this encounter: 198 lb (89.8 kg).  Risk Assessment: Allergies: Reviewed. He is allergic to other, cialis [tadalafil], rosuvastatin, sildenafil, and soy protein [soybean oil].  Allergy Precautions: None required Coagulopathies: Reviewed. None identified.  Blood-thinner therapy: None at this time Active Infection(s): Reviewed. None identified. Patrick Moyer is afebrile  Site Confirmation: Patrick Moyer was asked to confirm the procedure and laterality before marking the site Procedure checklist: Completed Consent: Before the procedure and under the influence of no sedative(s), amnesic(s), or anxiolytics, the patient was informed of the treatment options, risks and possible complications. To fulfill our ethical and legal obligations, as recommended by the American Medical Association's Code of Ethics, I have informed the patient of my clinical impression; the nature and purpose of the treatment or procedure; the risks, benefits, and possible complications of the intervention; the alternatives, including doing nothing; the risk(s) and benefit(s) of the alternative treatment(s) or procedure(s); and the risk(s) and benefit(s) of doing nothing. The patient was provided information about the general risks and possible complications associated with the procedure. These may include, but are not limited to:  failure to achieve desired goals, infection, bleeding, organ or nerve damage, allergic reactions, paralysis, and death.  In addition, the patient was informed of those risks and complications associated to the procedure, such as failure to decrease pain; infection; bleeding; organ or nerve damage with subsequent damage to sensory, motor, and/or autonomic systems, resulting in permanent pain, numbness, and/or weakness of one or several areas of the body; allergic reactions; (i.e.: anaphylactic reaction); and/or death. Furthermore, the patient was informed of those risks and complications associated with the medications. These include, but are not limited to: allergic reactions (i.e.: anaphylactic or anaphylactoid reaction(s)); adrenal axis suppression; blood sugar elevation that in diabetics may result in ketoacidosis or comma; water retention that in patients with history of congestive heart failure may result in shortness of breath, pulmonary edema, and decompensation with resultant heart failure; weight gain; swelling or edema; medication-induced neural toxicity; particulate matter embolism and blood vessel occlusion with resultant organ, and/or nervous system infarction; and/or aseptic necrosis of one or more joints. Finally, the patient was informed that Medicine is not an exact science; therefore, there is also the possibility of unforeseen or unpredictable risks and/or possible complications that may result in a catastrophic outcome. The patient indicated having understood very clearly. We have given the patient no guarantees and we have made no promises. Enough time was given to the patient to ask questions, all of which were answered to the patient's satisfaction. Patrick Moyer has indicated that he wanted to continue with the procedure. Attestation: I, the ordering provider, attest that I have discussed with the patient the benefits, risks, side-effects, alternatives, likelihood of achieving goals, and  potential problems during recovery for the procedure that I have provided informed consent. Date  Time: 05/24/2022 10:00 AM  Pre-Procedure Preparation:  Monitoring: As per clinic protocol. Respiration, ETCO2, SpO2, BP, heart rate and rhythm monitor placed and checked for adequate function Safety Precautions: Patient was assessed for positional comfort and pressure points before starting the procedure. Time-out: I initiated and conducted the "Time-out" before starting the procedure, as per protocol. The patient was asked to participate by confirming the accuracy of the "Time Out" information. Verification of the correct person, site, and procedure were performed and confirmed by me, the nursing staff, and the patient. "Time-out" conducted as per Joint Commission's Universal Protocol (UP.01.01.01). Time: 1020  Description of Procedure:          Target Area: 1 cm deep, 1 cm lateral, 1 cm inferior to the Inferior, posterior, aspect of the sacroiliac fissure Approach: Posterior, paraspinal, ipsilateral approach. Area Prepped: Entire Lower Lumbosacral Region DuraPrep (Iodine Povacrylex [0.7% available iodine] and Isopropyl Alcohol, 74% w/w) Safety Precautions: Aspiration looking for blood return was conducted prior to all injections. At no point did we inject any substances, as a needle was being advanced. No attempts were made at seeking any paresthesias. Safe injection practices and needle disposal techniques used. Medications properly checked for expiration dates. SDV (single dose vial) medications used. Description of the Procedure: Protocol guidelines were followed. The patient was placed in position over the procedure table. The target area was identified and the area prepped in the usual manner. Skin & deeper tissues infiltrated with local anesthetic. Appropriate amount of time allowed to pass for local anesthetics to take effect. The procedure needle was advanced under fluoroscopic guidance into the  sacroiliac joint until a firm endpoint was obtained. Proper needle placement secured. Negative aspiration confirmed. Solution injected in intermittent fashion, asking for systemic symptoms every 0.5cc of injectate. The needles were then removed and the area cleansed, making sure to leave some of the prepping  solution back to take advantage of its long term bactericidal properties. Vitals:   05/24/22 1002 05/24/22 1020 05/24/22 1024  BP: 106/75 131/82 130/84  Pulse: 68 67 67  Resp: 16 18 16   Temp: 97.9 F (36.6 C)    TempSrc: Temporal    SpO2: 98% 96% 97%  Weight: 198 lb (89.8 kg)    Height: 6\' 3"  (1.905 m)           Start Time: 1020 hrs. End Time: 1022 hrs. Materials:  Needle(s) Type: Spinal Needle Gauge: 25G Length: 3.5-in Medication(s): Please see orders for medications and dosing details.  7cc solution made of 6cc of 0.2% ropivacaine, 1 cc of Decadron 10 mg/cc.  Injected into the left piriformis muscle after contrast confirmation highlighting piriformis muscle striations.  No radiating pain down left leg during injection. Imaging Guidance (Non-Spinal):          Type of Imaging Technique: Fluoroscopy Guidance (Non-Spinal) Indication(s): Assistance in needle guidance and placement for procedures requiring needle placement in or near specific anatomical locations not easily accessible without such assistance. Exposure Time: Please see nurses notes. Contrast: Before injecting any contrast, we confirmed that the patient did not have an allergy to iodine, shellfish, or radiological contrast. Once satisfactory needle placement was completed at the desired level, radiological contrast was injected. Contrast injected under live fluoroscopy. No contrast complications. See chart for type and volume of contrast used. Fluoroscopic Guidance: I was personally present during the use of fluoroscopy. "Tunnel Vision Technique" used to obtain the best possible view of the target area. Parallax error  corrected before commencing the procedure. "Direction-depth-direction" technique used to introduce the needle under continuous pulsed fluoroscopy. Once target was reached, antero-posterior, oblique, and lateral fluoroscopic projection used confirm needle placement in all planes. Images permanently stored in EMR. Interpretation: I personally interpreted the imaging intraoperatively. Adequate needle placement confirmed in multiple planes. Appropriate spread of contrast into desired area was observed. No evidence of afferent or efferent intravascular uptake. Permanent images saved into the patient's record.  Post-operative Assessment:  Post-procedure Vital Signs:  Pulse/HCG Rate: 67  Temp: 97.9 F (36.6 C) Resp: 16 BP: 130/84 SpO2: 97 %  EBL: None  Complications: No immediate post-treatment complications observed by team, or reported by patient.  Note: The patient tolerated the entire procedure well. A repeat set of vitals were taken after the procedure and the patient was kept under observation following institutional policy, for this type of procedure. Post-procedural neurological assessment was performed, showing return to baseline, prior to discharge. The patient was provided with post-procedure discharge instructions, including a section on how to identify potential problems. Should any problems arise concerning this procedure, the patient was given instructions to immediately contact , at any time, without hesitation. In any case, we plan to contact the patient by telephone for a follow-up status report regarding this interventional procedure.  Comments:  No additional relevant information.  Plan of Care  Orders:  Orders Placed This Encounter  Procedures   DG PAIN CLINIC C-ARM 1-60 MIN NO REPORT    Intraoperative interpretation by procedural physician at [facility].    Standing Status:   Standing    Number of Occurrences:   1    Order Specific Question:   Reason for exam:     Answer:   Assistance in needle guidance and placement for procedures requiring needle placement in or near specific anatomical locations not easily accessible without such assistance.      Medications ordered for procedure:  Meds ordered this encounter  Medications   lidocaine (XYLOCAINE) 2 % (with pres) injection 400 mg   iohexol (OMNIPAQUE) 180 MG/ML injection 10 mL    Must be Myelogram-compatible. If not available, you may substitute with a water-soluble, non-ionic, hypoallergenic, myelogram-compatible radiological contrast medium.   dexamethasone (DECADRON) injection 10 mg   ropivacaine (PF) 2 mg/mL (0.2%) (NAROPIN) injection 9 mL      Medications administered: We administered lidocaine, iohexol, dexamethasone, and ropivacaine (PF) 2 mg/mL (0.2%).  See the medical record for exact dosing, route, and time of administration.  Follow-up plan:   Return for PRN- repeat Piriformis TPI.     Recent Visits Date Type Provider Dept  05/03/22 Procedure visit Edward Jolly, MD [facility]  04/10/22 Procedure visit Edward Jolly, MD [facility]  03/20/22 Procedure visit Edward Jolly, MD [facility]  Showing recent visits within past 90 days and meeting all other requirements Today's Visits Date Type Provider Dept  05/24/22 Procedure visit Edward Jolly, MD [facility]  Showing today's visits and meeting all other requirements Future Appointments Date Type Provider Dept  08/09/22 Appointment Edward Jolly, MD [facility]  Showing future appointments within next 90 days and meeting all other requirements  Disposition: Discharge home  Discharge (Date  Time): 05/24/2022; 1030 hrs.   Primary Care Physician: Daisy Floro, MD Location: [facility] Note by: Edward Jolly, MD Date: 05/24/2022; Time: 11:13 AM  Disclaimer:  Medicine is not an exact science. The only guarantee in medicine is that nothing  is guaranteed. It is important to note that the decision to proceed with this intervention was based on the information collected from the patient. The Data and conclusions were drawn from the patient's questionnaire, the interview, and the physical examination. Because the information was provided in large part by the patient, it cannot be guaranteed that it has not been purposely or unconsciously manipulated. Every effort has been made to obtain as much relevant data as possible for this evaluation. It is important to note that the conclusions that lead to this procedure are derived in large part from the available data. Always take into account that the treatment will also be dependent on availability of resources and existing treatment guidelines, considered by other Pain Management Practitioners as being common knowledge and practice, at the time of the intervention. For Medico-Legal purposes, it is also important to point out that variation in procedural techniques and pharmacological choices are the acceptable norm. The indications, contraindications, technique, and results of the above procedure should only be interpreted and judged by a Board-Certified Interventional Pain Specialist with extensive familiarity and expertise in the same exact procedure and technique.

## 2022-05-25 ENCOUNTER — Telehealth: Payer: Self-pay | Admitting: Student in an Organized Health Care Education/Training Program

## 2022-05-25 ENCOUNTER — Telehealth: Payer: Self-pay | Admitting: *Deleted

## 2022-05-25 NOTE — Telephone Encounter (Signed)
Attempted to call for post procedure follow-up. No answer, no voicemail. 

## 2022-05-25 NOTE — Telephone Encounter (Signed)
Pt stated he missed our call and he had a procedure on 5/31. Pt said that everything was fine and he did not need a phone call back.

## 2022-08-08 DIAGNOSIS — Z79899 Other long term (current) drug therapy: Secondary | ICD-10-CM | POA: Diagnosis not present

## 2022-08-08 DIAGNOSIS — M0609 Rheumatoid arthritis without rheumatoid factor, multiple sites: Secondary | ICD-10-CM | POA: Diagnosis not present

## 2022-08-08 DIAGNOSIS — Z791 Long term (current) use of non-steroidal anti-inflammatories (NSAID): Secondary | ICD-10-CM | POA: Diagnosis not present

## 2022-08-08 DIAGNOSIS — M1990 Unspecified osteoarthritis, unspecified site: Secondary | ICD-10-CM | POA: Diagnosis not present

## 2022-08-09 ENCOUNTER — Ambulatory Visit: Payer: Medicare PPO | Admitting: Student in an Organized Health Care Education/Training Program

## 2022-08-09 NOTE — Progress Notes (Signed)
Sent message, via epic in basket, requesting orders in epic from surgeon.  

## 2022-08-14 DIAGNOSIS — M1712 Unilateral primary osteoarthritis, left knee: Secondary | ICD-10-CM | POA: Diagnosis not present

## 2022-08-14 NOTE — Progress Notes (Signed)
Anesthesia Review:  PCP: Cardiologist : Edd Fabian, NP LOV 05/04/22  Chest x-ray : EKG : 09/16/21  Echo : 2020  CT cors- 2020  Stress test: Cardiac Cath :  Activity level:  Sleep Study/ CPAP : Fasting Blood Sugar :      / Checks Blood Sugar -- times a day:   Blood Thinner/ Instructions /Last Dose: ASA / Instructions/ Last Dose :

## 2022-08-15 DIAGNOSIS — L603 Nail dystrophy: Secondary | ICD-10-CM | POA: Diagnosis not present

## 2022-08-15 DIAGNOSIS — M76822 Posterior tibial tendinitis, left leg: Secondary | ICD-10-CM | POA: Diagnosis not present

## 2022-08-15 DIAGNOSIS — M19072 Primary osteoarthritis, left ankle and foot: Secondary | ICD-10-CM | POA: Diagnosis not present

## 2022-08-15 DIAGNOSIS — B351 Tinea unguium: Secondary | ICD-10-CM | POA: Diagnosis not present

## 2022-08-15 DIAGNOSIS — M2142 Flat foot [pes planus] (acquired), left foot: Secondary | ICD-10-CM | POA: Diagnosis not present

## 2022-08-15 DIAGNOSIS — R2689 Other abnormalities of gait and mobility: Secondary | ICD-10-CM | POA: Diagnosis not present

## 2022-08-15 DIAGNOSIS — M19071 Primary osteoarthritis, right ankle and foot: Secondary | ICD-10-CM | POA: Diagnosis not present

## 2022-08-15 NOTE — Patient Instructions (Signed)
SURGICAL WAITING ROOM VISITATION Patients having surgery or a procedure may have no more than 2 support people in the waiting area - these visitors may rotate.   Children under the age of 1 must have an adult with them who is not the patient. If the patient needs to stay at the hospital during part of their recovery, the visitor guidelines for inpatient rooms apply. Pre-op nurse will coordinate an appropriate time for 1 support person to accompany patient in pre-op.  This support person may not rotate.    Please refer to the [facility] website for the visitor guidelines for Inpatients (after your surgery is over and you are in a regular room).       Your procedure is scheduled on:  08/29/2022    Report to [facility] Main Entrance    Report to admitting at   [phone]   Call this number if you have problems the morning of surgery [phone]   Do not eat food :After Midnight.   After Midnight you may have the following liquids until ___ 0430___ AM  DAY OF SURGERY  Water Non-Citrus Juices (without pulp, NO RED) Carbonated Beverages Black Coffee (NO MILK/CREAM OR CREAMERS, sugar ok)  Clear Tea (NO MILK/CREAM OR CREAMERS, sugar ok) regular and decaf                             Plain Jell-O (NO RED)                                           Fruit ices (not with fruit pulp, NO RED)                                     Popsicles (NO RED)                                                               Sports drinks like Gatorade (NO RED)                    The day of surgery:  Drink ONE (1) Pre-Surgery Clear Ensure or G2 at 0430  AM  ( have completed by ) the morning of surgery. Drink in one sitting. Do not sip.  This drink was given to you during your hospital  pre-op appointment visit. Nothing else to drink after completing the  Pre-Surgery Clear Ensure or G2.         If you have questions, please contact your surgeon's office.  URGEON'S OFFICE!!!     Oral Hygiene is also  important to reduce your risk of infection.                                    Remember - BRUSH YOUR TEETH THE MORNING OF SURGERY WITH YOUR REGULAR TOOTHPASTE   Do NOT smoke after Midnight   Take these medicines the morning of surgery with A SIP OF WATER: allegra, flonase gabapentin, omeprazole  DO NOT TAKE ANY ORAL DIABETIC MEDICATIONS DAY OF YOUR SURGERY  Bring CPAP mask and tubing day of surgery.                              You may not have any metal on your body including hair pins, jewelry, and body piercing             Do not wear make-up, lotions, powders, perfumes/cologne, or deodorant  Do not wear nail polish including gel and S&S, artificial/acrylic nails, or any other type of covering on natural nails including finger and toenails. If you have artificial nails, gel coating, etc. that needs to be removed by a nail salon please have this removed prior to surgery or surgery may need to be canceled/ delayed if the surgeon/ anesthesia feels like they are unable to be safely monitored.   Do not shave  48 hours prior to surgery.               Men may shave face and neck.   Do not bring valuables to the hospital. [place].   Contacts, dentures or bridgework may not be worn into surgery.   Bring small overnight bag day of surgery.   DO NOT [place]. PHARMACY WILL DISPENSE MEDICATIONS LISTED ON YOUR MEDICATION LIST TO YOU DURING YOUR ADMISSION [place]!    Patients discharged on the day of surgery will not be allowed to drive home.  Someone NEEDS to stay with you for the first 24 hours after anesthesia.   Special Instructions: Bring a copy of your healthcare power of attorney and living will documents         the day of surgery if you haven't scanned them before.              Please read over the following fact sheets you were given: IF YOU HAVE QUESTIONS ABOUT YOUR PRE-OP INSTRUCTIONS  PLEASE CALL [phone]     [facility] - Preparing for Surgery Before surgery, you can play an important role.  Because skin is not sterile, your skin needs to be as free of germs as possible.  You can reduce the number of germs on your skin by washing with CHG (chlorahexidine gluconate) soap before surgery.  CHG is an antiseptic cleaner which kills germs and bonds with the skin to continue killing germs even after washing. Please DO NOT use if you have an allergy to CHG or antibacterial soaps.  If your skin becomes reddened/irritated stop using the CHG and inform your nurse when you arrive at Short Stay. Do not shave (including legs and underarms) for at least 48 hours prior to the first CHG shower.  You may shave your face/neck. Please follow these instructions carefully:  1.  Shower with CHG Soap the night before surgery and the  morning of Surgery.  2.  If you choose to wash your hair, wash your hair first as usual with your  normal  shampoo.  3.  After you shampoo, rinse your hair and body thoroughly to remove the  shampoo.                           4.  Use CHG as you would any other liquid soap.  You can apply chg directly  to the skin and wash                       Gently with a scrungie or clean washcloth.  5.  Apply the CHG Soap to your body ONLY FROM THE NECK DOWN.   Do not use on face/ open                           Wound or open sores. Avoid contact with eyes, ears mouth and genitals (private parts).                       Wash face,  Genitals (private parts) with your normal soap.             6.  Wash thoroughly, paying special attention to the area where your surgery  will be performed.  7.  Thoroughly rinse your body with warm water from the neck down.  8.  DO NOT shower/wash with your normal soap after using and rinsing off  the CHG Soap.                9.  Pat yourself dry with a clean towel.            10.  Wear clean pajamas.            11.  Place clean sheets on your bed the  night of your first shower and do not  sleep with pets. Day of Surgery : Do not apply any lotions/deodorants the morning of surgery.  Please wear clean clothes to the hospital/surgery center.  FAILURE TO FOLLOW THESE INSTRUCTIONS MAY RESULT IN THE CANCELLATION OF YOUR SURGERY PATIENT SIGNATURE_________________________________  NURSE SIGNATURE__________________________________  ________________________________________________________________________

## 2022-08-16 ENCOUNTER — Encounter (HOSPITAL_COMMUNITY)
Admission: RE | Admit: 2022-08-16 | Discharge: 2022-08-16 | Disposition: A | Discharge status: Home / Self Care | Payer: Medicare PPO | Source: Ambulatory Visit | Attending: Orthopedic Surgery | Admitting: Orthopedic Surgery

## 2022-08-16 ENCOUNTER — Other Ambulatory Visit: Payer: Self-pay

## 2022-08-16 ENCOUNTER — Encounter (HOSPITAL_COMMUNITY): Payer: Self-pay

## 2022-08-16 VITALS — BP 132/73 | HR 66 | Temp 97.6°F | Resp 16

## 2022-08-16 DIAGNOSIS — I7121 Aneurysm of the ascending aorta, without rupture: Secondary | ICD-10-CM | POA: Diagnosis not present

## 2022-08-16 DIAGNOSIS — G4733 Obstructive sleep apnea (adult) (pediatric): Secondary | ICD-10-CM | POA: Diagnosis not present

## 2022-08-16 DIAGNOSIS — M1712 Unilateral primary osteoarthritis, left knee: Secondary | ICD-10-CM | POA: Insufficient documentation

## 2022-08-16 DIAGNOSIS — Z01818 Encounter for other preprocedural examination: Secondary | ICD-10-CM

## 2022-08-16 DIAGNOSIS — I2510 Atherosclerotic heart disease of native coronary artery without angina pectoris: Secondary | ICD-10-CM | POA: Diagnosis not present

## 2022-08-16 LAB — CBC
HCT: 45.1 % (ref 39.0–52.0)
Hemoglobin: 15.3 g/dL (ref 13.0–17.0)
MCH: 31.1 pg (ref 26.0–34.0)
MCHC: 33.9 g/dL (ref 30.0–36.0)
MCV: 91.7 fL (ref 80.0–100.0)
Platelets: 221 10*3/uL (ref 150–400)
RBC: 4.92 MIL/uL (ref 4.22–5.81)
RDW: 12.7 % (ref 11.5–15.5)
WBC: 5.7 10*3/uL (ref 4.0–10.5)
nRBC: 0 % (ref 0.0–0.2)

## 2022-08-16 LAB — SURGICAL PCR SCREEN
MRSA, PCR: NEGATIVE
Staphylococcus aureus: NEGATIVE

## 2022-08-16 NOTE — H&P (Signed)
KNEE ARTHROPLASTY ADMISSION H&P  Patient ID: Patrick Moyer MRN: [id number] DOB/AGE: July 03, 1951 71 y.o.  Chief Complaint: left knee pain.  Planned Procedure Date: 08/29/22 Medical Clearance by Dr. Tenny Craw   Cardiac Clearance by Edd Fabian, NP   HPI: Patrick Moyer is a 71 y.o. adult who presents for evaluation of djd left knee. The patient has a history of pain and functional disability in the left knee due to arthritis and has failed non-surgical conservative treatments for greater than 12 weeks to include NSAID's and/or analgesics and activity modification.  Onset of symptoms was gradual, starting 1 years ago with rapidlly worsening course since that time. The patient noted prior procedures on the knee to include  arthroscopy and menisectomy on the left knee.  Patient currently rates pain at 1 out of 10 with activity. Patient has worsening of pain with activity and weight bearing and pain that interferes with activities of daily living.  Patient has evidence of joint space narrowing by imaging studies.  There is no active infection.  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 43   per pt treated with INH for one year and cxr's since have been normal   HSV-1 infection    Marfanoid habitus    per pt has wing span is longer than his height, effects eyes (iris), hyperflexibility of ankle and hands,  AAA   Mild CAD    cardiologist--  dr Delton See---  CTA 10-22-2019   Non-celiac gluten sensitivity     Numbness and tingling in both hands    per pt intermittantly due to cervical spine   OSA on CPAP    auto 8-10,  pt stated uses every night   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)   Pulmonary nodules    per pt followed by pcp (CTA and CT chest 10-22-2019 , stable)   Thoracic ascending aortic aneurysm [facility])    cardiologist--- dr Eloy End--- CTA 10-22-2019  ascending aorta 52mm and aortic root 60mm   Wears glasses    Past Surgical History:  Procedure Laterality Date   FINGER EXPLORATION Left 1995   left middle finger digital nerve repair   INGUINAL HERNIA REPAIR Left 1978   LAPAROSCOPIC APPENDECTOMY  2010   left knee arthroscopy      NASAL SEPTUM SURGERY  1975   TRANSURETHRAL RESECTION OF PROSTATE N/A 05/05/2020   Procedure: TRANSURETHRAL RESECTION OF THE PROSTATE (TURP), BIPOLAR;  Surgeon: Rene Paci, MD;  Location: [facility];  Service: Urology;  Laterality: N/A;   WISDOM TOOTH EXTRACTION  1975   Allergies  Allergen Reactions   Other     Adhesive, Caffene-abd pain   Cialis [Tadalafil]     Caused acute angle glaucoma   Crestor [Rosuvastatin] Other (See Comments)    Causes joint aches   Soy Protein [Soybean Oil]  Gastrointestinal upset   Viagra [Sildenafil]     Caused acute angle glaucoma   Prior to Admission medications   Medication Sig Start Date End Date Taking? Authorizing Provider  acetaminophen (TYLENOL) 500 MG tablet Take 500 mg by mouth at bedtime.   Yes [provider]  Ascorbic Acid (VITAMIN C) 1000 MG tablet Take 1,000 mg by mouth daily.   Yes [provider]  B Complex Vitamins (B COMPLEX 100 PO) Take 1 tablet by mouth in the morning and at bedtime.   Yes [provider]  Ca Carbonate-Mag Hydroxide (ROLAIDS PO) Take by mouth as needed.   Yes [provider]  celecoxib (CELEBREX) 200 MG capsule Take 200 mg by mouth 2 (two) times daily.   Yes  [provider]  cholecalciferol (VITAMIN D) 1000 UNITS tablet Take 1,000 Units by mouth in the morning and at bedtime.   Yes [provider]  diclofenac Sodium (VOLTAREN) 1 % GEL Apply 1 Application topically 3 (three) times daily as needed (pain).   Yes [provider]  fexofenadine (ALLEGRA) 180 MG tablet Take 180 mg by mouth daily. 05/17/21  Yes [provider]  FIBER PO Take 1 each by mouth daily.   Yes [provider]  fluticasone (FLONASE) 50 MCG/ACT nasal spray Place 1 spray into both nostrils in the morning and at bedtime.   Yes [provider]  folic acid (FOLVITE) 1 MG tablet Take 1 mg by mouth daily.   Yes [provider]  gabapentin (NEURONTIN) 600 MG tablet Take 900 mg by mouth 3 (three) times daily.   Yes [provider]  glucosamine-chondroitin 500-400 MG tablet Take 1 tablet by mouth daily.   Yes [provider]  guaiFENesin (MUCINEX) 600 MG 12 hr tablet Take 1,200 mg by mouth 2 (two) times daily.   Yes [provider]  L-Lysine 500 MG CAPS Take 1 capsule by mouth in the morning and at bedtime.   Yes [provider]  Lutein-Zeaxanthin 25-5 MG CAPS Take 1 tablet by mouth daily. 05/01/22  Yes Cleaver, Thomasene Ripple, NP  montelukast (SINGULAIR) 10 MG tablet Take 10 mg by mouth at bedtime.    Yes [provider]  Omega-3 Fatty Acids (FISH OIL) 1000 MG CAPS Take 1 capsule (1,000 mg total) by mouth in the morning and at bedtime. Patient taking differently: Take 1 capsule by mouth at bedtime. 05/01/22 05/02/23 Yes Cleaver, Thomasene Ripple, NP  omeprazole (PRILOSEC) 20 MG capsule Take 20 mg by mouth daily.   Yes [provider]  Potassium Gluconate 550 (90 K) MG TABS Take 1 tablet by mouth as directed. 5 tablets weekly. Patient taking differently: Take 1-2 tablets by mouth daily as needed (cramps). 05/01/22  Yes Ronney Asters, NP  rosuvastatin (CRESTOR) 5 MG tablet Take 1 tablet by mouth 2-3  times per week Patient taking differently: Take 5 mg by mouth every Monday, Wednesday, and Friday. 01/25/22  Yes Meriam Sprague, MD  SODIUM FLUORIDE 5000 PPM 1.1 % GEL dental gel Take 1 application by mouth daily. 02/01/22  Yes [provider]  Turmeric 500 MG CAPS Take 500 mg by mouth daily.   Yes [provider]  valACYclovir (VALTREX) 1000 MG tablet Take 1,000 mg by mouth daily as needed (cold sore).   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    Employer: [employer]  Tobacco Use   Smoking status: Never   Smokeless tobacco: Never  Vaping Use   Vaping Use: Never used  Substance and Sexual Activity   Alcohol use: No   Drug use: Never   Sexual activity: Not on file  Other Topics Concern   Not on file  Social History Narrative   Not on file   Social Determinants of Health   Financial Resource Strain: Not on file  Food Insecurity: Not on file  Transportation Needs: Not on file  Physical Activity: Not on file  Stress: Not on file  Social Connections: Not on file   Family History  Problem Relation Age of Onset   Aortic aneurysm Father        abdominal   Hypertension Father    CAD Father    Ovarian cancer Mother        cysto adenocarcenoma   Osteoarthritis Sister    Glaucoma Brother    Bipolar disorder Brother    Marfan syndrome Brother    Obesity Brother     ROS: Currently denies lightheadedness, dizziness, Fever, chills, CP, SOB.   No personal history of DVT, PE, MI, or CVA. No loose teeth or dentures All other systems have been reviewed and were otherwise currently negative with the exception of those mentioned in the HPI and as above.  Objective: Vitals: Ht: 6\' 2"  Wt: 202 lbs Temp: 97.3 BP: 138/77 Pulse: 63  O2 96% on room air.   Physical Exam: General: Alert, NAD.  Antalgic Gait  HEENT: EOMI, Good Neck Extension  Pulm: No increased work of breathing.  Clear B/L A/P w/o crackle or wheeze.  CV: RRR, No m/g/r appreciated  GI: soft, NT, ND Neuro: Neuro without gross focal deficit.  Sensation intact distally Skin: No lesions in the area of chief complaint MSK/Surgical Site: left knee w/o redness or effusion.  No JLT. ROM 0-120.  5/5 strength in extension and flexion.  +EHL/FHL.  NVI.  Stable varus and valgus stress.    Imaging Review Plain radiographs as well as arthroscopic images demonstrate severe degenerative joint disease of the left knee.   The overall alignment ismild varus. The bone quality appears to be adequate for age and reported activity level.  Preoperative templating of the joint replacement has been completed, documented, and submitted to the Operating Room personnel in order to optimize intra-operative equipment management.  Assessment: djd anteromedial left knee    Plan: Plan for Procedure(s): TOTAL UNICOMPARTMENTAL KNEE ARTHROPLASTY  The patient history, physical exam, clinical judgement of the provider and imaging are consistent with end stage degenerative joint disease and unicompartmental  joint arthroplasty is deemed medically necessary. The treatment options including medical management, injection therapy, and arthroplasty were discussed at length. The risks and benefits of Procedure(s): TOTAL UNICOMPARTMENTAL ARTHROPLASTY were presented and reviewed.  The risks of nonoperative treatment, versus surgical intervention including but not limited to continued pain, aseptic loosening, stiffness, dislocation/subluxation, infection, bleeding, nerve injury, blood clots, cardiopulmonary complications, morbidity, mortality, among others were discussed. The patient verbalizes understanding and wishes to proceed with the plan.  Patient is being admitted for inpatient treatment for  surgery, pain control, PT, prophylactic antibiotics, VTE prophylaxis, progressive ambulation, ADL's and discharge planning.   Dental prophylaxis discussed and recommended for 1 years postoperatively.  The patient does  meet the criteria for TXA  which will be used perioperatively.   ASA 325 mg will be used postoperatively for DVT prophylaxis in addition to SCDs, and early ambulation. The patient is planning to be discharged home with OPPT in care of family   Jola Baptist 08/16/2022 1:31 PM

## 2022-08-17 NOTE — Anesthesia Preprocedure Evaluation (Addendum)
Anesthesia Evaluation  Patient identified by MRN, date of birth, ID band Patient awake    Reviewed: Allergy & Precautions, NPO status , Patient's Chart, lab work & pertinent test results  Airway Mallampati: II  TM Distance: >3 FB Neck ROM: Full    Dental no notable dental hx.    Pulmonary sleep apnea and Continuous Positive Airway Pressure Ventilation ,    Pulmonary exam normal breath sounds clear to auscultation       Cardiovascular + Peripheral Vascular Disease  Normal cardiovascular exam Rhythm:Regular Rate:Normal     Neuro/Psych negative neurological ROS  negative psych ROS   GI/Hepatic Neg liver ROS, GERD  Medicated,  Endo/Other  negative endocrine ROS  Renal/GU negative Renal ROS  negative genitourinary   Musculoskeletal  (+) Arthritis , Osteoarthritis,    Abdominal   Peds negative pediatric ROS (+)  Hematology negative hematology ROS (+)   Anesthesia Other Findings   Reproductive/Obstetrics negative OB ROS                             Anesthesia Physical Anesthesia Plan  ASA: 2  Anesthesia Plan: Spinal   Post-op Pain Management: Regional block*   Induction: Intravenous  PONV Risk Score and Plan: 1 and Propofol infusion and Treatment may vary due to age or medical condition  Airway Management Planned: Simple Face Mask  Additional Equipment:   Intra-op Plan:   Post-operative Plan:   Informed Consent: I have reviewed the patients History and Physical, chart, labs and discussed the procedure including the risks, benefits and alternatives for the proposed anesthesia with the patient or authorized representative who has indicated his/her understanding and acceptance.     Dental advisory given  Plan Discussed with: CRNA and Surgeon  Anesthesia Plan Comments: (See PAT note 08/16/2022)       Anesthesia Quick Evaluation

## 2022-08-17 NOTE — Progress Notes (Signed)
[place] Phone: [phone] Subjective:   Fontaine No, am serving as a scribe for Dr. Hulan Saas.  I'm seeing this patient by the request  of:  Lawerance Cruel, MD  CC: low back pain follow up   [id number]  05/15/2022 Discussed with patient again at great length.  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  Patient will be traveling for the next 9 weeks and did not feel this is a time to make a significant change.  We discussed with patient about continuing different exercises and trying to incorporate some more of the strength training.  Follow-up with me again in 3 months to further evaluate.  Total time reviewing outside charts including most recent injection and discussing with patient about different treatment options 33 minutes  Update 08/21/2022 AYCEN BLEEKER is a 71 y.o. adult coming in with complaint of back and hip pain. Patient states that he is doing well. He was off of work for 6 weeks and this helped his L leg pain. Was able to hike up hill without pain.     Patient is scheduled for a unilateral compartment knee surgery in September of this year.  Since we have seen patient as well patient has been seen by podiatry as well as pain medicine.    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 24    per pt treated with INH for one year and cxr's since have been normal   HSV-1 infection    Marfanoid habitus    per pt has wing span is longer than his height, effects eyes (iris), hyperflexibility of ankle and hands,  AAA   Mild CAD    cardiologist--  dr Meda Coffee---  CTA 10-22-2019   Non-celiac gluten sensitivity    Numbness and tingling in both hands    per pt intermittantly due to cervical spine   OSA on CPAP    auto 8-10,  pt stated uses every night   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)   Pulmonary nodules    per pt followed by pcp (CTA and CT chest 10-22-2019 , stable)   Thoracic ascending aortic aneurysm [facility])    cardiologist--- dr Liane Comber--- CTA 10-22-2019  ascending aorta 42mm and aortic root 46mm   Wears glasses    Past Surgical History:  Procedure Laterality Date   FINGER EXPLORATION Left 1995   left middle finger digital nerve repair   INGUINAL HERNIA REPAIR Left 1978   LAPAROSCOPIC APPENDECTOMY  2010   left knee arthroscopy      NASAL SEPTUM SURGERY  1975   TRANSURETHRAL RESECTION OF PROSTATE N/A 05/05/2020   Procedure: TRANSURETHRAL RESECTION OF THE PROSTATE (TURP), BIPOLAR;  Surgeon: Ceasar Mons, MD;  Location:  [facility];  Service: Urology;  Laterality: N/A;   WISDOM TOOTH EXTRACTION  1975   Social History   Socioeconomic History   Marital status: Married    Spouse name: Not on file   Number of children: 2   Years of education: Not on file   Highest education level: Not on file  Occupational History   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    Employer:   Tobacco Use   Smoking status: Never   Smokeless tobacco: Never  Vaping Use   Vaping Use: Never used  Substance and Sexual Activity   Alcohol use: No   Drug use: Never   Sexual activity: Not on file  Other Topics Concern   Not on file  Social History Narrative   Not on file   Social Determinants of Health    Financial Resource Strain: Not on file  Food Insecurity: Not on file  Transportation Needs: Not on file  Physical Activity: Not on file  Stress: Not on file  Social Connections: Not on file   Allergies  Allergen Reactions   Other     Adhesive, Caffene-abd pain   Cialis [Tadalafil]     Caused acute angle glaucoma   Crestor [Rosuvastatin] Other (See Comments)    Causes joint aches   Soy Protein [Soybean Oil]     Gastrointestinal upset   Viagra [Sildenafil]     Caused acute angle glaucoma   Family History  Problem Relation Age of Onset   Aortic aneurysm Father        abdominal   Hypertension Father    CAD Father    Ovarian cancer Mother        cysto adenocarcenoma   Osteoarthritis Sister    Glaucoma Brother    Bipolar disorder Brother    Marfan syndrome Brother    Obesity Brother      Current Outpatient Medications (Cardiovascular):    rosuvastatin (CRESTOR) 5 MG tablet, Take 1 tablet by mouth 2-3 times per week (Patient taking differently: Take 5 mg by mouth every Monday, Wednesday, and Friday.)  Current Outpatient Medications (Respiratory):    fexofenadine (ALLEGRA) 180 MG tablet, Take 180 mg by mouth daily.   fluticasone (FLONASE) 50 MCG/ACT nasal spray, Place 1 spray into both nostrils in the morning and at bedtime.   guaiFENesin (MUCINEX) 600 MG 12 hr tablet, Take 1,200 mg by mouth 2 (two) times daily.   montelukast (SINGULAIR) 10 MG tablet, Take 10 mg by mouth at bedtime.   Current Outpatient Medications (Analgesics):    acetaminophen (TYLENOL) 500 MG tablet, Take 500 mg by mouth at bedtime.   celecoxib (CELEBREX) 200 MG capsule, Take 200 mg by mouth 2 (two) times daily.  Current Outpatient Medications (Hematological):    folic acid (FOLVITE) 1 MG tablet, Take 1 mg by mouth daily.  Current Outpatient Medications (Other):    Ascorbic Acid (VITAMIN C) 1000 MG tablet, Take 1,000 mg by mouth daily.   B Complex Vitamins (B COMPLEX 100 PO), Take 1 tablet by  mouth in the morning and at bedtime.   Ca Carbonate-Mag Hydroxide (ROLAIDS PO), Take by mouth as needed.   cholecalciferol (VITAMIN D) 1000 UNITS tablet, Take 1,000 Units by mouth in the morning and at bedtime.   diclofenac Sodium (VOLTAREN) 1 % GEL, Apply 1 Application topically 3 (three) times daily as needed (pain).   FIBER PO, Take 1 each by mouth daily.   gabapentin (NEURONTIN) 600 MG tablet, Take 900 mg by mouth 3 (  three) times daily.   glucosamine-chondroitin 500-400 MG tablet, Take 1 tablet by mouth daily.   L-Lysine 500 MG CAPS, Take 1 capsule by mouth in the morning and at bedtime.   Lutein-Zeaxanthin 25-5 MG CAPS, Take 1 tablet by mouth daily.   Omega-3 Fatty Acids (FISH OIL) 1000 MG CAPS, Take 1 capsule (1,000 mg total) by mouth in the morning and at bedtime. (Patient taking differently: Take 1 capsule by mouth at bedtime.)   omeprazole (PRILOSEC) 20 MG capsule, Take 20 mg by mouth daily.   Potassium Gluconate 550 (90 K) MG TABS, Take 1 tablet by mouth as directed. 5 tablets weekly. (Patient taking differently: Take 1-2 tablets by mouth daily as needed (cramps).)   SODIUM FLUORIDE 5000 PPM 1.1 % GEL dental gel, Take 1 application by mouth daily.   Turmeric 500 MG CAPS, Take 500 mg by mouth daily.   valACYclovir (VALTREX) 1000 MG tablet, Take 1,000 mg by mouth daily as needed (cold sore).   Reviewed prior external information including notes and imaging from  primary care provider As well as notes that were available from care everywhere and other healthcare systems.  Past medical history, social, surgical and family history all reviewed in electronic medical record.  No pertanent information unless stated regarding to the chief complaint.   Review of Systems:  No headache, visual changes, nausea, vomiting, diarrhea, constipation, dizziness, abdominal pain, skin rash, fevers, chills, night sweats, weight loss, swollen lymph nodes, body aches, joint swelling, chest pain, shortness  of breath, mood changes. POSITIVE muscle aches  Objective  Blood pressure 116/80, pulse 62, height 6\' 3"  (1.905 m), weight 204 lb (92.5 kg), SpO2 98 %.   General: No apparent distress alert and oriented x3 mood and affect normal, dressed appropriately.  HEENT: Pupils equal, extraocular movements intact  Respiratory: Patient's speak in full sentences and does not appear short of breath  Cardiovascular: No lower extremity edema, non tender, no erythema  Hypermobility of multiple joints.  Patient does have severe overpronation of the ankles bilaterally left greater than right.  Patient has limited range of motion of the wrist bilaterally secondary to arthritic changes noted.  Patient does have hypermobility of some of the tendons.    Impression and Recommendations:    The above documentation has been reviewed and is accurate and complete , DO

## 2022-08-17 NOTE — Progress Notes (Signed)
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 Teryl Lucy.   Pt seen by cardiology 05/05/2022 for preoperative evaluation.  Per OV note, "Chart reviewed as part of pre-operative protocol coverage. Given past medical history and time since last visit, based on ACC/AHA guidelines, Patrick Moyer would be at acceptable risk for the planned procedure without further cardiovascular testing.    Patient was advised that if he develops new symptoms prior to surgery to contact our office to arrange a follow-up appointment.  He verbalized understanding.   His RCRI is a class II risk, 0.9% risk of major cardiac event.  He is able to complete greater than 4 METS of physical activity."  Anticipate pt can proceed with planned procedure barring acute status change.   VS: BP 132/73   Pulse 66   Temp 36.4 C (Oral)   Resp 16   SpO2 98%   PROVIDERS: Daisy Floro, MD is PCP   Cardiologist :  Laurance Flatten LABS: Labs reviewed: Acceptable for surgery. (all labs ordered are listed, but only abnormal results are displayed)  Labs Reviewed  SURGICAL PCR SCREEN  CBC     IMAGES: CT Angio 01/09/2022 IMPRESSION: 1. Stable uncomplicated mild fusiform aneurysmal dilatation of the ascending thoracic aorta measuring 42 mm in diameter, unchanged compared to the 04/2015 examination. Aortic aneurysm NOS (ICD10-I71.9). 2. Coronary artery calcifications. Aortic Atherosclerosis (ICD10-I70.0).  EKG:   CV: Echo 09/10/2019 1. The left ventricle has normal systolic function with an ejection  fraction  of 60-65%. The cavity size was normal. There is mild asymmetric  left ventricular hypertrophy. Left ventricular diastolic Doppler  parameters are consistent with impaired  relaxation. No evidence of left ventricular regional wall motion  abnormalities.   2. Mild Doppler evident acceleration of flow around distal outflow tract  septal hypertrophy (may result in murmur).   3. The right ventricle has normal systolic function. The cavity was  normal. There is No increase in right ventricular wall thickness.   4. Left atrial size was normal.   5. Right atrial size was normal.   6. No evidence of mitral valve stenosis.   7. The aortic valve is tricuspid. Mild thickening of the aortic valve.  Mild calcification of the aortic valve. Aortic valve regurgitation was not  visualized by color flow Doppler.   8. The pulmonic valve was grossly normal. Pulmonic valve regurgitation is  not visualized by color flow Doppler.   9. The aorta is abnormal unless otherwise noted.  10. There is dilatation of the ascending aorta measuring 40 mm.  11. The average left ventricular global longitudinal strain is -22.7 %. Past Medical History:  Diagnosis Date   ALLERGIC RHINITIS    Arthritis pain of wrist    Benign localized prostatic hyperplasia with lower urinary tract symptoms (LUTS)    Diverticulosis    ED (erectile dysfunction)    Environmental and seasonal allergies    Family history of Marfan syndrome    father, paternal uncle, and brother   GERD (gastroesophageal reflux disease)    High cholesterol  History of closed head injury    04-28-2020 per pt yrs ago w/ no loc, residual mild memory loss which has resolved   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 15   per pt treated with INH for one year and cxr's since have been normal   HSV-1 infection    Marfanoid habitus    per pt has wing span is longer than his height, effects eyes (iris), hyperflexibility of ankle and hands,  AAA   Mild CAD    cardiologist--  dr  Delton See---  CTA 10-22-2019   Non-celiac gluten sensitivity    Numbness and tingling in both hands    per pt intermittantly due to cervical spine   OSA on CPAP    auto 8-10,  pt stated uses every night   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)   Pulmonary nodules    per pt followed by pcp (CTA and CT chest 10-22-2019 , stable)   Thoracic ascending aortic aneurysm [facility])    cardiologist--- dr Eloy End--- CTA 10-22-2019  ascending aorta 69mm and aortic root 66mm   Wears glasses     Past Surgical History:  Procedure Laterality Date   FINGER EXPLORATION Left 1995   left middle finger digital nerve repair   INGUINAL HERNIA REPAIR Left 1978   LAPAROSCOPIC APPENDECTOMY  2010   left knee arthroscopy      NASAL SEPTUM SURGERY  1975   TRANSURETHRAL RESECTION OF PROSTATE N/A 05/05/2020   Procedure: TRANSURETHRAL RESECTION OF THE PROSTATE (TURP), BIPOLAR;  Surgeon: Rene Paci, MD;  Location: [facility];  Service: Urology;  Laterality: N/A;   WISDOM TOOTH EXTRACTION  1975    MEDICATIONS:  acetaminophen (TYLENOL) 500 MG tablet   Ascorbic Acid (VITAMIN C) 1000 MG tablet   B Complex Vitamins (B COMPLEX 100 PO)   Ca Carbonate-Mag Hydroxide (ROLAIDS PO)   celecoxib (CELEBREX) 200 MG capsule   cholecalciferol (VITAMIN D) 1000 UNITS tablet   diclofenac Sodium (VOLTAREN) 1 % GEL   fexofenadine (ALLEGRA) 180 MG tablet   FIBER PO   fluticasone (FLONASE) 50 MCG/ACT nasal spray   folic acid (FOLVITE) 1 MG tablet   gabapentin (NEURONTIN) 600 MG tablet   glucosamine-chondroitin 500-400 MG tablet   guaiFENesin (MUCINEX) 600 MG 12 hr tablet   L-Lysine 500 MG CAPS   Lutein-Zeaxanthin 25-5 MG CAPS   montelukast (SINGULAIR) 10 MG tablet   Omega-3 Fatty Acids (FISH OIL) 1000 MG CAPS   omeprazole (PRILOSEC) 20 MG capsule   Potassium Gluconate 550 (90 K) MG TABS   rosuvastatin (CRESTOR) 5 MG tablet   SODIUM FLUORIDE 5000 PPM  1.1 % GEL dental gel   Turmeric 500 MG CAPS   valACYclovir (VALTREX) 1000 MG tablet   No current facility-administered medications for this encounter.    Patrick Cipro Ward, PA-C [facility] [phone]

## 2022-08-21 ENCOUNTER — Encounter: Payer: Self-pay | Admitting: Family Medicine

## 2022-08-21 ENCOUNTER — Ambulatory Visit: Payer: Medicare PPO | Admitting: Family Medicine

## 2022-08-21 ENCOUNTER — Telehealth: Payer: Self-pay | Admitting: Family Medicine

## 2022-08-21 VITALS — BP 116/80 | HR 62 | Ht 75.0 in | Wt 204.0 lb

## 2022-08-21 DIAGNOSIS — G894 Chronic pain syndrome: Secondary | ICD-10-CM | POA: Diagnosis not present

## 2022-08-21 DIAGNOSIS — M25532 Pain in left wrist: Secondary | ICD-10-CM

## 2022-08-21 DIAGNOSIS — M357 Hypermobility syndrome: Secondary | ICD-10-CM | POA: Diagnosis not present

## 2022-08-21 NOTE — Patient Instructions (Signed)
See me in 3 months Referral sent to [facility]

## 2022-08-21 NOTE — Assessment & Plan Note (Signed)
Patient has made great improvement already at this point.  We considered the Cymbalta and the osteopathic manipulation but patient is doing well with the conservative therapy.  We will continue to consider this if necessary.  Patient has noticed that decreasing some of his other activity as well is being more active though has helped overall.  I do not think we need to make any other large changes and follow-up again in 3 months.

## 2022-08-21 NOTE — Assessment & Plan Note (Signed)
Given brace on the wrist again.  Hypermobility, hopefully help with swimming Discussed with patient about physical therapy which patient was encouraged to do and referral placed today as well.  Follow-up with me again in 3 months

## 2022-08-21 NOTE — Care Plan (Signed)
Ortho Bundle Case Management Note  Patient Details  Name: Patrick Moyer MRN: [id number] Date of Birth: 28-Oct-1951   Spoke with patient prior to surgery. He will discharge to home with family support. Rolling [place] ordered for home. OPPT set up currently with [facility]. [facility] is not in network with his insurance. Choice offered                   DME Arranged:  [place] rolling DME Agency:  [employer]  HH Arranged:    HH Agency:     Additional Comments: Please contact me with any questions of if this plan should need to change.  Shauna Hugh,  RN,BSN,MHA,CCM  [facility]  [phone] 08/21/2022, 4:34 PM

## 2022-08-21 NOTE — Discharge Instructions (Addendum)

## 2022-08-21 NOTE — Telephone Encounter (Signed)
[facility] called, they are out of network with patient's [facility].

## 2022-08-23 NOTE — Telephone Encounter (Signed)
Left message for patient to call us back to let us know who he would like to be referred to for PT.

## 2022-08-29 ENCOUNTER — Ambulatory Visit (HOSPITAL_COMMUNITY): Payer: Medicare PPO | Admitting: Physician Assistant

## 2022-08-29 ENCOUNTER — Ambulatory Visit (HOSPITAL_BASED_OUTPATIENT_CLINIC_OR_DEPARTMENT_OTHER): Payer: Medicare PPO | Admitting: Certified Registered Nurse Anesthetist

## 2022-08-29 ENCOUNTER — Encounter (HOSPITAL_COMMUNITY)
Admission: RE | Disposition: A | Discharge status: Home / Self Care | Payer: Self-pay | Source: Ambulatory Visit | Attending: Orthopedic Surgery

## 2022-08-29 ENCOUNTER — Other Ambulatory Visit: Payer: Self-pay

## 2022-08-29 ENCOUNTER — Encounter (HOSPITAL_COMMUNITY): Payer: Self-pay | Admitting: Orthopedic Surgery

## 2022-08-29 ENCOUNTER — Ambulatory Visit (HOSPITAL_COMMUNITY): Payer: Medicare PPO

## 2022-08-29 ENCOUNTER — Observation Stay (HOSPITAL_COMMUNITY)
Admission: RE | Admit: 2022-08-29 | Discharge: 2022-08-30 | Disposition: A | Discharge status: Home / Self Care | Payer: Medicare PPO | Source: Ambulatory Visit | Attending: Orthopedic Surgery | Admitting: Orthopedic Surgery

## 2022-08-29 DIAGNOSIS — Z01818 Encounter for other preprocedural examination: Principal | ICD-10-CM

## 2022-08-29 DIAGNOSIS — Z96652 Presence of left artificial knee joint: Secondary | ICD-10-CM | POA: Diagnosis not present

## 2022-08-29 DIAGNOSIS — I2510 Atherosclerotic heart disease of native coronary artery without angina pectoris: Secondary | ICD-10-CM | POA: Diagnosis not present

## 2022-08-29 DIAGNOSIS — Z471 Aftercare following joint replacement surgery: Secondary | ICD-10-CM | POA: Diagnosis not present

## 2022-08-29 DIAGNOSIS — Z79899 Other long term (current) drug therapy: Secondary | ICD-10-CM | POA: Diagnosis not present

## 2022-08-29 DIAGNOSIS — M1712 Unilateral primary osteoarthritis, left knee: Principal | ICD-10-CM | POA: Insufficient documentation

## 2022-08-29 DIAGNOSIS — G8918 Other acute postprocedural pain: Secondary | ICD-10-CM | POA: Diagnosis not present

## 2022-08-29 HISTORY — PX: TOTAL KNEE ARTHROPLASTY: SHX125

## 2022-08-29 SURGERY — ARTHROPLASTY, KNEE, TOTAL
Anesthesia: Spinal | Site: Knee | Laterality: Left

## 2022-08-29 MED ORDER — GABAPENTIN 300 MG PO CAPS
900.0000 mg | ORAL_CAPSULE | Freq: Every day | ORAL | Status: DC
  Administered 2022-08-29: 900 mg via ORAL
  Filled 2022-08-29: qty 3

## 2022-08-29 MED ORDER — POLYETHYLENE GLYCOL 3350 17 G PO PACK: 17.0000 g | PACK | Freq: Every day | ORAL | Status: DC | PRN

## 2022-08-29 MED ORDER — POTASSIUM CHLORIDE IN NACL 20-0.45 MEQ/L-% IV SOLN
INTRAVENOUS | Status: DC
  Filled 2022-08-29: qty 1000

## 2022-08-29 MED ORDER — HYDROMORPHONE HCL 1 MG/ML IJ SOLN
0.2500 mg | INTRAMUSCULAR | Status: DC | PRN
  Administered 2022-08-29 (×2): 0.5 mg via INTRAVENOUS

## 2022-08-29 MED ORDER — ZOLPIDEM TARTRATE 5 MG PO TABS: 5.0000 mg | ORAL_TABLET | Freq: Every evening | ORAL | Status: DC | PRN

## 2022-08-29 MED ORDER — ASPIRIN 325 MG PO TBEC
325.0000 mg | DELAYED_RELEASE_TABLET | Freq: Every day | ORAL | Status: DC
  Administered 2022-08-30: 325 mg via ORAL
  Filled 2022-08-29: qty 1

## 2022-08-29 MED ORDER — DEXAMETHASONE SODIUM PHOSPHATE 10 MG/ML IJ SOLN
INTRAMUSCULAR | Status: DC | PRN
  Administered 2022-08-29: 8 mg via INTRAVENOUS

## 2022-08-29 MED ORDER — PROPOFOL 10 MG/ML IV BOLUS
INTRAVENOUS | Status: AC
  Filled 2022-08-29: qty 20

## 2022-08-29 MED ORDER — MIDAZOLAM HCL 2 MG/2ML IJ SOLN
INTRAMUSCULAR | Status: AC
  Filled 2022-08-29: qty 2

## 2022-08-29 MED ORDER — PHENYLEPHRINE HCL-NACL 20-0.9 MG/250ML-% IV SOLN
INTRAVENOUS | Status: DC | PRN
  Administered 2022-08-29: 25 ug/min via INTRAVENOUS

## 2022-08-29 MED ORDER — OXYCODONE HCL 5 MG PO TABS
ORAL_TABLET | ORAL | Status: AC
  Filled 2022-08-29: qty 2

## 2022-08-29 MED ORDER — DIPHENHYDRAMINE HCL 12.5 MG/5ML PO ELIX: 12.5000 mg | ORAL_SOLUTION | ORAL | Status: DC | PRN

## 2022-08-29 MED ORDER — CEFAZOLIN SODIUM-DEXTROSE 2-4 GM/100ML-% IV SOLN
2.0000 g | Freq: Four times a day (QID) | INTRAVENOUS | Status: AC
  Administered 2022-08-29 (×2): 2 g via INTRAVENOUS
  Filled 2022-08-29: qty 100

## 2022-08-29 MED ORDER — POVIDONE-IODINE 10 % EX SWAB
2.0000 | Freq: Once | CUTANEOUS | Status: AC
  Administered 2022-08-29: 2 via TOPICAL

## 2022-08-29 MED ORDER — METOCLOPRAMIDE HCL 5 MG PO TABS: 5.0000 mg | ORAL_TABLET | Freq: Three times a day (TID) | ORAL | Status: DC | PRN

## 2022-08-29 MED ORDER — SODIUM CHLORIDE 0.9 % IR SOLN
Status: DC | PRN
  Administered 2022-08-29: 1000 mL

## 2022-08-29 MED ORDER — GABAPENTIN 300 MG PO CAPS: 900.0000 mg | ORAL_CAPSULE | Freq: Every day | ORAL | Status: DC

## 2022-08-29 MED ORDER — ORAL CARE MOUTH RINSE: 15.0000 mL | Freq: Once | OROMUCOSAL | Status: AC

## 2022-08-29 MED ORDER — DOCUSATE SODIUM 100 MG PO CAPS
100.0000 mg | ORAL_CAPSULE | Freq: Two times a day (BID) | ORAL | Status: DC
  Administered 2022-08-29 – 2022-08-30 (×2): 100 mg via ORAL
  Filled 2022-08-29 (×2): qty 1

## 2022-08-29 MED ORDER — BUPIVACAINE HCL (PF) 0.25 % IJ SOLN
INTRAMUSCULAR | Status: AC
  Filled 2022-08-29: qty 30

## 2022-08-29 MED ORDER — MENTHOL 3 MG MT LOZG: 1.0000 | LOZENGE | OROMUCOSAL | Status: DC | PRN

## 2022-08-29 MED ORDER — POVIDONE-IODINE 7.5 % EX SOLN: Freq: Once | CUTANEOUS | Status: DC

## 2022-08-29 MED ORDER — ONDANSETRON HCL 4 MG/2ML IJ SOLN: 4.0000 mg | Freq: Four times a day (QID) | INTRAMUSCULAR | Status: DC | PRN

## 2022-08-29 MED ORDER — OXYCODONE HCL 5 MG/5ML PO SOLN: 5.0000 mg | Freq: Once | ORAL | Status: DC | PRN

## 2022-08-29 MED ORDER — POVIDONE-IODINE 10 % EX SWAB: 2.0000 | Freq: Once | CUTANEOUS | Status: DC

## 2022-08-29 MED ORDER — OXYCODONE HCL 5 MG PO TABS
5.0000 mg | ORAL_TABLET | ORAL | Status: DC | PRN
  Administered 2022-08-29 – 2022-08-30 (×2): 5 mg via ORAL
  Filled 2022-08-29 (×4): qty 1

## 2022-08-29 MED ORDER — FENTANYL CITRATE (PF) 100 MCG/2ML IJ SOLN
INTRAMUSCULAR | Status: DC | PRN
  Administered 2022-08-29 (×2): 25 ug via INTRAVENOUS
  Administered 2022-08-29: 50 ug via INTRAVENOUS
  Administered 2022-08-29 (×4): 25 ug via INTRAVENOUS

## 2022-08-29 MED ORDER — HYDROMORPHONE HCL 1 MG/ML IJ SOLN: 0.5000 mg | INTRAMUSCULAR | Status: DC | PRN

## 2022-08-29 MED ORDER — FENTANYL CITRATE (PF) 100 MCG/2ML IJ SOLN
INTRAMUSCULAR | Status: AC
  Filled 2022-08-29: qty 2

## 2022-08-29 MED ORDER — PROPOFOL 500 MG/50ML IV EMUL
INTRAVENOUS | Status: DC | PRN
  Administered 2022-08-29: 50 ug/kg/min via INTRAVENOUS

## 2022-08-29 MED ORDER — HYDRALAZINE HCL 20 MG/ML IJ SOLN
INTRAMUSCULAR | Status: DC | PRN
  Administered 2022-08-29: 4 mg via INTRAVENOUS

## 2022-08-29 MED ORDER — ALUM & MAG HYDROXIDE-SIMETH 200-200-20 MG/5ML PO SUSP: 30.0000 mL | ORAL | Status: DC | PRN

## 2022-08-29 MED ORDER — LIDOCAINE HCL (CARDIAC) PF 100 MG/5ML IV SOSY
PREFILLED_SYRINGE | INTRAVENOUS | Status: DC | PRN
  Administered 2022-08-29: 40 mg via INTRAVENOUS

## 2022-08-29 MED ORDER — METOCLOPRAMIDE HCL 5 MG/ML IJ SOLN: 5.0000 mg | Freq: Three times a day (TID) | INTRAMUSCULAR | Status: DC | PRN

## 2022-08-29 MED ORDER — STERILE WATER FOR IRRIGATION IR SOLN
Status: DC | PRN
  Administered 2022-08-29: 2000 mL

## 2022-08-29 MED ORDER — PROPOFOL 10 MG/ML IV BOLUS
INTRAVENOUS | Status: DC | PRN
  Administered 2022-08-29: 20 mg via INTRAVENOUS
  Administered 2022-08-29 (×2): 10 mg via INTRAVENOUS

## 2022-08-29 MED ORDER — ONDANSETRON HCL 4 MG/2ML IJ SOLN: 4.0000 mg | Freq: Once | INTRAMUSCULAR | Status: DC | PRN

## 2022-08-29 MED ORDER — BISACODYL 10 MG RE SUPP: 10.0000 mg | Freq: Every day | RECTAL | Status: DC | PRN

## 2022-08-29 MED ORDER — SENNA-DOCUSATE SODIUM 8.6-50 MG PO TABS: 2.0000 | ORAL_TABLET | Freq: Every day | ORAL | 1 refills | Status: DC

## 2022-08-29 MED ORDER — CHLORHEXIDINE GLUCONATE 0.12 % MT SOLN
15.0000 mL | Freq: Once | OROMUCOSAL | Status: AC
  Administered 2022-08-29: 15 mL via OROMUCOSAL

## 2022-08-29 MED ORDER — LACTATED RINGERS IV BOLUS
500.0000 mL | Freq: Once | INTRAVENOUS | Status: AC
  Administered 2022-08-29: 500 mL via INTRAVENOUS

## 2022-08-29 MED ORDER — GABAPENTIN 400 MG PO CAPS
1200.0000 mg | ORAL_CAPSULE | Freq: Every day | ORAL | Status: DC
  Administered 2022-08-30: 1200 mg via ORAL
  Filled 2022-08-29: qty 3

## 2022-08-29 MED ORDER — ONDANSETRON HCL 4 MG PO TABS: 4.0000 mg | ORAL_TABLET | Freq: Four times a day (QID) | ORAL | Status: DC | PRN

## 2022-08-29 MED ORDER — KETAMINE HCL 10 MG/ML IJ SOLN
INTRAMUSCULAR | Status: AC
  Filled 2022-08-29: qty 1

## 2022-08-29 MED ORDER — ONDANSETRON HCL 4 MG/2ML IJ SOLN
INTRAMUSCULAR | Status: DC | PRN
  Administered 2022-08-29: 4 mg via INTRAVENOUS

## 2022-08-29 MED ORDER — PHENOL 1.4 % MT LIQD: 1.0000 | OROMUCOSAL | Status: DC | PRN

## 2022-08-29 MED ORDER — ACETAMINOPHEN 500 MG PO TABS
1000.0000 mg | ORAL_TABLET | Freq: Four times a day (QID) | ORAL | Status: DC
  Administered 2022-08-29 – 2022-08-30 (×3): 1000 mg via ORAL
  Filled 2022-08-29 (×3): qty 2

## 2022-08-29 MED ORDER — OXYCODONE HCL 5 MG PO TABS
10.0000 mg | ORAL_TABLET | ORAL | Status: DC | PRN
  Administered 2022-08-29: 10 mg via ORAL

## 2022-08-29 MED ORDER — ACETAMINOPHEN 500 MG PO TABS
1000.0000 mg | ORAL_TABLET | Freq: Once | ORAL | Status: AC
  Administered 2022-08-29: 1000 mg via ORAL
  Filled 2022-08-29: qty 2

## 2022-08-29 MED ORDER — HYDRALAZINE HCL 20 MG/ML IJ SOLN
INTRAMUSCULAR | Status: AC
  Filled 2022-08-29: qty 1

## 2022-08-29 MED ORDER — ASPIRIN 325 MG PO TBEC: 325.0000 mg | DELAYED_RELEASE_TABLET | Freq: Two times a day (BID) | ORAL | 0 refills | Status: DC

## 2022-08-29 MED ORDER — PHENYLEPHRINE HCL (PRESSORS) 10 MG/ML IV SOLN
INTRAVENOUS | Status: AC
  Filled 2022-08-29: qty 1

## 2022-08-29 MED ORDER — 0.9 % SODIUM CHLORIDE (POUR BTL) OPTIME
TOPICAL | Status: DC | PRN
  Administered 2022-08-29: 1000 mL

## 2022-08-29 MED ORDER — KETOROLAC TROMETHAMINE 30 MG/ML IJ SOLN
INTRAMUSCULAR | Status: AC
  Filled 2022-08-29: qty 1

## 2022-08-29 MED ORDER — EPHEDRINE SULFATE-NACL 50-0.9 MG/10ML-% IV SOSY
PREFILLED_SYRINGE | INTRAVENOUS | Status: DC | PRN
  Administered 2022-08-29 (×2): 5 mg via INTRAVENOUS

## 2022-08-29 MED ORDER — KETAMINE HCL 10 MG/ML IJ SOLN
INTRAMUSCULAR | Status: DC | PRN
  Administered 2022-08-29: 30 mg via INTRAVENOUS

## 2022-08-29 MED ORDER — GABAPENTIN 300 MG PO CAPS: 600.0000 mg | ORAL_CAPSULE | Freq: Every day | ORAL | Status: DC

## 2022-08-29 MED ORDER — KETOROLAC TROMETHAMINE 30 MG/ML IJ SOLN
INTRAMUSCULAR | Status: DC | PRN
  Administered 2022-08-29: 30 mg

## 2022-08-29 MED ORDER — MIDAZOLAM HCL 2 MG/2ML IJ SOLN
INTRAMUSCULAR | Status: DC | PRN
  Administered 2022-08-29: 1 mg via INTRAVENOUS

## 2022-08-29 MED ORDER — CEFAZOLIN SODIUM-DEXTROSE 2-4 GM/100ML-% IV SOLN
2.0000 g | INTRAVENOUS | Status: AC
  Administered 2022-08-29: 2 g via INTRAVENOUS
  Filled 2022-08-29: qty 100

## 2022-08-29 MED ORDER — PROPOFOL 1000 MG/100ML IV EMUL
INTRAVENOUS | Status: AC
  Filled 2022-08-29: qty 100

## 2022-08-29 MED ORDER — MAGNESIUM CITRATE PO SOLN: 1.0000 | Freq: Once | ORAL | Status: DC | PRN

## 2022-08-29 MED ORDER — BUPIVACAINE HCL 0.25 % IJ SOLN
INTRAMUSCULAR | Status: DC | PRN
  Administered 2022-08-29: 30 mL

## 2022-08-29 MED ORDER — HYDROMORPHONE HCL 1 MG/ML IJ SOLN
INTRAMUSCULAR | Status: AC
  Filled 2022-08-29: qty 1

## 2022-08-29 MED ORDER — LACTATED RINGERS IV SOLN: INTRAVENOUS | Status: DC

## 2022-08-29 MED ORDER — CEFAZOLIN SODIUM-DEXTROSE 2-4 GM/100ML-% IV SOLN
INTRAVENOUS | Status: AC
  Filled 2022-08-29: qty 100

## 2022-08-29 MED ORDER — LACTATED RINGERS IV BOLUS: 250.0000 mL | Freq: Once | INTRAVENOUS | Status: DC

## 2022-08-29 MED ORDER — PROPOFOL 500 MG/50ML IV EMUL
INTRAVENOUS | Status: AC
  Filled 2022-08-29: qty 50

## 2022-08-29 MED ORDER — OXYCODONE HCL 5 MG PO TABS: 5.0000 mg | ORAL_TABLET | Freq: Once | ORAL | Status: DC | PRN

## 2022-08-29 MED ORDER — GABAPENTIN 300 MG PO CAPS
300.0000 mg | ORAL_CAPSULE | Freq: Three times a day (TID) | ORAL | Status: DC
Start: 2022-08-30 — End: 2022-08-29

## 2022-08-29 MED ORDER — TRANEXAMIC ACID-NACL 1000-0.7 MG/100ML-% IV SOLN
INTRAVENOUS | Status: AC
  Administered 2022-08-29: 1000 mg
  Filled 2022-08-29: qty 100

## 2022-08-29 MED ORDER — OXYCODONE HCL 5 MG PO TABS: 5.0000 mg | ORAL_TABLET | ORAL | 0 refills | Status: DC | PRN

## 2022-08-29 MED ORDER — DEXAMETHASONE SODIUM PHOSPHATE 10 MG/ML IJ SOLN
10.0000 mg | Freq: Once | INTRAMUSCULAR | Status: AC
  Administered 2022-08-30: 10 mg via INTRAVENOUS
  Filled 2022-08-29: qty 1

## 2022-08-29 MED ORDER — ONDANSETRON HCL 4 MG PO TABS: 4.0000 mg | ORAL_TABLET | Freq: Three times a day (TID) | ORAL | 0 refills | Status: DC | PRN

## 2022-08-29 MED ORDER — TRANEXAMIC ACID-NACL 1000-0.7 MG/100ML-% IV SOLN
1000.0000 mg | INTRAVENOUS | Status: AC
  Administered 2022-08-29: 1000 mg via INTRAVENOUS
  Filled 2022-08-29: qty 100

## 2022-08-29 MED ORDER — ROPIVACAINE HCL 5 MG/ML IJ SOLN
INTRAMUSCULAR | Status: DC | PRN
  Administered 2022-08-29: 20 mL via PERINEURAL

## 2022-08-29 MED ORDER — BUPIVACAINE IN DEXTROSE 0.75-8.25 % IT SOLN
INTRATHECAL | Status: DC | PRN
  Administered 2022-08-29: 1.6 mL via INTRATHECAL

## 2022-08-29 SURGICAL SUPPLY — 73 items
ATTUNE MED DOME PAT 38 KNEE (Knees) IMPLANT
ATTUNE PS FEM LT SZ 8 CEM KNEE (Femur) IMPLANT
BAG COUNTER SPONGE SURGICOUNT (BAG) IMPLANT
BAG ZIPLOCK 12X15 (MISCELLANEOUS) ×1 IMPLANT
BANDAGE ESMARK 6X9 LF (GAUZE/BANDAGES/DRESSINGS) ×1 IMPLANT
BASEPLATE TIB CMT FB PCKT SZ 8 (Knees) IMPLANT
BIT DRILL QUICK REL 1/8 2PK SL (DRILL) IMPLANT
BLADE SAG 18X100X1.27 (BLADE) ×1 IMPLANT
BLADE SAW SGTL 13X75X1.27 (BLADE) ×1 IMPLANT
BLADE SURG 15 STRL LF DISP TIS (BLADE) ×2 IMPLANT
BLADE SURG 15 STRL SS (BLADE) ×2
BNDG ELASTIC 6X10 VLCR STRL LF (GAUZE/BANDAGES/DRESSINGS) ×1 IMPLANT
BNDG ELASTIC 6X15 VLCR STRL LF (GAUZE/BANDAGES/DRESSINGS) ×1 IMPLANT
BNDG ESMARK 6X9 LF (GAUZE/BANDAGES/DRESSINGS) ×1
BOWL SMART MIX CTS (DISPOSABLE) ×2 IMPLANT
CEMENT HV SMART SET (Cement) ×2 IMPLANT
CLSR STERI-STRIP ANTIMIC 1/2X4 (GAUZE/BANDAGES/DRESSINGS) ×3 IMPLANT
COVER SURGICAL LIGHT HANDLE (MISCELLANEOUS) ×2 IMPLANT
CUFF TOURN SGL QUICK 34 (TOURNIQUET CUFF) ×1
CUFF TRNQT CYL 34X4.125X (TOURNIQUET CUFF) ×2 IMPLANT
DERMABOND ADVANCED (GAUZE/BANDAGES/DRESSINGS) ×1
DERMABOND ADVANCED .7 DNX12 (GAUZE/BANDAGES/DRESSINGS) IMPLANT
DRAPE EXTREMITY T 121X128X90 (DISPOSABLE) ×1 IMPLANT
DRAPE POUCH INSTRU U-SHP 10X18 (DRAPES) ×1 IMPLANT
DRAPE SHEET LG 3/4 BI-LAMINATE (DRAPES) ×2 IMPLANT
DRAPE U-SHAPE 47X51 STRL (DRAPES) ×2 IMPLANT
DRILL QUICK RELEASE 1/8 INCH (DRILL) ×1
DRSG MEPILEX POST OP 4X12 (GAUZE/BANDAGES/DRESSINGS) ×1 IMPLANT
DRSG MEPILEX POST OP 4X8 (GAUZE/BANDAGES/DRESSINGS) ×1 IMPLANT
DRSG PAD ABDOMINAL 8X10 ST (GAUZE/BANDAGES/DRESSINGS) IMPLANT
DURAPREP 26ML APPLICATOR (WOUND CARE) ×4 IMPLANT
ELECT REM PT RETURN 15FT ADLT (MISCELLANEOUS) ×2 IMPLANT
FACESHIELD WRAPAROUND (MASK) ×4 IMPLANT
FACESHIELD WRAPAROUND OR TEAM (MASK) ×1 IMPLANT
GAUZE PAD ABD 8X10 STRL (GAUZE/BANDAGES/DRESSINGS) ×3 IMPLANT
GAUZE SPONGE 4X4 12PLY STRL (GAUZE/BANDAGES/DRESSINGS) IMPLANT
GLOVE BIO SURGEON STRL SZ 6.5 (GLOVE) ×2 IMPLANT
GLOVE BIO SURGEON STRL SZ7.5 (GLOVE) ×1 IMPLANT
GLOVE BIOGEL PI IND STRL 7.0 (GLOVE) ×2 IMPLANT
GLOVE BIOGEL PI IND STRL 8 (GLOVE) ×2 IMPLANT
GOWN STRL REIN XL XLG (GOWN DISPOSABLE) ×2 IMPLANT
HANDPIECE INTERPULSE COAX TIP (DISPOSABLE) ×1
HOLDER FOLEY CATH W/STRAP (MISCELLANEOUS) IMPLANT
IMMOBILIZER KNEE 22 UNIV (SOFTGOODS) IMPLANT
INSERT TIBIA FIXED BEARING SZ8 (Insert) IMPLANT
KIT BASIN OR (CUSTOM PROCEDURE TRAY) ×1 IMPLANT
KIT TURNOVER KIT A (KITS) IMPLANT
MANIFOLD NEPTUNE II (INSTRUMENTS) ×1 IMPLANT
NDL SAFETY ECLIP 18X1.5 (MISCELLANEOUS) ×1 IMPLANT
NS IRRIG 1000ML POUR BTL (IV SOLUTION) ×2 IMPLANT
PACK BLADE SAW RECIP 70 3 PT (BLADE) IMPLANT
PACK TOTAL JOINT (CUSTOM PROCEDURE TRAY) ×1 IMPLANT
PACK TOTAL KNEE CUSTOM (KITS) ×1 IMPLANT
PADDING WEBRIL 4 STERILE (GAUZE/BANDAGES/DRESSINGS) IMPLANT
PROTECTOR NERVE ULNAR (MISCELLANEOUS) ×2 IMPLANT
SET HNDPC FAN SPRY TIP SCT (DISPOSABLE) ×2 IMPLANT
SET PAD KNEE POSITIONER (MISCELLANEOUS) ×1 IMPLANT
SUCTION FRAZIER HANDLE 12FR (TUBING) ×1
SUCTION TUBE FRAZIER 12FR DISP (TUBING) ×1 IMPLANT
SUT MNCRL AB 4-0 PS2 18 (SUTURE) IMPLANT
SUT VIC AB 1 CT1 36 (SUTURE) ×3 IMPLANT
SUT VIC AB 2-0 CT1 27 (SUTURE) ×2
SUT VIC AB 2-0 CT1 TAPERPNT 27 (SUTURE) ×2 IMPLANT
SUT VIC AB 3-0 SH 27 (SUTURE) ×2
SUT VIC AB 3-0 SH 27X BRD (SUTURE) ×2 IMPLANT
SYR 30ML LL (SYRINGE) ×1 IMPLANT
SYR 3ML LL SCALE MARK (SYRINGE) ×1 IMPLANT
TOWEL OR 17X26 10 PK STRL BLUE (TOWEL DISPOSABLE) ×1 IMPLANT
TOWEL OR NON WOVEN STRL DISP B (DISPOSABLE) ×1 IMPLANT
TRAY FOLEY MTR SLVR 16FR STAT (SET/KITS/TRAYS/PACK) ×2 IMPLANT
TUBE SUCTION HIGH CAP CLEAR NV (SUCTIONS) ×2 IMPLANT
WATER STERILE IRR 1000ML POUR (IV SOLUTION) ×3 IMPLANT
WRAP KNEE MAXI GEL POST OP (GAUZE/BANDAGES/DRESSINGS) ×2 IMPLANT

## 2022-08-29 NOTE — Anesthesia Procedure Notes (Signed)
Spinal  Patient location during procedure: OR Start time: 08/29/2022 7:27 AM End time: 08/29/2022 7:30 AM Staffing Performed: resident/CRNA  Anesthesiologist: Eilene Ghazi, MD Resident/CRNA: Yolonda Kida, CRNA Performed by: Yolonda Kida, CRNA Authorized by: Eilene Ghazi, MD   Preanesthetic Checklist Completed: patient identified, IV checked, site marked, risks and benefits discussed, surgical consent, monitors and equipment checked, pre-op evaluation and timeout performed Spinal Block Patient position: sitting Prep: DuraPrep Patient monitoring: blood pressure, continuous pulse ox and heart rate Approach: midline Location: L3-4 Injection technique: single-shot Needle Needle type: Pencan  Needle gauge: 24 G Assessment Sensory level: T6 Events: CSF return

## 2022-08-29 NOTE — Anesthesia Procedure Notes (Signed)
Anesthesia Regional Block: Adductor canal block   Pre-Anesthetic Checklist: , timeout performed,  Correct Patient, Correct Site, Correct Laterality,  Correct Procedure, Correct Position, site marked,  Risks and benefits discussed,  Surgical consent,  Pre-op evaluation,  At surgeon's request and post-op pain management  Laterality: Left  Prep: chloraprep       Needles:  Injection technique: Single-shot  Needle Type: Echogenic Needle     Needle Length: 9cm      Additional Needles:   Procedures:,,,, ultrasound used (permanent image in chart),,    Narrative:  Start time: 08/29/2022 6:49 AM End time: 08/29/2022 6:54 AM Injection made incrementally with aspirations every 5 mL.  Performed by: Personally  Anesthesiologist: Eilene Ghazi, MD  Additional Notes: Patient tolerated the procedure well without complications

## 2022-08-29 NOTE — Plan of Care (Signed)
Problem: Education: Goal: Knowledge of the prescribed therapeutic regimen will improve Outcome: Progressing   Problem: Activity: Goal: Ability to avoid complications of mobility impairment will improve Outcome: Progressing   Problem: Clinical Measurements: Goal: Postoperative complications will be avoided or minimized Outcome: Progressing   Problem: Pain Management: Goal: Pain level will decrease with appropriate interventions Outcome: Progressing   Haydee Salter, RN. 08/29/22 8:20 PM

## 2022-08-29 NOTE — Anesthesia Procedure Notes (Signed)
Anesthesia Procedure Image    

## 2022-08-29 NOTE — Evaluation (Signed)
Physical Therapy Evaluation Patient Details Name: Patrick Moyer MRN: [id number] DOB: 12/18/1951 Today's Date: 08/29/2022  History of Present Illness  Pt is a 71yo male presenting s/p L-TKA on 08/29/22. PMH: GERD, OSA on CPAP, Marfan habitus.  Clinical Impression  Patrick Moyer is a 71 y.o. male POD 0 s/p L-TKA evaluated in the PACU for possible same-day discharge. Pt received with towel over face reporting "I feel groggy, I'm just trying to rest," 3/10 pain in the L thigh, additionally reporting mild dizziness and lightheadedness, BP supine prior to activity 117/66. Patient reports modified independence using SPC for community mobility at baseline. Patient is now limited by functional impairments (see PT problem list below) and requires min guard for bed mobility and for transfers; after transferring pt reporting dizziness and lightheadedness, returned to supine BP 132/73. Pt reported reduction of symptoms so progresses mobility and patient was able to ambulate 40 feet with RW and min guard level of assist with one posterior LOB that pt was able to self-correct without assistance; additionally, pt reporting bilateral wrist pain ("it's my arthritis") that reduces his ability to weightbear on BUE to assist with advancement of LLE. Patient instructed in exercise to facilitate ROM and circulation to manage edema. At end of session pt reporting continuance of dizziness and lightheadedness, RN notified. Patient will benefit from continued skilled PT interventions to address impairments and progress towards PLOF. Acute PT will follow to progress mobility and HEP in preparation for safe discharge home.       Recommendations for follow up therapy are one component of a multi-disciplinary discharge planning process, led by the attending physician.  Recommendations may be updated based on patient status, additional functional criteria and insurance authorization.  Follow Up Recommendations Follow physician's  recommendations for discharge plan and follow up therapies      Assistance Recommended at Discharge Intermittent Supervision/Assistance  Patient can return home with the following  A little help with walking and/or transfers;A little help with bathing/dressing/bathroom;Assistance with cooking/housework;Assist for transportation;Help with stairs or ramp for entrance    Equipment Recommendations Other (comment);Rolling [place] (2 wheels) (Pt would benefit from bilateral platform attachments to RW to reduce strain on wrist and allow for BUE weightbearing during ambulation)  Recommendations for Other Services       Functional Status Assessment Patient has had a recent decline in their functional status and demonstrates the ability to make significant improvements in function in a reasonable and predictable amount of time.     Precautions / Restrictions Precautions Precautions: Fall Restrictions Weight Bearing Restrictions: No Other Position/Activity Restrictions: wbat      Mobility  Bed Mobility Overal bed mobility: Needs Assistance Bed Mobility: Supine to Sit, Sit to Supine     Supine to sit: Supervision Sit to supine: Supervision   General bed mobility comments: Increased time, cues for sequencing. BP in supine prior to activity 117/66    Transfers Overall transfer level: Needs assistance Equipment used: Rolling [place]iltz (2 wheels) Transfers: Sit to/from Stand Sit to Stand: Min guard, Min assist, From elevated surface           General transfer comment: Pt min guard for safety only, no physical assist required for sit to stand. For stand to sit transfer, pt required min assist for controlled lowering as pt tended to "plop down." During initial sit to stand transfer from stretcher surface, pt reporting dizziness and lightheadedness, BP 132/73. Pt reports dizziness with any positional changes including long sitting on stretcher from supine positioning.  Additionally, pt  incontinent of urine with each sit to stand transfer and increased intrathoracic pressure, mesh underwear and pad applied to attempt ambulation.    Ambulation/Gait Ambulation/Gait assistance: Min guard Gait Distance (Feet): 40 Feet Assistive device: Rolling Posthumus (2 wheels) Gait Pattern/deviations: Step-to pattern, Decreased step length - left, Decreased stance time - left, Decreased weight shift to left, Wide base of support Gait velocity: decreased     General Gait Details: Pt ambulated with RW and min guard, no physical assist required, decreased weight shift to L, moving primarily from the hip and resisting any knee flexion. Pt had one posterolateral LOB to the right while turning to the left but was able to self-correct. During ambulation pt reporting increased pain in b/l wrists secondary to arthritis and required three short standing breaks to allow wrists to rest from providing support on RW.  Stairs            Wheelchair Mobility    Modified Rankin (Stroke Patients Only)       Balance Overall balance assessment: Needs assistance Sitting-balance support: Feet supported, No upper extremity supported Sitting balance-[name]: Good     Standing balance support: Reliant on assistive device for balance, During functional activity, Bilateral upper extremity supported Standing balance-[name]: Poor                               Pertinent Vitals/Pain Pain Assessment Pain Assessment: 0-10 Pain Score: 3  Pain Location: left thigh, bilateral wrists Pain Descriptors / Indicators: Operative site guarding, Sore Pain Intervention(s): Limited activity within patient's tolerance, Monitored during session, Repositioned    Home Living Family/patient expects to be discharged to:: Private residence Living Arrangements: Spouse/significant other Available Help at Discharge: Family;Available 24 hours/day Type of Home: House Home Access: Level entry        Home Layout: One level Home Equipment: Cane - single point      Prior Function Prior Level of Function : Independent/Modified Independent;Working/employed;Driving             Mobility Comments: Mod ind, SPC for longer distances ADLs Comments: IND     Hand Dominance   Dominant Hand: Right    Extremity/Trunk Assessment   Upper Extremity Assessment Upper Extremity Assessment: Overall WFL for tasks assessed    Lower Extremity Assessment Lower Extremity Assessment: RLE deficits/detail;LLE deficits/detail RLE Deficits / Details: MMT ank DF/PF 5/5 RLE Sensation: WNL (Pt reporting still numb in glutes, but normal sensation at feet) LLE Deficits / Details: MMT ank DF/PF 4/5, with verbal cuing able to resist additional force LLE Sensation: decreased light touch (Pt reporting still numb in glutes, slightly less sensation in L than R foot;)    Cervical / Trunk Assessment Cervical / Trunk Assessment: Normal  Communication   Communication: No difficulties  Cognition Arousal/Alertness: Awake/alert Behavior During Therapy: WFL for tasks assessed/performed, Anxious Overall Cognitive Status: Within Functional Limits for tasks assessed                                          General Comments General comments (skin integrity, edema, etc.): Wife Zella Ball present for session    Exercises Total Joint Exercises Ankle Circles/Pumps: AROM, Both, 20 reps Quad Sets: AROM, Left, Other reps (comment) (2) Short Arc Quad: AROM, Left, Other reps (comment) (2) Heel Slides: AROM, Other reps (comment), Left (  2) Hip ABduction/ADduction: AROM, Left, 5 reps Straight Leg Raises: AROM, Left, 5 reps Goniometric ROM: -5-60 by visual approximation   Assessment/Plan    PT Assessment Patient needs continued PT services  PT Problem List Decreased strength;Decreased range of motion;Decreased activity tolerance;Decreased balance;Decreased mobility;Decreased coordination;Pain       PT  Treatment Interventions DME instruction;Gait training;Functional mobility training;Therapeutic activities;Therapeutic exercise;Balance training;Neuromuscular re-education;Patient/family education    PT Goals (Current goals can be found in the Care Plan section)  Acute Rehab PT Goals Patient Stated Goal: To walk better PT Goal Formulation: With patient Time For Goal Achievement: 09/05/22 Potential to Achieve Goals: Good    Frequency 7X/week     Co-evaluation               AM-PAC PT "6 Clicks" Mobility  Outcome Measure Help needed turning from your back to your side while in a flat bed without using bedrails?: A Little Help needed moving from lying on your back to sitting on the side of a flat bed without using bedrails?: A Little Help needed moving to and from a bed to a chair (including a wheelchair)?: A Little Help needed standing up from a chair using your arms (e.g., wheelchair or bedside chair)?: A Little Help needed to walk in hospital room?: A Little Help needed climbing 3-5 steps with a railing? : A Little 6 Click Score: 18    End of Session Equipment Utilized During Treatment: Gait belt Activity Tolerance: Patient limited by fatigue;Other (comment) (Dizzy, lightheaded) Patient left: in chair;with call bell/phone within reach;with family/visitor present Nurse Communication: Mobility status PT Visit Diagnosis: Pain;Difficulty in walking, not elsewhere classified (R26.2) Pain - Right/Left: Left Pain - part of body: Knee    Time: 6734-1937 PT Time Calculation (min) (ACUTE ONLY): 43 min   Charges:   PT Evaluation $PT Eval Low Complexity: 1 Low PT Treatments $Gait Training: 8-22 mins $Therapeutic Exercise: 8-22 mins        Jamesetta Geralds, PT, DPT [facility] Department Office: [phone] Pager: [phone]  Jamesetta Geralds 08/29/2022, 3:40 PM

## 2022-08-29 NOTE — Op Note (Signed)
DATE OF SURGERY:  08/29/2022 TIME: 10:18 AM  PATIENT NAME:  Patrick Moyer   AGE: 71 y.o.    PRE-OPERATIVE DIAGNOSIS: Left knee anteromedial osteoarthritis  POST-OPERATIVE DIAGNOSIS: Left knee primary localized osteoarthritis involving the patellofemoral joint and medial femoral condyle  PROCEDURE: LEFT total Knee Arthroplasty  SURGEON:  Eulas Post, MD   ASSISTANT: Alfonse Alpers, PA-C, present and scrubbed throughout the case, critical for assistance with exposure, retraction, instrumentation, and closure.   OPERATIVE IMPLANTS: Depuy Attune size 8 posterior Stabilized Femur, with a size 8 fixed Bearing Tibia, 5 polyethylene insert with a 38 medialized oval dome polyethylene patella.  PREOPERATIVE INDICATIONS:  Patrick Moyer is a 71 y.o. year old adult with end stage bone on bone degenerative arthritis of the knee who failed conservative treatment, including injections, antiinflammatories, activity modification, and assistive devices, and had significant impairment of their activities of daily living, and elected for Total Knee Arthroplasty.   The risks, benefits, and alternatives were discussed at length including but not limited to the risks of infection, bleeding, nerve injury, stiffness, blood clots, the need for revision surgery, cardiopulmonary complications, among others, and they were willing to proceed.  OPERATIVE FINDINGS AND UNIQUE ASPECTS OF THE CASE: We had intended to perform a partial knee replacement, but upon evaluation of the femoral trochlea he had grade 4 changes with irregular cartilage and full-thickness chondral loss on the patella and femoral trochlea.  Therefore we converted to a total knee replacement.  His leg was extremely long, in fact at 1 point I was standing on a step in order to get the appropriate vantage point, and the legholder clamp ended up beneath the bottom of my down, so I had to change gowns, change a clamp, and place an overlying drape.   Sterile technique was maintained throughout the case.  Additionally, before the procedure he had 2 small abrasions on his toes, neither of which looked infected, he did have 1 small anterolateral skin break I think from the shaving over the knee, where I covered this with a Tegaderm during the operation.  ESTIMATED BLOOD LOSS: 100 mL  OPERATIVE DESCRIPTION:  The patient was brought to the operative room and placed in a supine position.  Anesthesia was administered.  Initially his spinal did not seem to be completely effective, with some ketamine and additional sedation the spinal was ultimately functional.  IV antibiotics were given.  The lower extremity was prepped and draped in the usual sterile fashion.  Time out was performed.  The leg was elevated and exsanguinated and the tourniquet was inflated.  Anterior quadriceps tendon splitting approach was performed.  I exposed the patellofemoral joint, and lateral compartment, and found full-thickness patellofemoral chondral loss, and thus proceeded to convert to a total knee replacement.  The patella was everted and osteophytes were removed.  The anterior horn of the medial and lateral meniscus was removed.   The patella was then measured, and cut with the saw.  The thickness before the cut was 26 and after the cut was 15.  A metal shield was used to protect the patella throughout the case.    The distal femur was opened with the drill and the intramedullary distal femoral cutting jig was utilized, set at 5 degrees resecting 9 mm off the distal femur.  Care was taken to protect the collateral ligaments.  Then the extramedullary tibial cutting jig was utilized making the appropriate cut using the anterior tibial crest as a reference building in appropriate  posterior slope.  Care was taken during the cut to protect the medial and collateral ligaments.  The proximal tibia was removed along with the posterior horns of the menisci.  The PCL was  sacrificed.    The extensor gap was measured and found to have adequate resection, measuring to a size 5.    The distal femoral sizing jig was applied, taking care to avoid notching.  This was set at 3 degrees of external rotation.  Then the 4-in-1 cutting jig was applied and the anterior and posterior femur was cut, along with the chamfer cuts.  All posterior osteophytes were removed.  The flexion gap was then measured and was symmetric with the extension gap.  I completed the distal femoral preparation using the appropriate jig to prepare the box.  The proximal tibia sized and prepared accordingly with the reamer and the punch, and then all components were trialed with the poly insert.  The knee was found to have excellent balance and full motion.    The above named components were then cemented into place and all excess cement was removed.  The real polyethylene implant was placed.  After the cement had cured I released the tourniquet and confirmed excellent hemostasis with no major posterior vessel injury.    The knee was easily taken through a range of motion and the patella tracked well and the knee irrigated copiously and the parapatellar and subcutaneous tissue closed with vicryl, and monocryl with steri strips for the skin.  The wounds were injected with marcaine, and dressed with sterile gauze and the patient was awakened and returned to the PACU in stable and satisfactory condition.  There were no complications.  Total tourniquet time was 119 minutes.

## 2022-08-29 NOTE — Transfer of Care (Signed)
Immediate Anesthesia Transfer of Care Note  Patient: Patrick Moyer  Procedure(s) Performed: UNICOMPARTMENTAL KNEE converted to a total knee (Left: Knee)  Patient Location: PACU  Anesthesia Type:Spinal  Level of Consciousness: drowsy  Airway & Oxygen Therapy: Patient Spontanous Breathing and Patient connected to face mask oxygen  Post-op Assessment: Report given to RN and Post -op Vital signs reviewed and stable  Post vital signs: Reviewed and stable  Last Vitals:  Vitals Value Taken Time  BP 118/72 08/29/22 1056  Temp    Pulse 66 08/29/22 1058  Resp 9 08/29/22 1058  SpO2 93 % 08/29/22 1058  Vitals shown include unvalidated device data.  Last Pain:  Vitals:   08/29/22 0546  TempSrc: Oral  PainSc: 0-No pain         Complications: No notable events documented.

## 2022-08-29 NOTE — Interval H&P Note (Signed)
History and Physical Interval Note:  08/29/2022 7:11 AM  Patrick Moyer  has presented today for surgery, with the diagnosis of djd left knee.  The various methods of treatment have been discussed with the patient and family. After consideration of risks, benefits and other options for treatment, the patient has consented to  Procedure(s): UNICOMPARTMENTAL KNEE (Left) as a surgical intervention.  The patient's history has been reviewed, patient examined, no change in status, stable for surgery.  I have reviewed the patient's chart and labs.  Questions were answered to the patient's satisfaction.     Eulas Post

## 2022-08-30 ENCOUNTER — Encounter (HOSPITAL_COMMUNITY): Payer: Self-pay | Admitting: Orthopedic Surgery

## 2022-08-30 DIAGNOSIS — M1712 Unilateral primary osteoarthritis, left knee: Secondary | ICD-10-CM | POA: Diagnosis not present

## 2022-08-30 DIAGNOSIS — Z79899 Other long term (current) drug therapy: Secondary | ICD-10-CM | POA: Diagnosis not present

## 2022-08-30 DIAGNOSIS — I2510 Atherosclerotic heart disease of native coronary artery without angina pectoris: Secondary | ICD-10-CM | POA: Diagnosis not present

## 2022-08-30 DIAGNOSIS — Z96652 Presence of left artificial knee joint: Secondary | ICD-10-CM | POA: Diagnosis not present

## 2022-08-30 DIAGNOSIS — M6281 Muscle weakness (generalized): Secondary | ICD-10-CM | POA: Diagnosis not present

## 2022-08-30 DIAGNOSIS — R262 Difficulty in walking, not elsewhere classified: Secondary | ICD-10-CM | POA: Diagnosis not present

## 2022-08-30 MED ORDER — PANTOPRAZOLE SODIUM 40 MG PO TBEC
40.0000 mg | DELAYED_RELEASE_TABLET | Freq: Every day | ORAL | Status: DC
  Administered 2022-08-30: 40 mg via ORAL
  Filled 2022-08-30: qty 1

## 2022-08-30 MED ORDER — FLUTICASONE PROPIONATE 50 MCG/ACT NA SUSP
1.0000 | Freq: Every day | NASAL | Status: DC
Start: 2022-08-30 — End: 2022-08-30
  Filled 2022-08-30: qty 16

## 2022-08-30 MED ORDER — MONTELUKAST SODIUM 10 MG PO TABS: 10.0000 mg | ORAL_TABLET | Freq: Every day | ORAL | Status: DC

## 2022-08-30 MED ORDER — LORATADINE 10 MG PO TABS
10.0000 mg | ORAL_TABLET | Freq: Every day | ORAL | Status: DC
  Administered 2022-08-30: 10 mg via ORAL
  Filled 2022-08-30: qty 1

## 2022-08-30 MED ORDER — CELECOXIB 200 MG PO CAPS
200.0000 mg | ORAL_CAPSULE | Freq: Two times a day (BID) | ORAL | Status: DC
  Administered 2022-08-30: 200 mg via ORAL
  Filled 2022-08-30: qty 1

## 2022-08-30 NOTE — Progress Notes (Signed)
Physical Therapy Treatment Patient Details Name: Patrick Moyer MRN: [id number] DOB: 04-05-1951 Today's Date: 08/30/2022   History of Present Illness Pt is a 71yo male presenting s/p L-TKA on 08/29/22. PMH: GERD, OSA on CPAP, Marfan habitus.    PT Comments    Pt seen POD1 in good spirits reporting cessation of dizziness and lightheadedness, pain 1/10 at entry. Pt supervision for bed mobility, min guard for transfers, min guard for ambulation in hallway. Trialed use of RW with reduced BUE support but pt reporting increased L knee pain, pt used Patrick Moyer for remainder of session. Pt completed stair training without overt LOB and min guard, cues for sequencing. Pt has met mobility goals for safe discharge home in care of wife. All education completed and pt has no further questions. PT is signing off, should needs change please reconsult. Thank you for this referral.    Recommendations for follow up therapy are one component of a multi-disciplinary discharge planning process, led by the attending physician.  Recommendations may be updated based on patient status, additional functional criteria and insurance authorization.  Follow Up Recommendations  Follow physician's recommendations for discharge plan and follow up therapies     Assistance Recommended at Discharge Intermittent Supervision/Assistance  Patient can return home with the following A little help with walking and/or transfers;A little help with bathing/dressing/bathroom;Assistance with cooking/housework;Assist for transportation;Help with stairs or ramp for entrance   Equipment Recommendations  Rolling Kampf (2 wheels)    Recommendations for Other Services       Precautions / Restrictions Precautions Precautions: Fall Restrictions Weight Bearing Restrictions: No LLE Weight Bearing: Weight bearing as tolerated Other Position/Activity Restrictions: wbat     Mobility  Bed Mobility Overal bed mobility: Needs Assistance Bed  Mobility: Supine to Sit, Sit to Supine     Supine to sit: Supervision Sit to supine: Supervision   General bed mobility comments: Increased time, cues for sequencing. BP in supine prior to activity 117/66    Transfers Overall transfer level: Needs assistance Equipment used: Rolling [place] (2 wheels) Transfers: Sit to/from Stand Sit to Stand: Min guard           General transfer comment: Pt min guard for safety only, no physical assist required for sit to stand.    Ambulation/Gait Ambulation/Gait assistance: Min guard Gait Distance (Feet): 200 Feet Assistive device: Rolling [place] (2 wheels), Patrick Moyer Gait Pattern/deviations: Step-to pattern, Decreased step length - left, Decreased stance time - left, Decreased weight shift to left, Wide base of support Gait velocity: decreased     General Gait Details: Pt ambulated with Patrick Debord and min guard, no physical assist required or overt LOB noted. Pt preferred Patrick Moyer, trialed RW with reduced BUE support but discontinued secondary to increased L knee pain.   Stairs Stairs: Yes Stairs assistance: Min guard Stair Management: Two rails, Step to pattern, Forwards Number of Stairs: 2 General stair comments: Pt completed stair mobility safely with VCs for sequencing, min guard, no overt LOB   Wheelchair Mobility    Modified Rankin (Stroke Patients Only)       Balance Overall balance assessment: Needs assistance Sitting-balance support: Feet supported, No upper extremity supported Sitting balance-[name]: Good     Standing balance support: Reliant on assistive device for balance, During functional activity, Bilateral upper extremity supported Standing balance-[name]: Poor  Cognition Arousal/Alertness: Awake/alert Behavior During Therapy: WFL for tasks assessed/performed, Anxious Overall Cognitive Status: Within Functional Limits for tasks assessed                                           Exercises Total Joint Exercises Ankle Circles/Pumps: AROM, Both, 20 reps, Supine Quad Sets: AROM, Left, 10 reps, Supine Short Arc Quad: AROM, Left, 10 reps Heel Slides: AROM, Left, 10 reps, Supine Hip ABduction/ADduction: AROM, Left, 10 reps, Supine Straight Leg Raises: Left, 10 reps, Supine, AAROM (with belt) Long Arc Quad: AROM, Left, 5 reps, Seated Goniometric ROM: -5-70 by visual approximation    General Comments        Pertinent Vitals/Pain Pain Assessment Pain Assessment: 0-10 Pain Score: 5  Pain Location: left thigh, bilateral wrists Pain Descriptors / Indicators: Operative site guarding, Sore Pain Intervention(s): Limited activity within patient's tolerance, Monitored during session, Repositioned, Ice applied    Home Living                          Prior Function            PT Goals (current goals can now be found in the care plan section) Acute Rehab PT Goals Patient Stated Goal: To walk better PT Goal Formulation: With patient Time For Goal Achievement: 09/05/22 Potential to Achieve Goals: Good Progress towards PT goals: Progressing toward goals    Frequency    7X/week      PT Plan Current plan remains appropriate    Co-evaluation              AM-PAC PT "6 Clicks" Mobility   Outcome Measure  Help needed turning from your back to your side while in a flat bed without using bedrails?: None Help needed moving from lying on your back to sitting on the side of a flat bed without using bedrails?: None Help needed moving to and from a bed to a chair (including a wheelchair)?: A Little Help needed standing up from a chair using your arms (e.g., wheelchair or bedside chair)?: A Little Help needed to walk in hospital room?: A Little Help needed climbing 3-5 steps with a railing? : A Little 6 Click Score: 20    End of Session Equipment Utilized During Treatment: Gait belt Activity Tolerance:  Patient tolerated treatment well;No increased pain Patient left: in bed;with call bell/phone within reach;with bed alarm set;with nursing/sitter in room Nurse Communication: Mobility status PT Visit Diagnosis: Pain;Difficulty in walking, not elsewhere classified (R26.2) Pain - Right/Left: Left Pain - part of body: Knee     Time: 5638-9373 PT Time Calculation (min) (ACUTE ONLY): 31 min  Charges:  $Gait Training: 8-22 mins $Therapeutic Exercise: 8-22 mins                     Coolidge Breeze, PT, DPT [place] [facility] Office: [phone] Pager: [phone]   Coolidge Breeze 08/30/2022, 11:22 AM

## 2022-08-30 NOTE — Anesthesia Postprocedure Evaluation (Signed)
Anesthesia Post Note  Patient: Patrick Moyer  Procedure(s) Performed: UNICOMPARTMENTAL KNEE converted to a total knee (Left: Knee)     Patient location during evaluation: PACU Anesthesia Type: Spinal Level of consciousness: oriented and awake and alert Pain management: pain level controlled Vital Signs Assessment: post-procedure vital signs reviewed and stable Respiratory status: spontaneous breathing, respiratory function stable and patient connected to nasal cannula oxygen Cardiovascular status: blood pressure returned to baseline and stable Postop Assessment: no headache, no backache and no apparent nausea or vomiting Anesthetic complications: no   No notable events documented.  Last Vitals:  Vitals:   08/30/22 0138 08/30/22 0602  BP: 113/65 134/73  Pulse: 60 (!) 57  Resp: 17 17  Temp: 36.6 C 36.7 C  SpO2: 97% 98%    Last Pain:  Vitals:   08/30/22 0613  TempSrc:   PainSc: 1                  Rhian Asebedo S

## 2022-08-30 NOTE — TOC Transition Note (Signed)
Transition of Care [facility]) - CM/SW Discharge Note  Patient Details  Name: Patrick Moyer MRN: [id number] Date of Birth: 07-06-1951  [facility] CM/SW Contact:  Sherie Don, LCSW Phone Number: 08/30/2022, 11:24 AM  Clinical Narrative: Patient is expected to discharge home after working with PT. CSW met with patient to confirm discharge plan and needs. Patient will go home with OPPT at [facility]. Patient will need a rolling Schmeling and 3N1, which were delivered to patient's room by [employer]. TOC signing off.    Final next level of care: OP Rehab Barriers to Discharge: No Barriers Identified  Patient Goals and CMS Choice Patient states their goals for this hospitalization and ongoing recovery are:: Discharge home with [place] CMS Medicare.gov Compare Post Acute Care list provided to:: Patient Choice offered to / list presented to : Patient  Discharge Plan and Services         DME Arranged: 3-N-1, [place] rolling DME Agency: [employer] Representative spoke with at DME Agency: Prearranged in orthopedist's office  Readmission Risk Interventions     No data to display

## 2022-08-30 NOTE — Plan of Care (Signed)
  Problem: Clinical Measurements: Goal: Postoperative complications will be avoided or minimized Outcome: Progressing   Problem: Pain Management: Goal: Pain level will decrease with appropriate interventions Outcome: Progressing   Problem: Coping: Goal: Level of anxiety will decrease Outcome: Progressing   Problem: Safety: Goal: Ability to remain free from injury will improve Outcome: Progressing

## 2022-08-30 NOTE — Plan of Care (Signed)
Discharge instructions given to the patient including medications and follow up.  

## 2022-08-30 NOTE — Discharge Summary (Signed)
Patient ID: Patrick Moyer MRN: [id number] DOB/AGE: 70-31-1952 70 y.o.  Admit date: 08/29/2022 Discharge date: 08/30/2022  Admission Diagnoses:Left knee osteoarthritis  Discharge Diagnoses:  Principal Problem:   Status post left knee replacement Active Problems:   S/P total knee arthroplasty, left   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 62   per pt treated with INH for one year and cxr's since have been normal   HSV-1 infection    Marfanoid habitus    per pt has wing span is longer than his height, effects eyes (iris), hyperflexibility of ankle and hands,  AAA   Mild CAD    cardiologist--  dr Delton See---  CTA 10-22-2019   Non-celiac gluten sensitivity    Numbness and tingling in both hands    per pt intermittantly due to cervical spine   OSA on CPAP    auto 8-10,  pt stated uses every night   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 We had intended to perform a partial knee replacement, but upon evaluation of the femoral trochlea he had  grade 4 changes with irregular cartilage and full-thickness chondral loss on the patella and femoral trochlea.  Therefore we converted to a total knee replacement. He tolerated procedure well. He was drowsy when working with PT a few hours after surgery. It was decided he needed additional PT before discharging home. He was kept for monitoring overnight for pain control, medical monitoring postop, and continued PT.  He was found to be stable for DC home the morning after surgery.  Patient was instructed on specific activity restrictions and all questions were answered.  Consults: PT  Significant Diagnostic Studies: No additional pertinent studies  Treatments: Surgery  Discharge Exam: General: sitting up in hospital bed, NAD Cardiac: regular rate Pulmonary: no increased work of breathing Left lower extremity: dressing removed. Incision pristine. New dressings placed.  No signs of drainage. Swelling of the left knee as to be expected. Compartments soft and compressible. intact EHL/TA/GSC. Warm well perfused foot.   Disposition: Discharge disposition: 01-Home or Self Care       Discharge Instructions     Call MD for:  redness, tenderness, or signs of infection (pain, swelling, redness, odor or green/yellow discharge around incision site)   Complete by: As directed    Call MD for:  severe uncontrolled pain   Complete by: As directed    Call MD for:  temperature >100.4   Complete by: As directed    Diet - low sodium heart healthy   Complete by: As directed       Allergies as of 08/30/2022       Reactions   Other    Adhesive, Caffene-abd pain   Cialis [tadalafil]    Caused acute angle glaucoma   Crestor [rosuvastatin] Other (See Comments)   Causes joint aches   Soy Protein Aris [place] Oil]    Gastrointestinal upset   Viagra [sildenafil]    Caused acute angle glaucoma        Medication List     TAKE these medications    acetaminophen 500 MG tablet Commonly known as:  TYLENOL Take 500 mg by mouth at bedtime.   aspirin EC 325 MG tablet Take 1 tablet (325 mg total) by mouth 2 (two) times daily. For 30 days after surgery   B COMPLEX 100 PO Take 1 tablet by mouth in the morning and at bedtime.   celecoxib 200 MG capsule Commonly known as: CELEBREX Take 200 mg by mouth 2 (two) times daily.   cholecalciferol 1000 units tablet Commonly known as: VITAMIN D Take 1,000 Units by mouth in the morning and at bedtime.   diclofenac Sodium 1 % Gel Commonly known as: VOLTAREN Apply 1 Application topically 3 (three) times daily as needed (pain).   fexofenadine 180 MG tablet Commonly known as: ALLEGRA Take 180 mg by mouth daily.   FIBER PO Take 1 each by mouth daily.   Fish Oil 1000 MG Caps Take 1 capsule (1,000 mg total) by mouth in the morning and at bedtime. What changed: when to take this   fluticasone 50 MCG/ACT nasal spray Commonly known as: FLONASE Place 1 spray into both nostrils in the morning and at bedtime.   folic acid 1 MG tablet Commonly known as: FOLVITE Take 1 mg by mouth daily.   gabapentin 600 MG tablet Commonly known as: NEURONTIN Take 900 mg by mouth 3 (three) times daily.   glucosamine-chondroitin 500-400 MG tablet Take 1 tablet by mouth daily.   guaiFENesin 600 MG 12 hr tablet Commonly known as: MUCINEX Take 1,200 mg by mouth 2 (two) times daily.   L-Lysine 500 MG Caps Take 1 capsule by mouth in the morning and at bedtime.   Lutein-Zeaxanthin 25-5 MG Caps Take 1 tablet by mouth daily.   montelukast 10 MG tablet Commonly known as: SINGULAIR Take 10 mg by mouth at bedtime.   omeprazole 20 MG capsule Commonly known as: PRILOSEC Take 20 mg by mouth daily.   ondansetron 4 MG tablet Commonly known as: Zofran Take 1 tablet (4 mg total) by mouth every 8 (eight) hours as needed for nausea or vomiting.   oxyCODONE 5 MG immediate release tablet Commonly known as: Roxicodone Take 1 tablet (5 mg total) by mouth every 4  (four) hours as needed for severe pain.   Potassium Gluconate 550 (90 K) MG Tabs Take 1 tablet by mouth as directed. 5 tablets weekly. What changed:  how much to take when to take this reasons to take this additional instructions   ROLAIDS PO Take by mouth as needed.   rosuvastatin 5 MG tablet Commonly known as: CRESTOR Take 1 tablet by mouth 2-3 times per week What changed:  how much to take how to take this when to take this additional instructions   sennosides-docusate sodium 8.6-50 MG tablet Commonly known as: SENOKOT-S Take 2 tablets by mouth daily.   Sodium Fluoride  5000 PPM 1.1 % Gel dental gel Generic drug: sodium fluoride Take 1 application by mouth daily.   Turmeric 500 MG Caps Take 500 mg by mouth daily.   valACYclovir 1000 MG tablet Commonly known as: VALTREX Take 1,000 mg by mouth daily as needed (cold sore).   vitamin C 1000 MG tablet Take 1,000 mg by mouth daily.        Follow-up Information     Marchia Bond, MD. Go on 09/13/2022.   Specialty: Orthopedic Surgery Why: your appointment is scheduled for 8:30 Contact information: [address]  [address] [facility], [place]. Go on 08/31/2022.   Why: Your outpatient physical therapy is scheduled for Contact information: [facility] [place] [address] [phone]

## 2022-08-30 NOTE — Progress Notes (Signed)
   ORTHOPAEDIC PROGRESS NOTE  s/p Procedure(s): LEFT TOTAL KNEE ARTHROPLASTY on 9/5 with DR. Landau  SUBJECTIVE: Reports mild to moderate pain about operative site. He was groggy when working with PT yesterday. He feels much better this morning. There were some issues with his Neurontin last night. He spoke with the pharmacist and clarified his dosage during the night. This has now been corrected. No chest pain. No SOB. No nausea/vomiting. No other complaints.  OBJECTIVE: PE: General: sitting up in hospital bed, NAD Cardiac: regular rate Pulmonary: no increased work of breathing Left lower extremity: dressing removed. Incision pristine. New dressings placed.  No signs of drainage. Swelling of the left knee as to be expected. Compartments soft and compressible. intact EHL/TA/GSC. Warm well perfused foot.    Vitals:   08/30/22 0138 08/30/22 0602  BP: 113/65 134/73  Pulse: 60 (!) 57  Resp: 17 17  Temp: 97.8 F (36.6 C) 98 F (36.7 C)  SpO2: 97% 98%   Stable post-op images.   ASSESSMENT: Patrick Moyer is a 71 y.o. adult doing well postoperatively. POD#1  PLAN: Weightbearing: WBAT LLE Insicional and dressing care: Reinforce dressings as needed Orthopedic device(s): None Showering: post-op day #3 VTE prophylaxis: Aspirin 325 mg  Pain control: PRN pain medications, minimize narcotics as able Follow - up plan: 2 weeks in office with Dr. Dion Saucier Dispo: PT to see patient again today. He feels much better than he did after surgery yesterday. If he passes PT, patient to be discharged home. Patient is in agreement with this plan. All questions answered.  Contact information: Dr. Teryl Lucy, Alfonse Alpers PA-C, After hours and holidays please check [email or web address] for group call information for [facility]  Alfonse Alpers, PA-C 08/30/2022

## 2022-09-04 ENCOUNTER — Other Ambulatory Visit: Payer: Self-pay | Admitting: Student in an Organized Health Care Education/Training Program

## 2022-09-04 DIAGNOSIS — Z96652 Presence of left artificial knee joint: Secondary | ICD-10-CM | POA: Diagnosis not present

## 2022-09-04 DIAGNOSIS — R262 Difficulty in walking, not elsewhere classified: Secondary | ICD-10-CM | POA: Diagnosis not present

## 2022-09-04 DIAGNOSIS — M6281 Muscle weakness (generalized): Secondary | ICD-10-CM | POA: Diagnosis not present

## 2022-09-06 ENCOUNTER — Ambulatory Visit: Payer: Medicare PPO | Admitting: Family Medicine

## 2022-09-06 DIAGNOSIS — M6281 Muscle weakness (generalized): Secondary | ICD-10-CM | POA: Diagnosis not present

## 2022-09-06 DIAGNOSIS — B351 Tinea unguium: Secondary | ICD-10-CM | POA: Diagnosis not present

## 2022-09-06 DIAGNOSIS — R262 Difficulty in walking, not elsewhere classified: Secondary | ICD-10-CM | POA: Diagnosis not present

## 2022-09-06 DIAGNOSIS — Z96652 Presence of left artificial knee joint: Secondary | ICD-10-CM | POA: Diagnosis not present

## 2022-09-11 DIAGNOSIS — Z96652 Presence of left artificial knee joint: Secondary | ICD-10-CM | POA: Diagnosis not present

## 2022-09-11 DIAGNOSIS — R262 Difficulty in walking, not elsewhere classified: Secondary | ICD-10-CM | POA: Diagnosis not present

## 2022-09-11 DIAGNOSIS — M6281 Muscle weakness (generalized): Secondary | ICD-10-CM | POA: Diagnosis not present

## 2022-09-11 DIAGNOSIS — M5416 Radiculopathy, lumbar region: Secondary | ICD-10-CM | POA: Diagnosis not present

## 2022-09-13 DIAGNOSIS — R262 Difficulty in walking, not elsewhere classified: Secondary | ICD-10-CM | POA: Diagnosis not present

## 2022-09-13 DIAGNOSIS — Z96652 Presence of left artificial knee joint: Secondary | ICD-10-CM | POA: Diagnosis not present

## 2022-09-13 DIAGNOSIS — M6281 Muscle weakness (generalized): Secondary | ICD-10-CM | POA: Diagnosis not present

## 2022-09-14 DIAGNOSIS — Z79899 Other long term (current) drug therapy: Secondary | ICD-10-CM | POA: Diagnosis not present

## 2022-09-14 DIAGNOSIS — Z791 Long term (current) use of non-steroidal anti-inflammatories (NSAID): Secondary | ICD-10-CM | POA: Diagnosis not present

## 2022-09-14 DIAGNOSIS — M0609 Rheumatoid arthritis without rheumatoid factor, multiple sites: Secondary | ICD-10-CM | POA: Diagnosis not present

## 2022-09-14 DIAGNOSIS — M1990 Unspecified osteoarthritis, unspecified site: Secondary | ICD-10-CM | POA: Diagnosis not present

## 2022-09-17 DIAGNOSIS — Z96652 Presence of left artificial knee joint: Secondary | ICD-10-CM | POA: Diagnosis not present

## 2022-09-20 DIAGNOSIS — R262 Difficulty in walking, not elsewhere classified: Secondary | ICD-10-CM | POA: Diagnosis not present

## 2022-09-20 DIAGNOSIS — Z96652 Presence of left artificial knee joint: Secondary | ICD-10-CM | POA: Diagnosis not present

## 2022-09-20 DIAGNOSIS — M6281 Muscle weakness (generalized): Secondary | ICD-10-CM | POA: Diagnosis not present

## 2022-09-27 DIAGNOSIS — M6281 Muscle weakness (generalized): Secondary | ICD-10-CM | POA: Diagnosis not present

## 2022-09-27 DIAGNOSIS — Z96652 Presence of left artificial knee joint: Secondary | ICD-10-CM | POA: Diagnosis not present

## 2022-09-27 DIAGNOSIS — R262 Difficulty in walking, not elsewhere classified: Secondary | ICD-10-CM | POA: Diagnosis not present

## 2022-10-02 DIAGNOSIS — Z96652 Presence of left artificial knee joint: Secondary | ICD-10-CM | POA: Diagnosis not present

## 2022-10-02 DIAGNOSIS — M6281 Muscle weakness (generalized): Secondary | ICD-10-CM | POA: Diagnosis not present

## 2022-10-02 DIAGNOSIS — R262 Difficulty in walking, not elsewhere classified: Secondary | ICD-10-CM | POA: Diagnosis not present

## 2022-10-04 ENCOUNTER — Ambulatory Visit: Payer: Medicare PPO | Admitting: Student in an Organized Health Care Education/Training Program

## 2022-10-04 ENCOUNTER — Other Ambulatory Visit: Payer: Self-pay | Admitting: Neurosurgery

## 2022-10-04 DIAGNOSIS — M545 Low back pain, unspecified: Secondary | ICD-10-CM | POA: Diagnosis not present

## 2022-10-04 DIAGNOSIS — M79605 Pain in left leg: Secondary | ICD-10-CM | POA: Diagnosis not present

## 2022-10-04 DIAGNOSIS — M5416 Radiculopathy, lumbar region: Secondary | ICD-10-CM | POA: Diagnosis not present

## 2022-10-06 DIAGNOSIS — M6281 Muscle weakness (generalized): Secondary | ICD-10-CM | POA: Diagnosis not present

## 2022-10-06 DIAGNOSIS — R262 Difficulty in walking, not elsewhere classified: Secondary | ICD-10-CM | POA: Diagnosis not present

## 2022-10-06 DIAGNOSIS — Z96652 Presence of left artificial knee joint: Secondary | ICD-10-CM | POA: Diagnosis not present

## 2022-10-10 DIAGNOSIS — B351 Tinea unguium: Secondary | ICD-10-CM | POA: Diagnosis not present

## 2022-10-10 DIAGNOSIS — M2142 Flat foot [pes planus] (acquired), left foot: Secondary | ICD-10-CM | POA: Diagnosis not present

## 2022-10-10 DIAGNOSIS — M19072 Primary osteoarthritis, left ankle and foot: Secondary | ICD-10-CM | POA: Diagnosis not present

## 2022-10-10 DIAGNOSIS — R2689 Other abnormalities of gait and mobility: Secondary | ICD-10-CM | POA: Diagnosis not present

## 2022-10-10 DIAGNOSIS — L603 Nail dystrophy: Secondary | ICD-10-CM | POA: Diagnosis not present

## 2022-10-10 DIAGNOSIS — M76822 Posterior tibial tendinitis, left leg: Secondary | ICD-10-CM | POA: Diagnosis not present

## 2022-10-10 DIAGNOSIS — M19071 Primary osteoarthritis, right ankle and foot: Secondary | ICD-10-CM | POA: Diagnosis not present

## 2022-10-12 DIAGNOSIS — R262 Difficulty in walking, not elsewhere classified: Secondary | ICD-10-CM | POA: Diagnosis not present

## 2022-10-12 DIAGNOSIS — M6281 Muscle weakness (generalized): Secondary | ICD-10-CM | POA: Diagnosis not present

## 2022-10-12 DIAGNOSIS — Z96652 Presence of left artificial knee joint: Secondary | ICD-10-CM | POA: Diagnosis not present

## 2022-10-16 ENCOUNTER — Ambulatory Visit
Admission: RE | Admit: 2022-10-16 | Discharge: 2022-10-16 | Disposition: A | Discharge status: Home / Self Care | Payer: Medicare PPO | Source: Ambulatory Visit | Attending: Neurosurgery | Admitting: Neurosurgery

## 2022-10-16 ENCOUNTER — Other Ambulatory Visit: Payer: Self-pay | Admitting: Neurosurgery

## 2022-10-16 DIAGNOSIS — M545 Low back pain, unspecified: Secondary | ICD-10-CM | POA: Diagnosis not present

## 2022-10-16 DIAGNOSIS — R262 Difficulty in walking, not elsewhere classified: Secondary | ICD-10-CM | POA: Diagnosis not present

## 2022-10-16 DIAGNOSIS — M5416 Radiculopathy, lumbar region: Secondary | ICD-10-CM

## 2022-10-16 DIAGNOSIS — M5136 Other intervertebral disc degeneration, lumbar region: Secondary | ICD-10-CM | POA: Diagnosis not present

## 2022-10-16 DIAGNOSIS — M6281 Muscle weakness (generalized): Secondary | ICD-10-CM | POA: Diagnosis not present

## 2022-10-16 DIAGNOSIS — M48061 Spinal stenosis, lumbar region without neurogenic claudication: Secondary | ICD-10-CM | POA: Diagnosis not present

## 2022-10-16 DIAGNOSIS — M79605 Pain in left leg: Secondary | ICD-10-CM | POA: Diagnosis not present

## 2022-10-16 DIAGNOSIS — Z96652 Presence of left artificial knee joint: Secondary | ICD-10-CM | POA: Diagnosis not present

## 2022-10-18 ENCOUNTER — Ambulatory Visit
Payer: Medicare PPO | Attending: Student in an Organized Health Care Education/Training Program | Admitting: Student in an Organized Health Care Education/Training Program

## 2022-10-18 ENCOUNTER — Ambulatory Visit
Admission: RE | Admit: 2022-10-18 | Discharge: 2022-10-18 | Disposition: A | Discharge status: Home / Self Care | Payer: Medicare PPO | Source: Ambulatory Visit | Attending: Student in an Organized Health Care Education/Training Program | Admitting: Student in an Organized Health Care Education/Training Program

## 2022-10-18 ENCOUNTER — Encounter: Payer: Self-pay | Admitting: Student in an Organized Health Care Education/Training Program

## 2022-10-18 VITALS — BP 149/96 | HR 70 | Temp 97.2°F | Resp 16 | Ht 74.0 in | Wt 203.0 lb

## 2022-10-18 DIAGNOSIS — G5702 Lesion of sciatic nerve, left lower limb: Secondary | ICD-10-CM | POA: Diagnosis not present

## 2022-10-18 MED ORDER — DEXAMETHASONE SODIUM PHOSPHATE 10 MG/ML IJ SOLN
10.0000 mg | Freq: Once | INTRAMUSCULAR | Status: AC
  Administered 2022-10-18: 10 mg
  Filled 2022-10-18: qty 1

## 2022-10-18 MED ORDER — IOHEXOL 180 MG/ML  SOLN
10.0000 mL | Freq: Once | INTRAMUSCULAR | Status: AC
  Administered 2022-10-18: 10 mL via INTRA_ARTICULAR
  Filled 2022-10-18: qty 20

## 2022-10-18 MED ORDER — ROPIVACAINE HCL 2 MG/ML IJ SOLN
4.0000 mL | Freq: Once | INTRAMUSCULAR | Status: AC
  Administered 2022-10-18: 4 mL via INTRA_ARTICULAR
  Filled 2022-10-18: qty 20

## 2022-10-18 NOTE — Progress Notes (Signed)
Safety precautions to be maintained throughout the outpatient stay will include: orient to surroundings, keep bed in low position, maintain call bell within reach at all times, provide assistance with transfer out of bed and ambulation.  

## 2022-10-18 NOTE — Progress Notes (Signed)
Okay PROVIDER NOTE: Information contained herein reflects review and annotations entered in association with encounter. Interpretation of such information and data should be left to medically-trained personnel. Information provided to patient can be located elsewhere in the medical record under "Patient Instructions". Document created using STT-dictation technology, any transcriptional errors that may result from process are unintentional.    Patient: Patrick Moyer  Service Category: Procedure  Provider: Gillis Santa, MD  DOB: [birth year]  DOS: 10/18/2022  Location: [facility]  MRN: [id number]  Setting: Ambulatory - outpatient  Referring Provider: Lawerance Cruel, MD  Type: Established Patient  Specialty: Interventional Pain Management  PCP: Lawerance Cruel, MD   Primary Reason for Visit: Interventional Pain Management Treatment. CC: Other (Left piriformis )   Procedure:          Anesthesia, Analgesia, Anxiolysis:  Type: Left piriformis trigger point injection #15  under fluoroscopic guidance Region: Inferior Lumbosacral Region Level: PIIS (Posterior Inferior Iliac Spine) 1 cm deep, 1 cm lateral and 1 cm inferior from posterior inferior fissure of SI joint. Laterality: Left-Side    Position: Prone           Indications: 1. Piriformis syndrome of left side        Pain Score: Pre-procedure: 5/10 Post-procedure: 0-No pain/10   Pre-op H&P Assessment:    Hodgson is a 71 y.o. (year old), adult patient, seen today for interventional treatment. Patrick Moyer "Ed"  has a past surgical history that includes Nasal septum surgery (1975); Wisdom tooth extraction (1975); Finger exploration (Left, 1995); Laparoscopic appendectomy (2010); Inguinal hernia repair (Left, 1978); Transurethral resection of prostate (N/A, 05/05/2020); left knee arthroscopy ; and Total knee arthroplasty (Left, 08/29/2022).   Lennartz has a current medication list which includes the following  prescription(s): acetaminophen, vitamin c, b complex vitamins, ca carbonate-mag hydroxide, celecoxib, cholecalciferol, diclofenac sodium, fexofenadine, fiber, fluticasone, folic acid, gabapentin, glucosamine-chondroitin, guaifenesin, l-lysine, lutein-zeaxanthin, methotrexate, montelukast, montelukast, fish oil, omeprazole, potassium gluconate, rosuvastatin, sodium fluoride 5000 ppm, turmeric, valacyclovir, aspirin ec, ondansetron, oxycodone, and sennosides-docusate sodium. Patrick Moyer "Ed"'s primarily concern today is the Other (Left piriformis )   Initial Vital Signs:  Pulse/HCG Rate: 70ECG Heart Rate: 68 Temp: (!) 97.2 F (36.2 C) Resp: 16 BP: 129/71 SpO2: 100 %  BMI: Estimated body mass index is 26.06 kg/m as calculated from the following:   Height as of this encounter: 6\' 2"  (1.88 m).   Weight as of this encounter: 203 lb (92.1 kg).  Risk Assessment: Allergies: Reviewed. Patrick Moyer "Ed" is allergic to other, cialis [tadalafil], crestor [rosuvastatin], soy protein [soybean oil], and viagra [sildenafil].  Allergy Precautions: None required Coagulopathies: Reviewed. None identified.  Blood-thinner therapy: None at this time Active Infection(s): Reviewed. None identified. Mr. Degaetano is afebrile  Site Confirmation:   Martis was asked to confirm the procedure and laterality before marking the site Procedure checklist: Completed Consent: Before the procedure and under the influence of no sedative(s), amnesic(s), or anxiolytics, the patient was informed of the treatment options, risks and possible complications. To fulfill our ethical and legal obligations, as recommended by the American Medical Association's Code of Ethics, I have informed the patient of my clinical impression; the nature and purpose of the treatment or procedure; the risks, benefits, and possible complications of the intervention; the alternatives, including doing nothing; the risk(s) and benefit(s) of the alternative  treatment(s) or procedure(s); and the risk(s) and benefit(s) of doing nothing. The patient was provided information about the general risks and possible  complications associated with the procedure. These may include, but are not limited to: failure to achieve desired goals, infection, bleeding, organ or nerve damage, allergic reactions, paralysis, and death. In addition, the patient was informed of those risks and complications associated to the procedure, such as failure to decrease pain; infection; bleeding; organ or nerve damage with subsequent damage to sensory, motor, and/or autonomic systems, resulting in permanent pain, numbness, and/or weakness of one or several areas of the body; allergic reactions; (i.e.: anaphylactic reaction); and/or death. Furthermore, the patient was informed of those risks and complications associated with the medications. These include, but are not limited to: allergic reactions (i.e.: anaphylactic or anaphylactoid reaction(s)); adrenal axis suppression; blood sugar elevation that in diabetics may result in ketoacidosis or comma; water retention that in patients with history of congestive heart failure may result in shortness of breath, pulmonary edema, and decompensation with resultant heart failure; weight gain; swelling or edema; medication-induced neural toxicity; particulate matter embolism and blood vessel occlusion with resultant organ, and/or nervous system infarction; and/or aseptic necrosis of one or more joints. Finally, the patient was informed that Medicine is not an exact science; therefore, there is also the possibility of unforeseen or unpredictable risks and/or possible complications that may result in a catastrophic outcome. The patient indicated having understood very clearly. We have given the patient no guarantees and we have made no promises. Enough time was given to the patient to ask questions, all of which were answered to the patient's satisfaction.    Donmoyer has indicated that [employer] "Ed" wanted to continue with the procedure. Attestation: I, the ordering provider, attest that I have discussed with the patient the benefits, risks, side-effects, alternatives, likelihood of achieving goals, and potential problems during recovery for the procedure that I have provided informed consent. Date  Time: 10/18/2022  9:30 AM  Pre-Procedure Preparation:  Monitoring: As per clinic protocol. Respiration, ETCO2, SpO2, BP, heart rate and rhythm monitor placed and checked for adequate function Safety Precautions: Patient was assessed for positional comfort and pressure points before starting the procedure. Time-out: I initiated and conducted the "Time-out" before starting the procedure, as per protocol. The patient was asked to participate by confirming the accuracy of the "Time Out" information. Verification of the correct person, site, and procedure were performed and confirmed by me, the nursing staff, and the patient. "Time-out" conducted as per Joint Commission's Universal Protocol (UP.01.01.01). Time: 0954  Description of Procedure:          Target Area: 1 cm deep, 1 cm lateral, 1 cm inferior to the Inferior, posterior, aspect of the sacroiliac fissure Approach: Posterior, paraspinal, ipsilateral approach. Area Prepped: Entire Lower Lumbosacral Region DuraPrep (Iodine Povacrylex [0.7% available iodine] and Isopropyl Alcohol, 74% w/w) Safety Precautions: Aspiration looking for blood return was conducted prior to all injections. At no point did we inject any substances, as a needle was being advanced. No attempts were made at seeking any paresthesias. Safe injection practices and needle disposal techniques used. Medications properly checked for expiration dates. SDV (single dose vial) medications used. Description of the Procedure: Protocol guidelines were followed. The patient was placed in position over the procedure table. The target area was  identified and the area prepped in the usual manner. Skin & deeper tissues infiltrated with local anesthetic. Appropriate amount of time allowed to pass for local anesthetics to take effect. The procedure needle was advanced under fluoroscopic guidance into the sacroiliac joint until a firm endpoint was obtained. Proper needle placement  secured. Negative aspiration confirmed. Solution injected in intermittent fashion, asking for systemic symptoms every 0.5cc of injectate. The needles were then removed and the area cleansed, making sure to leave some of the prepping solution back to take advantage of its long term bactericidal properties. Vitals:   10/18/22 0933 10/18/22 0952 10/18/22 0957  BP: 129/71 (!) 147/94 (!) 149/96  Pulse: 70    Resp: 16 18 16   Temp: (!) 97.2 F (36.2 C)    TempSrc: Temporal    SpO2: 100% 100% 100%  Weight: 203 lb (92.1 kg)    Height: 6\' 2"  (1.88 m)            Start Time: 0954 hrs. End Time: 0957 hrs. Materials:  Needle(s) Type: Spinal Needle Gauge: 25G Length: 3.5-in Medication(s): Please see orders for medications and dosing details.  6cc solution made of 5cc of 0.2% ropivacaine, 1 cc of Decadron 10 mg/cc.  Injected into the left piriformis muscle after contrast confirmation highlighting piriformis muscle striations.  No radiating pain down left leg during injection. Imaging Guidance (Non-Spinal):          Type of Imaging Technique: Fluoroscopy Guidance (Non-Spinal) Indication(s): Assistance in needle guidance and placement for procedures requiring needle placement in or near specific anatomical locations not easily accessible without such assistance. Exposure Time: Please see nurses notes. Contrast: Before injecting any contrast, we confirmed that the patient did not have an allergy to iodine, shellfish, or radiological contrast. Once satisfactory needle placement was completed at the desired level, radiological contrast was injected. Contrast injected under  live fluoroscopy. No contrast complications. See chart for type and volume of contrast used. Fluoroscopic Guidance: I was personally present during the use of fluoroscopy. "Tunnel Vision Technique" used to obtain the best possible view of the target area. Parallax error corrected before commencing the procedure. "Direction-depth-direction" technique used to introduce the needle under continuous pulsed fluoroscopy. Once target was reached, antero-posterior, oblique, and lateral fluoroscopic projection used confirm needle placement in all planes. Images permanently stored in EMR. Interpretation: I personally interpreted the imaging intraoperatively. Adequate needle placement confirmed in multiple planes. Appropriate spread of contrast into desired area was observed. No evidence of afferent or efferent intravascular uptake. Permanent images saved into the patient's record.  Post-operative Assessment:  Post-procedure Vital Signs:  Pulse/HCG Rate: 7066 Temp: (!) 97.2 F (36.2 C) Resp: 16 BP: (!) 149/96 SpO2: 100 %  EBL: None  Complications: No immediate post-treatment complications observed by team, or reported by patient.  Note: The patient tolerated the entire procedure well. A repeat set of vitals were taken after the procedure and the patient was kept under observation following institutional policy, for this type of procedure. Post-procedural neurological assessment was performed, showing return to baseline, prior to discharge. The patient was provided with post-procedure discharge instructions, including a section on how to identify potential problems. Should any problems arise concerning this procedure, the patient was given instructions to immediately contact us, at any time, without hesitation. In any case, we plan to contact the patient by telephone for a follow-up status report regarding this interventional procedure.  Comments:  No additional relevant information.  Plan of Care  Orders:   Orders Placed This Encounter  Procedures   DG PAIN [facility] C-ARM 1-60 MIN NO REPORT    Intraoperative interpretation by procedural physician at [facility].    Standing Status:   Standing    Number of Occurrences:   1    Order Specific Question:   Reason for  exam:    Answer:   Assistance in needle guidance and placement for procedures requiring needle placement in or near specific anatomical locations not easily accessible without such assistance.      Medications ordered for procedure: Meds ordered this encounter  Medications   iohexol (OMNIPAQUE) 180 MG/ML injection 10 mL    Must be Myelogram-compatible. If not available, you may substitute with a water-soluble, non-ionic, hypoallergenic, myelogram-compatible radiological contrast medium.   dexamethasone (DECADRON) injection 10 mg   ropivacaine (PF) 2 mg/mL (0.2%) (NAROPIN) injection 4 mL      Medications administered: We administered iohexol, dexamethasone, and ropivacaine (PF) 2 mg/mL (0.2%).  See the medical record for exact dosing, route, and time of administration.  Follow-up plan:   Return for PRN repeat piriformis TPI.     Recent Visits No visits were found meeting these conditions. Showing recent visits within past 90 days and meeting all other requirements Today's Visits Date Type Provider Dept  10/18/22 Procedure visit Gillis Santa, MD [facility]  Showing today's visits and meeting all other requirements Future Appointments No visits were found meeting these conditions. Showing future appointments within next 90 days and meeting all other requirements  Disposition: Discharge home  Discharge (Date  Time): 10/18/2022; 1010 hrs.   Primary Care Physician: Lawerance Cruel, MD Location: [facility] Note by: Gillis Santa, MD Date: 10/18/2022; Time: 10:08 AM  Disclaimer:  Medicine is not an exact science. The only guarantee in medicine is that nothing is  guaranteed. It is important to note that the decision to proceed with this intervention was based on the information collected from the patient. The Data and conclusions were drawn from the patient's questionnaire, the interview, and the physical examination. Because the information was provided in large part by the patient, it cannot be guaranteed that it has not been purposely or unconsciously manipulated. Every effort has been made to obtain as much relevant data as possible for this evaluation. It is important to note that the conclusions that lead to this procedure are derived in large part from the available data. Always take into account that the treatment will also be dependent on availability of resources and existing treatment guidelines, considered by other Pain Management Practitioners as being common knowledge and practice, at the time of the intervention. For Medico-Legal purposes, it is also important to point out that variation in procedural techniques and pharmacological choices are the acceptable norm. The indications, contraindications, technique, and results of the above procedure should only be interpreted and judged by a Board-Certified Interventional Pain Specialist with extensive familiarity and expertise in the same exact procedure and technique.

## 2022-10-18 NOTE — Patient Instructions (Addendum)
____________________________________________________________________________________________  Post-Procedure Discharge Instructions  Instructions: Apply ice:  Purpose: This will minimize any swelling and discomfort after procedure.  When: Day of procedure, as soon as you get home. How: Fill a plastic sandwich bag with crushed ice. Cover it with a small towel and apply to injection site. How long: (15 min on, 15 min off) Apply for 15 minutes then remove x 15 minutes.  Repeat sequence on day of procedure, until you go to bed. Apply heat:  Purpose: To treat any soreness and discomfort from the procedure. When: Starting the next day after the procedure. How: Apply heat to procedure site starting the day following the procedure. How long: May continue to repeat daily, until discomfort goes away. Food intake: Start with clear liquids (like water) and advance to regular food, as tolerated.  Physical activities: Keep activities to a minimum for the first 8 hours after the procedure. After that, then as tolerated. Driving: If you have received any sedation, be responsible and do not drive. You are not allowed to drive for 24 hours after having sedation. Blood thinner: (Applies only to those taking blood thinners) You may restart your blood thinner 6 hours after your procedure. Insulin: (Applies only to Diabetic patients taking insulin) As soon as you can eat, you may resume your normal dosing schedule. Infection prevention: Keep procedure site clean and dry. Shower daily and clean area with soap and water. Post-procedure Pain Diary: Extremely important that this be done correctly and accurately. Recorded information will be used to determine the next step in treatment. For the purpose of accuracy, follow these rules: Evaluate only the area treated. Do not report or include pain from an untreated area. For the purpose of this evaluation, ignore all other areas of pain, except for the treated area. After  your procedure, avoid taking a long nap and attempting to complete the pain diary after you wake up. Instead, set your alarm clock to go off every hour, on the hour, for the initial 8 hours after the procedure. Document the duration of the numbing medicine, and the relief you are getting from it. Do not go to sleep and attempt to complete it later. It will not be accurate. If you received sedation, it is likely that you were given a medication that may cause amnesia. Because of this, completing the diary at a later time may cause the information to be inaccurate. This information is needed to plan your care. Follow-up appointment: Keep your post-procedure follow-up evaluation appointment after the procedure (usually 2 weeks for most procedures, 6 weeks for radiofrequencies). DO NOT FORGET to bring you pain diary with you.   Expect: (What should I expect to see with my procedure?) From numbing medicine (AKA: Local Anesthetics): Numbness or decrease in pain. You may also experience some weakness, which if present, could last for the duration of the local anesthetic. Onset: Full effect within 15 minutes of injected. Duration: It will depend on the type of local anesthetic used. On the average, 1 to 8 hours.  From steroids (Applies only if steroids were used): Decrease in swelling or inflammation. Once inflammation is improved, relief of the pain will follow. Onset of benefits: Depends on the amount of swelling present. The more swelling, the longer it will take for the benefits to be seen. In some cases, up to 10 days. Duration: Steroids will stay in the system x 2 weeks. Duration of benefits will depend on multiple posibilities including persistent irritating factors. Side-effects: If present, they  may typically last 2 weeks (the duration of the steroids). Frequent: Cramps (if they occur, drink Gatorade and take over-the-counter Magnesium 450-500 mg once to twice a day); water retention with temporary  weight gain; increases in blood sugar; decreased immune system response; increased appetite. Occasional: Facial flushing (red, warm cheeks); mood swings; menstrual changes. Uncommon: Long-term decrease or suppression of natural hormones; bone thinning. (These are more common with higher doses or more frequent use. This is why we prefer that our patients avoid having any injection therapies in other practices.)  Very Rare: Severe mood changes; psychosis; aseptic necrosis. From procedure: Some discomfort is to be expected once the numbing medicine wears off. This should be minimal if ice and heat are applied as instructed.  Call if: (When should I call?) You experience numbness and weakness that gets worse with time, as opposed to wearing off. New onset bowel or bladder incontinence. (Applies only to procedures done in the spine)  Emergency Numbers: Durning business hours (Monday - Thursday, 8:00 AM - 4:00 PM) (Friday, 9:00 AM - 12:00 Noon): [phone] After hours: [phone] NOTE: If you are having a problem and are unable connect with, or to talk to a provider, then go to your nearest urgent care or emergency department. If the problem is serious and urgent, please call 911. ____________________________________________________________________________________________  Selective Nerve Root Block Patient Information  Description: Specific nerve roots exit the spinal canal and these nerves can be compressed and inflamed by a bulging disc and bone spurs.  By injecting steroids on the nerve root, we can potentially decrease the inflammation surrounding these nerves, which often leads to decreased pain.  Also, by injecting local anesthesia on the nerve root, this can provide [place] helpful information to give to your referring doctor if it decreases your pain.  Selective nerve root blocks can be done along the spine from the neck to the low back depending on the location of your pain.   After numbing  the skin with local anesthesia, a small needle is passed to the nerve root and the position of the needle is verified using x-ray pictures.  After the needle is in correct position, we then deposit the medication.  You may experience a pressure sensation while this is being done.  The entire block usually lasts less than 15 minutes.  Conditions that may be treated with selective nerve root blocks: Low back and leg pain Spinal stenosis Diagnostic block prior to potential surgery Neck and arm pain Post laminectomy syndrome  Preparation for the injection:  Do not eat any solid food or dairy products within 8 hours of your appointment. You may drink clear liquids up to 3 hours before an appointment.  Clear liquids include water, black coffee, juice or soda.  No milk or cream please. You may take your regular medications, including pain medications, with a sip of water before your appointment.  Diabetics should hold regular insulin (if taken separately) and take 1/2 normal NPH dose the morning of the procedure.  Carry some sugar containing items with you to your appointment. A driver must accompany you and be prepared to drive you home after your procedure. Bring all your current medications with you. An IV may be inserted and sedation may be given at the discretion of the physician. A blood pressure cuff, EKG, and other monitors will often be applied during the procedure.  Some patients may need to have extra oxygen administered for a short period. You will be asked to provide  medical information, including allergies, prior to the procedure.  We must know immediately if you are taking blood  Thinners (like Coumadin) or if you are allergic to IV iodine contrast (dye).  Possible side-effects: All are usually temporary Bleeding from needle site Light headedness Numbness and tingling Decreased blood pressure Weakness in arms/legs Pressure sensation in back/neck Pain at injection site (several  days)  Possible complications: All are extremely rare Infection Nerve injury Spinal headache (a headache wore with upright position)  Call if you experience: Fever/chills associated with headache or increased back/neck pain Headache worsened by an upright position New onset weakness or numbness of an extremity below the injection site Hives or difficulty breathing (go to the emergency room) Inflammation or drainage at the injection site(s) Severe back/neck pain greater than usual New symptoms which are concerning to you  Please note:  Although the local anesthetic injected can often make your back or neck feel good for several hours after the injection the pain will likely return.  It takes 3-5 days for steroids to work on the nerve root. You may not notice any pain relief for at least one week.  If effective, we will often do a series of 3 injections spaced 3-6 weeks apart to maximally decrease your pain.    If you have any questions, please call [phone] [facility]

## 2022-10-23 DIAGNOSIS — M6281 Muscle weakness (generalized): Secondary | ICD-10-CM | POA: Diagnosis not present

## 2022-10-23 DIAGNOSIS — Z96652 Presence of left artificial knee joint: Secondary | ICD-10-CM | POA: Diagnosis not present

## 2022-10-23 DIAGNOSIS — R262 Difficulty in walking, not elsewhere classified: Secondary | ICD-10-CM | POA: Diagnosis not present

## 2022-10-25 DIAGNOSIS — Z96652 Presence of left artificial knee joint: Secondary | ICD-10-CM | POA: Diagnosis not present

## 2022-10-25 DIAGNOSIS — M6281 Muscle weakness (generalized): Secondary | ICD-10-CM | POA: Diagnosis not present

## 2022-10-25 DIAGNOSIS — R262 Difficulty in walking, not elsewhere classified: Secondary | ICD-10-CM | POA: Diagnosis not present

## 2022-10-30 DIAGNOSIS — M48061 Spinal stenosis, lumbar region without neurogenic claudication: Secondary | ICD-10-CM | POA: Diagnosis not present

## 2022-10-30 DIAGNOSIS — M5416 Radiculopathy, lumbar region: Secondary | ICD-10-CM | POA: Diagnosis not present

## 2022-10-30 DIAGNOSIS — M5136 Other intervertebral disc degeneration, lumbar region: Secondary | ICD-10-CM | POA: Diagnosis not present

## 2022-10-30 DIAGNOSIS — G629 Polyneuropathy, unspecified: Secondary | ICD-10-CM | POA: Diagnosis not present

## 2022-10-30 DIAGNOSIS — M5116 Intervertebral disc disorders with radiculopathy, lumbar region: Secondary | ICD-10-CM | POA: Diagnosis not present

## 2022-11-02 ENCOUNTER — Telehealth: Payer: Self-pay | Admitting: Student in an Organized Health Care Education/Training Program

## 2022-11-02 ENCOUNTER — Other Ambulatory Visit: Payer: Self-pay | Admitting: *Deleted

## 2022-11-02 NOTE — Telephone Encounter (Signed)
Rx request sent to Dr. Lateef 

## 2022-11-02 NOTE — Telephone Encounter (Signed)
PT stated that Dr. Cherylann Ratel had send him in an prescription for 600mg  Gabapentin. PT stated that the last time he got prescription was on 05-03-22. PT stated that the bottle stated no refilled. PT will like to know if doctor will send in another prescription for the Gabapentin 600 mg. Please give patient a call. Thanks

## 2022-11-03 ENCOUNTER — Other Ambulatory Visit: Payer: Self-pay | Admitting: *Deleted

## 2022-11-04 ENCOUNTER — Other Ambulatory Visit: Payer: Self-pay | Admitting: Student in an Organized Health Care Education/Training Program

## 2022-11-07 MED ORDER — GABAPENTIN 600 MG PO TABS: 900.0000 mg | ORAL_TABLET | Freq: Three times a day (TID) | ORAL | 0 refills | Status: DC

## 2022-11-15 ENCOUNTER — Telehealth: Payer: Self-pay | Admitting: Cardiology

## 2022-11-15 NOTE — Progress Notes (Signed)
[facility] [address] [place] [address] Phone: [phone] Subjective:   [facility], am serving as a scribe for Dr. Antoine Moyer.  I'm seeing this patient by the request  of:  Patrick Floro, MD  CC: Back and leg pain follow-up  [id number]  08/21/2022 Given brace on the wrist again.  Hypermobility, hopefully help with swimming Discussed with patient about physical therapy which patient was encouraged to do and referral placed today as well.  Follow-up with me again in 3 months  Patient has made great improvement already at this point.  We considered the Cymbalta and the osteopathic manipulation but patient is doing well with the conservative therapy.  We will continue to consider this if necessary.  Patient has noticed that decreasing some of his other activity as well is being more active though has helped overall.  I do not think we need to make any other large changes and follow-up again in 3 months.   Update 11/22/2022 Patrick Moyer is a 71 y.o. adult coming in with complaint of back and leg pain. Physical therapy has been helpful. Pain is 87% better. Has been moving boxes and was able to do so without pain.   Spoke with neurosurgery recently.   Seeing cardiology today for possible a-fib as he recently had chest discomfort, seems to be more at night.  Patient is going to be seeing a cardiologist over read later today.  At the moment not having any discomfort.           Past Medical History:  Diagnosis Date   ALLERGIC RHINITIS    Arthritis pain of wrist    Benign localized prostatic hyperplasia with lower urinary tract symptoms (LUTS)    Diverticulosis    ED (erectile dysfunction)    Environmental and seasonal allergies    Family history of Patrick Moyer    father, paternal uncle, and brother   GERD (gastroesophageal reflux disease)    High cholesterol    History of closed head injury    04-28-2020 per pt yrs  ago w/ no loc, residual mild memory loss which has resolved   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 5   per pt treated with INH for one year and cxr's since have been normal   HSV-1 infection    Marfanoid habitus    per pt has wing span is longer than his height, effects eyes (iris), hyperflexibility of ankle and hands,  AAA   Mild CAD    cardiologist--  dr Patrick Moyer---  CTA 10-22-2019   Non-celiac gluten sensitivity    Numbness and tingling in both hands    per pt intermittantly due to cervical spine   OSA on CPAP    auto 8-10,  pt stated uses every night   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)   Pulmonary nodules    per pt followed by pcp (CTA and CT chest 10-22-2019 , stable)   Thoracic ascending aortic aneurysm [facility])    cardiologist--- dr Patrick Moyer--- CTA 10-22-2019  ascending aorta 14mm and aortic root 26mm   Wears glasses    Past Surgical History:  Procedure Laterality Date   FINGER EXPLORATION Left 1995   left middle finger digital nerve repair   INGUINAL HERNIA REPAIR Left 1978   LAPAROSCOPIC APPENDECTOMY  2010   left knee arthroscopy      NASAL SEPTUM SURGERY  1975  TOTAL KNEE ARTHROPLASTY Left 08/29/2022   Procedure: UNICOMPARTMENTAL KNEE converted to a total knee;  Surgeon: Marchia Bond, MD;  Location: [facility];  Service: Orthopedics;  Laterality: Left;  with block   TRANSURETHRAL RESECTION OF PROSTATE N/A 05/05/2020   Procedure: TRANSURETHRAL RESECTION OF THE PROSTATE (TURP), BIPOLAR;  Surgeon: Ceasar Mons, MD;  Location: [facility];  Service: Urology;  Laterality: N/A;   WISDOM TOOTH EXTRACTION  1975   Social History   Socioeconomic History   Marital status: Married    Spouse name: Not on file   Number of children: 2   Years of education: Not on file   Highest education level: Not on file  Occupational History   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    Employer: [employer]  Tobacco Use   Smoking  status: Never   Smokeless tobacco: Never  Vaping Use   Vaping Use: Never used  Substance and Sexual Activity   Alcohol use: No   Drug use: Never   Sexual activity: Not on file  Other Topics Concern   Not on file  Social History Narrative   Not on file   Social Determinants of Health   Financial Resource Strain: Not on file  Food Insecurity: No Food Insecurity (08/29/2022)   Hunger Vital Sign    Worried About Running Out of Food in the Last Year: Never true    Ran Out of Food in the Last Year: Never true  Transportation Needs: No Transportation Needs (08/29/2022)   PRAPARE - Hydrologist (Medical): No    Lack of Transportation (Non-Medical): No  Physical Activity: Not on file  Stress: Not on file  Social Connections: Not on file   Allergies  Allergen Reactions   Other     Adhesive, Caffene-abd pain   Cialis [Tadalafil]     Caused acute angle glaucoma   Crestor [Rosuvastatin] Other (Moyer Comments)    Causes joint aches   Soy Protein [Soybean Oil]     Gastrointestinal upset   Viagra [Sildenafil]     Caused acute angle glaucoma   Family History  Problem Relation Age of Onset   Aortic aneurysm Father        abdominal   Hypertension Father    CAD Father    Ovarian cancer Mother        cysto adenocarcenoma   Osteoarthritis Sister    Glaucoma Brother    Bipolar disorder Brother    Patrick Moyer Brother    Obesity Brother      Current Outpatient Medications (Cardiovascular):    rosuvastatin (CRESTOR) 5 MG tablet, Take 1 tablet by mouth 2-3 times per week (Patient taking differently: Take 5 mg by mouth every Monday, Wednesday, and Friday.)  Current Outpatient Medications (Respiratory):    fexofenadine (ALLEGRA) 180 MG tablet, Take 180 mg by mouth daily.   fluticasone (FLONASE) 50 MCG/ACT nasal spray, Place 1 spray into both nostrils in the morning and at bedtime.   guaiFENesin (MUCINEX) 600 MG 12 hr tablet, Take 1,200 mg by mouth 2 (two)  times daily.   montelukast (SINGULAIR) 10 MG tablet, Take 10 mg by mouth at bedtime.    montelukast (SINGULAIR) 10 MG tablet, Take 10 mg by mouth at bedtime as needed.  Current Outpatient Medications (Analgesics):    acetaminophen (TYLENOL) 500 MG tablet, Take 500 mg by mouth at bedtime.   celecoxib (CELEBREX) 200 MG capsule, Take 200 mg by mouth 2 (two) times daily.   aspirin  EC 325 MG tablet, Take 1 tablet (325 mg total) by mouth 2 (two) times daily. For 30 days after surgery   oxyCODONE (ROXICODONE) 5 MG immediate release tablet, Take 1 tablet (5 mg total) by mouth every 4 (four) hours as needed for severe pain.  Current Outpatient Medications (Hematological):    folic acid (FOLVITE) 1 MG tablet, Take 1 mg by mouth daily.  Current Outpatient Medications (Other):    AMBULATORY NON FORMULARY MEDICATION, 1 Units by Other route once for 1 dose.   Ascorbic Acid (VITAMIN C) 1000 MG tablet, Take 1,000 mg by mouth daily.   B Complex Vitamins (B COMPLEX 100 PO), Take 1 tablet by mouth in the morning and at bedtime.   Ca Carbonate-Mag Hydroxide (ROLAIDS PO), Take by mouth as needed.   cholecalciferol (VITAMIN D) 1000 UNITS tablet, Take 1,000 Units by mouth in the morning and at bedtime.   diclofenac Sodium (VOLTAREN) 1 % GEL, Apply 1 Application topically 3 (three) times daily as needed (pain).   FIBER PO, Take 1 each by mouth daily.   gabapentin (NEURONTIN) 600 MG tablet, Take 1.5 tablets (900 mg total) by mouth 3 (three) times daily.   glucosamine-chondroitin 500-400 MG tablet, Take 1 tablet by mouth daily.   L-Lysine 500 MG CAPS, Take 1 capsule by mouth in the morning and at bedtime.   Lutein-Zeaxanthin 25-5 MG CAPS, Take 1 tablet by mouth daily.   methotrexate (RHEUMATREX) 2.5 MG tablet, Take 10 mg by mouth daily.   Omega-3 Fatty Acids (FISH OIL) 1000 MG CAPS, Take 1 capsule (1,000 mg total) by mouth in the morning and at bedtime. (Patient taking differently: Take 1 capsule by mouth at  bedtime.)   omeprazole (PRILOSEC) 20 MG capsule, Take 20 mg by mouth daily.   Potassium Gluconate 550 (90 K) MG TABS, Take 1 tablet by mouth as directed. 5 tablets weekly. (Patient taking differently: Take 1-2 tablets by mouth daily as needed (cramps).)   SODIUM FLUORIDE 5000 PPM 1.1 % GEL dental gel, Take 1 application by mouth daily.   Turmeric 500 MG CAPS, Take 500 mg by mouth daily.   valACYclovir (VALTREX) 1000 MG tablet, Take 1,000 mg by mouth daily as needed (cold sore).   ondansetron (ZOFRAN) 4 MG tablet, Take 1 tablet (4 mg total) by mouth every 8 (eight) hours as needed for nausea or vomiting.   sennosides-docusate sodium (SENOKOT-S) 8.6-50 MG tablet, Take 2 tablets by mouth daily.   Reviewed prior external information including notes and imaging from  primary care provider As well as notes that were available from care everywhere and other healthcare systems.  Past medical history, social, surgical and family history all reviewed in electronic medical record.  No pertanent information unless stated regarding to the chief complaint.   Review of Systems:  No headache, visual changes, nausea, vomiting, diarrhea, constipation, dizziness, abdominal pain, skin rash, fevers, chills, night sweats, weight loss, swollen lymph nodes, body aches, joint swelling, chest pain, shortness of breath, mood changes. POSITIVE muscle aches  Objective  Blood pressure 122/84, pulse 73, height 6\' 2"  (1.88 m), weight 203 lb (92.1 kg), SpO2 99 %.   General: No apparent distress alert and oriented x3 mood and affect normal, dressed appropriately.  HEENT: Pupils equal, extraocular movements intact  Respiratory: Patient's speak in full sentences and does not appear short of breath  Cardiovascular: No lower extremity edema, non tender, no erythema  Significant hypermobility noted.  Patient does have significant overpronation of both hindfoot bilaterally left  greater than right.  Patient is wearing a leather  brace on the ankle but is not giving him any support at the moment.    Impression and Recommendations:    The above documentation has been reviewed and is accurate and complete Judi Saa, DO

## 2022-11-15 NOTE — Telephone Encounter (Signed)
  Patient c/o Palpitations:  High priority if patient c/o lightheadedness, shortness of breath, or chest pain  How long have you had palpitations/irregular HR/ Afib? Are you having the symptoms now? No   Are you currently experiencing lightheadedness, SOB or CP? No   Do you have a history of afib (atrial fibrillation) or irregular heart rhythm? Yes   Have you checked your BP or HR? (document readings if available):   Are you experiencing any other symptoms?   Last night 3 afib episode per his apple watch, he said, his been having irregular heart beat as well

## 2022-11-15 NOTE — Telephone Encounter (Signed)
Patient reports that last week he had an episode of elevated heart rate and he felt panicky. He states that he had updated his phone and his apple watch was not working so he was unable to get an EKG readings. Patient states that yesterday this happened again, he was able to get an EKG reading this time. He states that watch showed 3 episodes of A Fib last night. Today he feels fine and is back in normal sinus rhythm. Advised I will talk with Dr. Shari Prows to see recommendations for office visit or heart monitor placed confirm.

## 2022-11-20 NOTE — Telephone Encounter (Signed)
Pt calling back to f/u on response from provider regarding Afib episodes. Please advise

## 2022-11-20 NOTE — Telephone Encounter (Signed)
Left another message for the pt to call the office back.

## 2022-11-20 NOTE — Telephone Encounter (Signed)
Left the pt a message to call the office back to arrange him an appt with an APP for sometime this week, per Dr. Shari Prows.  Did go ahead and placed a hold on Robin Searing NP schedule for tomorrow 11/28 at 0850 for the pt.  When pt calls back, we will schedule this appt there accordingly.   Meriam Sprague, MD  Loa Socks, LPN Caller: Unspecified (5 days ago, 11:04 AM) I do not see a history of Afib for him.  Can we get him scheduled for a visit with APP as soon as possible and send a 7 day monitor to assess further?

## 2022-11-21 NOTE — Progress Notes (Unsigned)
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 His Moyer was diagnosed with Marfan syndrome, father died of ascending aortic dissection and coronary dissection, daughter is unusually tall with long arm span than body legs when she was growing up.   Previous coronary CTA 10/14/2019 with coronary calcium score 57 placing him in the 41st percentile for age and sex matched control.  He had mildly dilated aortic root at the sinus level with maximum diameter 43 mm.   Patient saw Patrick Moyer 01/2022 and was doing well. To consider genetic testing pending insurance coverage.  Patient reports that last week he had an episode of elevated heart rate and he felt panicky. He states that he had updated his phone and his apple watch was not working so he was unable to get an EKG readings. Patient states that yesterday this happened again, He states that watch showed 3 episodes of A Fib last night. Today he feels fine and is back in normal sinus rhythm.  This all started 3 weeks ago. He got a new phone and it didn't record on his watch. Always happens in the evening. Just moved to a retirement community of Patrick Moyer.  Usually swims a mile daily without difficultly but hasn't been swimming as much with the move.     Past Medical History:  Diagnosis Date   ALLERGIC RHINITIS    Arthritis pain of wrist    Benign localized prostatic hyperplasia with lower urinary tract  symptoms (LUTS)    Diverticulosis    ED (erectile dysfunction)    Environmental and seasonal allergies    Family history of Marfan syndrome    father, paternal uncle, and Moyer   GERD (gastroesophageal reflux disease)    High cholesterol    History of closed head injury    04-28-2020 per pt yrs ago w/ no loc, residual mild memory loss which has resolved   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 75   per pt treated with INH for one year and cxr's since have been normal   HSV-1 infection    Marfanoid habitus    per pt has wing span is longer than his height, effects eyes (iris), hyperflexibility of ankle and hands,  AAA   Mild CAD    cardiologist--  dr Delton See---  CTA 10-22-2019   Non-celiac gluten sensitivity    Numbness and tingling in both hands    per pt intermittantly due to cervical spine   OSA on CPAP    auto 8-10,  pt stated uses every night   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)   Pulmonary nodules    per pt followed by pcp (CTA and CT chest 10-22-2019 , stable)   Thoracic ascending aortic aneurysm [facility])    cardiologist--- dr Eloy End--- CTA 10-22-2019  ascending aorta 63mm and aortic root 26mm   Wears glasses  Current Medications: Current Meds  Medication Sig   acetaminophen (TYLENOL) 500 MG tablet Take 500 mg by mouth at bedtime.   AMBULATORY NON FORMULARY MEDICATION 1 Units by Other route once for 1 dose.   Ascorbic Acid (VITAMIN C) 1000 MG tablet Take 1,000 mg by mouth daily.   B Complex Vitamins (B COMPLEX 100 PO) Take 1 tablet by mouth in the morning and at bedtime.   Ca Carbonate-Mag Hydroxide (ROLAIDS PO) Take by mouth as needed.   celecoxib (CELEBREX) 200 MG capsule Take 200 mg by mouth 2 (two) times daily.   cholecalciferol (VITAMIN D) 1000 UNITS tablet Take 1,000 Units by mouth in the morning and at bedtime.   diclofenac Sodium (VOLTAREN) 1 % GEL Apply 1 Application topically 3 (three) times daily as needed (pain).    fexofenadine (ALLEGRA) 180 MG tablet Take 180 mg by mouth daily.   FIBER PO Take 1 each by mouth daily.   fluticasone (FLONASE) 50 MCG/ACT nasal spray Place 1 spray into both nostrils in the morning and at bedtime.   folic acid (FOLVITE) 1 MG tablet Take 1 mg by mouth daily.   gabapentin (NEURONTIN) 600 MG tablet Take 1.5 tablets (900 mg total) by mouth 3 (three) times daily.   glucosamine-chondroitin 500-400 MG tablet Take 1 tablet by mouth daily.   guaiFENesin (MUCINEX) 600 MG 12 hr tablet Take 1,200 mg by mouth 2 (two) times daily.   L-Lysine 500 MG CAPS Take 1 capsule by mouth in the morning and at bedtime.   Lutein-Zeaxanthin 25-5 MG CAPS Take 1 tablet by mouth daily.   methotrexate (RHEUMATREX) 2.5 MG tablet Take 10 mg by mouth daily.   montelukast (SINGULAIR) 10 MG tablet Take 10 mg by mouth at bedtime.    montelukast (SINGULAIR) 10 MG tablet Take 10 mg by mouth at bedtime as needed.   Omega-3 Fatty Acids (FISH OIL) 1000 MG CAPS Take 1 capsule (1,000 mg total) by mouth in the morning and at bedtime.   omeprazole (PRILOSEC) 20 MG capsule Take 20 mg by mouth daily.   Potassium Gluconate 550 (90 K) MG TABS Take 1 tablet by mouth as directed. 5 tablets weekly.   rosuvastatin (CRESTOR) 5 MG tablet Take 1 tablet by mouth 2-3 times per week   SODIUM FLUORIDE 5000 PPM 1.1 % GEL dental gel Take 1 application by mouth daily.   Turmeric 500 MG CAPS Take 500 mg by mouth daily.   valACYclovir (VALTREX) 1000 MG tablet Take 1,000 mg by mouth daily as needed (cold sore).    Allergies:   Other, Cialis [tadalafil], Crestor [rosuvastatin], Soy protein [place] oil], and Viagra [sildenafil]   Social History   Tobacco Use   Smoking status: Never   Smokeless tobacco: Never  Vaping Use   Vaping Use: Never used  Substance Use Topics   Alcohol use: No   Drug use: Never    Family Hx: The patient's family history includes Aortic aneurysm in Patrick O. Petzold "Ed"'s father; Bipolar disorder in Patrick Moyer.  Patrick Moyer; CAD in Patrick Moyer. Patrick "Ed"'s father; Glaucoma in Patrick Moyer; Hypertension in Patrick O. Rebello "Ed"'s father; Marfan syndrome in Patrick Moyer. [place] "Ed"'s Moyer; Obesity in [place]. [place] "Ed"'s Moyer; Osteoarthritis in [place]. [place] "Ed"'s sister; Ovarian cancer in [place] O. [place] "Ed"'s mother.  ROS     Physical Exam:    VS:  BP 124/80   Ht 6\' 2"  (1.88 m)   Wt 204 lb 9.6  oz (92.8 kg)   SpO2 98%   BMI 26.27 kg/m     Wt Readings from Last 3 Encounters:  11/22/22 204 lb 9.6 oz (92.8 kg)  11/22/22 203 lb (92.1 kg)  10/18/22 203 lb (92.1 kg)    Physical Exam  GEN: Well nourished, well developed, in no acute distress  HEENT: normal  Neck: no JVD, carotid bruits, or masses Cardiac:RRR; no murmurs, rubs, or gallops  Respiratory:  clear to auscultation bilaterally, normal work of breathing GI: soft, nontender, nondistended, + BS Ext: without cyanosis, clubbing, or edema, Good distal pulses bilaterally MS: no deformity or atrophy  Skin: warm and dry, no rash Neuro:  Alert and Oriented x 3,   Psych: euthymic mood, full affect        EKGs/Labs/Other Test Reviewed:    EKG:  EKG is   ordered today.  The ekg ordered today demonstrates NSR, minimal LVH  Recent Labs: 08/16/2022: Hemoglobin 15.3; Platelets 221   Recent Lipid Panel No results for input(s): "CHOL", "TRIG", "HDL", "VLDL", "LDLCALC", "LDLDIRECT" in the last 8760 hours.   Prior CV Studies:   CTA Chest/Aorta 01/09/2022: FINDINGS: Vascular Findings:   Stable mild fusiform aneurysmal dilatation of the ascending thoracic aorta with measurements as follows. The thoracic aorta tapers to a normal caliber at the level of the aortic arch. The descending thoracic aorta is of normal caliber and widely patent without a hemodynamically significant stenosis. No evidence of thoracic aortic dissection or periaortic stranding on this nongated examination.   Conventional configuration  of the aortic arch. The branch vessels of the aortic arch appear patent throughout their imaged courses.   Normal heart size. Scattered coronary artery calcifications. No pericardial effusion though small amount of fluid is seen within the pericardial recess.   Although this examination was not tailored for the evaluation the pulmonary arteries, there are no discrete filling defects within the central pulmonary arterial tree to suggest central pulmonary embolism. Normal caliber of the main pulmonary artery.   -------------------------------------------------------------   Thoracic aortic measurements:   SINOTUBULAR JUNCTION: 38 mm as measured in greatest oblique short axis coronal dimension.   PROXIMAL ASCENDING THORACIC AORTA: 42 mm as measured in greatest oblique short axis axial dimension at the level of the main pulmonary artery (image 59, series 4) and approximately 40 mm in greatest oblique short axis coronal diameter (coronal image 45, series 7), unchanged compared to the 04/2015 examination.   AORTIC ARCH: 33 mm as measured in greatest oblique short axis sagittal dimension.   PROXIMAL DESCENDING THORACIC AORTA: 30 mm as measured in greatest oblique short axis axial dimension at the level of the main pulmonary artery.   DISTAL DESCENDING THORACIC AORTA: 28 mm as measured in greatest oblique short axis axial dimension at the level of the diaphragmatic hiatus.   Review of the MIP images confirms the above findings.   -------------------------------------------------------------   Non-Vascular Findings:   Mediastinum/Lymph Nodes: Redemonstrated calcified hilar and mediastinal lymph nodes, the sequela of previous granulomatous infection. No bulky mediastinal, hilar axillary lymphadenopathy.   Lungs/Pleura: Redemonstrated biapical pleuroparenchymal thickening, extending along the posterosuperior aspect of the right major fissure, similar to remote examination  performed in 2016. No discrete focal airspace opacities. No pleural effusion or pneumothorax.   Scattered punctate pulmonary nodules are unchanged since the 04/2015 examination with index 5 mm right middle lobe pulmonary nodule seen on image 62, series 5 and additional pulmonary nodules seen on images 69 and 84, series 5. Imaging stability for greater than 2  years is indicative of a benign etiology. No worrisome new or enlarging pulmonary nodules.   Upper abdomen: Limited early arterial phase evaluation of the upper abdomen demonstrates diffuse decreased attenuation of the hepatic parenchyma suggestive of hepatic steatosis. Normal appearance of the gallbladder given degree distention. No radiopaque gallstones.   Musculoskeletal: No acute or aggressive osseous abnormalities. 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 Regional soft tissues appear normal. The thyroid gland appears slightly atrophic but without discrete nodule or mass.   IMPRESSION: 1. Stable uncomplicated mild fusiform aneurysmal dilatation of the ascending thoracic aorta measuring 42 mm in diameter, unchanged compared to the 04/2015 examination. Aortic aneurysm NOS (ICD10-I71.9). 2. Coronary artery calcifications. Aortic Atherosclerosis (ICD10-I70.0).   Coronary CTA: 10/22/2019   1. Coronary calcium score of 57. This was 27 percentile for age and sex matched control.   2. Normal coronary origin with right dominance.   3. CAD-RADS 1. Minimal non-obstructive CAD (0-24%) in the proximal LAD. Consider non-atherosclerotic causes of chest pain. Consider preventive therapy and risk factor modification.   4. Mildly dilated aortic root at the sinus level with maximum diameter 43 mm, upper normal size of the ascending aorta measuring 40 mm. When compared to the prior study from 05/07/2018 there has been no change.   Echo 09/10/2019:  1. The left ventricle has normal systolic function with an  ejection  fraction of 60-65%. The cavity size was normal. There is mild asymmetric  left ventricular hypertrophy. Left ventricular diastolic Doppler  parameters are consistent with impaired  relaxation. No evidence of left ventricular regional wall motion  abnormalities.   2. Mild Doppler evident acceleration of flow around distal outflow tract  septal hypertrophy (may result in murmur).   3. The right ventricle has normal systolic function. The cavity was  normal. There is No increase in right ventricular wall thickness.   4. Left atrial size was normal.   5. Right atrial size was normal.   6. No evidence of mitral valve stenosis.   7. The aortic valve is tricuspid. Mild thickening of the aortic valve.  Mild calcification of the aortic valve. Aortic valve regurgitation was not  visualized by color flow Doppler.   8. The pulmonic valve was grossly normal. Pulmonic valve regurgitation is not visualized by color flow Doppler.   9. The aorta is abnormal unless otherwise noted.  10. There is dilatation of the ascending aorta measuring 40 mm.  11. The average left ventricular global longitudinal strain is -22.7 %.      Risk Assessment/Calculations/Metrics:              ASSESSMENT & PLAN:   No problem-specific Assessment & Plan notes found for this encounter.   Palpitations with apple watch saying he had Afib 3 times but it didn't pair with his new phone so no recordings of this. Always happen at night and goes about 106-130/m. In NSR today. Will update echo, 2 week Zio, and check labs including TSH. CHADS2VASC=3 -ASA 81 mg daily for now. If Afib documented will need eliquis.   Nonobstructive CAD: Cardiac CTA 10/22/19 with coronary calcium score of 57 and minimal nonobstructive disease <25% in LAD. Stable with no anginal symptoms. Remains very active.  -Continue crestor  three times per week -Goal LDL<70   Ascending Aortic Aneurysm: trong Family History of Marfan Syndrome: Chest  CTA 04/27/2018 ascending aortic dimension 41 mm and aortic root dimension at the sinuses level 43 mm. Chest CTA 09/2019 unchanged diameter at  sense of a 43 mm well maximum diameter ascending aorta was 40 mm. Repeat CT 12/2021 with ascending aorta 42mm. -Continue annual monitoring -Avoid heavy lifting  -Will consider genetic testing in future pending insurance   HLD: -Continue crestor 5mg  three times per week -Continue lifestyle modifications -Goal LDL<70 -LDL 83 04/2021-scheduled for 11/2022.   Mild MR: Noted on TTE 2020. -Will repeat TTE now                    Dispo:  No follow-ups on file.   Medication Adjustments/Labs and Tests Ordered: Current medicines are reviewed at length with the patient today.  Concerns regarding medicines are outlined above.  Tests Ordered: No orders of the defined types were placed in this encounter.  Medication Changes: No orders of the defined types were placed in this encounter.  Signed, Jacolyn ReedyMichele Tiandre Teall, PA-C  11/22/2022 12:48 PM    [facility] [address], [place], [place]  [address] Phone: [phone]) [phone]; Fax: [phone]

## 2022-11-21 NOTE — Telephone Encounter (Signed)
Pt is scheduled for 11/29 at 12:15 pm with Jacolyn Reedy PA-C. This was scheduled by the operator today.

## 2022-11-22 ENCOUNTER — Ambulatory Visit (INDEPENDENT_AMBULATORY_CARE_PROVIDER_SITE_OTHER): Payer: Medicare PPO | Admitting: Family Medicine

## 2022-11-22 ENCOUNTER — Encounter: Payer: Self-pay | Admitting: Physician Assistant

## 2022-11-22 ENCOUNTER — Ambulatory Visit: Payer: Medicare PPO | Attending: Physician Assistant | Admitting: Physician Assistant

## 2022-11-22 ENCOUNTER — Ambulatory Visit (INDEPENDENT_AMBULATORY_CARE_PROVIDER_SITE_OTHER): Payer: Medicare PPO

## 2022-11-22 VITALS — BP 122/84 | HR 73 | Ht 74.0 in | Wt 203.0 lb

## 2022-11-22 VITALS — BP 124/80 | Ht 74.0 in | Wt 204.6 lb

## 2022-11-22 DIAGNOSIS — M5136 Other intervertebral disc degeneration, lumbar region: Secondary | ICD-10-CM

## 2022-11-22 DIAGNOSIS — M216X9 Other acquired deformities of unspecified foot: Secondary | ICD-10-CM

## 2022-11-22 DIAGNOSIS — I340 Nonrheumatic mitral (valve) insufficiency: Secondary | ICD-10-CM | POA: Diagnosis not present

## 2022-11-22 DIAGNOSIS — I2510 Atherosclerotic heart disease of native coronary artery without angina pectoris: Secondary | ICD-10-CM | POA: Diagnosis not present

## 2022-11-22 DIAGNOSIS — I4891 Unspecified atrial fibrillation: Secondary | ICD-10-CM

## 2022-11-22 DIAGNOSIS — M5416 Radiculopathy, lumbar region: Secondary | ICD-10-CM

## 2022-11-22 DIAGNOSIS — M533 Sacrococcygeal disorders, not elsewhere classified: Secondary | ICD-10-CM | POA: Diagnosis not present

## 2022-11-22 DIAGNOSIS — E785 Hyperlipidemia, unspecified: Secondary | ICD-10-CM | POA: Diagnosis not present

## 2022-11-22 DIAGNOSIS — R002 Palpitations: Secondary | ICD-10-CM | POA: Diagnosis not present

## 2022-11-22 DIAGNOSIS — I7121 Aneurysm of the ascending aorta, without rupture: Secondary | ICD-10-CM

## 2022-11-22 DIAGNOSIS — G8929 Other chronic pain: Secondary | ICD-10-CM | POA: Diagnosis not present

## 2022-11-22 MED ORDER — ASPIRIN 81 MG PO TBEC: 81.0000 mg | DELAYED_RELEASE_TABLET | Freq: Every day | ORAL | 3 refills | Status: DC

## 2022-11-22 MED ORDER — AMBULATORY NON FORMULARY MEDICATION: 1.0000 [IU] | Freq: Once | 0 refills | Status: AC

## 2022-11-22 NOTE — Assessment & Plan Note (Signed)
Patient is doing very well with the sacroiliac joint pain.  Patient did have the knee replacement on the left knee and likely will do relatively well.  We discussed with patient's to continue to be active.  Has been doing more swimming recently as well that I think has been beneficial.  Discussed which activities can be beneficial in which ones could potentially cause more aggravation.  Patient will increase activity slowly and follow-up with me again as needed with how well patient is doing.

## 2022-11-22 NOTE — Patient Instructions (Signed)
Doing great [facility] will call you See me as needed

## 2022-11-22 NOTE — Assessment & Plan Note (Signed)
Patient has significant overpronation of the ankles bilaterally.  Secondary to hypermobility syndrome.  Do feel that patient would respond well to some bracing and will be sent to the [facility] for further evaluation and treatment.  We discussed proper shoes.  Patient knows that alignment will help with some of the other problems.  Follow-up with me otherwise as needed

## 2022-11-22 NOTE — Patient Instructions (Signed)
Medication Instructions:  START Aspirin 81mg  Take 1 tablet once a day *If you need a refill on your cardiac medications before your next appointment, please call your pharmacy*   Lab Work: TODAY- CBC, CMET & TSH If you have labs (blood work) drawn today and your tests are completely normal, you will receive your results only by: MyChart Message (if you have MyChart) OR A paper copy in the mail If you have any lab test that is abnormal or we need to change your treatment, we will call you to review the results.   Testing/Procedures: Your physician has requested that you have an echocardiogram. Echocardiography is a painless test that uses sound waves to create images of your heart. It provides your doctor with information about the size and shape of your heart and how well your heart's chambers and valves are working. This procedure takes approximately one hour. There are no restrictions for this procedure. Please do NOT wear cologne, perfume, aftershave, or lotions (deodorant is allowed). Please arrive 15 minutes prior to your appointment time.  ZIO XT- Long Term Monitor Instructions  Your physician has requested you wear a ZIO patch monitor for 14 days.  This is a single patch monitor. [employer] supplies one patch monitor per enrollment. Additional stickers are not available. Please do not apply patch if you will be having a Nuclear Stress Test,  Echocardiogram, Cardiac CT, MRI, or Chest Xray during the period you would be wearing the  monitor. The patch cannot be worn during these tests. You cannot remove and re-apply the  ZIO XT patch monitor.  Your ZIO patch monitor will be mailed 3 day USPS to your address on file. It may take 3-5 days  to receive your monitor after you have been enrolled.  Once you have received your monitor, please review the enclosed instructions. Your monitor  has already been registered assigning a specific monitor serial # to you.  Billing and Patient  Assistance Program Information  We have supplied [employer] with any of your insurance information on file for billing purposes. [employer] offers a sliding scale Patient Assistance Program for patients that do not have  insurance, or whose insurance does not completely cover the cost of the ZIO monitor.  You must apply for the Patient Assistance Program to qualify for this discounted rate.  To apply, please call [employer] at [phone], select option 4, select option 2, ask to apply for  Patient Assistance Program. [phone] will ask your household income, and how many people  are in your household. They will quote your out-of-pocket cost based on that information.  [employer] will also be able to set up a 51-month, interest-free payment plan if needed.  Applying the monitor   Shave hair from upper left chest.  Hold abrader disc by orange tab. Rub abrader in 40 strokes over the upper left chest as  indicated in your monitor instructions.  Clean area with 4 enclosed alcohol pads. Let dry.  Apply patch as indicated in monitor instructions. Patch will be placed under collarbone on left  side of chest with arrow pointing upward.  Rub patch adhesive wings for 2 minutes. Remove white label marked "1". Remove the white  label marked "2". Rub patch adhesive wings for 2 additional minutes.  While looking in a mirror, press and release button in center of patch. A small green light will  flash 3-4 times. This will be your only indicator that the monitor has been turned on.  Do not shower for  the first 24 hours. You may shower after the first 24 hours.  Press the button if you feel a symptom. You will hear a small click. Record Date, Time and  Symptom in the Patient Logbook.  When you are ready to remove the patch, follow instructions on the last 2 pages of Patient  Logbook. Stick patch monitor onto the last page of Patient Logbook.  Place Patient Logbook in the blue and white box. Use locking tab on box and  tape box closed  securely. The blue and white box has prepaid postage on it. Please place it in the mailbox as  soon as possible. Your physician should have your test results approximately 7 days after the  monitor has been mailed back to [facility].  Call [facility] Customer Care at [phone] if you have questions regarding  your ZIO XT patch monitor. Call them immediately if you see an orange light blinking on your  monitor.  If your monitor falls off in less than 4 days, contact our Monitor department at [phone].  If your monitor becomes loose or falls off after 4 days call [employer] at [phone] for  suggestions on securing your monitor   Follow-Up: At [facility], you and your health needs are our priority.  As part of our continuing mission to provide you with exceptional heart care, we have created designated Provider Care Teams.  These Care Teams include your primary Cardiologist (physician) and Advanced Practice Providers (APPs -  Physician Assistants and Nurse Practitioners) who all work together to provide you with the care you need, when you need it.  We recommend signing up for the patient portal called "MyChart".  Sign up information is provided on this After Visit Summary.  MyChart is used to connect with patients for Virtual Visits (Telemedicine).  Patients are able to view lab/test results, encounter notes, upcoming appointments, etc.  Non-urgent messages can be sent to your provider as well.   To learn more about what you can do with MyChart, go to [email or web address].    Your next appointment:   AFTER MONITOR WITH [place]   The format for your next appointment:   In Person  Provider:   Meriam Sprague, MD     Other Instructions   Important Information About Sugar

## 2022-11-22 NOTE — Progress Notes (Unsigned)
[id number] ZIO XT from office inventory applied to patient.  Dr. Shari Prows to read.

## 2022-11-22 NOTE — Assessment & Plan Note (Signed)
Known degenerative disc disease.  Patient has responded extremely well into the Working out with a trainer.  Patient will do well.  Will continue to monitor the hypermobility but can follow-up with me as needed

## 2022-11-23 LAB — COMPREHENSIVE METABOLIC PANEL
ALT: 20 IU/L (ref 0–44)
AST: 21 IU/L (ref 0–40)
Albumin/Globulin Ratio: 2 (ref 1.2–2.2)
Albumin: 4.3 g/dL (ref 3.9–4.9)
Alkaline Phosphatase: 75 IU/L (ref 44–121)
BUN/Creatinine Ratio: 28 — ABNORMAL HIGH (ref 10–24)
BUN: 19 mg/dL (ref 8–27)
Bilirubin Total: 0.4 mg/dL (ref 0.0–1.2)
CO2: 24 mmol/L (ref 20–29)
Calcium: 9.6 mg/dL (ref 8.6–10.2)
Chloride: 108 mmol/L — ABNORMAL HIGH (ref 96–106)
Creatinine, Ser: 0.67 mg/dL — ABNORMAL LOW (ref 0.76–1.27)
Globulin, Total: 2.1 g/dL (ref 1.5–4.5)
Glucose: 87 mg/dL (ref 70–99)
Potassium: 4.3 mmol/L (ref 3.5–5.2)
Sodium: 143 mmol/L (ref 134–144)
Total Protein: 6.4 g/dL (ref 6.0–8.5)
eGFR: 100 mL/min/{1.73_m2} (ref >59)

## 2022-11-23 LAB — CBC
Hematocrit: 40.4 % (ref 37.5–51.0)
Hemoglobin: 13.8 g/dL (ref 13.0–17.7)
MCH: 31.6 pg (ref 26.6–33.0)
MCHC: 34.2 g/dL (ref 31.5–35.7)
MCV: 92 fL (ref 79–97)
Platelets: 291 10*3/uL (ref 150–450)
RBC: 4.37 x10E6/uL (ref 4.14–5.80)
RDW: 14.6 % (ref 11.6–15.4)
WBC: 6.4 10*3/uL (ref 3.4–10.8)

## 2022-11-23 LAB — TSH: TSH: 1.5 u[IU]/mL (ref 0.450–4.500)

## 2022-11-24 DIAGNOSIS — H21239 Degeneration of iris (pigmentary), unspecified eye: Secondary | ICD-10-CM | POA: Diagnosis not present

## 2022-11-24 DIAGNOSIS — Q8743 Marfan's syndrome with skeletal manifestation: Secondary | ICD-10-CM | POA: Diagnosis not present

## 2022-12-04 DIAGNOSIS — B351 Tinea unguium: Secondary | ICD-10-CM | POA: Diagnosis not present

## 2022-12-04 DIAGNOSIS — Z0000 Encounter for general adult medical examination without abnormal findings: Secondary | ICD-10-CM | POA: Diagnosis not present

## 2022-12-04 DIAGNOSIS — Z79899 Other long term (current) drug therapy: Secondary | ICD-10-CM | POA: Diagnosis not present

## 2022-12-04 DIAGNOSIS — G720 Drug-induced myopathy: Secondary | ICD-10-CM | POA: Diagnosis not present

## 2022-12-04 DIAGNOSIS — M1990 Unspecified osteoarthritis, unspecified site: Secondary | ICD-10-CM | POA: Diagnosis not present

## 2022-12-04 DIAGNOSIS — E7800 Pure hypercholesterolemia, unspecified: Secondary | ICD-10-CM | POA: Diagnosis not present

## 2022-12-04 DIAGNOSIS — K2100 Gastro-esophageal reflux disease with esophagitis, without bleeding: Secondary | ICD-10-CM | POA: Diagnosis not present

## 2022-12-04 DIAGNOSIS — J309 Allergic rhinitis, unspecified: Secondary | ICD-10-CM | POA: Diagnosis not present

## 2022-12-04 DIAGNOSIS — G8929 Other chronic pain: Secondary | ICD-10-CM | POA: Diagnosis not present

## 2022-12-06 DIAGNOSIS — R2689 Other abnormalities of gait and mobility: Secondary | ICD-10-CM | POA: Diagnosis not present

## 2022-12-06 DIAGNOSIS — M19071 Primary osteoarthritis, right ankle and foot: Secondary | ICD-10-CM | POA: Diagnosis not present

## 2022-12-06 DIAGNOSIS — B351 Tinea unguium: Secondary | ICD-10-CM | POA: Diagnosis not present

## 2022-12-06 DIAGNOSIS — M2142 Flat foot [pes planus] (acquired), left foot: Secondary | ICD-10-CM | POA: Diagnosis not present

## 2022-12-06 DIAGNOSIS — M76822 Posterior tibial tendinitis, left leg: Secondary | ICD-10-CM | POA: Diagnosis not present

## 2022-12-07 DIAGNOSIS — Z79899 Other long term (current) drug therapy: Secondary | ICD-10-CM | POA: Diagnosis not present

## 2022-12-07 DIAGNOSIS — Z125 Encounter for screening for malignant neoplasm of prostate: Secondary | ICD-10-CM | POA: Diagnosis not present

## 2022-12-07 DIAGNOSIS — E7800 Pure hypercholesterolemia, unspecified: Secondary | ICD-10-CM | POA: Diagnosis not present

## 2022-12-13 ENCOUNTER — Ambulatory Visit (HOSPITAL_COMMUNITY): Payer: Medicare PPO | Attending: Cardiovascular Disease

## 2022-12-13 DIAGNOSIS — I4891 Unspecified atrial fibrillation: Secondary | ICD-10-CM | POA: Diagnosis not present

## 2022-12-13 LAB — ECHOCARDIOGRAM COMPLETE
Area-P 1/2: 2.25 cm2
S' Lateral: 3.1 cm

## 2022-12-14 ENCOUNTER — Telehealth: Payer: Self-pay

## 2022-12-14 ENCOUNTER — Other Ambulatory Visit: Payer: Self-pay | Admitting: Physician Assistant

## 2022-12-14 DIAGNOSIS — I4891 Unspecified atrial fibrillation: Secondary | ICD-10-CM

## 2022-12-14 MED ORDER — APIXABAN 5 MG PO TABS: 5.0000 mg | ORAL_TABLET | Freq: Two times a day (BID) | ORAL | 4 refills | Status: DC

## 2022-12-14 NOTE — Telephone Encounter (Signed)
Eliquis 5mg  refill request received. Patient is 71 years old, weight-92.8kg, Crea-0.67 on 11/22/2022, Diagnosis-Afib, and last seen by 11/24/2022 on 11/22/2022. Dose is appropriate based on dosing criteria. Will send in refill to requested pharmacy.

## 2022-12-14 NOTE — Telephone Encounter (Signed)
Pt made aware of results. Pt verbalized understanding. Pt agreeable to plan and will come by the office to pick up samples.

## 2022-12-14 NOTE — Telephone Encounter (Signed)
-----   Message from Dyann Kief, [place] sent at 12/14/2022  7:44 AM EST ----- Monitor shows mostly NSR but 7% atrial fibrillation as suspected which puts him at risk for stroke which we discussed at OV. Please start Eliquis 5 mg bid-give him 30 day free for new start. Stop aspirin. Labs stable in November. Keep f/u with Dr. Shari Prows in January. If he has any further questions I'm happy to call him.

## 2022-12-15 ENCOUNTER — Telehealth: Payer: Self-pay | Admitting: *Deleted

## 2022-12-15 MED ORDER — ROSUVASTATIN CALCIUM 5 MG PO TABS: 5.0000 mg | ORAL_TABLET | ORAL | 1 refills | Status: DC

## 2022-12-15 NOTE — Telephone Encounter (Signed)
Rx sent in

## 2022-12-21 NOTE — Progress Notes (Deleted)
Cardiology Office Note:    Date:  12/21/2022   ID:  Patrick Moyer, DOB 11-20-1951, MRN [id number]  PCP:  Patrick Cruel, MD   [facility] Cardiologist:  Patrick Bergeron, MD {   Referring MD: Patrick Cruel, MD    History of Present Illness:    Patrick Moyer is a 72 y.o. adult with a hx of ascending aortic aneurysm, nonobstructive coronary artery disease, marfanoid habitus, hyperlipidemia who was previously followed by Patrick Moyer who now returns to clinic for follow-up.  Per review of the record, the patient has a very strong family history of Marfan syndrome. His Moyer was diagnosed with Marfan syndrome, Moyer died of ascending aortic dissection and coronary dissection, daughter is unusually tall with long arm span than body legs when she was growing up.   Previous coronary CTA 10/14/2019 with coronary calcium score 57 placing him in the 41st percentile for age and sex matched control.  He had mildly dilated aortic root at the sinus level with maximum diameter 43 mm.  Last seen in clinic on 10/2022 by Patrick Moyer for episode of palpitations with his watch showing possible Afib. Cardiac Monitor 11/2022 showed 7% burden of Afib with longest episode lasting 5h and 37min, rare SVE and rare VE. He was started on apixaban 5mg  BID.  Today, ***    Past Medical History:  Diagnosis Date   ALLERGIC RHINITIS    Arthritis pain of wrist    Benign localized prostatic hyperplasia with lower urinary tract symptoms (LUTS)    Diverticulosis    ED (erectile dysfunction)    Environmental and seasonal allergies    Family history of Marfan syndrome    Moyer, paternal uncle, and Moyer   GERD (gastroesophageal reflux disease)    High cholesterol    History of closed head injury    04-28-2020 per pt yrs ago w/ no loc, residual mild memory loss which has resolved   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 8   per pt treated with INH for one year and cxr's since have been normal    HSV-1 infection    Marfanoid habitus    per pt has wing span is longer than his height, effects eyes (iris), hyperflexibility of ankle and hands,  AAA   Mild CAD    cardiologist--  dr Patrick Moyer---  CTA 10-22-2019   Non-celiac gluten sensitivity    Numbness and tingling in both hands    per pt intermittantly due to cervical spine   OSA on CPAP    auto 8-10,  pt stated uses every night   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)   Pulmonary nodules    per pt followed by pcp (CTA and CT chest 10-22-2019 , stable)   Thoracic ascending aortic aneurysm [facility])    cardiologist--- dr Patrick Moyer--- CTA 10-22-2019  ascending aorta 66mm and aortic root 103mm   Wears glasses     Past Surgical History:  Procedure Laterality Date   FINGER EXPLORATION Left 1995   left middle finger digital nerve repair   INGUINAL HERNIA REPAIR Left 1978   LAPAROSCOPIC APPENDECTOMY  2010   left knee arthroscopy      NASAL SEPTUM SURGERY  1975   TOTAL KNEE ARTHROPLASTY Left 08/29/2022   Procedure: UNICOMPARTMENTAL KNEE converted to a total knee;  Surgeon: Marchia Bond, MD;  Location: [facility];  Service: Orthopedics;  Laterality: Left;  with block  TRANSURETHRAL RESECTION OF PROSTATE N/A 05/05/2020   Procedure: TRANSURETHRAL RESECTION OF THE PROSTATE (TURP), BIPOLAR;  Surgeon: Ceasar Mons, MD;  Location: [facility];  Service: Urology;  Laterality: N/A;   WISDOM TOOTH EXTRACTION  1975    Current Medications: No outpatient medications have been marked as taking for the 01/01/23 encounter (Appointment) with Patrick Bergeron, MD.     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    Employer: [employer]  Tobacco Use   Smoking status: Never   Smokeless tobacco: Never  Vaping Use   Vaping Use: Never used  Substance and Sexual Activity   Alcohol use: No   Drug use: Never   Sexual activity: Not on file  Other Topics Concern   Not on file  Social History Narrative   Not on file   Social Determinants of Health   Financial Resource Strain: Not on file  Food Insecurity: No Food Insecurity (08/29/2022)   Hunger Vital Sign    Worried About Running Out of Food in the Last Year: Never true    Ran Out of Food in the Last Year: Never true  Transportation Needs: No Transportation Needs (08/29/2022)   PRAPARE - Hydrologist (Medical): No    Lack of Transportation (Non-Medical): No  Physical Activity: Not on file  Stress: Not on file  Social Connections: Not on file     Family History: The patient's family history includes Aortic aneurysm in Patrick O. [place] "Ed"'s Moyer; Bipolar disorder in Patrick Moyer. Patrick "Ed"'s Moyer; CAD in Patrick Moyer. Patrick "Ed"'s Moyer; Glaucoma in Patrick Moyer. Patrick "Ed"'s Moyer; Hypertension in Patrick Moyer. Patrick "Ed"'s Moyer; Marfan syndrome in Patrick Moyer. Patrick "Ed"'s Moyer; Obesity in Patrick Moyer. Patrick "Ed"'s Moyer; Osteoarthritis in Patrick Moyer. Patrick "Ed"'s Moyer; Ovarian cancer in Patrick Moyer. Patrick "Ed"'s mother.  ROS:   Review of Systems  Constitutional:  Negative for chills and fever.  HENT:  Negative for nosebleeds and sore throat.   Eyes:  Negative for blurred vision and pain.  Respiratory:  Negative for cough and sputum production.   Cardiovascular:  Negative for chest pain, palpitations, orthopnea, claudication, leg swelling and PND.  Gastrointestinal:  Negative for blood in stool and constipation.  Genitourinary:  Negative for dysuria and hematuria.  Musculoskeletal:  Positive for joint pain. Negative for falls.  Neurological:  Negative for loss of  consciousness and headaches.  Endo/Heme/Allergies:  Negative for polydipsia.  Psychiatric/Behavioral:  Negative for hallucinations. The patient does not have insomnia.      EKGs/Labs/Other Studies Reviewed:    The following studies were reviewed today: TTE 2022-12-19: IMPRESSIONS     1. Left ventricular ejection fraction, by estimation, is 60 to 65%. The  left ventricle has normal function. The left ventricle has no regional  wall motion abnormalities. There is mild concentric left ventricular  hypertrophy. Left ventricular diastolic  parameters are consistent with Grade I diastolic dysfunction (impaired  relaxation). Elevated left atrial pressure. The average left ventricular  global longitudinal strain is -22.6 %. The global longitudinal strain is  normal.   2. Right ventricular systolic function is  normal. The right ventricular  size is normal. There is normal pulmonary artery systolic pressure.   3. Left atrial size was mildly dilated.   4. The mitral valve is normal in structure. Mild to moderate mitral valve  regurgitation.   5. The aortic valve is tricuspid. Aortic valve regurgitation is not  visualized. No aortic stenosis is present.   6. Aortic dilatation noted. There is moderate dilatation of the aortic  root, measuring 45 mm. There is mild dilatation of the ascending aorta,  measuring 39 mm.   7. The inferior vena cava is normal in size with greater than 50%  respiratory variability, suggesting right atrial pressure of 3 mmHg.   Comparison(s): No significant change from prior study. Prior images  reviewed side by side.   CTA Chest/Aorta 01/09/2022: FINDINGS: Vascular Findings:   Stable mild fusiform aneurysmal dilatation of the ascending thoracic aorta with measurements as follows. The thoracic aorta tapers to a normal caliber at the level of the aortic arch. The descending thoracic aorta is of normal caliber and widely patent without a hemodynamically significant  stenosis. No evidence of thoracic aortic dissection or periaortic stranding on this nongated examination.   Conventional configuration of the aortic arch. The branch vessels of the aortic arch appear patent throughout their imaged courses.   Normal heart size. Scattered coronary artery calcifications. No pericardial effusion though small amount of fluid is seen within the pericardial recess.   Although this examination was not tailored for the evaluation the pulmonary arteries, there are no discrete filling defects within the central pulmonary arterial tree to suggest central pulmonary embolism. Normal caliber of the main pulmonary artery.   -------------------------------------------------------------   Thoracic aortic measurements:   SINOTUBULAR JUNCTION: 38 mm as measured in greatest oblique short axis coronal dimension.   PROXIMAL ASCENDING THORACIC AORTA: 42 mm as measured in greatest oblique short axis axial dimension at the level of the main pulmonary artery (image 59, series 4) and approximately 40 mm in greatest oblique short axis coronal diameter (coronal image 45, series 7), unchanged compared to the 04/2015 examination.   AORTIC ARCH: 33 mm as measured in greatest oblique short axis sagittal dimension.   PROXIMAL DESCENDING THORACIC AORTA: 30 mm as measured in greatest oblique short axis axial dimension at the level of the main pulmonary artery.   DISTAL DESCENDING THORACIC AORTA: 28 mm as measured in greatest oblique short axis axial dimension at the level of the diaphragmatic hiatus.   Review of the MIP images confirms the above findings.   -------------------------------------------------------------   Non-Vascular Findings:   Mediastinum/Lymph Nodes: Redemonstrated calcified hilar and mediastinal lymph nodes, the sequela of previous granulomatous infection. No bulky mediastinal, hilar axillary lymphadenopathy.   Lungs/Pleura: Redemonstrated biapical  pleuroparenchymal thickening, extending along the posterosuperior aspect of the right major fissure, similar to remote examination performed in 2016. No discrete focal airspace opacities. No pleural effusion or pneumothorax.   Scattered punctate pulmonary nodules are unchanged since the 04/2015 examination with index 5 mm right middle lobe pulmonary nodule seen on image 62, series 5 and additional pulmonary nodules seen on images 69 and 84, series 5. Imaging stability for greater than 2 years is indicative of a benign etiology. No worrisome new or enlarging pulmonary nodules.   Upper abdomen: Limited early arterial phase evaluation of the upper abdomen demonstrates diffuse decreased attenuation of the hepatic parenchyma suggestive of hepatic steatosis. Normal appearance of the gallbladder given degree distention. No radiopaque gallstones.   Musculoskeletal: No acute or aggressive osseous  abnormalities. 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 Regional soft tissues appear normal. The thyroid gland appears slightly atrophic but without discrete nodule or mass.   IMPRESSION: 1. Stable uncomplicated mild fusiform aneurysmal dilatation of the ascending thoracic aorta measuring 42 mm in diameter, unchanged compared to the 04/2015 examination. Aortic aneurysm NOS (ICD10-I71.9). 2. Coronary artery calcifications. Aortic Atherosclerosis (ICD10-I70.0).  Coronary CTA: 10/22/2019   1. Coronary calcium score of 57. This was 68 percentile for age and sex matched control.   2. Normal coronary origin with right dominance.   3. CAD-RADS 1. Minimal non-obstructive CAD (0-24%) in the proximal LAD. Consider non-atherosclerotic causes of chest pain. Consider preventive therapy and risk factor modification.   4. Mildly dilated aortic root at the sinus level with maximum diameter 43 mm, upper normal size of the ascending aorta measuring 40 mm. When compared to the prior  study from 05/07/2018 there has been no change.  Echo 09/10/2019:  1. The left ventricle has normal systolic function with an ejection  fraction of 60-65%. The cavity size was normal. There is mild asymmetric  left ventricular hypertrophy. Left ventricular diastolic Doppler  parameters are consistent with impaired  relaxation. No evidence of left ventricular regional wall motion  abnormalities.   2. Mild Doppler evident acceleration of flow around distal outflow tract  septal hypertrophy (may result in murmur).   3. The right ventricle has normal systolic function. The cavity was  normal. There is No increase in right ventricular wall thickness.   4. Left atrial size was normal.   5. Right atrial size was normal.   6. No evidence of mitral valve stenosis.   7. The aortic valve is tricuspid. Mild thickening of the aortic valve.  Mild calcification of the aortic valve. Aortic valve regurgitation was not  visualized by color flow Doppler.   8. The pulmonic valve was grossly normal. Pulmonic valve regurgitation is not visualized by color flow Doppler.   9. The aorta is abnormal unless otherwise noted.  10. There is dilatation of the ascending aorta measuring 40 mm.  11. The average left ventricular global longitudinal strain is -22.7 %.   EKG:   02/21/2022: No new ECG today 09/16/2021 Laurann Montana, NP): Sinus bradycardia, 57 bpm with no acute ST/T wave changes.  Recent Labs: 11/22/2022: ALT 20; BUN 19; Creatinine, Ser 0.67; Hemoglobin 13.8; Platelets 291; Potassium 4.3; Sodium 143; TSH 1.500   Recent Lipid Panel    Component Value Date/Time   CHOL 174 12/27/2020 1216   TRIG 69 12/27/2020 1216   HDL 49 12/27/2020 1216   CHOLHDL 3.6 12/27/2020 1216   LDLCALC 112 (H) 12/27/2020 1216     Risk Assessment/Calculations:           Physical Exam:    VS:  There were no vitals taken for this visit.    Wt Readings from Last 3 Encounters:  11/22/22 204 lb 9.6 oz (92.8 kg)  11/22/22  203 lb (92.1 kg)  10/18/22 203 lb (92.1 kg)     GEN: Well nourished, well developed in no acute distress HEENT: Normal NECK: No JVD; No carotid bruits CARDIAC: RRR, no murmurs, rubs, gallops RESPIRATORY:  Clear to auscultation without rales, wheezing or rhonchi  ABDOMEN: Soft, non-tender, non-distended MUSCULOSKELETAL:  No edema; No deformity  SKIN: Warm and dry NEUROLOGIC:  Alert and oriented x 3 PSYCHIATRIC:  Normal affect   ASSESSMENT:    No diagnosis found.  PLAN:    In order of  problems listed above:  #Paroxysmal Afib: Noted on cardiac monitor 11/2022 with 7% burden. TTE with LVEF 60-65%, mild LVH, normal RV, no significant valve disease. Now on apixaban for [facility]. -Continue apixaban 5mg  BID  #Nonobstructive CAD: Cardiac CTA 10/22/19 with coronary calcium score of 57 and minimal nonobstructive disease <25% in LAD. Stable with no anginal symptoms. Remains very active.  -Continue crestor 5mg  three times per week -Goal LDL<70  #Ascending Aortic Aneurysm: #Strong Family History of Marfan Syndrome: Chest CTA 04/27/2018 ascending aortic dimension 41 mm and aortic root dimension at the sinuses level 43 mm. Chest CTA 09/2019 unchanged diameter at sense of a 43 mm well maximum diameter ascending aorta was 40 mm. Repeat CT 12/2021 with ascending aorta 47mm. -Continue annual monitoring -Avoid heavy lifting   #HLD: -Continue crestor 5mg  three times per week -Continue lifestyle modifications -Goal LDL<70  #Joint Pain: #Collapsing Arches: #Possible Piriformis Syndrome: -Refer to Dr. Tamala Julian  #Mild to moderate MR: Noted on TTE 11/2022. -Continue serial monitoring in 2 years        Follow-up:  1 year.  Medication Adjustments/Labs and Tests Ordered: Current medicines are reviewed at length with the patient today.  Concerns regarding medicines are outlined above.   No orders of the defined types were placed in this encounter.  No orders of the defined types were placed in this  encounter.  There are no Patient Instructions on file for this visit.     Signed, Patrick Bergeron, MD  12/21/2022 8:49 AM    [facility]

## 2022-12-29 NOTE — Progress Notes (Signed)
Cardiology Office Note:    Date:  01/01/2023   ID:  Patrick Moyer, DOB November 28, 1951, MRN [id number]  PCP:  Daisy Floro, MD   [facility] Providers Cardiologist:  Meriam Sprague, MD {   Referring MD: Daisy Floro, MD    History of Present Illness:    Patrick Moyer is a 72 y.o. adult with a hx of ascending aortic aneurysm, nonobstructive coronary artery disease, marfanoid habitus, hyperlipidemia who was previously followed by Dr. Delton See who now returns to clinic for follow-up.  Per review of the record, the patient has a very strong family history of Marfan syndrome. His brother was diagnosed with Marfan syndrome, father died of ascending aortic dissection and coronary dissection, daughter is unusually tall with long arm span than body legs when she was growing up.   Previous coronary CTA 10/14/2019 with coronary calcium score 57 placing him in the 41st percentile for age and sex matched control.  He had mildly dilated aortic root at the sinus level with maximum diameter 43 mm.  Last seen in clinic on 10/2022 by Jacolyn Reedy for episode of palpitations with his watch showing possible Afib. Cardiac Monitor 11/2022 showed 7% burden of Afib with longest episode lasting 5h and , rare SVE and rare VE. He was started on apixaban 5mg  BID.  Today, he is accompanied by a family member. The patient states that he has not wanted to take Eliquis. He is mostly hesitant due to his active lifestyle and risk of bleeding. He enjoys building things and notes that he often has minor injuries with bleeding. He is currently only on the aspirin.   Prior to his recent move he believes he wasn't having episodes of Afib. Since then, he has had some episodes which he attributes to being without his usual stress management techniques in the new home. He states that "anything exciting" is a likely trigger of his atrial fibrillation. Typically he is symptomatic with every episode of Afib, but the  normal duration is about 2 hours or less. He is not currently on any nodal agents.  He denies any shortness of breath, or peripheral edema. No lightheadedness, headaches, syncope, orthopnea, or PND.  Additionally, he complains of some dysphagia lately. He plans to follow-up with GI.   Discussed anticoagulation, ablation and watchman today. He is interested in seeing Dr. to discuss ablation vs watchman further.    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 36   per pt treated with INH for one year and cxr's since have been normal   HSV-1 infection    Marfanoid habitus    per pt has wing span is longer than his height, effects eyes (iris), hyperflexibility of ankle and hands,  AAA   Mild CAD    cardiologist--  dr 12---  CTA 10-22-2019   Non-celiac gluten sensitivity    Numbness and tingling in both hands  per pt intermittantly due to cervical spine   OSA on CPAP    auto 8-10,  pt stated uses every night   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)   Pulmonary nodules    per pt followed by pcp (CTA and CT chest 10-22-2019 , stable)   Thoracic ascending aortic aneurysm [facility])    cardiologist--- dr Eloy End--- CTA 10-22-2019  ascending aorta 36mm and aortic root 44mm   Wears glasses     Past Surgical History:  Procedure Laterality Date   FINGER EXPLORATION Left 1995   left middle finger digital nerve repair   INGUINAL HERNIA  REPAIR Left 1978   LAPAROSCOPIC APPENDECTOMY  2010   left knee arthroscopy      NASAL SEPTUM SURGERY  1975   TOTAL KNEE ARTHROPLASTY Left 08/29/2022   Procedure: UNICOMPARTMENTAL KNEE converted to a total knee;  Surgeon: Teryl Lucy, MD;  Location: [facility];  Service: Orthopedics;  Laterality: Left;  with block   TRANSURETHRAL RESECTION OF PROSTATE N/A 05/05/2020   Procedure: TRANSURETHRAL RESECTION OF THE PROSTATE (TURP), BIPOLAR;  Surgeon: Rene Paci, MD;  Location: [facility];  Service: Urology;  Laterality: N/A;   WISDOM TOOTH EXTRACTION  1975    Current Medications: Current Meds  Medication Sig   acetaminophen (TYLENOL) 500 MG tablet Take 500 mg by mouth at bedtime.   apixaban (ELIQUIS) 5 MG TABS tablet TAKE 1 TABLET BY MOUTH TWICE A DAY   Ascorbic Acid (VITAMIN C) 1000 MG tablet Take 1,000 mg by mouth daily.   B Complex Vitamins (B COMPLEX 100 PO) Take 1 tablet by mouth in the morning and at bedtime.   Ca Carbonate-Mag Hydroxide (ROLAIDS PO) Take by mouth as needed.   celecoxib (CELEBREX) 200 MG capsule Take 200 mg by mouth 2 (two) times daily.   cholecalciferol (VITAMIN D) 1000 UNITS tablet Take 1,000 Units by mouth in the morning and at bedtime.   diclofenac Sodium (VOLTAREN) 1 % GEL Apply 1 Application topically 3 (three) times daily as needed (pain).   fexofenadine (ALLEGRA) 180 MG tablet Take 180 mg by mouth daily.   FIBER PO Take 1 each by mouth daily.   fluticasone (FLONASE) 50 MCG/ACT nasal spray Place 1 spray into both nostrils in the morning and at bedtime.   folic acid (FOLVITE) 1 MG tablet Take 1 mg by mouth daily.   gabapentin (NEURONTIN) 600 MG tablet Take 1.5 tablets (900 mg total) by mouth 3 (three) times daily.   glucosamine-chondroitin 500-400 MG tablet Take 1 tablet by mouth daily.   guaiFENesin (MUCINEX) 600 MG 12 hr tablet Take 1,200 mg by mouth 2 (two) times daily.   L-Lysine 500 MG CAPS Take 1 capsule by mouth in the morning and  at bedtime.   Lutein-Zeaxanthin 25-5 MG CAPS Take 1 tablet by mouth daily.   metoprolol tartrate (LOPRESSOR) 25 MG tablet Take 1 tablet (25 mg total) by mouth 2 (two) times daily.   montelukast (SINGULAIR) 10 MG tablet Take 10 mg by mouth at bedtime as needed.   Omega-3 Fatty Acids (FISH OIL) 1000 MG CAPS Take 1 capsule (1,000 mg total) by mouth in the morning and at bedtime.   omeprazole (PRILOSEC) 20 MG capsule Take 20 mg by mouth daily.   Potassium Gluconate 550 (90 K) MG TABS Take 1 tablet by mouth as directed. 5 tablets weekly.   rosuvastatin (CRESTOR) 5 MG tablet Take 1 tablet (5  mg total) by mouth 3 (three) times a week.   SODIUM FLUORIDE 5000 PPM 1.1 % GEL dental gel Take 1 application by mouth daily.   Turmeric 500 MG CAPS Take 500 mg by mouth daily.   valACYclovir (VALTREX) 1000 MG tablet Take 1,000 mg by mouth daily as needed (cold sore).     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    Employer: [employer]  Tobacco Use   Smoking status: Never   Smokeless tobacco: Never  Vaping Use   Vaping Use: Never used  Substance and Sexual Activity   Alcohol use: No   Drug use: Never   Sexual activity: Not on file  Other Topics Concern   Not on file  Social History Narrative   Not on file   Social Determinants of Health   Financial Resource Strain: Not on file  Food Insecurity: No Food Insecurity (08/29/2022)   Hunger Vital Sign    Worried About Running Out of Food in the Last Year: Never true    Ran Out of Food in the Last Year: Never true  Transportation Needs: No Transportation Needs (08/29/2022)   PRAPARE - Administrator, Civil Service  (Medical): No    Lack of Transportation (Non-Medical): No  Physical Activity: Not on file  Stress: Not on file  Social Connections: Not on file     Family History: The patient's family history includes Aortic aneurysm in [place] O. [place] "Ed"'s father; Bipolar disorder in [place]. [place] "Ed"'s brother; CAD in [place]. [place] "Ed"'s father; Glaucoma in [place]. [place] "Ed"'s brother; Hypertension in [place] O. [place] "Ed"'s father; Marfan syndrome in [place]. [place] "Ed"'s brother; Obesity in [place]. [place] "Ed"'s brother; Osteoarthritis in [place]. [place] "Ed"'s sister; Ovarian cancer in [place] O. [place] "Ed"'s mother.  ROS:   Review of Systems  Constitutional:  Negative for chills and fever.  HENT:  Negative for nosebleeds and sore throat.   Eyes:  Negative for blurred vision and pain.  Respiratory:  Negative for cough and sputum production.   Cardiovascular:  Positive for chest pain (Recent isolated episode). Negative for palpitations, orthopnea, claudication, leg swelling and PND.  Gastrointestinal:  Negative for blood in stool and constipation.  Genitourinary:  Negative for dysuria and hematuria.  Musculoskeletal:  Positive for joint pain. Negative for falls.  Neurological:  Negative for loss of consciousness and headaches.  Endo/Heme/Allergies:  Negative for polydipsia.  Psychiatric/Behavioral:  Negative for hallucinations. The patient does not have insomnia.      EKGs/Labs/Other Studies Reviewed:    The following studies were reviewed today:  Monitor 11/2022:   Patch wear time was 13 days and 23 hours   Predominant rhythm was NSR with average HR 73bpm   There was a 7% burden of Afib with longest lasting 5h and   Rare SVE (<1%), rare VE (<1%)   No pauses   Patient triggered events mainly correlated with AFib   Patch Wear Time:  13 days and 23 hours (2023-11-29T13:07:47-0500 to 2023-12-13T12:21:42-0500)   Patient had a min HR of 47 bpm, max HR of 188 bpm, and avg HR of  73 bpm. Predominant underlying rhythm was Sinus Rhythm. Atrial Fibrillation occurred (7% burden), ranging from 61-188 bpm (avg of 104 bpm), the longest lasting 5 hours 47 mins with an avg  rate of 88 bpm. Atrial Fibrillation was detected within +/- 45 seconds of symptomatic patient event(s). Isolated SVEs were rare (<1.0%), SVE Couplets were rare (<1.0%), and SVE Triplets were rare (<1.0%). Isolated VEs were rare (<1.0%), VE Couplets were  rare (<1.0%), and no VE Triplets were present. Ventricular Bigeminy was present.   TTE 11/2022: IMPRESSIONS   1. Left ventricular ejection fraction, by estimation, is 60 to 65%. The  left ventricle has normal function. The left ventricle has no regional  wall motion abnormalities. There is mild concentric left ventricular  hypertrophy. Left ventricular diastolic  parameters are consistent with Grade I diastolic dysfunction (impaired  relaxation). Elevated left atrial pressure. The average left ventricular  global longitudinal strain is -22.6 %. The global longitudinal strain is  normal.   2. Right ventricular systolic function is normal. The right ventricular  size is normal. There is normal pulmonary artery systolic pressure.   3. Left atrial size was mildly dilated.   4. The mitral valve is normal in structure. Mild to moderate mitral valve  regurgitation.   5. The aortic valve is tricuspid. Aortic valve regurgitation is not  visualized. No aortic stenosis is present.   6. Aortic dilatation noted. There is moderate dilatation of the aortic  root, measuring 45 mm. There is mild dilatation of the ascending aorta,  measuring 39 mm.   7. The inferior vena cava is normal in size with greater than 50%  respiratory variability, suggesting right atrial pressure of 3 mmHg.   Comparison(s): No significant change from prior study. Prior images  reviewed side by side.   CTA Chest/Aorta 01/09/2022: FINDINGS: Vascular Findings:   Stable mild fusiform  aneurysmal dilatation of the ascending thoracic aorta with measurements as follows. The thoracic aorta tapers to a normal caliber at the level of the aortic arch. The descending thoracic aorta is of normal caliber and widely patent without a hemodynamically significant stenosis. No evidence of thoracic aortic dissection or periaortic stranding on this nongated examination.   Conventional configuration of the aortic arch. The branch vessels of the aortic arch appear patent throughout their imaged courses.   Normal heart size. Scattered coronary artery calcifications. No pericardial effusion though small amount of fluid is seen within the pericardial recess.   Although this examination was not tailored for the evaluation the pulmonary arteries, there are no discrete filling defects within the central pulmonary arterial tree to suggest central pulmonary embolism. Normal caliber of the main pulmonary artery.   -------------------------------------------------------------   Thoracic aortic measurements:   SINOTUBULAR JUNCTION: 38 mm as measured in greatest oblique short axis coronal dimension.   PROXIMAL ASCENDING THORACIC AORTA: 42 mm as measured in greatest oblique short axis axial dimension at the level of the main pulmonary artery (image 59, series 4) and approximately 40 mm in greatest oblique short axis coronal diameter (coronal image 45, series 7), unchanged compared to the 04/2015 examination.   AORTIC ARCH: 33 mm as measured in greatest oblique short axis sagittal dimension.   PROXIMAL DESCENDING THORACIC AORTA: 30 mm as measured in greatest oblique short axis axial dimension at the level of the main pulmonary artery.   DISTAL DESCENDING THORACIC AORTA: 28 mm as measured in greatest oblique short axis axial dimension at the level of the diaphragmatic hiatus.   Review of the MIP  images confirms the above findings.    -------------------------------------------------------------   Non-Vascular Findings:   Mediastinum/Lymph Nodes: Redemonstrated calcified hilar and mediastinal lymph nodes, the sequela of previous granulomatous infection. No bulky mediastinal, hilar axillary lymphadenopathy.   Lungs/Pleura: Redemonstrated biapical pleuroparenchymal thickening, extending along the posterosuperior aspect of the right major fissure, similar to remote examination performed in 2016. No discrete focal airspace opacities. No pleural effusion or pneumothorax.   Scattered punctate pulmonary nodules are unchanged since the 04/2015 examination with index 5 mm right middle lobe pulmonary nodule seen on image 62, series 5 and additional pulmonary nodules seen on images 69 and 84, series 5. Imaging stability for greater than 2 years is indicative of a benign etiology. No worrisome new or enlarging pulmonary nodules.   Upper abdomen: Limited early arterial phase evaluation of the upper abdomen demonstrates diffuse decreased attenuation of the hepatic parenchyma suggestive of hepatic steatosis. Normal appearance of the gallbladder given degree distention. No radiopaque gallstones.   Musculoskeletal: No acute or aggressive osseous abnormalities. 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 Regional soft tissues appear normal. The thyroid gland appears slightly atrophic but without discrete nodule or mass.   IMPRESSION: 1. Stable uncomplicated mild fusiform aneurysmal dilatation of the ascending thoracic aorta measuring 42 mm in diameter, unchanged compared to the 04/2015 examination. Aortic aneurysm NOS (ICD10-I71.9). 2. Coronary artery calcifications. Aortic Atherosclerosis (ICD10-I70.0).  Coronary CTA: 10/22/2019   1. Coronary calcium score of 57. This was 59 percentile for age and sex matched control.   2. Normal coronary origin with right dominance.   3. CAD-RADS 1. Minimal  non-obstructive CAD (0-24%) in the proximal LAD. Consider non-atherosclerotic causes of chest pain. Consider preventive therapy and risk factor modification.   4. Mildly dilated aortic root at the sinus level with maximum diameter 43 mm, upper normal size of the ascending aorta measuring 40 mm. When compared to the prior study from 05/07/2018 there has been no change.  Echo 09/10/2019:  1. The left ventricle has normal systolic function with an ejection  fraction of 60-65%. The cavity size was normal. There is mild asymmetric  left ventricular hypertrophy. Left ventricular diastolic Doppler  parameters are consistent with impaired  relaxation. No evidence of left ventricular regional wall motion  abnormalities.   2. Mild Doppler evident acceleration of flow around distal outflow tract  septal hypertrophy (may result in murmur).   3. The right ventricle has normal systolic function. The cavity was  normal. There is No increase in right ventricular wall thickness.   4. Left atrial size was normal.   5. Right atrial size was normal.   6. No evidence of mitral valve stenosis.   7. The aortic valve is tricuspid. Mild thickening of the aortic valve.  Mild calcification of the aortic valve. Aortic valve regurgitation was not  visualized by color flow Doppler.   8. The pulmonic valve was grossly normal. Pulmonic valve regurgitation is not visualized by color flow Doppler.   9. The aorta is abnormal unless otherwise noted.  10. There is dilatation of the ascending aorta measuring 40 mm.  11. The average left ventricular global longitudinal strain is -22.7 %.   EKG:  EKG is personally reviewed. 01/01/2023:  EKG was not ordered. 11/22/2022 Ermalinda Barrios, PA-C):  NSR, minimal LVH  02/21/2022: No new ECG 09/16/2021 Laurann [place], NP): Sinus bradycardia, 57 bpm with no acute ST/T wave changes.  Recent Labs: 11/22/2022: ALT 20; BUN 19; Creatinine, Ser 0.67; Hemoglobin 13.8; Platelets 291;  Potassium 4.3; Sodium 143; TSH 1.500   Recent Lipid Panel    Component Value Date/Time   CHOL 174 12/27/2020 1216   TRIG 69 12/27/2020 1216   HDL 49 12/27/2020 1216   CHOLHDL 3.6 12/27/2020 1216   LDLCALC 112 (H) 12/27/2020 1216     Risk Assessment/Calculations:         Physical Exam:    VS:  BP 128/82   Pulse 61   Ht 6\' 3"  (1.905 m)   Wt 203 lb (92.1 kg)   SpO2 98%   BMI 25.37 kg/m     Wt Readings from Last 3 Encounters:  01/01/23 203 lb (92.1 kg)  11/22/22 204 lb 9.6 oz (92.8 kg)  11/22/22 203 lb (92.1 kg)     GEN: Well nourished, well developed in no acute distress HEENT: Normal NECK: No JVD; No carotid bruits CARDIAC: RRR, 2/6 systolic murmur RESPIRATORY:  Clear to auscultation without rales, wheezing or rhonchi  ABDOMEN: Soft, non-tender, non-distended MUSCULOSKELETAL:  No edema; No deformity  SKIN: Warm and dry. Cyst-like structure of right elbow. NEUROLOGIC:  Alert and oriented x 3 PSYCHIATRIC:  Normal affect   ASSESSMENT:    1. Paroxysmal atrial fibrillation (HCC)   2. Coronary artery disease involving native coronary artery of native heart without angina pectoris   3. Pure hypercholesterolemia   4. Aneurysm of ascending aorta without rupture (HCC)   5. Nonobstructive atherosclerosis of coronary artery   6. Secondary hypercoagulable state (HCC)     PLAN:    In order of problems listed above:  #Paroxysmal Afib: Noted on cardiac monitor 11/2022 with 7% burden. TTE with LVEF 60-65%, mild LVH, normal RV, no significant valve disease. TSH normal. Not currently taking apixaban but wiling to start today. Interested in seeing Dr. 12/2022 to discuss ablation vs watchman as he would like to stop [facility] if able. -Discussed starting apixaban 5mg  BID -Start metop 25mg  BID with additional dose as needed for palps -Refer to [facility] to discuss possible ablation vs watchman  #Nonobstructive CAD: Cardiac CTA 10/22/19 with coronary calcium score of 57 and minimal  nonobstructive disease <25% in LAD. Stable with no anginal symptoms. Remains very active.  -Continue crestor 5mg  three times per week -Goal LDL<70; LDL 67  #Ascending Aortic Aneurysm: #Strong Family History of Marfan Syndrome: Chest CTA 04/27/2018 ascending aortic dimension 41 mm and aortic root dimension at the sinuses level 43 mm. Chest CTA 09/2019 unchanged diameter at sense of a 43 mm well maximum diameter ascending aorta was 40 mm. Repeat CT 12/2021 with ascending aorta 85mm. -Continue annual monitoring -Avoid heavy lifting   #HLD: -Continue crestor 5mg  three times per week -Continue lifestyle modifications -Goal LDL<70  #Joint Pain: #Collapsing Arches: #Possible Piriformis Syndrome: -Follow-up with PCP as scheduled  #Mild to moderate MR: Noted on TTE 11/2022. -Continue serial monitoring in 2 years        Follow-up:  6 months.  Medication Adjustments/Labs and Tests Ordered: Current medicines are reviewed at length with the patient today.  Concerns regarding medicines are outlined above.   Orders Placed This Encounter  Procedures   Ambulatory referral to Cardiac Electrophysiology   Meds ordered this encounter  Medications   metoprolol tartrate (LOPRESSOR) 25 MG tablet    Sig: Take 1 tablet (25 mg total) by mouth 2 (two) times daily.    Dispense:  180 tablet    Refill:  2   Patient Instructions  Medication Instructions:   START TAKING METOPROLOL TARTRATE (LOPRESSOR) 25 MG BY  MOUTH TWICE DAILY  PLEASE START TAKING YOUR ELIQUIS AS PRESCRIBED WITH NO SKIPPED DOSES.   *If you need a refill on your cardiac medications before your next appointment, please call your pharmacy*   You have been referred to SEE DR. Quentin Ore ELECTROPHYSIOLOGY FOR CONSIDERATION OF ABLATION OR WATCHMAN   Follow-Up: At [facility], you and your health needs are our priority.  As part of our continuing mission to provide you with exceptional heart care, we have created designated  Provider Care Teams.  These Care Teams include your primary Cardiologist (physician) and Advanced Practice Providers (APPs -  Physician Assistants and Nurse Practitioners) who all work together to provide you with the care you need, when you need it.  We recommend signing up for the patient portal called "MyChart".  Sign up information is provided on this After Visit Summary.  MyChart is used to connect with patients for Virtual Visits (Telemedicine).  Patients are able to view lab/test results, encounter notes, upcoming appointments, etc.  Non-urgent messages can be sent to your provider as well.   To learn more about what you can do with MyChart, go to [email or web address].    Your next appointment:   6 month(s)  The format for your next appointment:   In Person  Provider:   Freada Bergeron, MD      Important Information About Sugar        I,Mathew Stumpf,acting as a scribe for Freada Bergeron, MD.,have documented all relevant documentation on the behalf of Freada Bergeron, MD,as directed by  Freada Bergeron, MD while in the presence of Freada Bergeron, MD.  I, Freada Bergeron, MD, have reviewed all documentation for this visit. The documentation on 01/01/23 for the exam, diagnosis, procedures, and orders are all accurate and complete.   Signed, Freada Bergeron, MD  01/01/2023 9:27 AM    [place]

## 2023-01-01 ENCOUNTER — Ambulatory Visit: Payer: Medicare PPO | Admitting: Cardiology

## 2023-01-01 ENCOUNTER — Ambulatory Visit: Payer: Medicare PPO | Attending: Cardiology | Admitting: Cardiology

## 2023-01-01 ENCOUNTER — Encounter: Payer: Self-pay | Admitting: Cardiology

## 2023-01-01 VITALS — BP 128/82 | HR 61 | Ht 75.0 in | Wt 203.0 lb

## 2023-01-01 DIAGNOSIS — E7800 Pure hypercholesterolemia, unspecified: Secondary | ICD-10-CM | POA: Diagnosis not present

## 2023-01-01 DIAGNOSIS — I480 Paroxysmal atrial fibrillation: Secondary | ICD-10-CM | POA: Diagnosis not present

## 2023-01-01 DIAGNOSIS — I2510 Atherosclerotic heart disease of native coronary artery without angina pectoris: Secondary | ICD-10-CM

## 2023-01-01 DIAGNOSIS — D6869 Other thrombophilia: Secondary | ICD-10-CM | POA: Diagnosis not present

## 2023-01-01 DIAGNOSIS — I7121 Aneurysm of the ascending aorta, without rupture: Secondary | ICD-10-CM

## 2023-01-01 MED ORDER — METOPROLOL TARTRATE 25 MG PO TABS: 25.0000 mg | ORAL_TABLET | Freq: Two times a day (BID) | ORAL | 2 refills | Status: DC

## 2023-01-01 NOTE — Patient Instructions (Signed)
Medication Instructions:   START TAKING METOPROLOL TARTRATE (LOPRESSOR) 25 MG BY MOUTH TWICE DAILY  PLEASE START TAKING YOUR ELIQUIS AS PRESCRIBED WITH NO SKIPPED DOSES.   *If you need a refill on your cardiac medications before your next appointment, please call your pharmacy*   You have been referred to SEE DR. Quentin Ore ELECTROPHYSIOLOGY FOR CONSIDERATION OF ABLATION OR WATCHMAN   Follow-Up: At [facility], you and your health needs are our priority.  As part of our continuing mission to provide you with exceptional heart care, we have created designated Provider Care Teams.  These Care Teams include your primary Cardiologist (physician) and Advanced Practice Providers (APPs -  Physician Assistants and Nurse Practitioners) who all work together to provide you with the care you need, when you need it.  We recommend signing up for the patient portal called "MyChart".  Sign up information is provided on this After Visit Summary.  MyChart is used to connect with patients for Virtual Visits (Telemedicine).  Patients are able to view lab/test results, encounter notes, upcoming appointments, etc.  Non-urgent messages can be sent to your provider as well.   To learn more about what you can do with MyChart, go to [email or web address].    Your next appointment:   6 month(s)  The format for your next appointment:   In Person  Provider:   Freada Bergeron, MD      Important Information About Sugar

## 2023-01-02 ENCOUNTER — Other Ambulatory Visit: Payer: Medicare PPO

## 2023-01-02 DIAGNOSIS — M7021 Olecranon bursitis, right elbow: Secondary | ICD-10-CM | POA: Diagnosis not present

## 2023-01-02 DIAGNOSIS — M25421 Effusion, right elbow: Secondary | ICD-10-CM | POA: Diagnosis not present

## 2023-01-02 DIAGNOSIS — Z6825 Body mass index (BMI) 25.0-25.9, adult: Secondary | ICD-10-CM | POA: Diagnosis not present

## 2023-01-03 DIAGNOSIS — M25539 Pain in unspecified wrist: Secondary | ICD-10-CM | POA: Diagnosis not present

## 2023-01-03 DIAGNOSIS — M1990 Unspecified osteoarthritis, unspecified site: Secondary | ICD-10-CM | POA: Diagnosis not present

## 2023-01-03 DIAGNOSIS — M25511 Pain in right shoulder: Secondary | ICD-10-CM | POA: Diagnosis not present

## 2023-01-03 DIAGNOSIS — M0609 Rheumatoid arthritis without rheumatoid factor, multiple sites: Secondary | ICD-10-CM | POA: Diagnosis not present

## 2023-01-03 DIAGNOSIS — Z79899 Other long term (current) drug therapy: Secondary | ICD-10-CM | POA: Diagnosis not present

## 2023-01-04 ENCOUNTER — Other Ambulatory Visit (HOSPITAL_COMMUNITY): Payer: Medicare PPO

## 2023-01-04 ENCOUNTER — Telehealth: Payer: Self-pay | Admitting: Family Medicine

## 2023-01-04 ENCOUNTER — Inpatient Hospital Stay: Admission: RE | Admit: 2023-01-04 | Payer: Medicare PPO | Source: Ambulatory Visit

## 2023-01-04 ENCOUNTER — Encounter: Payer: Self-pay | Admitting: Cardiology

## 2023-01-04 NOTE — Telephone Encounter (Signed)
Pt recd wrist brace at August visit. It is damaged due to use and pt would like another one ordered.  I was unsure if this was [place] or [place].

## 2023-01-04 NOTE — Telephone Encounter (Signed)
Sent patient message regarding brace.

## 2023-01-05 ENCOUNTER — Telehealth: Payer: Self-pay | Admitting: *Deleted

## 2023-01-05 DIAGNOSIS — I7121 Aneurysm of the ascending aorta, without rupture: Secondary | ICD-10-CM

## 2023-01-05 DIAGNOSIS — G4733 Obstructive sleep apnea (adult) (pediatric): Secondary | ICD-10-CM | POA: Diagnosis not present

## 2023-01-05 NOTE — Telephone Encounter (Signed)
New order for CT ANGIO CHEST AORTA placed for this pt to get done now.   Staff message sent to [facility] Scheduling to call the pt back and arrange this appt to be done sometime soon.

## 2023-01-05 NOTE — Telephone Encounter (Signed)
-----  Message from Armando Gang sent at 01/05/2023 10:26 AM EST ----- Regarding: RE: reschedule CT ANGIO CHEST AORTA PER DR. Johney Frame 01-18-23 @ [facility]  ----- Message ----- From: Nuala Alpha, LPN Sent: 3/49/1791   7:51 AM EST To: Armando Gang; [facility] Subject: reschedule CT ANGIO CHEST AORTA PER DR. PEMB#  This pts CT ANGIO CHEST AORTA got cancelled for yesterday.  He needs to have this done per Dr. Johney Frame.  Can you please call him and reschedule this appt to be done now.  He is aware you will call.  Shoot me the date thereafter?   Thanks [employer]

## 2023-01-05 NOTE — Telephone Encounter (Signed)
CT ANGIO CHEST AORTA is rescheduled to 01/18/23 at 5 pm.  Pt made aware of appt date and time by [facility] Scheduling.

## 2023-01-06 DIAGNOSIS — G4733 Obstructive sleep apnea (adult) (pediatric): Secondary | ICD-10-CM | POA: Diagnosis not present

## 2023-01-09 ENCOUNTER — Other Ambulatory Visit: Payer: Medicare PPO

## 2023-01-10 DIAGNOSIS — M7021 Olecranon bursitis, right elbow: Secondary | ICD-10-CM | POA: Diagnosis not present

## 2023-01-10 DIAGNOSIS — Z96652 Presence of left artificial knee joint: Secondary | ICD-10-CM | POA: Diagnosis not present

## 2023-01-18 ENCOUNTER — Ambulatory Visit (HOSPITAL_BASED_OUTPATIENT_CLINIC_OR_DEPARTMENT_OTHER)
Admission: RE | Admit: 2023-01-18 | Discharge: 2023-01-18 | Disposition: A | Discharge status: Home / Self Care | Payer: Medicare PPO | Source: Ambulatory Visit | Attending: Cardiology | Admitting: Cardiology

## 2023-01-18 DIAGNOSIS — S199XXA Unspecified injury of neck, initial encounter: Secondary | ICD-10-CM | POA: Diagnosis not present

## 2023-01-18 DIAGNOSIS — I7121 Aneurysm of the ascending aorta, without rupture: Secondary | ICD-10-CM | POA: Diagnosis not present

## 2023-01-18 DIAGNOSIS — S0003XA Contusion of scalp, initial encounter: Secondary | ICD-10-CM | POA: Diagnosis not present

## 2023-01-18 LAB — POCT I-STAT CREATININE: Creatinine, Ser: 0.9 mg/dL (ref 0.61–1.24)

## 2023-01-18 MED ORDER — IOHEXOL 350 MG/ML SOLN
80.0000 mL | Freq: Once | INTRAVENOUS | Status: AC | PRN
  Administered 2023-01-18: 80 mL via INTRAVENOUS

## 2023-01-19 ENCOUNTER — Telehealth: Payer: Self-pay | Admitting: *Deleted

## 2023-01-19 DIAGNOSIS — I7121 Aneurysm of the ascending aorta, without rupture: Secondary | ICD-10-CM

## 2023-01-19 NOTE — Telephone Encounter (Signed)
The patient has been notified of the result and verbalized understanding.  All questions (if any) were answered.  Pt aware that we will order for him to get this done again in one year for surveillance of his aorta.   He is aware that I will place the order in the system and have our [facility] Schedulers call him back to arrange for this appt for one year out.   Pt verbalized understanding and agrees with this plan.

## 2023-01-19 NOTE — Telephone Encounter (Signed)
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 Will continue with annual monitoring.

## 2023-01-25 DIAGNOSIS — S90212A Contusion of left great toe with damage to nail, initial encounter: Secondary | ICD-10-CM | POA: Diagnosis not present

## 2023-01-25 DIAGNOSIS — M76822 Posterior tibial tendinitis, left leg: Secondary | ICD-10-CM | POA: Diagnosis not present

## 2023-01-25 DIAGNOSIS — L600 Ingrowing nail: Secondary | ICD-10-CM | POA: Diagnosis not present

## 2023-01-26 ENCOUNTER — Other Ambulatory Visit: Payer: Self-pay

## 2023-01-26 MED ORDER — ROSUVASTATIN CALCIUM 5 MG PO TABS: 5.0000 mg | ORAL_TABLET | ORAL | 3 refills | Status: AC

## 2023-02-06 DIAGNOSIS — G4733 Obstructive sleep apnea (adult) (pediatric): Secondary | ICD-10-CM | POA: Diagnosis not present

## 2023-02-13 NOTE — Progress Notes (Unsigned)
Electrophysiology Office Note:    Date:  02/13/2023   ID:  Patrick Moyer, DOB [birth year], MRN [id number]  PCP:  Lawerance Cruel, MD  [facility] Cardiologist:  Freada Bergeron, MD  [facility] Electrophysiologist:  Vickie Epley, MD   Referring MD: Freada Bergeron, MD   Chief Complaint: Atrial fibrillation  History of Present Illness:    Patrick Moyer is a 72 y.o. adult who presents for an evaluation of atrial fibrillation at the request of Dr. Johney Frame.   Their medical history includes ascending aortic aneurysm, nonobstructive coronary artery disease, marfanoid habitus, hyperlipidemia.  The patient has worn a heart monitor in November 2023 showing a 7% burden of atrial fibrillation.  He is on Eliquis for stroke prophylaxis.  He is very active and is hesitant to continue anticoagulation given the risks of bleeding.  At that appointment with Dr. Johney Frame he reported only taking aspirin consistently.      Their past medical, social and family history was reveiwed.  Current Medications: No outpatient medications have been marked as taking for the 02/14/23 encounter (Office Visit) with Vickie Epley, MD.     Allergies:   Other, Cialis [tadalafil], Crestor [rosuvastatin], Soy protein [soybean oil], and Viagra [sildenafil]   ROS:   Please see the history of present illness.    All other systems reviewed and are negative.  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    Wt Readings from Last 3 Encounters:  01/01/23 203 lb (92.1 kg)  11/22/22 204 lb 9.6 oz (92.8 kg)  11/22/22 203 lb (92.1 kg)     GEN: *** Well nourished, well developed in no  acute distress CARDIAC: ***RRR, no murmurs, rubs, gallops RESPIRATORY:  Clear to auscultation without rales, wheezing or rhonchi       ASSESSMENT:    1. Paroxysmal atrial fibrillation (HCC)   2. Coronary artery disease involving native coronary artery of native heart without angina pectoris    PLAN:    In order of problems listed above:  #Paroxysmal atrial fibrillation Symptomatic 7% burden on recent monitor Discussed treatment strategies for atrial fibrillation during today's visit including continued conservative management, antiarrhythmic drug therapy and catheter ablation.  ***ablation  Discussed treatment options today for their AF including antiarrhythmic drug therapy and ablation. Discussed risks, recovery and likelihood of success. Discussed potential need for repeat ablation procedures and antiarrhythmic drugs after an initial ablation. They wish to proceed with scheduling.  Risk, benefits, and alternatives to EP study and radiofrequency ablation for afib were also discussed in detail today. These risks include but are not limited to stroke, bleeding, vascular damage, tamponade, perforation, damage to the esophagus, lungs, and other structures, pulmonary vein stenosis, worsening renal function, and death. The patient understands these risk and wishes to proceed.  We will therefore proceed with catheter ablation at the next available time.  Carto, ICE, anesthesia are requested for the procedure.  Will also obtain CT PV protocol prior to the procedure to exclude LAA thrombus and further evaluate atrial anatomy.  ------------------------  I have seen Patrick Moyer in the office today who is being considered for a  Watchman left atrial appendage closure device. I believe they will benefit from this procedure given their history of atrial fibrillation, CHA2DS2-VASc score of 2 and unadjusted ischemic stroke rate of 2.2% per year. Unfortunately, the patient is not felt to be a long  term anticoagulation candidate secondary to concerns over associated bleeding risk. The patient's chart has been reviewed and I feel that they would be a candidate for short term oral anticoagulation after Watchman implant.   It is my belief that after undergoing a LAA closure procedure, Patrick Moyer will not need long term anticoagulation which eliminates anticoagulation side effects and major bleeding risk.   Procedural risks for the Watchman implant have been reviewed with the patient including a 0.5% risk of stroke, <1% risk of perforation and <1% risk of device embolization. Other risks include bleeding, vascular damage, tamponade, worsening renal function, and death. The patient understands these risk and wishes to proceed.     The published clinical data on the safety and effectiveness of WATCHMAN include but are not limited to the following: - Holmes DR, Mechele Claude, Sick P et al. for the PROTECT AF Investigators. Percutaneous closure of the left atrial appendage versus warfarin therapy for prevention of stroke in patients with atrial fibrillation: a randomised non-inferiority trial. Lancet 2009; 374: 534-42. Mechele Claude, Doshi SK, Abelardo Diesel D et al. on behalf of the PROTECT AF Investigators. Percutaneous Left Atrial Appendage Closure for Stroke Prophylaxis in Patients With Atrial Fibrillation 2.3-Year Follow-up of the PROTECT AF (Watchman Left Atrial Appendage System for Embolic Protection in Patients With Atrial Fibrillation) Trial. Circulation 2013; 127:720-729. - Alli O, Doshi S,  Kar S, Reddy VY, Sievert H et al. Quality of Life Assessment in the Randomized PROTECT AF (Percutaneous Closure of the Left Atrial Appendage Versus Warfarin Therapy for Prevention of Stroke in Patients With Atrial Fibrillation) Trial of Patients at Risk for Stroke With Nonvalvular Atrial Fibrillation. J Am Coll Cardiol 2013; N8865744. Vertell Limber DR, Tarri Abernethy, Price M, [place], Sievert H, Doshi S, Huber K, Reddy  V. Prospective randomized evaluation of the Watchman left atrial appendage Device in patients with atrial fibrillation versus long-term warfarin therapy; the PREVAIL trial. Journal of the [employer] of Cardiology, Vol. 4, No. 1, 2014, 1-11. - Kar S, Doshi SK, Sadhu A, Horton R, Osorio J et al. Primary outcome evaluation of a next-generation left atrial appendage closure device: results from the PINNACLE FLX trial. Circulation 2021;143(18)1754-1762.    After today's visit with the patient which was dedicated solely for shared decision making visit regarding LAA closure device, the patient decided to proceed with the LAA appendage closure procedure scheduled to be done in the near future at [facility]. Prior to the procedure, I would like to obtain a gated CT scan of the chest with contrast timed for PV/LA visualization.    HAS-BLED score 2 Hypertension No  Abnormal renal and liver function (Dialysis, transplant, Cr >2.26 mg/dL /Cirrhosis or Bilirubin >2x Normal or AST/ALT/AP >3x Normal) No  Stroke No  Bleeding No  Labile INR (Unstable/high INR) No  Elderly (>65) Yes  Drugs or alcohol (? 8 drinks/week, anti-plt or NSAID) No   CHA2DS2-VASc Score = 2  The patient's score is based upon: CHF History: 0 HTN History: 0 Diabetes History: 0 Stroke History: 0 Vascular Disease History: 1 Age Score: 1 Gender Score: 0   Total time spent with patient today *** minutes. This includes reviewing records, evaluating the patient and coordinating care.  Medication Adjustments/Labs and Tests Ordered: Current medicines are reviewed at length with the patient today.  Concerns regarding medicines are outlined above.  No orders of the defined types were placed in this encounter.  No orders of the defined types were placed in this encounter.    Signed, Hilton Cork. Quentin Ore, MD, [facility], [facility] 02/13/2023 8:43 PM    Electrophysiology [facility]

## 2023-02-14 ENCOUNTER — Ambulatory Visit: Payer: Medicare PPO | Attending: Cardiology | Admitting: Cardiology

## 2023-02-14 ENCOUNTER — Encounter: Payer: Self-pay | Admitting: Cardiology

## 2023-02-14 VITALS — BP 134/84 | HR 54 | Ht 75.0 in | Wt 203.6 lb

## 2023-02-14 DIAGNOSIS — I2510 Atherosclerotic heart disease of native coronary artery without angina pectoris: Secondary | ICD-10-CM | POA: Diagnosis not present

## 2023-02-14 DIAGNOSIS — I480 Paroxysmal atrial fibrillation: Secondary | ICD-10-CM | POA: Diagnosis not present

## 2023-02-14 NOTE — Progress Notes (Signed)
Electrophysiology Office Note:    Date:  02/14/2023   ID:  Patrick Moyer, DOB 1951-10-27, MRN [id number]  PCP:  Lawerance Cruel, MD  [facility] Cardiologist:  Freada Bergeron, MD  [facility] Electrophysiologist:  Vickie Epley, MD   Referring MD: Freada Bergeron, MD   Chief Complaint: Atrial fibrillation  History of Present Illness:    Patrick Moyer is a 73 y.o. adult who presents for an evaluation of atrial fibrillation at the request of Dr. Johney Frame.   Their medical history includes ascending aortic aneurysm, nonobstructive coronary artery disease, marfanoid habitus, hyperlipidemia.  The patient has worn a heart monitor in November 2023 showing a 7% burden of atrial fibrillation.  He is on Eliquis for stroke prophylaxis.  He is very active and is hesitant to continue anticoagulation given the risks of bleeding.  At that appointment with Dr. Johney Frame he reported only taking aspirin consistently.  Today, he is accompanied by his wife. He complains of experiencing rapid heart beats during his a fib episodes. He reports that he 2 episodes lasting 45 minutes. His watch revealed burden of A fib values such as 7%, 15%, or 2% . He reports 4 spells a week and adds that triggers may include strong emotions. He does drink wine in moderation.   He only takes half of Metoprolol pill at night as he reports that he is unable to take the entire dose. He confirms that he takes Celebrex 200 MG daily. We discussed the catheter ablation procedure and watchman device implantation at great length.  He denies any chest pain, shortness of breath, or peripheral edema. No lightheadedness, headaches, syncope, orthopnea, or PND.  Their past medical, social and family history was reveiwed.  Current Medications: Current Meds  Medication Sig   acetaminophen (TYLENOL) 500 MG tablet Take 500 mg by mouth at bedtime.   apixaban (ELIQUIS) 5 MG TABS tablet TAKE 1 TABLET BY MOUTH TWICE A DAY    Ascorbic Acid (VITAMIN C) 1000 MG tablet Take 1,000 mg by mouth daily.   B Complex Vitamins (B COMPLEX 100 PO) Take 1 tablet by mouth in the morning and at bedtime.   Ca Carbonate-Mag Hydroxide (ROLAIDS PO) Take by mouth as needed.   celecoxib (CELEBREX) 200 MG capsule Take 200 mg by mouth 2 (two) times daily.   cholecalciferol (VITAMIN D) 1000 UNITS tablet Take 1,000 Units by mouth in the morning and at bedtime.   diclofenac Sodium (VOLTAREN) 1 % GEL Apply 1 Application topically 3 (three) times daily as needed (pain).   fexofenadine (ALLEGRA) 180 MG tablet Take 180 mg by mouth daily.   FIBER PO Take 1 each by mouth daily.   fluticasone (FLONASE) 50 MCG/ACT nasal spray Place 1 spray into both nostrils in the morning and at bedtime.   folic acid (FOLVITE) 1 MG tablet Take 1 mg by mouth daily.   glucosamine-chondroitin 500-400 MG tablet Take 1 tablet by mouth daily.   guaiFENesin (MUCINEX) 600 MG 12 hr tablet Take 1,200 mg by mouth 2 (two) times daily.   L-Lysine 500 MG CAPS Take 1 capsule by mouth in the morning and at bedtime.   Lutein-Zeaxanthin 25-5 MG CAPS Take 1 tablet by mouth daily.   methotrexate (RHEUMATREX) 2.5 MG tablet Take 10 mg by mouth daily.   metoprolol tartrate (LOPRESSOR) 25 MG tablet Take 1 tablet (25 mg total) by mouth 2 (two) times daily.   montelukast (SINGULAIR) 10 MG tablet Take 10 mg by mouth at  bedtime as needed.   Omega-3 Fatty Acids (FISH OIL) 1000 MG CAPS Take 1 capsule (1,000 mg total) by mouth in the morning and at bedtime.   omeprazole (PRILOSEC) 20 MG capsule Take 20 mg by mouth daily.   Potassium Gluconate 550 (90 K) MG TABS Take 1 tablet by mouth as directed. 5 tablets weekly.   rosuvastatin (CRESTOR) 5 MG tablet Take 1 tablet (5 mg total) by mouth 3 (three) times a week.   SODIUM FLUORIDE 5000 PPM 1.1 % GEL dental gel Take 1 application by mouth daily.   Turmeric 500 MG CAPS Take 500 mg by mouth daily.   valACYclovir (VALTREX) 1000 MG tablet Take 1,000  mg by mouth daily as needed (cold sore).     Allergies:   Other, Cialis [tadalafil], Crestor [rosuvastatin], Soy protein [soybean oil], and Viagra [sildenafil]   ROS:   Please see the history of present illness.    (+)palpitations All other systems reviewed and are negative.  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 Paroxysmal atrial fibrillation (HCC)   2. Coronary artery disease involving native coronary artery of native heart without angina pectoris    PLAN:    In order of problems listed above:  #Paroxysmal atrial fibrillation Symptomatic 7% burden on recent monitor Discussed treatment strategies for atrial fibrillation during today's visit including continued conservative management, antiarrhythmic drug therapy and catheter ablation.  Discussed treatment options today for their AF including antiarrhythmic drug therapy and ablation. Discussed risks, recovery and likelihood of success. Discussed potential need for repeat ablation procedures and antiarrhythmic drugs after an initial ablation. They wish to proceed with scheduling.  Risk, benefits, and alternatives to EP study and radiofrequency ablation for afib were also discussed in detail today. These risks include but  are not limited to stroke, bleeding, vascular damage, tamponade, perforation, damage to the esophagus, lungs, and other structures, pulmonary vein stenosis, worsening renal function, and death. The patient understands these risk and wishes to proceed.  We will therefore proceed with catheter ablation at the next available time.  Carto, ICE, anesthesia are requested for the procedure.  Will also obtain CT PV protocol prior to the procedure to exclude LAA thrombus and further evaluate atrial anatomy.  ------------------------  I have seen Darrol Jump in the office today who is being considered for a Watchman left atrial appendage closure device. I believe they will benefit from this procedure given their history of atrial fibrillation, CHA2DS2-VASc score of 2 and unadjusted ischemic stroke rate of 2.2% per year. Unfortunately, the patient is not felt to be a long term  anticoagulation candidate secondary to concerns over associated bleeding risk while on chronic NSAIDs. The patient's chart has been reviewed and I feel that they would be a candidate for short term oral anticoagulation after Watchman implant.   It is my belief that after undergoing a LAA closure procedure, Darrol Jump will not need long term anticoagulation which eliminates anticoagulation side effects and major bleeding risk.   Procedural risks for the Watchman implant have been reviewed with the patient including a 0.5% risk of stroke, <1% risk of perforation and <1% risk of device embolization. Other risks include bleeding, vascular damage, tamponade, worsening renal function, and death. The patient understands these risk and wishes to proceed.     The published clinical data on the safety and effectiveness of WATCHMAN include but are not limited to the following: - Holmes DR, Mechele Claude, Sick P et al. for the PROTECT AF Investigators. Percutaneous closure of the left atrial appendage versus warfarin therapy for prevention of stroke in  patients with atrial fibrillation: a randomised non-inferiority trial. Lancet 2009; 374: 534-42. Mechele Claude, Doshi SK, Abelardo Diesel D et al. on behalf of the PROTECT AF Investigators. Percutaneous Left Atrial Appendage Closure for Stroke Prophylaxis in Patients With Atrial Fibrillation 2.3-Year Follow-up of the PROTECT AF (Watchman Left Atrial Appendage System for Embolic Protection in Patients With Atrial Fibrillation) Trial. Circulation 2013; 127:720-729. - Alli O, Doshi S,  Kar S, Reddy VY, Sievert H et al. Quality of Life Assessment in the Randomized PROTECT AF (Percutaneous Closure of the Left Atrial Appendage Versus Warfarin Therapy for Prevention of Stroke in Patients With Atrial Fibrillation) Trial of Patients at Risk for Stroke With Nonvalvular Atrial Fibrillation. J Am Coll Cardiol 2013; N8865744. Vertell Limber DR, Tarri Abernethy, Price M, [place], Sievert H, Doshi S, Huber K, Reddy V. Prospective randomized evaluation of the Watchman left atrial appendage Device in patients with atrial fibrillation versus long-term warfarin therapy; the PREVAIL trial. Journal of the [employer] of Cardiology, Vol. 4, No. 1, 2014, 1-11. - Kar S, Doshi SK, Sadhu A, Horton R, Osorio J et al. Primary outcome evaluation of a next-generation left atrial appendage closure device: results from the PINNACLE FLX trial. Circulation 2021;143(18)1754-1762.    After today's visit with the patient which was dedicated solely for shared decision making visit regarding LAA closure device, the patient decided to proceed with the LAA appendage closure procedure scheduled to be done in the near future at [facility]. Prior to the procedure, I would like to obtain a gated CT scan of the chest with contrast timed for PV/LA visualization.    HAS-BLED score 2 Hypertension No  Abnormal renal and liver function (Dialysis, transplant, Cr >2.26 mg/dL /Cirrhosis or Bilirubin >2x Normal or AST/ALT/AP >3x Normal) No  Stroke No   Bleeding No  Labile INR (Unstable/high INR) No  Elderly (>65) Yes  Drugs or alcohol (? 8 drinks/week, anti-plt or NSAID) No   CHA2DS2-VASc Score = 2  The patient's score is based upon: CHF History: 0 HTN History: 0 Diabetes History: 0 Stroke History: 0 Vascular Disease History: 1 Age Score: 1 Gender Score: 0   Medication Adjustments/Labs and Tests Ordered: Current medicines are reviewed at length with the patient today.  Concerns regarding medicines are outlined above.  No orders of the defined types were placed in this encounter.  No orders of the defined types were placed in this encounter.   I,Mitra Faeizi,acting as a Education administrator for Vickie Epley, MD.,have documented  all relevant documentation on the behalf of Vickie Epley, MD,as directed by  Vickie Epley, MD while in the presence of Vickie Epley, MD.  I, Vickie Epley, MD, have reviewed all documentation for this visit. The documentation on 02/14/23 for the exam, diagnosis, procedures, and orders are all accurate and complete.   Signed, Hilton Cork. Quentin Ore, MD, [facility], [facility] 02/14/2023 2:36 PM    Electrophysiology [facility]

## 2023-02-14 NOTE — Patient Instructions (Addendum)
Medication Instructions:  Your physician recommends that you continue on your current medications as directed. Please refer to the Current Medication list given to you today.  *If you need a refill on your cardiac medications before your next appointment, please call your pharmacy*  Lab Work: BMET and CBC prior to CT scan  Testing/Procedures: Your physician has requested that you have cardiac CT. Cardiac computed tomography (CT) is a painless test that uses an x-ray machine to take clear, detailed pictures of your heart. For further information please visit [email or web address]. Please follow instruction sheet as given. We will call you to schedule you CT scan.   Your physician has recommended that you have an ablation. Catheter ablation is a medical procedure used to treat some cardiac arrhythmias (irregular heartbeats). During catheter ablation, a long, thin, flexible tube is put into a blood vessel in your groin (upper thigh), or neck. This tube is called an ablation catheter. It is then guided to your heart through the blood vessel. Radio frequency waves destroy small areas of heart tissue where abnormal heartbeats may cause an arrhythmia to start. Please see the instruction sheet given to you today. You are scheduled for Atrial Fibrillation Ablation on Thursday, September 19 with Dr. Lars Mage at 7:30AM. Please arrive at the Main Entrance A at [facility]: [place], [place] [id number] at 5:30 AM    Follow-Up: At [facility], you and your health needs are our priority.  As part of our continuing mission to provide you with exceptional heart care, we have created designated Provider Care Teams.  These Care Teams include your primary Cardiologist (physician) and Advanced Practice Providers (APPs -  Physician Assistants and Nurse Practitioners) who all work together to provide you with the care you need, when you need it.  Your next appointment:   6  month(s)  Provider:   You may see Vickie Epley, MD or one of the following Advanced Practice Providers on your designated Care Team:   Tommye Standard, [place] Beryle Beams" [place], [place], NP

## 2023-02-15 ENCOUNTER — Telehealth: Payer: Self-pay

## 2023-02-20 DIAGNOSIS — B351 Tinea unguium: Secondary | ICD-10-CM | POA: Diagnosis not present

## 2023-02-20 DIAGNOSIS — R2689 Other abnormalities of gait and mobility: Secondary | ICD-10-CM | POA: Diagnosis not present

## 2023-02-20 DIAGNOSIS — M76822 Posterior tibial tendinitis, left leg: Secondary | ICD-10-CM | POA: Diagnosis not present

## 2023-03-07 DIAGNOSIS — G4733 Obstructive sleep apnea (adult) (pediatric): Secondary | ICD-10-CM | POA: Diagnosis not present

## 2023-04-06 DIAGNOSIS — G4733 Obstructive sleep apnea (adult) (pediatric): Secondary | ICD-10-CM | POA: Diagnosis not present

## 2023-04-07 DIAGNOSIS — G4733 Obstructive sleep apnea (adult) (pediatric): Secondary | ICD-10-CM | POA: Diagnosis not present

## 2023-04-09 DIAGNOSIS — M1990 Unspecified osteoarthritis, unspecified site: Secondary | ICD-10-CM | POA: Diagnosis not present

## 2023-04-09 DIAGNOSIS — M25511 Pain in right shoulder: Secondary | ICD-10-CM | POA: Diagnosis not present

## 2023-04-09 DIAGNOSIS — Z79899 Other long term (current) drug therapy: Secondary | ICD-10-CM | POA: Diagnosis not present

## 2023-04-09 DIAGNOSIS — M0609 Rheumatoid arthritis without rheumatoid factor, multiple sites: Secondary | ICD-10-CM | POA: Diagnosis not present

## 2023-04-16 DIAGNOSIS — Z79899 Other long term (current) drug therapy: Secondary | ICD-10-CM | POA: Diagnosis not present

## 2023-04-16 DIAGNOSIS — M25511 Pain in right shoulder: Secondary | ICD-10-CM | POA: Diagnosis not present

## 2023-04-16 DIAGNOSIS — M0609 Rheumatoid arthritis without rheumatoid factor, multiple sites: Secondary | ICD-10-CM | POA: Diagnosis not present

## 2023-04-23 DIAGNOSIS — R682 Dry mouth, unspecified: Secondary | ICD-10-CM | POA: Diagnosis not present

## 2023-04-23 DIAGNOSIS — G4733 Obstructive sleep apnea (adult) (pediatric): Secondary | ICD-10-CM | POA: Diagnosis not present

## 2023-04-23 DIAGNOSIS — R351 Nocturia: Secondary | ICD-10-CM | POA: Diagnosis not present

## 2023-04-24 DIAGNOSIS — M25511 Pain in right shoulder: Secondary | ICD-10-CM | POA: Diagnosis not present

## 2023-04-30 DIAGNOSIS — M19011 Primary osteoarthritis, right shoulder: Secondary | ICD-10-CM | POA: Diagnosis not present

## 2023-04-30 DIAGNOSIS — M76822 Posterior tibial tendinitis, left leg: Secondary | ICD-10-CM | POA: Diagnosis not present

## 2023-05-02 DIAGNOSIS — M76822 Posterior tibial tendinitis, left leg: Secondary | ICD-10-CM | POA: Diagnosis not present

## 2023-05-02 DIAGNOSIS — M76821 Posterior tibial tendinitis, right leg: Secondary | ICD-10-CM | POA: Diagnosis not present

## 2023-05-07 DIAGNOSIS — G4733 Obstructive sleep apnea (adult) (pediatric): Secondary | ICD-10-CM | POA: Diagnosis not present

## 2023-05-17 ENCOUNTER — Other Ambulatory Visit: Payer: Self-pay | Admitting: *Deleted

## 2023-05-17 DIAGNOSIS — I4891 Unspecified atrial fibrillation: Secondary | ICD-10-CM

## 2023-05-17 MED ORDER — APIXABAN 5 MG PO TABS: 5.0000 mg | ORAL_TABLET | Freq: Two times a day (BID) | ORAL | 1 refills | Status: DC

## 2023-05-17 NOTE — Telephone Encounter (Signed)
Eliquis 5mg  refill request received. Patient is 72 years old, weight-92.4kg, Crea-0.90 on 01/18/23, Diagnosis-Afib, and last seen by Dr. Lalla Brothers on 02/14/23. Dose is appropriate based on dosing criteria. Will send in refill to requested pharmacy.

## 2023-05-24 ENCOUNTER — Ambulatory Visit: Payer: Medicare PPO | Admitting: Family Medicine

## 2023-05-24 ENCOUNTER — Encounter: Payer: Self-pay | Admitting: Family Medicine

## 2023-05-24 ENCOUNTER — Ambulatory Visit (INDEPENDENT_AMBULATORY_CARE_PROVIDER_SITE_OTHER): Payer: Medicare PPO

## 2023-05-24 ENCOUNTER — Telehealth: Payer: Self-pay | Admitting: *Deleted

## 2023-05-24 VITALS — BP 152/82 | HR 53 | Ht 75.0 in | Wt 209.0 lb

## 2023-05-24 DIAGNOSIS — M25552 Pain in left hip: Secondary | ICD-10-CM

## 2023-05-24 DIAGNOSIS — M1990 Unspecified osteoarthritis, unspecified site: Secondary | ICD-10-CM | POA: Diagnosis not present

## 2023-05-24 DIAGNOSIS — M25511 Pain in right shoulder: Secondary | ICD-10-CM | POA: Diagnosis not present

## 2023-05-24 DIAGNOSIS — M0609 Rheumatoid arthritis without rheumatoid factor, multiple sites: Secondary | ICD-10-CM | POA: Diagnosis not present

## 2023-05-24 DIAGNOSIS — G8929 Other chronic pain: Secondary | ICD-10-CM

## 2023-05-24 DIAGNOSIS — M5416 Radiculopathy, lumbar region: Secondary | ICD-10-CM | POA: Diagnosis not present

## 2023-05-24 NOTE — Telephone Encounter (Signed)
   Pre-operative Risk Assessment    Patient Name: Patrick Moyer  DOB: 09/29/1951 MRN: [id number]      Request for Surgical Clearance    Procedure:   LUMBAR EPIDURAL  Date of Surgery:  Clearance TBD                                 Surgeon:   Surgeon's Group or Practice Name:  [facility] Phone number:  [phone] Fax number:  [phone]   Type of Clearance Requested:   - Pharmacy:  Hold Apixaban (Eliquis) 4 DOSES OF ELIQUIS IT SAYS    Type of Anesthesia:  Not Indicated   Additional requests/questions:    Wilhemina Cash   05/24/2023, 11:31 AM

## 2023-05-24 NOTE — Patient Instructions (Signed)
Thank you for coming in today.   Please get an Xray today before you leave   Please call [facility] at [phone] to schedule your spine injection.    We will get the records on your shoulder from [facility]  Find a physical therapy location and let me know

## 2023-05-24 NOTE — Progress Notes (Signed)
Rubin Payor, PhD, LAT, ATC acting as a scribe for Clementeen Graham, MD.  Patrick Moyer is a 72 y.o. adult who presents to [facility] at [facility] today for L thigh pain. Pt was previously seen by Dr. Katrinka Blazing on 11/22/22 for SI joint pain.   Today, pt c/o L hip pain x 1 month. He lives in a [facility] community and was doing an exercise class. Pt locates pain to the anterior aspect of the L hip. Pain is keeping him up at night.   He also radiating pain into the lateral aspect of the R lower leg. He has tightness in his R buttock.   Aggravates: hip flexion, figure-4 Treatments tried: massage  He has done very well in the past for his hip girdle related muscle dysfunction pain and weakness with initially physical therapy and subsequently home exercise program.  He is very interested in pursuing this further.  He is leaving on June 12 for 3 months for the summer at [place]  Dx imaging: 10/16/22 L-spine XR & L-spine MRI  Pertinent review of systems: No fevers or chills  Relevant historical information: Hypermobility.  Left knee replacement. On Eliquis due to A-fib.  Exam:  BP (!) 152/82   Pulse (!) 53   Ht 6\' 3"  (1.905 m)   Wt 209 lb (94.8 kg)   SpO2 94%   BMI 26.12 kg/m  General: Well Developed, well nourished, and in no acute distress.   MSK: L-spine nontender. Normal motion. Left hip normal-appearing Normal motion.  Some pain with flexion and internal rotation. Hip abduction and external rotation strength are mildly diminished and painful. Positive crossover stretch test and positive figure 4 stretch test.    Lab and Radiology Results  X-ray images left hip obtained today personally and independently interpreted. 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  No acute fractures.  Multilevel degenerative changes lower portion lumbar spine visible on x-ray. Await formal radiology  review   EXAM: MRI LUMBAR SPINE WITHOUT CONTRAST   TECHNIQUE: Multiplanar, multisequence MR imaging of the lumbar spine was performed. No intravenous contrast was administered.   COMPARISON:  Lumbar radiographs 10/16/2022   FINDINGS: Segmentation:  5 lumbar vertebra.  Lowest disc space L5-S1   Alignment: Mild retrolisthesis L1-2 and L2-3. Mild retrolisthesis L5-S1.   Vertebrae:  Negative for fracture or mass   Conus medullaris and cauda equina: Conus extends to the L1-2 level. Conus and cauda equina appear normal.   Paraspinal and other soft tissues: Negative for paraspinous mass or adenopathy   Disc levels:   L1-2: Mild retrolisthesis. Shallow central disc protrusion with small extruded disc fragment extending cranially. No significant stenosis   L2-3: Mild retrolisthesis. Mild disc bulging and mild facet degeneration. Mild subarticular stenosis on the left. Central canal patent.   L3-4: Disc degeneration with diffuse disc bulging and bilateral facet hypertrophy. Central canal patent. Mild subarticular stenosis bilaterally central canal adequately patent.   L4-5: Disc degeneration with disc space narrowing and endplate spurring asymmetric to the right. Bilateral facet degeneration right greater than left. Moderate to severe subarticular and foraminal stenosis on the right. Mild left subarticular stenosis. Central canal adequately patent.   L5-S1: Moderate to advanced disc degeneration with disc space narrowing and endplate spurring. Endplate sclerosis. Bilateral facet hypertrophy. Moderate subarticular and foraminal stenosis bilaterally.   IMPRESSION: 1. Multilevel degenerative change throughout the lumbar spine as described above. 2. Moderate to severe subarticular and foraminal  stenosis on the right L4-5 due to spurring. 3. Moderate subarticular and foraminal stenosis bilaterally L5-S1 due to spurring.     Electronically Signed   By: Marlan Palau  M.D.   On: 10/17/2022 14:06 I, Clementeen Graham, personally (independently) visualized and performed the interpretation of the images attached in this note.   Assessment and Plan: 72 y.o. adult with left anterior hip posterior hip pain and pain radiating lateral calf both lower legs. Pain is multifactorial very likely.  I think it is very likely that he has a fair amount of muscle weakness and dysfunction of the hip abductors rotators and perhaps hip flexors.  He is an ideal candidate for retrial of physical therapy.  However he is leading to living candidate for the next 3 months.  It is possible we could arrange for physical therapy near where he will be in [place].  I have given him a homework assignment to look where he is going to be living and try calling some physical therapy clinics and seeing if they will see him and if I can place a referral.  I am not sure if if this is possible.  I have never tried for someone in [place].  In the meantime proceed to epidural steroid injection likely targeting L5 nerve roots.  Consider intra-articular hip injection before he leaves.  PDMP not reviewed this encounter. Orders Placed This Encounter  Procedures   DG HIP UNILAT W OR W/O PELVIS 2-3 VIEWS LEFT    Standing Status:   Future    Number of Occurrences:   1    Standing Expiration Date:   06/24/2023    Order Specific Question:   Reason for Exam (SYMPTOM  OR DIAGNOSIS REQUIRED)    Answer:   left hip pain    Order Specific Question:   Preferred imaging location?    Answer:   [facility]   DG INJECT DIAG/THERA/INC NEEDLE/CATH/PLC EPI/LUMB/SAC W/IMG    Symptoms along the L5 pattern Level and technique per radiology    Standing Status:   Future    Standing Expiration Date:   06/24/2023    Order Specific Question:   Reason for Exam (SYMPTOM  OR DIAGNOSIS REQUIRED)    Answer:   Low back pain    Order Specific Question:   Preferred Imaging Location?    Answer:   [facility]    No orders of the defined types were placed in this encounter.    Discussed warning signs or symptoms. Please see discharge instructions. Patient expresses understanding.   The above documentation has been reviewed and is accurate and complete Clementeen Graham, M.D.

## 2023-05-27 NOTE — Telephone Encounter (Signed)
Patient with diagnosis of atrial fibrillation on Eliquis for anticoagulation.    Procedure: lumbar epidural Date of procedure: TBD   CHA2DS2-VASc Score = 2   This indicates a 2.2% annual risk of stroke. The patient's score is based upon: CHF History: 0 HTN History: 0 Diabetes History: 0 Stroke History: 0 Vascular Disease History: 1 Age Score: 1 Gender Score: 0    CrCl 138 Platelet count 291  Per office protocol, patient can hold Eliquis for 3 days prior to procedure.   Patient will not need bridging with Lovenox (enoxaparin) around procedure.  **This guidance is not considered finalized until pre-operative APP has relayed final recommendations.**

## 2023-05-28 ENCOUNTER — Ambulatory Visit: Payer: Medicare PPO | Attending: Student | Admitting: Student

## 2023-05-28 ENCOUNTER — Telehealth: Payer: Self-pay

## 2023-05-28 DIAGNOSIS — Z01810 Encounter for preprocedural cardiovascular examination: Secondary | ICD-10-CM

## 2023-05-28 NOTE — Telephone Encounter (Signed)
Patient scheduled for tele visit today. Pt states that he is going to [place] next week is going to try and get procedure done this week if possible

## 2023-05-28 NOTE — Telephone Encounter (Signed)
   Name: Patrick Moyer  DOB: Jul 19, 1951  MRN: [id number]  Primary Cardiologist: Meriam Sprague, MD   Preoperative team, please contact this patient and set up a phone call appointment for further preoperative risk assessment. Please obtain consent and complete medication review. Thank you for your help.  I confirm that guidance regarding antiplatelet and oral anticoagulation therapy has been completed and, if necessary, noted below.  Patient with diagnosis of atrial fibrillation on Eliquis for anticoagulation.     Procedure: lumbar epidural Date of procedure: TBD     CHA2DS2-VASc Score = 2   This indicates a 2.2% annual risk of stroke. The patient's score is based upon: CHF History: 0 HTN History: 0 Diabetes History: 0 Stroke History: 0 Vascular Disease History: 1 Age Score: 1 Gender Score: 0     CrCl 138 Platelet count 291   Per office protocol, patient can hold Eliquis for 3 days prior to procedure.   Patient will not need bridging with Lovenox (enoxaparin) around procedure.   Carlos Levering, NP 05/28/2023, 12:10 PM  [facility]

## 2023-05-28 NOTE — Progress Notes (Signed)
Virtual Visit via Telephone Note   Because of YAMA MAFI co-morbid illnesses, Patrick Moyer "Ed" is at least at moderate risk for complications without adequate follow up.  This format is felt to be most appropriate for this patient at this time.  The patient did not have access to video technology/had technical difficulties with video requiring transitioning to audio format only (telephone).  All issues noted in this document were discussed and addressed.  No physical exam could be performed with this format.  Please refer to the patient's chart for Patrick Moyer "Ed"'s consent to telehealth for Patrick Moyer.  Evaluation Performed:  Preoperative cardiovascular risk assessment _____________   Date:  05/28/2023   Patient ID:  Patrick Moyer, DOB May 04, 1951, MRN [id number] Patient Location:  Home Provider location:   Office  Primary Care Provider:  Daisy Floro, MD Primary Cardiologist:  Patrick Sprague, MD  Chief Complaint / Patient Profile   72 y.o. y/o adult with a h/o nonobstructive CAD, ascending aortic aneurysm, marfanoid habitus, hyperlipidemia, PAF on anticoagulation who is pending lumbar epidural by [facility] imaging and presents today for telephonic preoperative cardiovascular risk assessment.  History of Present Illness    Patrick Moyer is a 72 y.o. adult who presents via audio/video conferencing for a telehealth visit today.  Pt was last seen in cardiology clinic on 02/14/2023 by Dr. Lalla Moyer.  At that time Patrick Moyer was doing well.  The patient is now pending procedure as outlined above. Since Patrick Moyer "Ed"'s last visit, Patrick Moyer "Ed" is doing well from a cardiac standpoint. Patient denies shortness of breath or dyspnea on exertion. No chest pain, pressure, or tightness. Denies lower extremity edema, orthopnea, or PND. No palpitations.  His activity has been somewhat limited since injuring his back.  He is normally very active swimming  twice a week, walking frequently, participating in chair boxing/chair yoga 1-2 times a week.  He is able to walk 2 flights of stairs daily without chest pain or shortness of breath.  Past Medical History    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 72   per pt treated with INH for one year and cxr's since have been normal   HSV-1 infection    Marfanoid habitus    per pt has wing span is longer than his height, effects eyes (iris), hyperflexibility of ankle and hands,  AAA   Mild CAD    cardiologist--  dr Delton See---  CTA 10-22-2019   Non-celiac gluten sensitivity    Numbness and tingling in both hands    per pt intermittantly due to cervical spine   OSA on CPAP    auto 8-10,  pt stated uses every night   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)   Pulmonary nodules    per pt followed by pcp (  CTA and CT chest 10-22-2019 , stable)   Thoracic ascending aortic aneurysm [facility])    cardiologist--- dr Eloy End--- CTA 10-22-2019  ascending aorta 40mm and aortic root 43mm   Wears glasses    Past Surgical History:  Procedure Laterality Date   FINGER EXPLORATION Left 1995   left middle finger digital nerve repair   INGUINAL HERNIA REPAIR Left 1978   LAPAROSCOPIC APPENDECTOMY  2010   left knee arthroscopy      NASAL SEPTUM SURGERY  1975   TOTAL KNEE ARTHROPLASTY Left 08/29/2022   Procedure: UNICOMPARTMENTAL KNEE converted to a total knee;  Surgeon:  Teryl Lucy, MD;  Location: [facility];  Service: Orthopedics;  Laterality: Left;  with block   TRANSURETHRAL RESECTION OF PROSTATE N/A 05/05/2020   Procedure: TRANSURETHRAL RESECTION OF THE PROSTATE (TURP), BIPOLAR;  Surgeon: Rene Paci, MD;  Location: [facility];  Service: Urology;  Laterality: N/A;   WISDOM TOOTH EXTRACTION  1975    Allergies  Allergies  Allergen Reactions   Other     Adhesive, Caffene-abd pain   Cialis [Tadalafil]     Caused acute angle glaucoma   Crestor [Rosuvastatin] Other (See Comments)    Causes joint aches   Soy Protein [Soybean Oil]     Gastrointestinal upset   Viagra [Sildenafil]     Caused acute angle glaucoma    Home Medications    Prior to Admission medications   Medication Sig Start Date End Date Taking? Authorizing Provider  acetaminophen (TYLENOL) 500 MG tablet Take 500 mg by mouth at bedtime.    [provider]  apixaban (ELIQUIS) 5 MG TABS tablet Take 1 tablet (5 mg total) by mouth 2 (two) times daily. 05/17/23   Patrick Sprague, MD  Ascorbic Acid (VITAMIN C) 1000 MG tablet Take 1,000 mg by mouth daily.    [provider]  B Complex Vitamins (B COMPLEX 100 PO) Take 1 tablet by mouth in the morning and at bedtime.    [provider]  Ca Carbonate-Mag Hydroxide (ROLAIDS PO) Take by mouth as needed.    [provider]  celecoxib (CELEBREX) 200 MG capsule Take 200 mg by mouth 2 (two) times daily.    [provider]  cholecalciferol (VITAMIN D) 1000 UNITS tablet Take 1,000 Units by mouth in the morning and at bedtime.    [provider]  diclofenac Sodium (VOLTAREN) 1 % GEL Apply 1 Application topically 3 (three) times daily as needed (pain).    [provider]  fexofenadine (ALLEGRA) 180 MG tablet Take 180 mg by mouth daily. 05/17/21   [provider]  FIBER PO Take 1 each by mouth daily.    [provider]  fluticasone (FLONASE) 50  MCG/ACT nasal spray Place 1 spray into both nostrils in the morning and at bedtime.    [provider]  folic acid (FOLVITE) 1 MG tablet Take 1 mg by mouth daily.    [provider]  glucosamine-chondroitin 500-400 MG tablet Take 1 tablet by mouth daily.    [provider]  guaiFENesin (MUCINEX) 600 MG 12 hr tablet Take 1,200 mg by mouth 2 (two) times daily.    [provider]  L-Lysine 500 MG CAPS Take 1 capsule by mouth in the morning and at bedtime.    [provider]  Lutein-Zeaxanthin 25-5 MG CAPS Take 1 tablet by mouth daily. 05/01/22   Ronney Asters, NP  methotrexate (RHEUMATREX) 2.5 MG tablet Take 10 mg  by mouth daily. 03/13/22   [provider]  metoprolol tartrate (LOPRESSOR) 25 MG tablet Take 1 tablet (25 mg total) by mouth 2 (two) times daily. Patient taking differently: Take 12.5 mg by mouth 2 (two) times daily. 01/01/23   Patrick Sprague, MD  montelukast (SINGULAIR) 10 MG tablet Take 10 mg by mouth at bedtime as needed.    [provider]  omeprazole (PRILOSEC) 20 MG capsule Take 20 mg by mouth daily.    [provider]  Potassium Gluconate 550 (90 K) MG TABS Take 1 tablet by mouth as directed. 5 tablets weekly. Patient not taking: Reported on 05/28/2023 05/01/22   Ronney Asters, NP  rosuvastatin (CRESTOR) 5 MG tablet Take 1 tablet (5 mg total) by mouth 3 (three) times a week. 01/26/23   Patrick Sprague, MD  SODIUM FLUORIDE 5000 PPM 1.1 % GEL dental gel Take 1 application by mouth daily. 02/01/22   [provider]  Turmeric 500 MG CAPS Take 500 mg by mouth daily.    [provider]  valACYclovir (VALTREX) 1000 MG tablet Take 1,000 mg by mouth daily as needed (cold sore).    [provider]    Physical Exam    Vital Signs:  Patrick Moyer does not have vital signs available for review today.  Given telephonic nature of communication, physical exam is limited. AAOx3. NAD. Normal  affect.  Speech and respirations are unlabored.  Accessory Clinical Findings    None  Assessment & Plan    Primary Cardiologist: Patrick Sprague, MD  Preoperative cardiovascular risk assessment.  Lumbar epidural by [facility] imaging.  Chart reviewed as part of pre-operative protocol coverage. According to the RCRI, patient has a 0.4% risk of MACE. Patient reports activity equivalent to >4.0 METS (swims 2 days a week, walks frequently, chair boxing/chair yoga 1-2 times a week, able to climb 2 flights of stairs daily).   Given past medical history and time since last visit, based on ACC/AHA guidelines, Patrick Moyer would be at acceptable risk for the planned procedure without further cardiovascular testing.   Patient was advised that if Patrick Moyer "Ed" develops new symptoms prior to surgery to contact our office to arrange a follow-up appointment.  Patrick Moyer "Ed" verbalized understanding.  2. SBE/Anti-coag.  Per Pharm.D.: Patient with diagnosis of atrial fibrillation on Eliquis for anticoagulation.     Procedure: lumbar epidural Date of procedure: TBD     CHA2DS2-VASc Score = 2   This indicates a 2.2% annual risk of stroke. The patient's score is based upon: CHF History: 0 HTN History: 0 Diabetes History: 0 Stroke History: 0 Vascular Disease History: 1 Age Score: 1 Gender Score: 0     CrCl 138 Platelet count 291   Per office protocol, patient can hold Eliquis for 3 days prior to procedure.   Patient will not need bridging with Lovenox (enoxaparin) around procedure.   I will route this recommendation to the requesting party via Epic fax function.  Please call with questions.  Time:   Today, I have spent 5 minutes with the patient with telehealth technology discussing medical history, symptoms, and management plan.     Patrick Levering, NP  05/28/2023, 1:05 PM

## 2023-05-28 NOTE — Telephone Encounter (Signed)
  Patient Consent for Virtual Visit         Patrick Moyer has provided verbal consent on 05/28/2023 for a virtual visit (video or telephone).   CONSENT FOR VIRTUAL VISIT FOR:  Patrick Moyer  By participating in this virtual visit I agree to the following:  I hereby voluntarily request, consent and authorize [facility] and its employed or contracted physicians, physician assistants, nurse practitioners or other licensed health care professionals (the Practitioner), to provide me with telemedicine health care services (the "Services") as deemed necessary by the treating Practitioner. I acknowledge and consent to receive the Services by the Practitioner via telemedicine. I understand that the telemedicine visit will involve communicating with the Practitioner through live audiovisual communication technology and the disclosure of certain medical information by electronic transmission. I acknowledge that I have been given the opportunity to request an in-person assessment or other available alternative prior to the telemedicine visit and am voluntarily participating in the telemedicine visit.  I understand that I have the right to withhold or withdraw my consent to the use of telemedicine in the course of my care at any time, without affecting my right to future care or treatment, and that the Practitioner or I may terminate the telemedicine visit at any time. I understand that I have the right to inspect all information obtained and/or recorded in the course of the telemedicine visit and may receive copies of available information for a reasonable fee.  I understand that some of the potential risks of receiving the Services via telemedicine include:  Delay or interruption in medical evaluation due to technological equipment failure or disruption; Information transmitted may not be sufficient (e.g. poor resolution of images) to allow for appropriate medical decision making by the Practitioner;  and/or  In rare instances, security protocols could fail, causing a breach of personal health information.  Furthermore, I acknowledge that it is my responsibility to provide information about my medical history, conditions and care that is complete and accurate to the best of my ability. I acknowledge that Practitioner's advice, recommendations, and/or decision may be based on factors not within their control, such as incomplete or inaccurate data provided by me or distortions of diagnostic images or specimens that may result from electronic transmissions. I understand that the practice of medicine is not an exact science and that Practitioner makes no warranties or guarantees regarding treatment outcomes. I acknowledge that a copy of this consent can be made available to me via my patient portal [facility] MyChart), or I can request a printed copy by calling the office of  [facility].    I understand that my insurance will be billed for this visit.   I have read or had this consent read to me. I understand the contents of this consent, which adequately explains the benefits and risks of the Services being provided via telemedicine.  I have been provided ample opportunity to ask questions regarding this consent and the Services and have had my questions answered to my satisfaction. I give my informed consent for the services to be provided through the use of telemedicine in my medical care

## 2023-05-28 NOTE — Telephone Encounter (Signed)
This encounter was created in error - please disregard.

## 2023-05-29 NOTE — Progress Notes (Signed)
Hip x-ray shows medium arthritis.  I certainly could do a hip injection before you leave if you would like.

## 2023-06-04 ENCOUNTER — Telehealth: Payer: Self-pay | Admitting: Family Medicine

## 2023-06-04 ENCOUNTER — Ambulatory Visit
Admission: RE | Admit: 2023-06-04 | Discharge: 2023-06-04 | Disposition: A | Discharge status: Home / Self Care | Payer: Medicare PPO | Source: Ambulatory Visit | Attending: Family Medicine | Admitting: Family Medicine

## 2023-06-04 DIAGNOSIS — M5416 Radiculopathy, lumbar region: Secondary | ICD-10-CM

## 2023-06-04 DIAGNOSIS — M4726 Other spondylosis with radiculopathy, lumbar region: Secondary | ICD-10-CM | POA: Diagnosis not present

## 2023-06-04 MED ORDER — METHYLPREDNISOLONE ACETATE 40 MG/ML INJ SUSP (RADIOLOG
80.0000 mg | Freq: Once | INTRAMUSCULAR | Status: AC
  Administered 2023-06-04: 80 mg via EPIDURAL

## 2023-06-04 MED ORDER — IOPAMIDOL (ISOVUE-M 200) INJECTION 41%
1.0000 mL | Freq: Once | INTRAMUSCULAR | Status: AC
  Administered 2023-06-04: 1 mL via EPIDURAL

## 2023-06-04 NOTE — Discharge Instructions (Signed)

## 2023-06-04 NOTE — Telephone Encounter (Signed)
Can you check with Dr. Denyse Amass on this? 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

## 2023-06-04 NOTE — Telephone Encounter (Signed)
Pt states Dr. Denyse Amass was to get records from [facility] to review issues with his R shoulder. Pt called to see if we recd those records and what Dr. Zollie Pee thoughts were.

## 2023-06-04 NOTE — Telephone Encounter (Signed)
Forwarding to Dr. Corey.  

## 2023-06-06 NOTE — Telephone Encounter (Signed)
I have not received medical records yet

## 2023-06-07 DIAGNOSIS — G4733 Obstructive sleep apnea (adult) (pediatric): Secondary | ICD-10-CM | POA: Diagnosis not present

## 2023-06-07 NOTE — Telephone Encounter (Signed)
Records received today via fax.

## 2023-06-08 NOTE — Telephone Encounter (Signed)
Notes placed on Dr. Zollie Pee desk to review.

## 2023-06-17 DIAGNOSIS — G4733 Obstructive sleep apnea (adult) (pediatric): Secondary | ICD-10-CM | POA: Diagnosis not present

## 2023-06-25 ENCOUNTER — Telehealth: Payer: Self-pay | Admitting: Cardiology

## 2023-06-25 DIAGNOSIS — I480 Paroxysmal atrial fibrillation: Secondary | ICD-10-CM

## 2023-06-25 NOTE — Telephone Encounter (Signed)
Pt would like a callback regarding scheduling Ablation. Please advise 

## 2023-06-25 NOTE — Telephone Encounter (Signed)
Pt is scheduled for Afib Ablation with Dr. Lalla Brothers on 09/13/23 at 7:30 AM. He will have labs drawn on 09/04/23 at [facility] location.   Instruction letters will be sent to pt via MyChart per his request.

## 2023-07-07 DIAGNOSIS — G4733 Obstructive sleep apnea (adult) (pediatric): Secondary | ICD-10-CM | POA: Diagnosis not present

## 2023-07-16 ENCOUNTER — Encounter: Payer: Self-pay | Admitting: Family Medicine

## 2023-07-16 ENCOUNTER — Other Ambulatory Visit: Payer: Self-pay

## 2023-07-16 DIAGNOSIS — M5416 Radiculopathy, lumbar region: Secondary | ICD-10-CM

## 2023-07-17 ENCOUNTER — Telehealth: Payer: Self-pay | Admitting: *Deleted

## 2023-07-17 NOTE — Telephone Encounter (Signed)
   Patient Name: Patrick Moyer  DOB: 12/12/1951 MRN: [id number]  Primary Cardiologist: Meriam Sprague, MD  Clinical pharmacists have reviewed the patient's past medical history, labs, and current medications as part of preoperative protocol coverage. The following recommendations have been made:   We received clearance request for same procedure on 05/24/23 phone note. Not sure if he's already having another lumbar epidural or if the initial procedure was postponed. Ok to proceed with prior clearance rec - pt can hold Eliquis for 3 days prior to procedure. Of note, he does have upcoming afib ablation 10/19/23 and cannot hold anticoag for 3 weeks prior to or 3 months post procedure, so lumbar epidural will need to be scheduled outside of this window.   I will route this recommendation to the requesting party via Epic fax function and remove from pre-op pool.  Please call with questions.  Joylene Grapes, NP 07/17/2023, 11:42 AM

## 2023-07-17 NOTE — Telephone Encounter (Signed)
We received clearance request for same procedure on 05/24/23 phone note. Not sure if he's already having another lumbar epidural or if the initial procedure was postponed. Ok to proceed with prior clearance rec - pt can hold Eliquis for 3 days prior to procedure. Of note, he does have upcoming afib ablation 10/19/23 and cannot hold anticoag for 3 weeks prior to or 3 months post procedure, so lumbar epidural will need to be scheduled outside of this window.

## 2023-07-17 NOTE — Telephone Encounter (Signed)
    Pre-operative Risk Assessment    Patient Name: Patrick Moyer  DOB: [birth year] MRN: [id number]        Request for Surgical Clearance     Procedure:   LUMBAR EPIDURAL   Date of Surgery:  Clearance TBD                                 Surgeon:   Surgeon's Group or Practice Name:  [facility] Phone number:  [phone] Fax number:  [phone]   Type of Clearance Requested:   - Pharmacy:  Hold Apixaban (Eliquis) x's 2 DAYS4 DOSES OF ELIQUIS IT SAYS    Type of Anesthesia:  Not Indicated   Additional requests/questions:     Signed, Elliot Cousin

## 2023-07-30 ENCOUNTER — Telehealth: Payer: Self-pay | Admitting: Family Medicine

## 2023-07-30 NOTE — Telephone Encounter (Signed)
Joni Reining from [facility] called stating that the patient is scheduled for a Facet injection tomorrow but his insurance states there is unmet criteria and it is pending higher level review.  She said that this most likely means that it will be denied due to not being 3 months since he had the last one.  She said that we could go ahead and call to do a Peer to Peer with [employer] today in hopes that it would be approved?  [employer] - # [phone]  Joni Reining ([facility]) - # [phone]  Please advise.

## 2023-07-30 NOTE — Telephone Encounter (Signed)
Pt called about this issue. Getting ready to go out of town and hopeful to get the injections soon. Not sure if PTP has to be scheduled or if Dr. Denyse Amass can reach out. Copying Lillia Abed

## 2023-07-31 ENCOUNTER — Ambulatory Visit
Admission: RE | Admit: 2023-07-31 | Discharge: 2023-07-31 | Disposition: A | Discharge status: Home / Self Care | Payer: No Typology Code available for payment source | Source: Ambulatory Visit | Attending: Family Medicine | Admitting: Family Medicine

## 2023-07-31 DIAGNOSIS — M5416 Radiculopathy, lumbar region: Secondary | ICD-10-CM

## 2023-07-31 MED ORDER — METHYLPREDNISOLONE ACETATE 40 MG/ML INJ SUSP (RADIOLOG
80.0000 mg | Freq: Once | INTRAMUSCULAR | Status: AC
  Administered 2023-07-31: 80 mg via EPIDURAL

## 2023-07-31 MED ORDER — IOPAMIDOL (ISOVUE-M 200) INJECTION 41%
1.0000 mL | Freq: Once | INTRAMUSCULAR | Status: AC
  Administered 2023-07-31: 1 mL via EPIDURAL

## 2023-07-31 NOTE — Telephone Encounter (Signed)
Procedure performed today.

## 2023-07-31 NOTE — Discharge Instructions (Signed)

## 2023-08-06 ENCOUNTER — Telehealth: Payer: Self-pay | Admitting: Cardiology

## 2023-08-06 ENCOUNTER — Encounter: Payer: Self-pay | Admitting: Cardiology

## 2023-08-06 NOTE — Telephone Encounter (Signed)
Patient is requesting call back to discuss upcoming procedure. Unsure if he should change procedure date due to dental procedure he has coming up and would like a call back to discuss further. Please advise.

## 2023-08-06 NOTE — Telephone Encounter (Signed)
Attempted to call patient. Unable to leave voicemail.  

## 2023-08-06 NOTE — Telephone Encounter (Signed)
See MyChart encounter

## 2023-08-06 NOTE — Telephone Encounter (Signed)
Error

## 2023-08-07 DIAGNOSIS — G4733 Obstructive sleep apnea (adult) (pediatric): Secondary | ICD-10-CM | POA: Diagnosis not present

## 2023-08-24 ENCOUNTER — Other Ambulatory Visit: Payer: Self-pay

## 2023-08-24 DIAGNOSIS — I480 Paroxysmal atrial fibrillation: Secondary | ICD-10-CM

## 2023-08-24 DIAGNOSIS — I2510 Atherosclerotic heart disease of native coronary artery without angina pectoris: Secondary | ICD-10-CM

## 2023-08-24 MED ORDER — METOPROLOL TARTRATE 25 MG PO TABS
25.0000 mg | ORAL_TABLET | Freq: Two times a day (BID) | ORAL | 1 refills | Status: AC
Start: 2023-08-24 — End: ?

## 2023-09-04 ENCOUNTER — Other Ambulatory Visit: Payer: Medicare PPO

## 2023-09-06 ENCOUNTER — Other Ambulatory Visit (HOSPITAL_COMMUNITY): Payer: Medicare PPO

## 2023-09-07 DIAGNOSIS — G4733 Obstructive sleep apnea (adult) (pediatric): Secondary | ICD-10-CM | POA: Diagnosis not present

## 2023-10-03 NOTE — Progress Notes (Unsigned)
   Rubin Payor, PhD, LAT, ATC acting as a scribe for Clementeen Graham, MD.  Patrick Moyer is a 72 y.o. adult who presents to [facility] at [facility] today for cont'd L hip pain. Pt was last seen by Dr. Denyse Amass on 05/24/23 for L hip pain and lumbar radiculopathy and he was advised to find a PT location in [place] where her would be staying over the summer. Repeat lumbar ESI was performed on 07/31/23.  Today, pt reports ***  Dx imaging: 05/24/23 L hip XR 10/16/22 L-spine XR & L-spine MRI   Pertinent review of systems: ***  Relevant historical information: ***   Exam:  There were no vitals taken for this visit. General: Well Developed, well nourished, and in no acute distress.   MSK: ***    Lab and Radiology Results No results found for this or any previous visit (from the past 72 hour(s)). No results found.     Assessment and Plan: 72 y.o. adult with ***   PDMP not reviewed this encounter. No orders of the defined types were placed in this encounter.  No orders of the defined types were placed in this encounter.    Discussed warning signs or symptoms. Please see discharge instructions. Patient expresses understanding.   ***

## 2023-10-04 ENCOUNTER — Ambulatory Visit: Payer: Medicare PPO | Admitting: Family Medicine

## 2023-10-04 ENCOUNTER — Encounter: Payer: Self-pay | Admitting: Family Medicine

## 2023-10-04 ENCOUNTER — Other Ambulatory Visit: Payer: Self-pay

## 2023-10-04 VITALS — BP 120/82 | HR 42 | Ht 75.0 in | Wt 211.0 lb

## 2023-10-04 DIAGNOSIS — M25552 Pain in left hip: Secondary | ICD-10-CM

## 2023-10-04 DIAGNOSIS — M5416 Radiculopathy, lumbar region: Secondary | ICD-10-CM

## 2023-10-04 DIAGNOSIS — K9089 Other intestinal malabsorption: Secondary | ICD-10-CM | POA: Diagnosis not present

## 2023-10-04 LAB — CBC
HCT: 43.7 % (ref 39.0–52.0)
Hemoglobin: 14.7 g/dL (ref 13.0–17.0)
MCHC: 33.7 g/dL (ref 30.0–36.0)
MCV: 94.4 fL (ref 78.0–100.0)
Platelets: 238 10*3/uL (ref 150.0–400.0)
RBC: 4.63 Mil/uL (ref 4.22–5.81)
RDW: 12.5 % (ref 11.5–15.5)
WBC: 5.3 10*3/uL (ref 4.0–10.5)

## 2023-10-04 LAB — BASIC METABOLIC PANEL
BUN: 33 mg/dL — ABNORMAL HIGH (ref 6–23)
CO2: 28 meq/L (ref 19–32)
Calcium: 10 mg/dL (ref 8.4–10.5)
Chloride: 108 meq/L (ref 96–112)
Creatinine, Ser: 0.79 mg/dL (ref 0.40–1.50)
GFR: 89.55 mL/min (ref >60.00)
Glucose, Bld: 101 mg/dL — ABNORMAL HIGH (ref 70–99)
Potassium: 3.9 meq/L (ref 3.5–5.1)
Sodium: 142 meq/L (ref 135–145)

## 2023-10-04 NOTE — Progress Notes (Signed)
Labs look okay to have contrast.  White blood cell count and CBC look okay.

## 2023-10-04 NOTE — Patient Instructions (Signed)
Thank you for coming in today.   You received an injection today. Seek immediate medical attention if the joint becomes red, extremely painful, or is oozing fluid.  

## 2023-10-05 ENCOUNTER — Ambulatory Visit: Payer: Medicare PPO

## 2023-10-07 DIAGNOSIS — G4733 Obstructive sleep apnea (adult) (pediatric): Secondary | ICD-10-CM | POA: Diagnosis not present

## 2023-10-09 ENCOUNTER — Other Ambulatory Visit: Payer: Self-pay

## 2023-10-09 ENCOUNTER — Ambulatory Visit: Payer: Medicare PPO | Admitting: Family Medicine

## 2023-10-09 ENCOUNTER — Encounter: Payer: Self-pay | Admitting: Family Medicine

## 2023-10-09 VITALS — BP 114/72 | HR 69 | Wt 208.0 lb

## 2023-10-09 DIAGNOSIS — M1612 Unilateral primary osteoarthritis, left hip: Secondary | ICD-10-CM

## 2023-10-09 DIAGNOSIS — M25552 Pain in left hip: Secondary | ICD-10-CM | POA: Diagnosis not present

## 2023-10-09 NOTE — Progress Notes (Signed)
Rubin Payor, PhD, LAT, ATC acting as a scribe for Clementeen Graham, MD.  Patrick Moyer is a 72 y.o. adult who presents to [facility] at [facility] today for cont'd L hip pain. Pt was last seen by Dr. Denyse Amass on 10/04/23 and was given a L GT steroid injection.  Today, pt reports the prior injection only gave him about 10% relief. Pt locates pain to the anterior aspect of his L hip.   Dx imaging: 05/24/23 L hip XR 10/16/22 L-spine XR & L-spine MRI   Pertinent review of systems: No fevers or chills  Relevant historical information: Lumbar spondylosis and ankle pronation deformity.   Exam:  BP 114/72   Pulse 69   Wt 208 lb (94.3 kg)   SpO2 96%   BMI 26.00 kg/m  General: Well Developed, well nourished, and in no acute distress.   MSK: Left hip normal-appearing Nontender.    Lab and Radiology Results  Procedure: Real-time Ultrasound Guided Injection of left hip femoral acetabular joint anterior approach Device: Philips Affiniti 50G/GE Logiq Images permanently stored and available for review in PACS Verbal informed consent obtained.  Discussed risks and benefits of procedure. Warned about infection, bleeding, hyperglycemia damage to structures among others. Patient expresses understanding and agreement Time-out conducted.   Noted no overlying erythema, induration, or other signs of local infection.   Skin prepped in a sterile fashion.   Local anesthesia: Topical Ethyl chloride.   With sterile technique and under real time ultrasound guidance: 40 mg of Kenalog and 2 mg of Marcaine injected into hip joint. Fluid seen entering the joint capsule.   Completed without difficulty   Pain immediately resolved suggesting accurate placement of the medication.   Advised to call if fevers/chills, erythema, induration, drainage, or persistent bleeding.   Images permanently stored and available for review in the ultrasound unit.  Impression: Technically successful  ultrasound guided injection.   EXAM: DG HIP (WITH OR WITHOUT PELVIS) 2-3V LEFT   COMPARISON:  None Available.   FINDINGS: There is no evidence of hip fracture or dislocation. Moderate severity degenerative changes are seen involving both hips, in the form of joint space narrowing, acetabular sclerosis and lateral acetabular bony spurring.   IMPRESSION: Moderate severity degenerative changes without an acute osseous abnormality.     Electronically Signed   By: Patrick Moyer M.D.   On: 05/28/2023 22:52   I, Clementeen Graham, personally (independently) visualized and performed the interpretation of the images attached in this note.      Assessment and Plan: 72 y.o. adult with left hip pain.  Based on immediate pain response to injection today the pain is most likely coming from the hip joint.  If Patrick Moyer gets long-lasting relief from today's injection we could repeat it again in 3 months.  If the injection does not last long enough I would recommend surgery consultation for evaluation total hip replacement.  Dr. Dion Saucier at [facility] has performed a knee replacement for him previously.  That would be a good starting point for referral if needed.   PDMP not reviewed this encounter. Orders Placed This Encounter  Procedures   [place] LIMITED JOINT SPACE STRUCTURES LOW LEFT(NO LINKED CHARGES)    Order Specific Question:   Reason for Exam (SYMPTOM  OR DIAGNOSIS REQUIRED)    Answer:   left hip pain    Order Specific Question:   Preferred imaging location?    Answer:   Adult nurse [facility]   No  orders of the defined types were placed in this encounter.    Discussed warning signs or symptoms. Please see discharge instructions. Patient expresses understanding.   The above documentation has been reviewed and is accurate and complete Clementeen Graham, M.D.

## 2023-10-09 NOTE — Patient Instructions (Addendum)
Thank you for coming in today.    You received an injection today. Seek immediate medical attention if the joint becomes red, extremely painful, or is oozing fluid.   Call or go to the ER if you develop a large red swollen joint with extreme pain or oozing puss.    Let me know how this goes.  If we want to involve a surgeon I can refer you to Dr Dion Saucier or others.

## 2023-10-11 ENCOUNTER — Telehealth (HOSPITAL_COMMUNITY): Payer: Self-pay | Admitting: *Deleted

## 2023-10-11 NOTE — Telephone Encounter (Signed)
Attempted to call patient regarding upcoming cardiac CT appointment. °Left message on voicemail with name and callback number ° °Edie Vallandingham RN Navigator Cardiac Imaging °[facility] °[phone] Office °[phone] Cell ° °

## 2023-10-11 NOTE — Telephone Encounter (Signed)
Reaching out to patient to offer assistance regarding upcoming cardiac imaging study; pt verbalizes understanding of appt date/time, parking situation and where to check in, pre-test NPO status and verified current allergies; name and call back number provided for further questions should they arise  Larey Brick RN Navigator Cardiac Imaging [facility] [phone] office [phone] cell  Patient to take his daily metoprolol tartrate and is aware to arrive at 8:30 AM.

## 2023-10-12 ENCOUNTER — Ambulatory Visit (HOSPITAL_COMMUNITY)
Admission: RE | Admit: 2023-10-12 | Discharge: 2023-10-12 | Disposition: A | Discharge status: Home / Self Care | Payer: Medicare PPO | Source: Ambulatory Visit | Attending: Cardiology | Admitting: Cardiology

## 2023-10-12 DIAGNOSIS — I480 Paroxysmal atrial fibrillation: Secondary | ICD-10-CM | POA: Insufficient documentation

## 2023-10-12 DIAGNOSIS — I2510 Atherosclerotic heart disease of native coronary artery without angina pectoris: Secondary | ICD-10-CM | POA: Diagnosis not present

## 2023-10-12 MED ORDER — IOHEXOL 350 MG/ML SOLN
95.0000 mL | Freq: Once | INTRAVENOUS | Status: AC | PRN
  Administered 2023-10-12: 95 mL via INTRAVENOUS

## 2023-10-17 ENCOUNTER — Telehealth: Payer: Self-pay | Admitting: *Deleted

## 2023-10-17 DIAGNOSIS — M1612 Unilateral primary osteoarthritis, left hip: Secondary | ICD-10-CM | POA: Diagnosis not present

## 2023-10-17 NOTE — Telephone Encounter (Signed)
Pre-operative Risk Assessment    Patient Name: Patrick Moyer  DOB: 10-Mar-1951 MRN: [id number]   DATE OF LAST VISIT: 02/14/23 DR. CAMERON LAMBERT DATE OF NEXT VISIT: 01/25/24 DR. CAMERON LAMBERT    Request for Surgical Clearance    Procedure:   LEFT TOTAL HIP ARTHROPLASTY  Date of Surgery:  Clearance TBD                                 Surgeon:  DR. Teryl Lucy  Surgeon's Group or Practice Name:  [facility] Phone number:  [phone] EXT [address] Fax number:  [phone]   Type of Clearance Requested:   - Medical  - Pharmacy:  Hold Apixaban (Eliquis)     Type of Anesthesia:  Spinal   Additional requests/questions:    Elpidio Anis   10/17/2023, 1:18 PM

## 2023-10-17 NOTE — Telephone Encounter (Signed)
Preoperative team, patient has planned A-fib ablation scheduled for 10/19/2023.  This means that his hip replacement will need to be postponed until after 01/22/2024.  Please contact requesting office and let them know that they will have to resubmit preoperative cardiac evaluation form closer to the time of surgery.  Thank you for your help.  Thomasene Ripple. Danne Vasek NP-C     10/17/2023, 2:52 PM [facility] [address] Office [phone] Fax [phone]

## 2023-10-17 NOTE — Telephone Encounter (Signed)
Patient with diagnosis of afib on Eliquis for anticoagulation.    Procedure: left total hip arthroplasty Date of procedure: TBD  CHA2DS2-VASc Score = 2  This indicates a 2.2% annual risk of stroke. The patient's score is based upon: CHF History: 0 HTN History: 0 Diabetes History: 0 Stroke History: 0 Vascular Disease History: 1 Age Score: 1 Gender Score: 0   CrCl >176mL/min Platelet count 238K  Pt is pending afib ablation 10/19/23 and will not be able to interrupt anticoagulation for 3 months after. Hip replacement will need to be no earlier than 01/22/24 to allow for 3 day Eliquis washout prior. At that time, he can hold Eliquis for 3 days prior to procedure.  **This guidance is not considered finalized until pre-operative APP has relayed final recommendations.**

## 2023-10-17 NOTE — Telephone Encounter (Signed)
I will update the requesting office to see notes from pre op APP Edd Fabian, FNP.

## 2023-10-18 ENCOUNTER — Telehealth: Payer: Self-pay

## 2023-10-18 NOTE — Pre-Procedure Instructions (Signed)
Attempted to call patient regarding procedure instructions.  Left voicemail on the following items: Arrival time 1100 Nothing to eat or drink after midnight No meds AM of procedure Responsible person to drive you home and stay with you for 24 hrs  Have you missed any doses of anti-coagulant Eliquis- should be taken twice a day, let us know if you have missed any doses.

## 2023-10-18 NOTE — Telephone Encounter (Signed)
[employer] Watchman [employer] report for 10/12/2023 cCT: Orson Aloe: Broccoli morphology with fairly circular ostium. Max 26/ AVG 25/ Depth 17.2 Likely use a 31mm device and double curve sheath. Mid/Mid TSP RAO 23 CAU 23

## 2023-10-19 ENCOUNTER — Other Ambulatory Visit: Payer: Self-pay

## 2023-10-19 ENCOUNTER — Ambulatory Visit (HOSPITAL_BASED_OUTPATIENT_CLINIC_OR_DEPARTMENT_OTHER): Payer: Medicare PPO | Admitting: Anesthesiology

## 2023-10-19 ENCOUNTER — Ambulatory Visit (HOSPITAL_COMMUNITY): Payer: Medicare PPO | Admitting: Anesthesiology

## 2023-10-19 ENCOUNTER — Ambulatory Visit (HOSPITAL_COMMUNITY)
Admission: RE | Admit: 2023-10-19 | Discharge: 2023-10-19 | Disposition: A | Discharge status: Home / Self Care | Payer: Medicare PPO | Attending: Cardiology | Admitting: Cardiology

## 2023-10-19 ENCOUNTER — Other Ambulatory Visit (HOSPITAL_COMMUNITY): Payer: Self-pay

## 2023-10-19 ENCOUNTER — Encounter (HOSPITAL_COMMUNITY)
Admission: RE | Disposition: A | Discharge status: Home / Self Care | Payer: Self-pay | Source: Home / Self Care | Attending: Cardiology

## 2023-10-19 DIAGNOSIS — E785 Hyperlipidemia, unspecified: Secondary | ICD-10-CM | POA: Insufficient documentation

## 2023-10-19 DIAGNOSIS — I7121 Aneurysm of the ascending aorta, without rupture: Secondary | ICD-10-CM | POA: Diagnosis not present

## 2023-10-19 DIAGNOSIS — I480 Paroxysmal atrial fibrillation: Secondary | ICD-10-CM | POA: Insufficient documentation

## 2023-10-19 DIAGNOSIS — I4891 Unspecified atrial fibrillation: Secondary | ICD-10-CM | POA: Diagnosis not present

## 2023-10-19 DIAGNOSIS — I2510 Atherosclerotic heart disease of native coronary artery without angina pectoris: Secondary | ICD-10-CM | POA: Insufficient documentation

## 2023-10-19 DIAGNOSIS — Z7901 Long term (current) use of anticoagulants: Secondary | ICD-10-CM | POA: Diagnosis not present

## 2023-10-19 DIAGNOSIS — I10 Essential (primary) hypertension: Secondary | ICD-10-CM | POA: Diagnosis not present

## 2023-10-19 DIAGNOSIS — Q8740 Marfan's syndrome, unspecified: Secondary | ICD-10-CM | POA: Insufficient documentation

## 2023-10-19 DIAGNOSIS — Z7982 Long term (current) use of aspirin: Secondary | ICD-10-CM | POA: Insufficient documentation

## 2023-10-19 DIAGNOSIS — Z79899 Other long term (current) drug therapy: Secondary | ICD-10-CM | POA: Insufficient documentation

## 2023-10-19 DIAGNOSIS — G4733 Obstructive sleep apnea (adult) (pediatric): Secondary | ICD-10-CM | POA: Diagnosis not present

## 2023-10-19 HISTORY — PX: ATRIAL FIBRILLATION ABLATION: EP1191

## 2023-10-19 LAB — POCT ACTIVATED CLOTTING TIME: Activated Clotting Time: 305 s

## 2023-10-19 SURGERY — ATRIAL FIBRILLATION ABLATION
Anesthesia: General

## 2023-10-19 MED ORDER — APIXABAN 5 MG PO TABS
5.0000 mg | ORAL_TABLET | Freq: Two times a day (BID) | ORAL | Status: DC
  Administered 2023-10-19: 5 mg via ORAL
  Filled 2023-10-19: qty 1

## 2023-10-19 MED ORDER — PROTAMINE SULFATE 10 MG/ML IV SOLN
INTRAVENOUS | Status: DC | PRN
Start: 2023-10-19 — End: 2023-10-19
  Administered 2023-10-19: 35 mg via INTRAVENOUS

## 2023-10-19 MED ORDER — SODIUM CHLORIDE 0.9 % IV SOLN: 250.0000 mL | INTRAVENOUS | Status: DC | PRN

## 2023-10-19 MED ORDER — HEPARIN (PORCINE) IN NACL 1000-0.9 UT/500ML-% IV SOLN
INTRAVENOUS | Status: DC | PRN
  Administered 2023-10-19 (×3): 500 mL

## 2023-10-19 MED ORDER — FENTANYL CITRATE (PF) 100 MCG/2ML IJ SOLN
INTRAMUSCULAR | Status: DC | PRN
  Administered 2023-10-19: 100 ug via INTRAVENOUS

## 2023-10-19 MED ORDER — ACETAMINOPHEN 325 MG PO TABS
650.0000 mg | ORAL_TABLET | ORAL | Status: DC | PRN
  Administered 2023-10-19: 650 mg via ORAL

## 2023-10-19 MED ORDER — PANTOPRAZOLE SODIUM 40 MG PO TBEC
40.0000 mg | DELAYED_RELEASE_TABLET | Freq: Every day | ORAL | 0 refills | Status: DC
  Filled 2023-10-19: qty 45, 45d supply, fill #0

## 2023-10-19 MED ORDER — PROPOFOL 10 MG/ML IV BOLUS
INTRAVENOUS | Status: DC | PRN
  Administered 2023-10-19: 170 mg via INTRAVENOUS

## 2023-10-19 MED ORDER — OMEPRAZOLE 20 MG PO CPDR: 20.0000 mg | DELAYED_RELEASE_CAPSULE | ORAL | Status: DC

## 2023-10-19 MED ORDER — ACETAMINOPHEN 325 MG PO TABS
ORAL_TABLET | ORAL | Status: AC
  Filled 2023-10-19: qty 2

## 2023-10-19 MED ORDER — SODIUM CHLORIDE 0.9 % IV SOLN: INTRAVENOUS | Status: DC

## 2023-10-19 MED ORDER — SODIUM CHLORIDE 0.9% FLUSH: 3.0000 mL | Freq: Two times a day (BID) | INTRAVENOUS | Status: DC

## 2023-10-19 MED ORDER — ATROPINE SULFATE 0.4 MG/ML IV SOLN
INTRAVENOUS | Status: DC | PRN
Start: 2023-10-19 — End: 2023-10-19
  Administered 2023-10-19: 1 mg via INTRAVENOUS

## 2023-10-19 MED ORDER — ONDANSETRON HCL 4 MG/2ML IJ SOLN: 4.0000 mg | Freq: Four times a day (QID) | INTRAMUSCULAR | Status: DC | PRN

## 2023-10-19 MED ORDER — SODIUM CHLORIDE 0.9% FLUSH: 3.0000 mL | INTRAVENOUS | Status: DC | PRN

## 2023-10-19 MED ORDER — COLCHICINE 0.6 MG PO TABS
0.6000 mg | ORAL_TABLET | Freq: Two times a day (BID) | ORAL | Status: DC
  Administered 2023-10-19: 0.6 mg via ORAL
  Filled 2023-10-19: qty 1

## 2023-10-19 MED ORDER — PANTOPRAZOLE SODIUM 40 MG PO TBEC
40.0000 mg | DELAYED_RELEASE_TABLET | Freq: Every day | ORAL | Status: DC
  Administered 2023-10-19: 40 mg via ORAL
  Filled 2023-10-19: qty 1

## 2023-10-19 MED ORDER — HEPARIN SODIUM (PORCINE) 1000 UNIT/ML IJ SOLN
INTRAMUSCULAR | Status: AC
  Filled 2023-10-19: qty 20

## 2023-10-19 MED ORDER — FENTANYL CITRATE (PF) 100 MCG/2ML IJ SOLN
INTRAMUSCULAR | Status: AC
  Filled 2023-10-19: qty 2

## 2023-10-19 MED ORDER — ONDANSETRON HCL 4 MG/2ML IJ SOLN
INTRAMUSCULAR | Status: DC | PRN
  Administered 2023-10-19: 4 mg via INTRAVENOUS

## 2023-10-19 MED ORDER — ROCURONIUM BROMIDE 100 MG/10ML IV SOLN
INTRAVENOUS | Status: DC | PRN
  Administered 2023-10-19: 60 mg via INTRAVENOUS
  Administered 2023-10-19: 40 mg via INTRAVENOUS

## 2023-10-19 MED ORDER — LIDOCAINE HCL (CARDIAC) PF 100 MG/5ML IV SOSY
PREFILLED_SYRINGE | INTRAVENOUS | Status: DC | PRN
  Administered 2023-10-19: 60 mg via INTRAVENOUS

## 2023-10-19 MED ORDER — DEXAMETHASONE SODIUM PHOSPHATE 10 MG/ML IJ SOLN
INTRAMUSCULAR | Status: DC | PRN
  Administered 2023-10-19: 5 mg via INTRAVENOUS

## 2023-10-19 MED ORDER — PHENYLEPHRINE HCL-NACL 20-0.9 MG/250ML-% IV SOLN
INTRAVENOUS | Status: DC | PRN
  Administered 2023-10-19: 50 ug/min via INTRAVENOUS

## 2023-10-19 MED ORDER — HEPARIN SODIUM (PORCINE) 1000 UNIT/ML IJ SOLN
INTRAMUSCULAR | Status: DC | PRN
  Administered 2023-10-19: 4000 [IU] via INTRAVENOUS
  Administered 2023-10-19: 14000 [IU] via INTRAVENOUS

## 2023-10-19 MED ORDER — SUGAMMADEX SODIUM 200 MG/2ML IV SOLN
INTRAVENOUS | Status: DC | PRN
  Administered 2023-10-19: 200 mg via INTRAVENOUS

## 2023-10-19 MED ORDER — COLCHICINE 0.6 MG PO TABS
0.6000 mg | ORAL_TABLET | Freq: Two times a day (BID) | ORAL | 0 refills | Status: DC
  Filled 2023-10-19: qty 10, 5d supply, fill #0

## 2023-10-19 SURGICAL SUPPLY — 22 items
BAG SNAP BAND KOVER 36X36 (MISCELLANEOUS) IMPLANT
BLANKET WARM UNDERBOD FULL ACC (MISCELLANEOUS) ×1 IMPLANT
CABLE PFA RX CATH CONN (CABLE) IMPLANT
CATH FARAWAVE ABLATION 31 (CATHETERS) IMPLANT
CATH MAPPNG PENTARAY F 2-6-2MM (CATHETERS) IMPLANT
CATH SOUNDSTAR ECO 8FR (CATHETERS) IMPLANT
CATH WEB BI DIR CSDF CRV REPRO (CATHETERS) IMPLANT
CLOSURE PERCLOSE PROSTYLE (VASCULAR PRODUCTS) IMPLANT
COVER SWIFTLINK CONNECTOR (BAG) ×1 IMPLANT
DILATOR VESSEL 38 20CM 16FR (INTRODUCER) IMPLANT
GUIDEWIRE INQWIRE 1.5J.035X260 (WIRE) IMPLANT
INQWIRE 1.5J .035X260CM (WIRE) ×1
PACK EP LATEX FREE (CUSTOM PROCEDURE TRAY) ×1
PACK EP LF (CUSTOM PROCEDURE TRAY) ×1 IMPLANT
PAD DEFIB RADIO PHYSIO CONN (PAD) ×1 IMPLANT
PATCH CARTO3 (PAD) IMPLANT
PENTARAY F 2-6-2MM (CATHETERS) ×1
SHEATH FARADRIVE STEERABLE (SHEATH) IMPLANT
SHEATH PINNACLE 8F 10CM (SHEATH) IMPLANT
SHEATH PINNACLE 9F 10CM (SHEATH) IMPLANT
SHEATH PROBE COVER 6X72 (BAG) IMPLANT
SHEATH WIRE KIT BAYLIS SL1 (KITS) IMPLANT

## 2023-10-19 NOTE — Anesthesia Procedure Notes (Signed)
Procedure Name: Intubation Date/Time: 10/19/2023 1:34 PM  Performed by: Alwyn Ren, CRNAPre-anesthesia Checklist: Patient identified, Emergency Drugs available, Suction available and Patient being monitored Patient Re-evaluated:Patient Re-evaluated prior to induction Oxygen Delivery Method: Circle system utilized Preoxygenation: Pre-oxygenation with 100% oxygen Induction Type: IV induction Ventilation: Mask ventilation without difficulty Laryngoscope Size: Miller and 2 Grade View: Grade I Tube type: Oral Tube size: 7.0 mm Number of attempts: 1 Airway Equipment and Method: Stylet and Oral airway Placement Confirmation: ETT inserted through vocal cords under direct vision, positive ETCO2 and breath sounds checked- equal and bilateral Secured at: 22 cm Tube secured with: Tape Dental Injury: Teeth and Oropharynx as per pre-operative assessment

## 2023-10-19 NOTE — Transfer of Care (Signed)
Immediate Anesthesia Transfer of Care Note  Patient: Patrick Moyer  Procedure(s) Performed: ATRIAL FIBRILLATION ABLATION  Patient Location: PACU and Cath Lab  Anesthesia Type:General  Level of Consciousness: awake, alert , and oriented  Airway & Oxygen Therapy: Patient Spontanous Breathing  Post-op Assessment: Report given to RN and Post -op Vital signs reviewed and stable  Post vital signs: Reviewed and stable  Last Vitals:  Vitals Value Taken Time  BP    Temp    Pulse    Resp    SpO2      Last Pain:  Vitals:   10/19/23 1201  TempSrc: Oral  PainSc:          Complications: There were no known notable events for this encounter.

## 2023-10-19 NOTE — H&P (Signed)
Electrophysiology Office Note:     Date:  10/19/2023    ID:  Patrick Moyer, DOB [birth year], MRN [id number]   PCP:  Daisy Floro, MD      [facility] Cardiologist:  Meriam Sprague, MD  [facility] Electrophysiologist:  Lanier Prude, MD    Referring MD: Meriam Sprague, MD    Chief Complaint: Atrial fibrillation   History of Present Illness:     Patrick Moyer is a 72 y.o. adult who presents for an evaluation of atrial fibrillation at the request of Dr. Shari Prows.    Their medical history includes ascending aortic aneurysm, nonobstructive coronary artery disease, marfanoid habitus, hyperlipidemia.  The patient has worn a heart monitor in November 2023 showing a 7% burden of atrial fibrillation.  He is on Eliquis for stroke prophylaxis.  He is very active and is hesitant to continue anticoagulation given the risks of bleeding.  At that appointment with Dr. Shari Prows he reported only taking aspirin consistently.   Today, he is accompanied by his wife. He complains of experiencing rapid heart beats during his a fib episodes. He reports that he 2 episodes lasting 45 minutes. His watch revealed burden of A fib values such as 7%, 15%, or 2% . He reports 4 spells a week and adds that triggers may include strong emotions. He does drink wine in moderation.    He only takes half of Metoprolol pill at night as he reports that he is unable to take the entire dose. He confirms that he takes Celebrex 200 MG daily. We discussed the catheter ablation procedure and watchman device implantation at great length.   He denies any chest pain, shortness of breath, or peripheral edema. No lightheadedness, headaches, syncope, orthopnea, or PND.   Their past medical, social and family history was reveiwed.  Plan for PVI. Procedure reviewed.   Current Medications: Active Medications      Current Meds  Medication Sig   acetaminophen (TYLENOL) 500 MG tablet Take 500 mg by mouth at  bedtime.   apixaban (ELIQUIS) 5 MG TABS tablet TAKE 1 TABLET BY MOUTH TWICE A DAY   Ascorbic Acid (VITAMIN C) 1000 MG tablet Take 1,000 mg by mouth daily.   B Complex Vitamins (B COMPLEX 100 PO) Take 1 tablet by mouth in the morning and at bedtime.   Ca Carbonate-Mag Hydroxide (ROLAIDS PO) Take by mouth as needed.   celecoxib (CELEBREX) 200 MG capsule Take 200 mg by mouth 2 (two) times daily.   cholecalciferol (VITAMIN D) 1000 UNITS tablet Take 1,000 Units by mouth in the morning and at bedtime.   diclofenac Sodium (VOLTAREN) 1 % GEL Apply 1 Application topically 3 (three) times daily as needed (pain).   fexofenadine (ALLEGRA) 180 MG tablet Take 180 mg by mouth daily.   FIBER PO Take 1 each by mouth daily.   fluticasone (FLONASE) 50 MCG/ACT nasal spray Place 1 spray into both nostrils in the morning and at bedtime.   folic acid (FOLVITE) 1 MG tablet Take 1 mg by mouth daily.   glucosamine-chondroitin 500-400 MG tablet Take 1 tablet by mouth daily.   guaiFENesin (MUCINEX) 600 MG 12 hr tablet Take 1,200 mg by mouth 2 (two) times daily.   L-Lysine 500 MG CAPS Take 1 capsule by mouth in the morning and at bedtime.   Lutein-Zeaxanthin 25-5 MG CAPS Take 1 tablet by mouth daily.   methotrexate (RHEUMATREX) 2.5 MG tablet Take 10 mg by mouth daily.   metoprolol  tartrate (LOPRESSOR) 25 MG tablet Take 1 tablet (25 mg total) by mouth 2 (two) times daily.   montelukast (SINGULAIR) 10 MG tablet Take 10 mg by mouth at bedtime as needed.   Omega-3 Fatty Acids (FISH OIL) 1000 MG CAPS Take 1 capsule (1,000 mg total) by mouth in the morning and at bedtime.   omeprazole (PRILOSEC) 20 MG capsule Take 20 mg by mouth daily.   Potassium Gluconate 550 (90 K) MG TABS Take 1 tablet by mouth as directed. 5 tablets weekly.   rosuvastatin (CRESTOR) 5 MG tablet Take 1 tablet (5 mg total) by mouth 3 (three) times a week.   SODIUM FLUORIDE 5000 PPM 1.1 % GEL dental gel Take 1 application by mouth daily.   Turmeric 500 MG  CAPS Take 500 mg by mouth daily.   valACYclovir (VALTREX) 1000 MG tablet Take 1,000 mg by mouth daily as needed (cold sore).        Allergies:   Other, Cialis [tadalafil], Crestor [rosuvastatin], Soy protein [soybean oil], and Viagra [sildenafil]    ROS:   Please see the history of present illness.    (+)palpitations All other systems reviewed and are negative.   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 Paroxysmal atrial fibrillation (HCC)   2. Coronary artery disease involving native coronary artery of native heart without angina pectoris     PLAN:     In order of problems listed above:   #Paroxysmal atrial fibrillation Symptomatic 7% burden on recent monitor Discussed treatment strategies for atrial fibrillation during today's visit including continued conservative management, antiarrhythmic drug therapy and catheter ablation.   Discussed treatment options today for AF including antiarrhythmic drug therapy and ablation. Discussed risks, recovery and likelihood of success with each treatment strategy. Risk, benefits, and alternatives to EP study and ablation for afib were discussed. These risks include but are not limited to stroke, bleeding,  vascular damage, tamponade, perforation, damage to the esophagus, lungs, phrenic nerve and other structures, pulmonary vein stenosis, worsening renal function, coronary vasospasm and death.  Discussed potential need for repeat ablation procedures and antiarrhythmic drugs after an initial ablation. The patient understands these risk and wishes to proceed.  We will therefore proceed with catheter ablation at the next available time.  Carto, ICE, anesthesia are requested for the procedure.  Will also obtain CT PV protocol prior to the procedure to exclude LAA thrombus and further evaluate atrial anatomy.  Presents for PVI today. Procedure reviewed.     Signed, Rossie Muskrat.  Lalla Brothers, MD, [facility], [facility] 10/19/2023 Electrophysiology [facility]

## 2023-10-19 NOTE — Discharge Instructions (Signed)

## 2023-10-19 NOTE — Anesthesia Preprocedure Evaluation (Signed)
Anesthesia Evaluation  Patient identified by MRN, date of birth, ID band Patient awake    Reviewed: Allergy & Precautions, NPO status , Patient's Chart, lab work & pertinent test results  Airway Mallampati: II  TM Distance: >3 FB Neck ROM: Full    Dental no notable dental hx.    Pulmonary sleep apnea and Continuous Positive Airway Pressure Ventilation    Pulmonary exam normal        Cardiovascular hypertension, Pt. on medications and Pt. on home beta blockers + CAD   Rhythm:Irregular Rate:Normal    1. Left ventricular ejection fraction, by estimation, is 60 to 65%. The left ventricle has normal function. The left ventricle has no regional wall motion abnormalities. There is mild concentric left ventricular hypertrophy. Left ventricular diastolic parameters are consistent with Grade I diastolic dysfunction (impaired relaxation). Elevated left atrial pressure. The average left ventricular global longitudinal strain is -22.6 %. The global longitudinal strain is normal.  2. Right ventricular systolic function is normal. The right ventricular size is normal. There is normal pulmonary artery systolic pressure.  3. Left atrial size was mildly dilated.  4. The mitral valve is normal in structure. Mild to moderate mitral valve regurgitation.  5. The aortic valve is tricuspid. Aortic valve regurgitation is not visualized. No aortic stenosis is present.  6. Aortic dilatation noted. There is moderate dilatation of the aortic root, measuring 45 mm. There is mild dilatation of the ascending aorta, measuring 39 mm.  7. The inferior vena cava is normal in size with greater than 50% respiratory variability, suggesting right atrial pressure of 3 mmHg.   Comparison(s): No significant change from prior study. Prior images reviewed side by side.    Neuro/Psych negative neurological ROS  negative psych ROS   GI/Hepatic Neg liver ROS,GERD  Medicated,,   Endo/Other  negative endocrine ROS    Renal/GU negative Renal ROS  negative genitourinary   Musculoskeletal  (+) Arthritis , Osteoarthritis,    Abdominal Normal abdominal exam  (+)   Peds  Hematology Lab Results      Component                Value               Date                      WBC                      5.3                 10/04/2023                HGB                      14.7                10/04/2023                HCT                      43.7                10/04/2023                MCV                      94.4  10/04/2023                PLT                      238.0               10/04/2023             Lab Results      Component                Value               Date                      NA                       142                 10/04/2023                K                        3.9                 10/04/2023                CO2                      28                  10/04/2023                GLUCOSE                  101 (H)             10/04/2023                BUN                      33 (H)              10/04/2023                CREATININE               0.79                10/04/2023                CALCIUM                  10.0                10/04/2023                GFR                      89.55               10/04/2023                EGFR                     100                 11/22/2022  GFRNONAA                 97                  12/27/2020              Anesthesia Other Findings   Reproductive/Obstetrics                             Anesthesia Physical Anesthesia Plan  ASA: 3  Anesthesia Plan: General   Post-op Pain Management:    Induction: Intravenous  PONV Risk Score and Plan: 2 and Ondansetron, Dexamethasone and Treatment may vary due to age or medical condition  Airway Management Planned: Mask and Oral ETT  Additional Equipment: None  Intra-op Plan:   Post-operative Plan:  Extubation in OR  Informed Consent: I have reviewed the patients History and Physical, chart, labs and discussed the procedure including the risks, benefits and alternatives for the proposed anesthesia with the patient or authorized representative who has indicated his/her understanding and acceptance.     Dental advisory given  Plan Discussed with: CRNA  Anesthesia Plan Comments:        Anesthesia Quick Evaluation

## 2023-10-22 ENCOUNTER — Encounter (HOSPITAL_COMMUNITY): Payer: Self-pay | Admitting: Cardiology

## 2023-10-22 NOTE — Anesthesia Postprocedure Evaluation (Signed)
Anesthesia Post Note  Patient: Patrick Moyer  Procedure(s) Performed: ATRIAL FIBRILLATION ABLATION     Patient location during evaluation: PACU Anesthesia Type: General Level of consciousness: awake and alert Pain management: pain level controlled Vital Signs Assessment: post-procedure vital signs reviewed and stable Respiratory status: spontaneous breathing, nonlabored ventilation, respiratory function stable and patient connected to nasal cannula oxygen Cardiovascular status: blood pressure returned to baseline and stable Postop Assessment: no apparent nausea or vomiting Anesthetic complications: no   There were no known notable events for this encounter.  Last Vitals:  Vitals:   10/19/23 1845 10/19/23 1900  BP: 112/66 118/72  Pulse: 68 73  Resp: 18 18  Temp:    SpO2: 95% 96%    Last Pain:  Vitals:   10/19/23 1645  TempSrc:   PainSc: 1                  Alleya Demeter P Terral Cooks

## 2023-10-24 ENCOUNTER — Telehealth: Payer: Self-pay

## 2023-10-24 ENCOUNTER — Other Ambulatory Visit: Payer: Self-pay

## 2023-10-24 DIAGNOSIS — I480 Paroxysmal atrial fibrillation: Secondary | ICD-10-CM

## 2023-10-24 DIAGNOSIS — I7121 Aneurysm of the ascending aorta, without rupture: Secondary | ICD-10-CM

## 2023-10-24 NOTE — Telephone Encounter (Signed)
The patient agrees to proceed with LAAO on 01/17/2024. Pre-procedure visit scheduled 01/04/2024. He is scheduled for annual CTA to assess aortic dilatation 01/20/2024. Since pre-ablation/Watchman CT showed stability, will postpone CTA to March 2025 to be done at same time as post-Watchman CT. Since it will be past the 1 year mark, will reorder and link CTA. The patient was grateful for call and agreed with plan.

## 2023-10-25 DIAGNOSIS — M0609 Rheumatoid arthritis without rheumatoid factor, multiple sites: Secondary | ICD-10-CM | POA: Diagnosis not present

## 2023-10-25 DIAGNOSIS — Z79899 Other long term (current) drug therapy: Secondary | ICD-10-CM | POA: Diagnosis not present

## 2023-10-25 DIAGNOSIS — E785 Hyperlipidemia, unspecified: Secondary | ICD-10-CM | POA: Diagnosis not present

## 2023-10-25 DIAGNOSIS — M1990 Unspecified osteoarthritis, unspecified site: Secondary | ICD-10-CM | POA: Diagnosis not present

## 2023-10-25 DIAGNOSIS — M25511 Pain in right shoulder: Secondary | ICD-10-CM | POA: Diagnosis not present

## 2023-10-31 DIAGNOSIS — G4733 Obstructive sleep apnea (adult) (pediatric): Secondary | ICD-10-CM | POA: Diagnosis not present

## 2023-11-15 DIAGNOSIS — R109 Unspecified abdominal pain: Secondary | ICD-10-CM | POA: Diagnosis not present

## 2023-11-15 DIAGNOSIS — J329 Chronic sinusitis, unspecified: Secondary | ICD-10-CM | POA: Diagnosis not present

## 2023-11-15 DIAGNOSIS — R058 Other specified cough: Secondary | ICD-10-CM | POA: Diagnosis not present

## 2023-11-15 DIAGNOSIS — J40 Bronchitis, not specified as acute or chronic: Secondary | ICD-10-CM | POA: Diagnosis not present

## 2023-11-15 DIAGNOSIS — R197 Diarrhea, unspecified: Secondary | ICD-10-CM | POA: Diagnosis not present

## 2023-11-16 ENCOUNTER — Ambulatory Visit (HOSPITAL_COMMUNITY)
Admission: RE | Admit: 2023-11-16 | Discharge: 2023-11-16 | Disposition: A | Discharge status: Home / Self Care | Payer: Medicare PPO | Source: Ambulatory Visit | Attending: Internal Medicine | Admitting: Internal Medicine

## 2023-11-16 VITALS — BP 132/82 | HR 60 | Ht 75.0 in | Wt 204.8 lb

## 2023-11-16 DIAGNOSIS — I2510 Atherosclerotic heart disease of native coronary artery without angina pectoris: Secondary | ICD-10-CM | POA: Diagnosis not present

## 2023-11-16 DIAGNOSIS — Z7901 Long term (current) use of anticoagulants: Secondary | ICD-10-CM | POA: Insufficient documentation

## 2023-11-16 DIAGNOSIS — D6869 Other thrombophilia: Secondary | ICD-10-CM | POA: Diagnosis not present

## 2023-11-16 DIAGNOSIS — Z79899 Other long term (current) drug therapy: Secondary | ICD-10-CM | POA: Insufficient documentation

## 2023-11-16 DIAGNOSIS — I480 Paroxysmal atrial fibrillation: Secondary | ICD-10-CM | POA: Diagnosis not present

## 2023-11-16 DIAGNOSIS — E785 Hyperlipidemia, unspecified: Secondary | ICD-10-CM | POA: Insufficient documentation

## 2023-11-16 DIAGNOSIS — I4891 Unspecified atrial fibrillation: Secondary | ICD-10-CM | POA: Diagnosis not present

## 2023-11-16 NOTE — Progress Notes (Signed)
Primary Care Physician: Daisy Floro, MD Primary Cardiologist: Meriam Sprague, MD (Inactive) Electrophysiologist: Lanier Prude, MD     Referring Physician: Dr. Nicolette Bang is a 72 y.o. adult with a history of ascending aortic aneurysm, nonobstructive CAD, Marfanoid habitus, HLD, and paroxysmal atrial fibrillation who presents for consultation in the [facility]. S/p Afib ablation on 10/19/23 by Dr. Lalla Brothers. He is scheduled for Watchman procedure in January 2025. Patient is on Eliquis 5 mg BID for a CHADS2VASC score of 2.  On evaluation today, he is currently in Afib. No episodes of Afib since ablation. No chest pain, SOB, or trouble swallowing. Leg sites healed without issue. No missed doses of anticoagulant.  Today, he denies symptoms of orthopnea, PND, lower extremity edema, dizziness, presyncope, syncope, snoring, daytime somnolence, bleeding, or neurologic sequela. The patient is tolerating medications without difficulties and is otherwise without complaint today.    he has a BMI of Body mass index is 25.6 kg/m.Marland Kitchen Filed Weights   11/16/23 1032  Weight: 92.9 kg    Current Outpatient Medications  Medication Sig Dispense Refill   acetaminophen (TYLENOL) 500 MG tablet Take 500-1,000 mg by mouth at bedtime.     amoxicillin-clavulanate (AUGMENTIN) 875-125 MG tablet Take 1 tablet by mouth 2 (two) times daily.     apixaban (ELIQUIS) 5 MG TABS tablet Take 1 tablet (5 mg total) by mouth 2 (two) times daily. 180 tablet 1   ascorbic acid (VITAMIN C) 500 MG tablet Take 500 mg by mouth 2 (two) times daily.     B Complex Vitamins (B COMPLEX 100 PO) Take 1 tablet by mouth in the morning and at bedtime.     Ca Carbonate-Mag Hydroxide (ROLAIDS PO) Take 1 tablet by mouth 2 (two) times daily.     celecoxib (CELEBREX) 200 MG capsule Take 200 mg by mouth 2 (two) times daily.     cholecalciferol (VITAMIN D) 1000 UNITS tablet Take 1,000 Units  by mouth daily.     Coenzyme Q10 (COQ-10) 200 MG CAPS Take 200 mg by mouth every Monday, Wednesday, and Friday.     COLLAGEN PO Take 3 tablets by mouth 2 (two) times daily.     diclofenac Sodium (VOLTAREN) 1 % GEL Apply 1 Application topically 2 (two) times daily as needed (pain).     dicyclomine (BENTYL) 10 MG capsule Take 10 mg by mouth as needed.     ELDERBERRY PO Take 1 capsule by mouth daily.     Emollient (BIO-OIL EX) Apply 1 Application topically 2 (two) times daily. Apply to left knee     fexofenadine (ALLEGRA) 180 MG tablet Take 180 mg by mouth daily.     FIBER PO Take 2 tablets by mouth daily.     fluticasone (FLONASE) 50 MCG/ACT nasal spray Place 3-4 sprays into both nostrils in the morning and at bedtime.     folic acid (FOLVITE) 1 MG tablet Take 1 mg by mouth daily.     Glucosamine HCl (GLUCOSAMINE PO) Take 1 tablet by mouth at bedtime.     Guaifenesin (MUCINEX MAXIMUM STRENGTH) 1200 MG TB12 Take 1,200 mg by mouth daily.     Hypromellose (GENTEAL OP) Apply 1 Application to eye at bedtime.     L-Lysine 500 MG CAPS Take 500-1,000 mg by mouth See admin instructions. Take 1000 mg in the morning and 500 mg at night     Lutein-Zeaxanthin 25-5 MG CAPS Take 1  tablet by mouth daily. 90 capsule 0   Magnesium 400 MG TABS Take 400 mg by mouth at bedtime.     methotrexate (RHEUMATREX) 2.5 MG tablet Take 12.5 mg by mouth once a week. Caution:Chemotherapy. Protect from light.     metoprolol tartrate (LOPRESSOR) 25 MG tablet Take 1 tablet (25 mg total) by mouth 2 (two) times daily. (Patient taking differently: Take 12.5-25 mg by mouth See admin instructions. Take 12.5 twice daily, may take a 25 mg dose as needed for palpitations) 180 tablet 1   montelukast (SINGULAIR) 10 MG tablet Take 10 mg by mouth every evening.     Omega-3 Fatty Acids (FISH OIL) 1000 MG CAPS Take 1,000 mg by mouth every evening.     pantoprazole (PROTONIX) 40 MG tablet Take 1 tablet (40 mg total) by mouth daily. 45 tablet 0    RESTASIS 0.05 % ophthalmic emulsion Place 1 drop into both eyes 2 (two) times daily.     rosuvastatin (CRESTOR) 5 MG tablet Take 1 tablet (5 mg total) by mouth 3 (three) times a week. (Patient taking differently: Take 5 mg by mouth every Monday, Wednesday, and Friday.) 45 tablet 3   SODIUM FLUORIDE 5000 PPM 1.1 % GEL dental gel Take 1 application  by mouth 2 (two) times daily.     TART CHERRY PO Take 3,000 mg by mouth every evening.     terbinafine (LAMISIL) 250 MG tablet Take 250 mg by mouth See admin instructions. Take 250 mg daily for 7 days every month (start on the 14th through the 20th of every month)     tirzepatide (MOUNJARO) 2.5 MG/0.5ML Pen Inject 2.5 mg into the skin 8 days.     Turmeric 500 MG CAPS Take 500 mg by mouth 2 (two) times daily.     valACYclovir (VALTREX) 1000 MG tablet Take 2,000 mg by mouth 2 (two) times daily as needed (cold sore).     Xylitol (XYLIMELTS MT) Use as directed 1 tablet in the mouth or throat at bedtime.     omeprazole (PRILOSEC) 20 MG capsule Take 1 capsule (20 mg total) by mouth See admin instructions. Hold until you have finished your course of Protonix. (Patient not taking: Reported on 11/16/2023)     OVER THE COUNTER MEDICATION Take 1 tablet by mouth 2 (two) times daily. Juvenon Heart, Brain, and Cellular health supplement (Patient not taking: Reported on 11/16/2023)     OVER THE COUNTER MEDICATION Take 3 tablets by mouth daily. Juvenon BloodFlow-7 supplement (Patient not taking: Reported on 11/16/2023)     No current facility-administered medications for this encounter.    Atrial Fibrillation Management history:  Previous antiarrhythmic drugs: none Previous cardioversions: none Previous ablations: 10/19/23 Anticoagulation history: Eliquis 5 mg BID   ROS- All systems are reviewed and negative except as per the HPI above.  Physical Exam: BP 132/82   Pulse 60   Ht 6\' 3"  (1.905 m)   Wt 92.9 kg   BMI 25.60 kg/m   GEN: Well nourished, well  developed in no acute distress NECK: No JVD; No carotid bruits CARDIAC: Regular rate and rhythm, no murmurs, rubs, gallops RESPIRATORY:  Clear to auscultation without rales, wheezing or rhonchi  ABDOMEN: Soft, non-tender, non-distended EXTREMITIES:  No edema; No deformity   EKG today demonstrates  Vent. rate 60 BPM PR interval 198 ms QRS duration 92 ms QT/QTcB 398/398 ms P-R-T axes 48 42 44 Normal sinus rhythm Minimal voltage criteria for LVH, may be normal variant ( Sokolow-Lyon )  Borderline ECG When compared with ECG of 19-Oct-2023 15:25, PREVIOUS ECG IS PRESENT  Echo 12/13/22 demonstrated   1. Left ventricular ejection fraction, by estimation, is 60 to 65%. The  left ventricle has normal function. The left ventricle has no regional  wall motion abnormalities. There is mild concentric left ventricular  hypertrophy. Left ventricular diastolic  parameters are consistent with Grade I diastolic dysfunction (impaired  relaxation). Elevated left atrial pressure. The average left ventricular  global longitudinal strain is -22.6 %. The global longitudinal strain is  normal.   2. Right ventricular systolic function is normal. The right ventricular  size is normal. There is normal pulmonary artery systolic pressure.   3. Left atrial size was mildly dilated.   4. The mitral valve is normal in structure. Mild to moderate mitral valve  regurgitation.   5. The aortic valve is tricuspid. Aortic valve regurgitation is not  visualized. No aortic stenosis is present.   6. Aortic dilatation noted. There is moderate dilatation of the aortic  root, measuring 45 mm. There is mild dilatation of the ascending aorta,  measuring 39 mm.   7. The inferior vena cava is normal in size with greater than 50%  respiratory variability, suggesting right atrial pressure of 3 mmHg.   ASSESSMENT & PLAN CHA2DS2-VASc Score = 2  The patient's score is based upon: CHF History: 0 HTN History: 0 Diabetes  History: 0 Stroke History: 0 Vascular Disease History: 1 Age Score: 1 Gender Score: 0       ASSESSMENT AND PLAN: Paroxysmal Atrial Fibrillation (ICD10:  I48.0) The patient's CHA2DS2-VASc score is 2, indicating a 2.2% annual risk of stroke.   S/p Afib ablation on 10/19/23 by Dr. Lalla Brothers.  He is currently in NSR. Continue Lopressor 12.5 mg BID.   Secondary Hypercoagulable State (ICD10:  D68.69) The patient is at significant risk for stroke/thromboembolism based upon his CHA2DS2-VASc Score of 2.  Continue Apixaban (Eliquis).  Continue without interruption.     Follow up as scheduled with Dr. Lalla Brothers.    Lake Bells, PA-C  Af[facility] Clinic [facility] [address] [place], [place] [address] [phone]

## 2023-11-17 DIAGNOSIS — R21 Rash and other nonspecific skin eruption: Secondary | ICD-10-CM | POA: Diagnosis not present

## 2023-11-26 ENCOUNTER — Encounter: Payer: Self-pay | Admitting: Cardiology

## 2023-11-26 DIAGNOSIS — Q8743 Marfan's syndrome with skeletal manifestation: Secondary | ICD-10-CM | POA: Diagnosis not present

## 2023-11-26 DIAGNOSIS — H2513 Age-related nuclear cataract, bilateral: Secondary | ICD-10-CM | POA: Diagnosis not present

## 2023-11-27 ENCOUNTER — Other Ambulatory Visit: Payer: Self-pay

## 2023-11-27 DIAGNOSIS — I4891 Unspecified atrial fibrillation: Secondary | ICD-10-CM

## 2023-11-27 MED ORDER — APIXABAN 5 MG PO TABS: 5.0000 mg | ORAL_TABLET | Freq: Two times a day (BID) | ORAL | 1 refills | Status: DC

## 2023-11-27 NOTE — Telephone Encounter (Signed)
Prescription refill request for Eliquis received. Indication:afib Last office visit:11/24 Scr:0.79  10/24 Age: 72 Weight:92.9  kg  Prescription refilled

## 2023-12-06 DIAGNOSIS — E7800 Pure hypercholesterolemia, unspecified: Secondary | ICD-10-CM | POA: Diagnosis not present

## 2023-12-06 DIAGNOSIS — R972 Elevated prostate specific antigen [PSA]: Secondary | ICD-10-CM | POA: Diagnosis not present

## 2023-12-06 DIAGNOSIS — I7121 Aneurysm of the ascending aorta, without rupture: Secondary | ICD-10-CM | POA: Diagnosis not present

## 2023-12-06 DIAGNOSIS — D6869 Other thrombophilia: Secondary | ICD-10-CM | POA: Diagnosis not present

## 2023-12-06 DIAGNOSIS — Z6825 Body mass index (BMI) 25.0-25.9, adult: Secondary | ICD-10-CM | POA: Diagnosis not present

## 2023-12-06 DIAGNOSIS — Z0000 Encounter for general adult medical examination without abnormal findings: Secondary | ICD-10-CM | POA: Diagnosis not present

## 2023-12-06 DIAGNOSIS — I4891 Unspecified atrial fibrillation: Secondary | ICD-10-CM | POA: Diagnosis not present

## 2023-12-06 DIAGNOSIS — J309 Allergic rhinitis, unspecified: Secondary | ICD-10-CM | POA: Diagnosis not present

## 2023-12-06 DIAGNOSIS — D84821 Immunodeficiency due to drugs: Secondary | ICD-10-CM | POA: Diagnosis not present

## 2024-01-03 NOTE — Progress Notes (Addendum)
 [facility]   MULTIDISCIPLINARY HEART CLINIC                                     Cardiology Office Note:    Date:  01/05/2024   ID:  [name], DOB 1951/08/14, MRN [id number]  PCP:  Okey Carlin Redbird, MD  [facility] Cardiologist:  Powell FORBES Sorrow, MD (Inactive)  [facility] Electrophysiologist:  OLE ONEIDA HOLTS, MD   Referring MD: Okey Carlin Redbird, MD   Pre LAAO visit - Watchman scheduled 01/17/24  History of Present Illness:    Patrick Moyer is a 73 y.o. adult with a hx of ascending aortic aneurysm, nonobstructive CAD, Marfanoid habitus, HLD, rheumatoid arthritis on methotrexate/Celebrex  and paroxysmal atrial fibrillation who presents to clinic to discuss upcoming Watchman.   S/p Afib ablation on 10/19/23 by Dr. Holts and has been on Eliquis  5 mg BID. He is not felt to be a long term anticoagulation candidate secondary to concerns over associated bleeding risk while on chronic NSAIDs (take Celebrex  daily). Cardiac CT 11/07/23 showed a Windsock appendage suitable for a 31 mm Watchman FLX device, small PFO, and coronary calcium  score 140 isolated to the LAD (47 th percentile for age/sex).  Today the patient presents to clinic for follow up. He his here with his wife. He has not had any recent atrial fibrillation. He had palps only when he got an 11K bill from [facility] for an ecg because it was coded incorrectly. No CP or SOB. No LE edema, orthopnea or PND. No dizziness or syncope. No blood in stool or urine.    Past Medical History:  Diagnosis Date   ALLERGIC RHINITIS    Arthritis pain of wrist    Benign localized prostatic hyperplasia with lower urinary tract symptoms (LUTS)    Diverticulosis    ED (erectile dysfunction)    Environmental and seasonal allergies    Family history of Marfan syndrome    father, paternal uncle, and brother   GERD (gastroesophageal reflux disease)    High cholesterol    History of closed head injury    04-28-2020 per  pt yrs ago w/ no loc, residual mild memory loss which has resolved   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 42   per pt treated with INH for one year and cxr's since have been normal   HSV-1 infection    Marfanoid habitus    per pt has wing span is longer than his height, effects eyes (iris), hyperflexibility of ankle and hands,  AAA   Mild CAD    cardiologist--  dr nelson---  CTA 10-22-2019   Non-celiac gluten sensitivity    Numbness and tingling in both hands    per pt intermittantly due to cervical spine   OSA on CPAP    auto 8-10,  pt stated uses every night   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)   Pulmonary nodules    per pt followed by pcp (CTA and CT chest 10-22-2019 , stable)   Thoracic ascending aortic aneurysm [facility])    cardiologist--- dr lois moose--- CTA 10-22-2019  ascending aorta 40mm and aortic root 43mm   Wears glasses      Current Medications: Current Meds  Medication Sig   acetaminophen  (TYLENOL ) 500 MG tablet Take 500-1,000 mg by mouth at bedtime.   apixaban  (ELIQUIS ) 5  MG TABS tablet Take 1 tablet (5 mg total) by mouth 2 (two) times daily.   ascorbic acid (VITAMIN C) 500 MG tablet Take 500 mg by mouth 2 (two) times daily.   B Complex Vitamins (B COMPLEX 100 PO) Take 1 tablet by mouth in the morning and at bedtime.   Ca Carbonate-Mag Hydroxide (ROLAIDS PO) Take 1 tablet by mouth 2 (two) times daily.   celecoxib  (CELEBREX ) 200 MG capsule Take 200 mg by mouth 2 (two) times daily.   cholecalciferol (VITAMIN D) 1000 UNITS tablet Take 1,000 Units by mouth daily.   Coenzyme Q10 (COQ-10) 200 MG CAPS Take 200 mg by mouth every Monday, Wednesday, and Friday.   COLLAGEN PO Take 3 tablets by mouth 2 (two) times daily.   diclofenac  Sodium (VOLTAREN ) 1 % GEL Apply 1 Application topically 2 (two) times daily as needed (pain).   ELDERBERRY PO Take 1 capsule by mouth daily.   Emollient (BIO-OIL EX) Apply 1 Application topically 2 (two)  times daily. Apply to left knee   fexofenadine (ALLEGRA) 180 MG tablet Take 180 mg by mouth daily.   FIBER PO Take 2 tablets by mouth daily.   fluticasone  (FLONASE ) 50 MCG/ACT nasal spray Place 3-4 sprays into both nostrils in the morning and at bedtime.   folic acid (FOLVITE) 1 MG tablet Take 1 mg by mouth daily.   Glucosamine HCl (GLUCOSAMINE PO) Take 1 tablet by mouth at bedtime.   Guaifenesin (MUCINEX MAXIMUM STRENGTH) 1200 MG TB12 Take 1,200 mg by mouth daily.   Hypromellose (GENTEAL OP) Apply 1 Application to eye at bedtime.   L-Lysine 500 MG CAPS Take 500-1,000 mg by mouth See admin instructions. Take 1000 mg in the morning and 500 mg at night   Lutein -Zeaxanthin 25-5 MG CAPS Take 1 tablet by mouth daily.   Magnesium  400 MG TABS Take 400 mg by mouth at bedtime.   methotrexate (RHEUMATREX) 2.5 MG tablet Take 12.5 mg by mouth once a week. Caution:Chemotherapy. Protect from light.   metoprolol  tartrate (LOPRESSOR ) 25 MG tablet Take 1 tablet (25 mg total) by mouth 2 (two) times daily. (Patient taking differently: Take 12.5-25 mg by mouth See admin instructions. Take 12.5 twice daily, may take a 25 mg dose as needed for palpitations)   montelukast  (SINGULAIR ) 10 MG tablet Take 10 mg by mouth every evening.   Omega-3 Fatty Acids (FISH OIL ) 1000 MG CAPS Take 1,000 mg by mouth every evening.   omeprazole  (PRILOSEC ) 20 MG capsule Take 1 capsule (20 mg total) by mouth See admin instructions. Hold until you have finished your course of Protonix .   OVER THE COUNTER MEDICATION Take 1 tablet by mouth 2 (two) times daily. Juvenon Heart, Brain, and Cellular health supplement   OVER THE COUNTER MEDICATION Take 3 tablets by mouth daily. Juvenon BloodFlow-7 supplement   RESTASIS 0.05 % ophthalmic emulsion Place 1 drop into both eyes 2 (two) times daily.   rosuvastatin  (CRESTOR ) 5 MG tablet Take 1 tablet (5 mg total) by mouth 3 (three) times a week. (Patient taking differently: Take 5 mg by mouth every Monday,  Wednesday, and Friday.)   SODIUM FLUORIDE 5000 PPM 1.1 % GEL dental gel Take 1 application  by mouth 2 (two) times daily.   TART CHERRY PO Take 3,000 mg by mouth every evening.   tirzepatide (MOUNJARO) 2.5 MG/0.5ML Pen Inject 2.5 mg into the skin 8 days.   Turmeric 500 MG CAPS Take 500 mg by mouth 2 (two) times daily.   valACYclovir (  VALTREX) 1000 MG tablet Take 2,000 mg by mouth 2 (two) times daily as needed (cold sore).   Xylitol (XYLIMELTS MT) Use as directed 1 tablet in the mouth or throat at bedtime.      ROS:   Please see the history of present illness.    All other systems reviewed and are negative.  EKGs   EKG Interpretation Date/Time:  Friday January 04 2024 10:17:58 EST Ventricular Rate:  53 PR Interval:  214 QRS Duration:  90 QT Interval:  414 QTC Calculation: 388 R Axis:   53  Text Interpretation: Sinus bradycardia with 1st degree A-V block Minimal voltage criteria for LVH, may be normal variant ) Confirmed by Sebastian Collar [phone]) on 01/04/2024 10:21:51 AM   Risk Assessment/Calculations:    HAS-BLED score 2 Hypertension No  Abnormal renal and liver function (Dialysis, transplant, Cr >2.26 mg/dL /Cirrhosis or Bilirubin >2x Normal or AST/ALT/AP >3x Normal) No  Stroke No  Bleeding No  Labile INR (Unstable/high INR) No  Elderly (>65) Yes  Drugs or alcohol (>= 8 drinks/week, anti-plt or NSAID) No    CHA2DS2-VASc Score = 2  The patient's score is based upon: CHF History: 0 HTN History: 0 Diabetes History: 0 Stroke History: 0 Vascular Disease History: 1 Age Score: 1 Gender Score: 0  Physical Exam:    VS:  BP 118/80   Pulse (!) 57   Ht 6' 3 (1.905 m)   Wt 200 lb (90.7 kg)   SpO2 92%   BMI 25.00 kg/m     Wt Readings from Last 3 Encounters:  01/04/24 200 lb (90.7 kg)  11/16/23 204 lb 12.8 oz (92.9 kg)  10/19/23 201 lb (91.2 kg)     GEN: Well nourished, well developed in no acute distress ,tall  NECK: No JVD CARDIAC: RRR, no murmurs, rubs,  gallops RESPIRATORY:  Clear to auscultation without rales, wheezing or rhonchi  ABDOMEN: Soft, non-tender, non-distended EXTREMITIES:  No edema; No deformity.  ASSESSMENT:    1. Paroxysmal atrial fibrillation (HCC)   2. Hyperlipidemia, unspecified hyperlipidemia type   3. Aneurysm of ascending aorta without rupture (HCC)   4. Rheumatoid arthritis, involving unspecified site, unspecified whether rheumatoid factor present (HCC)     PLAN:    In order of problems listed above:  PAF: s/p ablation 10/19/23. In sinus today by ECG. Continue Eliquis  5mg  BID. He is not felt to be a long term anticoagulation candidate secondary to concerns over associated bleeding risk while on chronic NSAIDs (take Celebrex  daily). Set up for LAAO with Watchman 01/17/24 with Dr. Cindie. CBC/BMET today. Instruction letter and soap given to pt.   TAA: 4.2cm. This will be reassessed on post Watchman CT in 02/2024.   HLD: continue Crestor  5mg  daily. Lipid panel today.   RA: continue methotrexate and Celebrex .   Medication Adjustments/Labs and Tests Ordered: Current medicines are reviewed at length with the patient today.  Concerns regarding medicines are outlined above.  Orders Placed This Encounter  Procedures   Basic metabolic panel   CBC   Lipid panel   EKG 12-Lead   No orders of the defined types were placed in this encounter.   Patient Instructions  Medication Instructions:  Your physician recommends that you continue on your current medications as directed. Please refer to the Current Medication list given to you today.  *If you need a refill on your cardiac medications before your next appointment, please call your pharmacy*   Lab Work: LIPIDS,BMET, CBC   If you have  labs (blood work) drawn today and your tests are completely normal, you will receive your results only by: MyChart Message (if you have MyChart) OR A paper copy in the mail If you have any lab test that is abnormal or we need to  change your treatment, we will call you to review the results.   Testing/Procedures: You are scheduled for a Left Atrial Appendage Device Implantation on Thursday, January 23 with Dr. Ole Holts.     Signed, Lamarr Hummer, PA-C  01/05/2024 6:07 AM    [facility]

## 2024-01-04 ENCOUNTER — Ambulatory Visit: Payer: Medicare PPO | Attending: Physician Assistant

## 2024-01-04 VITALS — BP 118/80 | HR 57 | Ht 75.0 in | Wt 200.0 lb

## 2024-01-04 DIAGNOSIS — Z79899 Other long term (current) drug therapy: Secondary | ICD-10-CM | POA: Diagnosis not present

## 2024-01-04 DIAGNOSIS — I7121 Aneurysm of the ascending aorta, without rupture: Secondary | ICD-10-CM

## 2024-01-04 DIAGNOSIS — M069 Rheumatoid arthritis, unspecified: Secondary | ICD-10-CM

## 2024-01-04 DIAGNOSIS — I480 Paroxysmal atrial fibrillation: Secondary | ICD-10-CM | POA: Diagnosis not present

## 2024-01-04 DIAGNOSIS — E785 Hyperlipidemia, unspecified: Secondary | ICD-10-CM | POA: Diagnosis not present

## 2024-01-04 LAB — LIPID PANEL

## 2024-01-04 NOTE — Patient Instructions (Signed)
 Medication Instructions:  Your physician recommends that you continue on your current medications as directed. Please refer to the Current Medication list given to you today.  *If you need a refill on your cardiac medications before your next appointment, please call your pharmacy*   Lab Work: LIPIDS,BMET, CBC   If you have labs (blood work) drawn today and your tests are completely normal, you will receive your results only by: MyChart Message (if you have MyChart) OR A paper copy in the mail If you have any lab test that is abnormal or we need to change your treatment, we will call you to review the results.   Testing/Procedures: You are scheduled for a Left Atrial Appendage Device Implantation on Thursday, January 23 with Dr. Ole Holts.

## 2024-01-04 NOTE — Progress Notes (Deleted)
 Pre Surgical Assessment: 5 M Walk Test  32M=16.3ft  5 Meter Walk Test- trial 1: 6.97 seconds 5 Meter Walk Test- trial 2: 5.89 seconds 5 Meter Walk Test- trial 3: 6.55 seconds 5 Meter Walk Test Average: 6.47 seconds

## 2024-01-05 LAB — LIPID PANEL
Cholesterol, Total: 132 mg/dL (ref 100–199)
HDL: 42 mg/dL (ref >39)
LDL CALC COMMENT:: 3.1 ratio (ref 0.0–5.0)
LDL Chol Calc (NIH): 76 mg/dL (ref 0–99)
Triglycerides: 66 mg/dL (ref 0–149)
VLDL Cholesterol Cal: 14 mg/dL (ref 5–40)

## 2024-01-05 LAB — BASIC METABOLIC PANEL
BUN/Creatinine Ratio: 26 — ABNORMAL HIGH (ref 10–24)
BUN: 21 mg/dL (ref 8–27)
CO2: 24 mmol/L (ref 20–29)
Calcium: 9.4 mg/dL (ref 8.6–10.2)
Chloride: 107 mmol/L — ABNORMAL HIGH (ref 96–106)
Creatinine, Ser: 0.8 mg/dL (ref 0.76–1.27)
Glucose: 94 mg/dL (ref 70–99)
Potassium: 4.7 mmol/L (ref 3.5–5.2)
Sodium: 144 mmol/L (ref 134–144)
eGFR: 95 mL/min/{1.73_m2} (ref >59)

## 2024-01-05 LAB — CBC
Hematocrit: 43 % (ref 37.5–51.0)
Hemoglobin: 14.4 g/dL (ref 13.0–17.7)
MCH: 32.1 pg (ref 26.6–33.0)
MCHC: 33.5 g/dL (ref 31.5–35.7)
MCV: 96 fL (ref 79–97)
Platelets: 205 10*3/uL (ref 150–450)
RBC: 4.49 x10E6/uL (ref 4.14–5.80)
RDW: 12.6 % (ref 11.6–15.4)
WBC: 4.9 10*3/uL (ref 3.4–10.8)

## 2024-01-16 ENCOUNTER — Telehealth: Payer: Self-pay

## 2024-01-16 NOTE — Telephone Encounter (Signed)
Confirmed procedure date of 01/17/2024. Confirmed arrival time of 0530 for procedure time at 0730. Reviewed pre-procedure instructions with patient. Confirmed he skipped his Mounjaro injection. He was grateful for call and agreed with plan.

## 2024-01-17 ENCOUNTER — Other Ambulatory Visit: Payer: Self-pay

## 2024-01-17 ENCOUNTER — Inpatient Hospital Stay (HOSPITAL_COMMUNITY): Payer: Self-pay | Admitting: Anesthesiology

## 2024-01-17 ENCOUNTER — Encounter (HOSPITAL_COMMUNITY)
Admission: RE | Disposition: A | Discharge status: Home / Self Care | Payer: Medicare PPO | Source: Home / Self Care | Attending: Cardiology

## 2024-01-17 ENCOUNTER — Inpatient Hospital Stay (HOSPITAL_COMMUNITY): Payer: Medicare PPO

## 2024-01-17 ENCOUNTER — Encounter (HOSPITAL_COMMUNITY): Payer: Self-pay | Admitting: Cardiology

## 2024-01-17 ENCOUNTER — Inpatient Hospital Stay (HOSPITAL_COMMUNITY)
Admission: RE | Admit: 2024-01-17 | Discharge: 2024-01-17 | DRG: 274 | Disposition: A | Discharge status: Home / Self Care | Payer: Medicare PPO | Attending: Cardiology | Admitting: Cardiology

## 2024-01-17 DIAGNOSIS — Z885 Allergy status to narcotic agent status: Secondary | ICD-10-CM | POA: Diagnosis not present

## 2024-01-17 DIAGNOSIS — Z888 Allergy status to other drugs, medicaments and biological substances status: Secondary | ICD-10-CM

## 2024-01-17 DIAGNOSIS — I482 Chronic atrial fibrillation, unspecified: Secondary | ICD-10-CM

## 2024-01-17 DIAGNOSIS — Z7902 Long term (current) use of antithrombotics/antiplatelets: Secondary | ICD-10-CM | POA: Diagnosis not present

## 2024-01-17 DIAGNOSIS — Z791 Long term (current) use of non-steroidal anti-inflammatories (NSAID): Secondary | ICD-10-CM

## 2024-01-17 DIAGNOSIS — Z006 Encounter for examination for normal comparison and control in clinical research program: Secondary | ICD-10-CM

## 2024-01-17 DIAGNOSIS — G894 Chronic pain syndrome: Secondary | ICD-10-CM | POA: Diagnosis present

## 2024-01-17 DIAGNOSIS — Z79631 Long term (current) use of antimetabolite agent: Secondary | ICD-10-CM | POA: Diagnosis not present

## 2024-01-17 DIAGNOSIS — Z79899 Other long term (current) drug therapy: Secondary | ICD-10-CM | POA: Diagnosis not present

## 2024-01-17 DIAGNOSIS — E785 Hyperlipidemia, unspecified: Secondary | ICD-10-CM | POA: Diagnosis not present

## 2024-01-17 DIAGNOSIS — Z7901 Long term (current) use of anticoagulants: Secondary | ICD-10-CM | POA: Diagnosis not present

## 2024-01-17 DIAGNOSIS — M1990 Unspecified osteoarthritis, unspecified site: Secondary | ICD-10-CM | POA: Diagnosis present

## 2024-01-17 DIAGNOSIS — M069 Rheumatoid arthritis, unspecified: Secondary | ICD-10-CM | POA: Diagnosis present

## 2024-01-17 DIAGNOSIS — G4730 Sleep apnea, unspecified: Secondary | ICD-10-CM | POA: Diagnosis present

## 2024-01-17 DIAGNOSIS — I7121 Aneurysm of the ascending aorta, without rupture: Secondary | ICD-10-CM | POA: Diagnosis present

## 2024-01-17 DIAGNOSIS — I2510 Atherosclerotic heart disease of native coronary artery without angina pectoris: Secondary | ICD-10-CM | POA: Diagnosis present

## 2024-01-17 DIAGNOSIS — I480 Paroxysmal atrial fibrillation: Principal | ICD-10-CM | POA: Diagnosis present

## 2024-01-17 DIAGNOSIS — G4733 Obstructive sleep apnea (adult) (pediatric): Secondary | ICD-10-CM

## 2024-01-17 DIAGNOSIS — I4891 Unspecified atrial fibrillation: Secondary | ICD-10-CM | POA: Diagnosis present

## 2024-01-17 DIAGNOSIS — Z91040 Latex allergy status: Secondary | ICD-10-CM | POA: Diagnosis not present

## 2024-01-17 DIAGNOSIS — Z01818 Encounter for other preprocedural examination: Secondary | ICD-10-CM | POA: Diagnosis not present

## 2024-01-17 DIAGNOSIS — K219 Gastro-esophageal reflux disease without esophagitis: Secondary | ICD-10-CM | POA: Diagnosis present

## 2024-01-17 DIAGNOSIS — Z95818 Presence of other cardiac implants and grafts: Principal | ICD-10-CM

## 2024-01-17 DIAGNOSIS — R931 Abnormal findings on diagnostic imaging of heart and coronary circulation: Secondary | ICD-10-CM | POA: Diagnosis present

## 2024-01-17 HISTORY — PX: TRANSESOPHAGEAL ECHOCARDIOGRAM (CATH LAB): EP1270

## 2024-01-17 HISTORY — DX: Presence of other cardiac implants and grafts: Z95.818

## 2024-01-17 HISTORY — PX: LEFT ATRIAL APPENDAGE OCCLUSION: EP1229

## 2024-01-17 LAB — POCT ACTIVATED CLOTTING TIME: Activated Clotting Time: 279 s

## 2024-01-17 LAB — TYPE AND SCREEN
ABO/RH(D): AB POS
Antibody Screen: NEGATIVE

## 2024-01-17 LAB — SURGICAL PCR SCREEN
MRSA, PCR: NEGATIVE
Staphylococcus aureus: NEGATIVE

## 2024-01-17 LAB — ECHO TEE

## 2024-01-17 LAB — ABO/RH: ABO/RH(D): AB POS

## 2024-01-17 SURGERY — LEFT ATRIAL APPENDAGE OCCLUSION
Anesthesia: General

## 2024-01-17 MED ORDER — ROCURONIUM BROMIDE 10 MG/ML (PF) SYRINGE
PREFILLED_SYRINGE | INTRAVENOUS | Status: DC | PRN
  Administered 2024-01-17: 60 mg via INTRAVENOUS

## 2024-01-17 MED ORDER — ONDANSETRON HCL 4 MG/2ML IJ SOLN
INTRAMUSCULAR | Status: DC | PRN
  Administered 2024-01-17: 4 mg via INTRAVENOUS

## 2024-01-17 MED ORDER — PROPOFOL 10 MG/ML IV BOLUS
INTRAVENOUS | Status: DC | PRN
  Administered 2024-01-17: 150 mg via INTRAVENOUS

## 2024-01-17 MED ORDER — SODIUM CHLORIDE 0.9 % IV SOLN: INTRAVENOUS | Status: DC

## 2024-01-17 MED ORDER — CHLORHEXIDINE GLUCONATE 0.12 % MT SOLN: 15.0000 mL | Freq: Once | OROMUCOSAL | Status: AC

## 2024-01-17 MED ORDER — FENTANYL CITRATE (PF) 250 MCG/5ML IJ SOLN
INTRAMUSCULAR | Status: DC | PRN
  Administered 2024-01-17 (×2): 50 ug via INTRAVENOUS

## 2024-01-17 MED ORDER — LIDOCAINE 2% (20 MG/ML) 5 ML SYRINGE
INTRAMUSCULAR | Status: DC | PRN
  Administered 2024-01-17: 50 mg via INTRAVENOUS

## 2024-01-17 MED ORDER — HEPARIN SODIUM (PORCINE) 1000 UNIT/ML IJ SOLN
INTRAMUSCULAR | Status: DC | PRN
  Administered 2024-01-17: 13000 [IU] via INTRAVENOUS

## 2024-01-17 MED ORDER — CEFAZOLIN SODIUM-DEXTROSE 2-4 GM/100ML-% IV SOLN
INTRAVENOUS | Status: AC
  Filled 2024-01-17: qty 100

## 2024-01-17 MED ORDER — PROTAMINE SULFATE 10 MG/ML IV SOLN
INTRAVENOUS | Status: DC | PRN
  Administered 2024-01-17: 30 mg via INTRAVENOUS

## 2024-01-17 MED ORDER — SUGAMMADEX SODIUM 200 MG/2ML IV SOLN
INTRAVENOUS | Status: DC | PRN
  Administered 2024-01-17: 177 mg via INTRAVENOUS

## 2024-01-17 MED ORDER — SODIUM CHLORIDE 0.9% FLUSH: 3.0000 mL | Freq: Two times a day (BID) | INTRAVENOUS | Status: DC

## 2024-01-17 MED ORDER — PHENYLEPHRINE HCL-NACL 20-0.9 MG/250ML-% IV SOLN
INTRAVENOUS | Status: DC | PRN
  Administered 2024-01-17: 10 ug/min via INTRAVENOUS

## 2024-01-17 MED ORDER — DEXAMETHASONE SODIUM PHOSPHATE 10 MG/ML IJ SOLN
INTRAMUSCULAR | Status: DC | PRN
  Administered 2024-01-17: 10 mg via INTRAVENOUS

## 2024-01-17 MED ORDER — SODIUM CHLORIDE 0.9% FLUSH: 3.0000 mL | INTRAVENOUS | Status: DC | PRN

## 2024-01-17 MED ORDER — ONDANSETRON HCL 4 MG/2ML IJ SOLN: 4.0000 mg | Freq: Four times a day (QID) | INTRAMUSCULAR | Status: DC | PRN

## 2024-01-17 MED ORDER — IOHEXOL 350 MG/ML SOLN
INTRAVENOUS | Status: DC | PRN
  Administered 2024-01-17: 20 mL

## 2024-01-17 MED ORDER — PROPOFOL 500 MG/50ML IV EMUL
INTRAVENOUS | Status: DC | PRN
Start: 2024-01-17 — End: 2024-01-17
  Administered 2024-01-17: 50 ug/kg/min via INTRAVENOUS

## 2024-01-17 MED ORDER — CEFAZOLIN SODIUM-DEXTROSE 2-4 GM/100ML-% IV SOLN
2.0000 g | INTRAVENOUS | Status: AC
  Administered 2024-01-17: 2 g via INTRAVENOUS

## 2024-01-17 MED ORDER — FENTANYL CITRATE (PF) 100 MCG/2ML IJ SOLN
INTRAMUSCULAR | Status: AC
  Filled 2024-01-17: qty 2

## 2024-01-17 MED ORDER — CHLORHEXIDINE GLUCONATE 4 % EX SOLN: Freq: Once | CUTANEOUS | Status: DC

## 2024-01-17 MED ORDER — CHLORHEXIDINE GLUCONATE 0.12 % MT SOLN
OROMUCOSAL | Status: AC
  Administered 2024-01-17: 15 mL via OROMUCOSAL
  Filled 2024-01-17: qty 15

## 2024-01-17 MED ORDER — ACETAMINOPHEN 325 MG PO TABS: 650.0000 mg | ORAL_TABLET | ORAL | Status: DC | PRN

## 2024-01-17 MED ORDER — HEPARIN (PORCINE) IN NACL 1000-0.9 UT/500ML-% IV SOLN
INTRAVENOUS | Status: DC | PRN
  Administered 2024-01-17: 500 mL

## 2024-01-17 MED ORDER — PHENYLEPHRINE 80 MCG/ML (10ML) SYRINGE FOR IV PUSH (FOR BLOOD PRESSURE SUPPORT)
PREFILLED_SYRINGE | INTRAVENOUS | Status: DC | PRN
  Administered 2024-01-17: 80 ug via INTRAVENOUS

## 2024-01-17 SURGICAL SUPPLY — 22 items
CATH INFINITI 5FR ANG PIGTAIL (CATHETERS) IMPLANT
CLOSURE PERCLOSE PROSTYLE (VASCULAR PRODUCTS) IMPLANT
DEVICE WATCHMAN FLX PRO PROC (KITS) IMPLANT
DEVICE WATCHMAN FXD CRV PROC (KITS) IMPLANT
DILATOR VESSEL 38 20CM 12FR (INTRODUCER) IMPLANT
GUIDEWIRE INQWIRE 1.5J.035X260 (WIRE) ×1 IMPLANT
INQWIRE 1.5J .035X260CM (WIRE) ×1
KIT HEART LEFT (KITS) ×1 IMPLANT
KIT SHEA VERSACROSS LAAC CONNE (KITS) IMPLANT
PACK CARDIAC CATHETERIZATION (CUSTOM PROCEDURE TRAY) ×1 IMPLANT
PAD DEFIB RADIO PHYSIO CONN (PAD) ×1 IMPLANT
SHEATH PERFORMER 16FR 30 (SHEATH) IMPLANT
SHEATH PINNACLE 8F 10CM (SHEATH) IMPLANT
SHEATH PROBE COVER 6X72 (BAG) ×1 IMPLANT
SHIELD RADPAD SCOOP 12X17 (MISCELLANEOUS) ×1 IMPLANT
SYS WATCHMAN FXD DBL (SHEATH) ×1
SYSTEM WATCHMAN FXD DBL (SHEATH) IMPLANT
TRANSDUCER W/STOPCOCK (MISCELLANEOUS) ×1 IMPLANT
TUBING CIL FLEX 10 FLL-RA (TUBING) ×1 IMPLANT
WATCHMAN FLX PRO 31 (Prosthesis & Implant Heart) IMPLANT
WATCHMAN FLX PRO PROCEDURE (KITS) ×1 IMPLANT
WATCHMAN FXD CRV SYS PROCEDURE (KITS) ×1 IMPLANT

## 2024-01-17 NOTE — Anesthesia Preprocedure Evaluation (Addendum)
Anesthesia Evaluation  Patient identified by MRN, date of birth, ID band Patient awake    Reviewed: Allergy & Precautions, NPO status , Patient's Chart, lab work & pertinent test results  Airway Mallampati: I  TM Distance: >3 FB Neck ROM: Full    Dental  (+) Teeth Intact, Dental Advisory Given   Pulmonary sleep apnea and Continuous Positive Airway Pressure Ventilation    breath sounds clear to auscultation       Cardiovascular + CAD   Rhythm:Regular Rate:Normal  Echo:  1. Left ventricular ejection fraction, by estimation, is 60 to 65%. The  left ventricle has normal function. The left ventricle has no regional  wall motion abnormalities. There is mild concentric left ventricular  hypertrophy. Left ventricular diastolic  parameters are consistent with Grade I diastolic dysfunction (impaired  relaxation). Elevated left atrial pressure. The average left ventricular  global longitudinal strain is -22.6 %. The global longitudinal strain is  normal.   2. Right ventricular systolic function is normal. The right ventricular  size is normal. There is normal pulmonary artery systolic pressure.   3. Left atrial size was mildly dilated.   4. The mitral valve is normal in structure. Mild to moderate mitral valve  regurgitation.   5. The aortic valve is tricuspid. Aortic valve regurgitation is not  visualized. No aortic stenosis is present.   6. Aortic dilatation noted. There is moderate dilatation of the aortic  root, measuring 45 mm. There is mild dilatation of the ascending aorta,  measuring 39 mm.   7. The inferior vena cava is normal in size with greater than 50%  respiratory variability, suggesting right atrial pressure of 3 mmHg.     Neuro/Psych  Neuromuscular disease    GI/Hepatic Neg liver ROS,GERD  ,,  Endo/Other  negative endocrine ROS    Renal/GU negative Renal ROS     Musculoskeletal  (+) Arthritis ,    Abdominal    Peds  Hematology negative hematology ROS (+)   Anesthesia Other Findings   Reproductive/Obstetrics                             Anesthesia Physical Anesthesia Plan  ASA: 4  Anesthesia Plan: General   Post-op Pain Management: Tylenol PO (pre-op)*   Induction: Intravenous  PONV Risk Score and Plan: 3 and Ondansetron, Dexamethasone and Midazolam  Airway Management Planned: Oral ETT  Additional Equipment: Arterial line  Intra-op Plan:   Post-operative Plan: Extubation in OR  Informed Consent: I have reviewed the patients History and Physical, chart, labs and discussed the procedure including the risks, benefits and alternatives for the proposed anesthesia with the patient or authorized representative who has indicated his/her understanding and acceptance.     Dental advisory given  Plan Discussed with: CRNA  Anesthesia Plan Comments:        Anesthesia Quick Evaluation

## 2024-01-17 NOTE — Anesthesia Procedure Notes (Signed)
Procedure Name: Intubation Date/Time: 01/17/2024 7:45 AM  Performed by: Darryl Nestle, CRNAPre-anesthesia Checklist: Patient identified, Emergency Drugs available, Suction available and Patient being monitored Patient Re-evaluated:Patient Re-evaluated prior to induction Oxygen Delivery Method: Circle system utilized Preoxygenation: Pre-oxygenation with 100% oxygen Induction Type: IV induction Ventilation: Mask ventilation without difficulty Laryngoscope Size: Mac and 4 Grade View: Grade I Tube type: Oral Tube size: 7.0 mm Number of attempts: 1 Airway Equipment and Method: Stylet and Oral airway Placement Confirmation: ETT inserted through vocal cords under direct vision, positive ETCO2 and breath sounds checked- equal and bilateral Secured at: 23 cm Tube secured with: Tape Dental Injury: Teeth and Oropharynx as per pre-operative assessment

## 2024-01-17 NOTE — H&P (Signed)
Electrophysiology Office Note:     Date:  01/17/2024    ID:  Patrick Moyer, DOB April 14, 1951, MRN [id number]   PCP:  Daisy Floro, MD      [facility] Cardiologist:  Meriam Sprague, MD  [facility] Electrophysiologist:  Lanier Prude, MD    Referring MD: Meriam Sprague, MD    Chief Complaint: Atrial fibrillation   History of Present Illness:     Patrick Moyer is a 73 y.o. adult who presents for an evaluation of atrial fibrillation at the request of Dr. Shari Prows.    Their medical history includes ascending aortic aneurysm, nonobstructive coronary artery disease, marfanoid habitus, hyperlipidemia.  The patient has worn a heart monitor in November 2023 showing a 7% burden of atrial fibrillation.  He is on Eliquis for stroke prophylaxis.  He is very active and is hesitant to continue anticoagulation given the risks of bleeding.  At that appointment with Dr. Shari Prows he reported only taking aspirin consistently.   Today, he is accompanied by his wife. He complains of experiencing rapid heart beats during his a fib episodes. He reports that he 2 episodes lasting 45 minutes. His watch revealed burden of A fib values such as 7%, 15%, or 2% . He reports 4 spells a week and adds that triggers may include strong emotions. He does drink wine in moderation.    He only takes half of Metoprolol pill at night as he reports that he is unable to take the entire dose. He confirms that he takes Celebrex 200 MG daily. We discussed the catheter ablation procedure and watchman device implantation at great length.   He denies any chest pain, shortness of breath, or peripheral edema. No lightheadedness, headaches, syncope, orthopnea, or PND.   Their past medical, social and family history was reveiwed.  Doing well after PVI in October.   Presents for LAAO today. Procedure reviewed.    Current Medications: Active Medications         Current Meds  Medication Sig   acetaminophen  (TYLENOL) 500 MG tablet Take 500 mg by mouth at bedtime.   apixaban (ELIQUIS) 5 MG TABS tablet TAKE 1 TABLET BY MOUTH TWICE A DAY   Ascorbic Acid (VITAMIN C) 1000 MG tablet Take 1,000 mg by mouth daily.   B Complex Vitamins (B COMPLEX 100 PO) Take 1 tablet by mouth in the morning and at bedtime.   Ca Carbonate-Mag Hydroxide (ROLAIDS PO) Take by mouth as needed.   celecoxib (CELEBREX) 200 MG capsule Take 200 mg by mouth 2 (two) times daily.   cholecalciferol (VITAMIN D) 1000 UNITS tablet Take 1,000 Units by mouth in the morning and at bedtime.   diclofenac Sodium (VOLTAREN) 1 % GEL Apply 1 Application topically 3 (three) times daily as needed (pain).   fexofenadine (ALLEGRA) 180 MG tablet Take 180 mg by mouth daily.   FIBER PO Take 1 each by mouth daily.   fluticasone (FLONASE) 50 MCG/ACT nasal spray Place 1 spray into both nostrils in the morning and at bedtime.   folic acid (FOLVITE) 1 MG tablet Take 1 mg by mouth daily.   glucosamine-chondroitin 500-400 MG tablet Take 1 tablet by mouth daily.   guaiFENesin (MUCINEX) 600 MG 12 hr tablet Take 1,200 mg by mouth 2 (two) times daily.   L-Lysine 500 MG CAPS Take 1 capsule by mouth in the morning and at bedtime.   Lutein-Zeaxanthin 25-5 MG CAPS Take 1 tablet by mouth daily.   methotrexate (  RHEUMATREX) 2.5 MG tablet Take 10 mg by mouth daily.   metoprolol tartrate (LOPRESSOR) 25 MG tablet Take 1 tablet (25 mg total) by mouth 2 (two) times daily.   montelukast (SINGULAIR) 10 MG tablet Take 10 mg by mouth at bedtime as needed.   Omega-3 Fatty Acids (FISH OIL) 1000 MG CAPS Take 1 capsule (1,000 mg total) by mouth in the morning and at bedtime.   omeprazole (PRILOSEC) 20 MG capsule Take 20 mg by mouth daily.   Potassium Gluconate 550 (90 K) MG TABS Take 1 tablet by mouth as directed. 5 tablets weekly.   rosuvastatin (CRESTOR) 5 MG tablet Take 1 tablet (5 mg total) by mouth 3 (three) times a week.   SODIUM FLUORIDE 5000 PPM 1.1 % GEL dental gel Take 1  application by mouth daily.   Turmeric 500 MG CAPS Take 500 mg by mouth daily.   valACYclovir (VALTREX) 1000 MG tablet Take 1,000 mg by mouth daily as needed (cold sore).        Allergies:   Other, Cialis [tadalafil], Crestor [rosuvastatin], Soy protein [soybean oil], and Viagra [sildenafil]    ROS:   Please see the history of present illness.    (+)palpitations All other systems reviewed and are negative.   EKGs/Labs/Other Studies Reviewed:     The following studies were reviewed today:   November 22, 2022 ZIO 7% burden of atrial fibrillation   December 13, 2022 echo EF 60 to 65% RV normal Mildly dilated left atrium Mild to moderate MR Moderate dilation of the aortic root, 45 mm       Physical Exam:     VS:  BP 110/66   Pulse 59   Ht 6\' 3"  (1.905 m)   Wt 203 lb 9.6 oz (92.4 kg)   SpO2 98%   BMI 25.45 kg/m           Wt Readings from Last 3 Encounters:  02/14/23 203 lb 9.6 oz (92.4 kg)  01/01/23 203 lb (92.1 kg)  11/22/22 204 lb 9.6 oz (92.8 kg)      GEN:  Well nourished, well developed in no acute distress CARDIAC: RRR, no murmurs, rubs, gallops RESPIRATORY:  Clear to auscultation without rales, wheezing or rhonchi        Assessment ASSESSMENT:     1. Paroxysmal atrial fibrillation (HCC)   2. Coronary artery disease involving native coronary artery of native heart without angina pectoris     PLAN:     In order of problems listed above:   #Paroxysmal atrial fibrillation Symptomatic 7% burden on recent monitor Discussed treatment strategies for atrial fibrillation during today's visit including continued conservative management, antiarrhythmic drug therapy and catheter ablation.   ------------------  I have seen Patrick Moyer in the office today who is being considered for a Watchman left atrial appendage closure device. I believe they will benefit from this procedure given their history of atrial fibrillation, CHA2DS2-VASc score of 2 and unadjusted  ischemic stroke rate of 2.2% per year. Unfortunately, the patient is not felt to be a long term anticoagulation candidate secondary to concerns over associated bleeding risk while on chronic NSAIDs. The patient's chart has been reviewed and I feel that they would be a candidate for short term oral anticoagulation after Watchman implant.    It is my belief that after undergoing a LAA closure procedure, Patrick Moyer will not need long term anticoagulation which eliminates anticoagulation side effects and major bleeding risk.    Procedural risks  for the Watchman implant have been reviewed with the patient including a 0.5% risk of stroke, <1% risk of perforation and <1% risk of device embolization. Other risks include bleeding, vascular damage, tamponade, worsening renal function, and death. The patient understands these risk and wishes to proceed.       The published clinical data on the safety and effectiveness of WATCHMAN include but are not limited to the following: - Holmes DR, Everlene Farrier, Sick P et al. for the PROTECT AF Investigators. Percutaneous closure of the left atrial appendage versus warfarin therapy for prevention of stroke in patients with atrial fibrillation: a randomised non-inferiority trial. Lancet 2009; 374: 534-42. Everlene Farrier, Doshi SK, Isa Rankin D et al. on behalf of the PROTECT AF Investigators. Percutaneous Left Atrial Appendage Closure for Stroke Prophylaxis in Patients With Atrial Fibrillation 2.3-Year Follow-up of the PROTECT AF (Watchman Left Atrial Appendage System for Embolic Protection in Patients With Atrial Fibrillation) Trial. Circulation 2013; 127:720-729. - Alli O, Doshi S,  Kar S, Reddy VY, Sievert H et al. Quality of Life Assessment in the Randomized PROTECT AF (Percutaneous Closure of the Left Atrial Appendage Versus Warfarin Therapy for Prevention of Stroke in Patients With Atrial Fibrillation) Trial of Patients at Risk for Stroke With Nonvalvular Atrial  Fibrillation. J Am Coll Cardiol 2013; 61:1790-8. Aline August DR, Mia [place], Price M, Whisenant B, Sievert H, Doshi S, Huber K, Reddy V. Prospective randomized evaluation of the Watchman left atrial appendage Device in patients with atrial fibrillation versus long-term warfarin therapy; the PREVAIL trial. Journal of the [employer] of Cardiology, Vol. 4, No. 1, 2014, 1-11. - Kar S, Doshi SK, Sadhu A, Horton R, Osorio J et al. Primary outcome evaluation of a next-generation left atrial appendage closure device: results from the PINNACLE FLX trial. Circulation 2021;143(18)1754-1762.      After today's visit with the patient which was dedicated solely for shared decision making visit regarding LAA closure device, the patient decided to proceed with the LAA appendage closure procedure scheduled to be done in the near future at [facility]. Prior to the procedure, I would like to obtain a gated CT scan of the chest with contrast timed for PV/LA visualization.      HAS-BLED score 2 Hypertension No  Abnormal renal and liver function (Dialysis, transplant, Cr >2.26 mg/dL /Cirrhosis or Bilirubin >2x Normal or AST/ALT/AP >3x Normal) No  Stroke No  Bleeding No  Labile INR (Unstable/high INR) No  Elderly (>65) Yes  Drugs or alcohol (>= 8 drinks/week, anti-plt or NSAID) No    CHA2DS2-VASc Score = 2  The patient's score is based upon: CHF History: 0 HTN History: 0 Diabetes History: 0 Stroke History: 0 Vascular Disease History: 1 Age Score: 1 Gender Score: 0  -------------------------------  Doing well after October PVI. Presents today for LAAO. Procedure reviewed.   Signed, Rossie Muskrat. Lalla Brothers, MD, [facility], [facility] 01/17/2024 Electrophysiology [facility]

## 2024-01-17 NOTE — Anesthesia Procedure Notes (Signed)
Arterial Line Insertion Start/End1/23/2025 7:47 AM, 01/17/2024 7:48 AM Performed by: Darryl Nestle, CRNA, CRNA  Patient location: Pre-op. Preanesthetic checklist: patient identified, IV checked, site marked, risks and benefits discussed, surgical consent, monitors and equipment checked, pre-op evaluation, timeout performed and anesthesia consent Lidocaine 1% used for infiltration Left, radial was placed Catheter size: 20 G Hand hygiene performed  and maximum sterile barriers used   Attempts: 2 Procedure performed without using ultrasound guided technique. Following insertion, dressing applied and Biopatch. Post procedure assessment: normal and unchanged  Post procedure complications: unsuccessful attempts. Patient tolerated the procedure well with no immediate complications.

## 2024-01-17 NOTE — Progress Notes (Signed)
   01/17/24 1400  Vitals  Temp (!) 97.5 F (36.4 C)  Temp Source Oral  BP 125/72  MAP (mmHg) 87  BP Location Right Arm  BP Method Automatic  Patient Position (if appropriate) Lying  Pulse Rate 64  Pulse Rate Source Monitor  ECG Heart Rate 64  Resp 20  Level of Consciousness  Level of Consciousness Alert  MEWS COLOR  MEWS Score Color Green  Oxygen Therapy  SpO2 98 %  O2 Device Room Air  MEWS Score  MEWS Temp 0  MEWS Systolic 0  MEWS Pulse 0  MEWS RR 0  MEWS LOC 0  MEWS Score 0   Patient arrived from Cath lab to 4e04, vital signs obtained and monitor applied. Pt has discharge orders from Cath lab will discharge as ordered once seen by Dr. Lalla Brothers. [name], Randall An RN

## 2024-01-17 NOTE — Discharge Summary (Signed)
[facility]    Patient ID: Patrick Moyer,  MRN: [id number], DOB/AGE: September 12, 1951 73 y.o.  Admit date: 01/17/2024 Discharge date: 01/17/2024  Primary Care Physician: Daisy Floro, MD  Primary Cardiologist: Meriam Sprague, MD (Inactive)  Electrophysiologist: Lanier Prude, MD  Primary Discharge Diagnosis:  Paroxysmal Atrial Fibrillation Poor candidacy for long term anticoagulation due to  the need for chronic NSAID use with RA.   Procedures This Admission:  Transeptal Puncture Intra-procedural TEE which showed no LAA thrombus Left atrial appendage occlusive device placement on 01/17/24 by Dr. Lalla Brothers.   This study demonstrated: 1.Successful implantation of a WATCHMAN left atrial appendage occlusive device    2. TEE demonstrating no LAA thrombus 3. No early apparent complications.    Post Implant Anticoagulation Strategy: Continue Eliquis 5mg  by mouth twice daily for 45 days after implant. After 45 days, stop Eliquis and start Plavix 75mg  by mouth once daily to complete 6 months of post implant therapy. Plan for CT scan 60 days after implant to assess appendage patency and Watchman position.  Brief HPI: Patrick Moyer is a 73 y.o. adult with a history of ascending aortic aneurysm, nonobstructive CAD, Marfanoid habitus, HLD, rheumatoid arthritis on methotrexate/Celebrex and paroxysmal atrial fibrillation who presented to [facility] for elective LAAO with Watchman 01/17/24.     Patrick Moyer underwent AF ablation on 10/19/23 and has been on Eliquis 5 mg BID. He was not felt to be a long term anticoagulation candidate secondary to concerns over increased bleeding risks while taking chronic NSAIDs (take Celebrex daily) for RA pain relief. Cardiac CT 11/07/23 showed a Windsock appendage suitable for a 31 mm Watchman FLX device, small PFO, and coronary calcium score 140 isolated to the LAD (47 th percentile for age/sex).   Hospital Course:  The patient was admitted and  underwent left atrial appendage occlusive device placement with 31mm Watchman FLX Pro device. He was monitored in the post procedure setting and has done very well with no concerns. Given this, he is being considered for same day discharge later today. Groin site has been stable without evidence of hematoma or bleeding. Wound care and restrictions were reviewed with the patient. The patient has been scheduled for post procedure follow up with a provider in approximately 1 month. He will restart Eliquis this evening and continue for 45 days (03/02/24) then stop. At that time he will transition to Plavix 75mg  daily to complete 6 months (07/16/24) of therapy. He will need to transition off omeprazole during that time. He will require dental SBE during this time and should refrain from dental work or cleanings for at least 30 days post implant. SBE to be RXd at follow up. A repeat CT will be performed in approximately 60 days to ensure proper seal of the device.   Physical Exam: Vitals:   01/17/24 1030 01/17/24 1100 01/17/24 1200 01/17/24 1300  BP: 126/73 127/73 130/83 124/69  Pulse: (!) 52 (!) 52 65 61  Resp: 17 15 18 15   Temp:      TempSrc:      SpO2: 96% 99% 98% 96%  Weight:      Height:       Labs:   Lab Results  Component Value Date   WBC 4.9 01/04/2024   HGB 14.4 01/04/2024   HCT 43.0 01/04/2024   MCV 96 01/04/2024   PLT 205 01/04/2024   No results for input(s): "NA", "K", "CL", "CO2", "BUN", "CREATININE", "CALCIUM", "PROT", "BILITOT", "ALKPHOS", "  ALT", "AST", "GLUCOSE" in the last 168 hours.  Invalid input(s): "LABALBU"   Discharge Medications:  Allergies as of 01/17/2024       Reactions   Caffeine    Abdominal pain   Cialis [tadalafil]    Caused acute angle glaucoma   Crestor [rosuvastatin] Other (See Comments)   Causes joint aches, tolerates if not taking everyday   Oxycodone    Redness/ flushing to the face   Soy Protein Aris [place] Oil]    Gastrointestinal upset   Viagra  [sildenafil]    Caused acute angle glaucoma   Latex Rash   rash   Tape Rash        Medication List     TAKE these medications    acetaminophen 500 MG tablet Commonly known as: TYLENOL Take 1,000 mg by mouth 2 (two) times daily.   apixaban 5 MG Tabs tablet Commonly known as: Eliquis Take 1 tablet (5 mg total) by mouth 2 (two) times daily. Notes to patient: RESTART THIS EVENING, 01/17/24   ascorbic acid 500 MG tablet Commonly known as: VITAMIN C Take 500 mg by mouth 2 (two) times daily.   B COMPLEX 100 PO Take 1 tablet by mouth in the morning and at bedtime.   BIO-OIL EX Apply 1 Application topically 2 (two) times daily. Apply to left knee   celecoxib 200 MG capsule Commonly known as: CELEBREX Take 200 mg by mouth 2 (two) times daily.   cholecalciferol 1000 units tablet Commonly known as: VITAMIN D Take 1,000 Units by mouth daily.   COLLAGEN PO Take 3 tablets by mouth 2 (two) times daily.   CoQ-10 200 MG Caps Take 200 mg by mouth every Monday, Wednesday, and Friday.   ELDERBERRY PO Take 1 capsule by mouth daily.   fexofenadine 180 MG tablet Commonly known as: ALLEGRA Take 180 mg by mouth daily.   FIBER PO Take 2 tablets by mouth daily.   Fish Oil 1000 MG Caps Take 1,000 mg by mouth at bedtime.   fluticasone 50 MCG/ACT nasal spray Commonly known as: FLONASE Place 3-4 sprays into both nostrils in the morning and at bedtime.   folic acid 1 MG tablet Commonly known as: FOLVITE Take 1 mg by mouth daily.   GENTEAL OP Place 1 drop into both eyes at bedtime.   GLUCOSAMINE PO Take 1 tablet by mouth at bedtime.   L-Lysine 500 MG Caps Take 500-1,000 mg by mouth See admin instructions. Take 1000 mg in the morning and 500 mg at night   Lutein-Zeaxanthin 25-5 MG Caps Take 1 tablet by mouth daily.   Magnesium 400 MG Tabs Take 400 mg by mouth at bedtime.   methotrexate 2.5 MG tablet Commonly known as: RHEUMATREX Take 12.5 mg by mouth once a week.  Caution:Chemotherapy. Protect from light.   metoprolol tartrate 25 MG tablet Commonly known as: LOPRESSOR Take 1 tablet (25 mg total) by mouth 2 (two) times daily. What changed:  when to take this additional instructions   Mounjaro 2.5 MG/0.5ML Pen Generic drug: tirzepatide Inject 2.5 mg into the skin 8 days.   Mucinex Maximum Strength 1200 MG Tb12 Generic drug: Guaifenesin Take 1,200 mg by mouth daily.   omeprazole 20 MG capsule Commonly known as: PRILOSEC Take 1 capsule (20 mg total) by mouth See admin instructions. Hold until you have finished your course of Protonix. What changed: when to take this   OVER THE COUNTER MEDICATION Take 1 tablet by mouth 2 (two) times daily. Juvenon Heart, Brain,  and Cellular health supplement   OVER THE COUNTER MEDICATION Take 3 tablets by mouth daily. Juvenon BloodFlow-7 supplement   Restasis 0.05 % ophthalmic emulsion Generic drug: cycloSPORINE Place 1 drop into both eyes 2 (two) times daily.   ROLAIDS PO Take 1 tablet by mouth 2 (two) times daily.   rosuvastatin 5 MG tablet Commonly known as: CRESTOR Take 1 tablet (5 mg total) by mouth 3 (three) times a week.   Singulair 10 MG tablet Generic drug: montelukast Take 10 mg by mouth at bedtime.   Sodium Fluoride 5000 PPM 1.1 % Gel dental gel Generic drug: sodium fluoride Take 1 application  by mouth 2 (two) times daily.   TART CHERRY PO Take 3,000 mg by mouth every evening.   Turmeric 500 MG Caps Take 500 mg by mouth 2 (two) times daily.   valACYclovir 1000 MG tablet Commonly known as: VALTREX Take 2,000 mg by mouth 2 (two) times daily as needed (cold sore).   Voltaren 1 % Gel Generic drug: diclofenac Sodium Apply 1 Application topically 2 (two) times daily as needed (pain).   XYLIMELTS MT Use as directed 1 tablet in the mouth or throat at bedtime.        Disposition:  Home  Discharge Instructions     Call MD for:  difficulty breathing, headache or visual  disturbances   Complete by: As directed    Call MD for:  hives   Complete by: As directed    Call MD for:  persistant dizziness or light-headedness   Complete by: As directed    Call MD for:  persistant nausea and vomiting   Complete by: As directed    Call MD for:  redness, tenderness, or signs of infection (pain, swelling, redness, odor or green/yellow discharge around incision site)   Complete by: As directed    Call MD for:  severe uncontrolled pain   Complete by: As directed    Call MD for:  temperature >100.4   Complete by: As directed    Diet - low sodium heart healthy   Complete by: As directed    Discharge instructions   Complete by: As directed    [facility] Procedure, Care After  Procedure MD: Dr. Isidoro Donning Clinical Coordinator: Karsten Fells, RN  This sheet gives you information about how to care for yourself after your procedure. Your health care provider may also give you more specific instructions. If you have problems or questions, contact your health care provider.  What can I expect after the procedure? After the procedure, it is common to have: Bruising around your puncture site. Tenderness around your puncture site. Tiredness (fatigue).  Medication instructions It is very important to continue to take your blood thinner as directed by your doctor after the Watchman procedure. Call your procedure doctor's office with question or concerns. If you are on Coumadin (warfarin), you will have your INR checked the week after your procedure, with a goal INR of 2.0 - 3.0. Please follow your medication instructions on your discharge summary. Only take the medications listed on your discharge paperwork.  Follow up You will be seen in 1 month after your procedure You will have a repeat CT scan approximately 8 weeks after your procedure mark to check your device You will follow up the MD/APP who performed your procedure 6 months after your procedure The Watchman  Clinical Coordinator will check in with you from time to time, including 1 and 2 years after your procedure.  Follow these instructions at home: Puncture site care  Follow instructions from your health care provider about how to take care of your puncture site. Make sure you: If present, leave stitches (sutures), skin glue, or adhesive strips in place.  If a large square bandage is present, this may be removed 24 hours after surgery.  Check your puncture site every day for signs of infection. Check for: Redness, swelling, or pain. Fluid or blood. If your puncture site starts to bleed, lie down on your back, apply firm pressure to the area, and contact your health care provider. Warmth. Pus or a bad smell. Driving Do not drive yourself home if you received sedation Do not drive for at least 4 days after your procedure or however long your health care provider recommends. (Do not resume driving if you have previously been instructed not to drive for other health reasons.) Do not spend greater than 1 hour at a time in a car for the first 3 days. Stop and take a break with a 5 minute walk at least every hour.  Do not drive or use heavy machinery while taking prescription pain medicine.  Activity Avoid activities that take a lot of effort, including exercise, for at least 7 days after your procedure. For the first 3 days, avoid sitting for longer than one hour at a time.  Avoid alcoholic beverages, signing paperwork, or participating in legal proceedings for 24 hours after receiving sedation Do not lift anything that is heavier than 10 lb (4.5 kg) for one week.  No sexual activity for 1 week.  Return to your normal activities as told by your health care provider. Ask your health care provider what activities are safe for you. General instructions Take over-the-counter and prescription medicines only as told by your health care provider. Do not use any products that contain nicotine or  tobacco, such as cigarettes and e-cigarettes. If you need help quitting, ask your health care provider. You may shower after 24 hours, but Do not take baths, swim, or use a hot tub for 1 week.  Do not drink alcohol for 24 hours after your procedure. Keep all follow-up visits as told by your health care provider. This is important. Dental Work: You will require antibiotics prior to any dental work, including cleanings, for 6 months after your Watchman implantation to help protect you from infection. After 6 months, antibiotics are no longer required. Contact a health care provider if: You have redness, mild swelling, or pain around your puncture site. You have soreness in your throat or at your puncture site that does not improve after several days You have fluid or blood coming from your puncture site that stops after applying firm pressure to the area. Your puncture site feels warm to the touch. You have pus or a bad smell coming from your puncture site. You have a fever. You have chest pain or discomfort that spreads to your neck, jaw, or arm. You are sweating a lot. You feel nauseous. You have a fast or irregular heartbeat. You have shortness of breath. You are dizzy or light-headed and feel the need to lie down. You have pain or numbness in the arm or leg closest to your puncture site. Get help right away if: Your puncture site suddenly swells. Your puncture site is bleeding and the bleeding does not stop after applying firm pressure to the area. These symptoms may represent a serious problem that is an emergency. Do not wait to see if the  symptoms will go away. Get medical help right away. Call your local emergency services (911 in the [place] Do not drive yourself to the hospital. Summary After the procedure, it is normal to have bruising and tenderness at the puncture site in your groin, neck, or forearm. Check your puncture site every day for signs of infection. Get help right away  if your puncture site is bleeding and the bleeding does not stop after applying firm pressure to the area. This is a medical emergency.  This information is not intended to replace advice given to you by your health care provider. Make sure you discuss any questions you have with your health care provider.   Increase activity slowly   Complete by: As directed        Follow-up Information     Janetta Hora, PA-C Follow up on 02/29/2024.   Specialties: Cardiology, Radiology Why: Your appointment is at 9:25AM. Please arrive by 9:10AM Contact information: [address] [place] [address] [phone]                Duration of Discharge Encounter:  APP Time: 45 minutes   Signed, Georgie Chard, NP  01/17/2024 1:55 PM

## 2024-01-17 NOTE — Discharge Instructions (Signed)

## 2024-01-17 NOTE — Transfer of Care (Signed)
Immediate Anesthesia Transfer of Care Note  Patient: Patrick Moyer  Procedure(s) Performed: LEFT ATRIAL APPENDAGE OCCLUSION TRANSESOPHAGEAL ECHOCARDIOGRAM  Patient Location: Cath Lab  Anesthesia Type:General  Level of Consciousness: awake, alert , and oriented  Airway & Oxygen Therapy: Patient Spontanous Breathing  Post-op Assessment: Report given to RN and Post -op Vital signs reviewed and stable  Post vital signs: Reviewed and stable  Last Vitals:  Vitals Value Taken Time  BP 105/66 01/17/24 0900  Temp 98   Pulse 53 01/17/24 0901  Resp 16 01/17/24 0901  SpO2 96 % 01/17/24 0901  Vitals shown include unfiled device data.  Last Pain:  Vitals:   01/17/24 0629  TempSrc:   PainSc: 0-No pain         Complications: There were no known notable events for this encounter.

## 2024-01-17 NOTE — Progress Notes (Signed)
Pt/family given discharge instructions, medication lists, follow up appointments, and when to call the doctor.  Pt/family verbalizes understanding.  

## 2024-01-17 NOTE — Anesthesia Postprocedure Evaluation (Signed)
Anesthesia Post Note  Patient: Patrick Moyer  Procedure(s) Performed: LEFT ATRIAL APPENDAGE OCCLUSION TRANSESOPHAGEAL ECHOCARDIOGRAM     Patient location during evaluation: PACU Anesthesia Type: General Level of consciousness: awake and alert Pain management: pain level controlled Vital Signs Assessment: post-procedure vital signs reviewed and stable Respiratory status: spontaneous breathing, nonlabored ventilation, respiratory function stable and patient connected to nasal cannula oxygen Cardiovascular status: blood pressure returned to baseline and stable Postop Assessment: no apparent nausea or vomiting Anesthetic complications: no  There were no known notable events for this encounter.  Last Vitals:  Vitals:   01/17/24 1030 01/17/24 1100  BP: 126/73 127/73  Pulse: (!) 52 (!) 52  Resp: 17 15  Temp:    SpO2: 96% 99%    Last Pain:  Vitals:   01/17/24 1000  TempSrc: Temporal  PainSc:                  Shelton Silvas

## 2024-01-18 ENCOUNTER — Encounter (HOSPITAL_COMMUNITY): Payer: Self-pay | Admitting: Cardiology

## 2024-01-18 ENCOUNTER — Telehealth: Payer: Self-pay

## 2024-01-18 NOTE — Telephone Encounter (Signed)
  [facility]   Watchman Team  Contacted the patient regarding discharge from [facility] on 01/17/2024  The patient understands to follow up with Carlean Jews, PA on 02/29/2024  The patient understands discharge instructions? Yes  The patient understands medications and regimen? Yes   The patient reports groin site looks healthy with no S/S of bleeding or infection.  The patient understands to call with any questions or concerns prior to scheduled visit.

## 2024-01-20 ENCOUNTER — Ambulatory Visit (HOSPITAL_BASED_OUTPATIENT_CLINIC_OR_DEPARTMENT_OTHER): Admission: RE | Admit: 2024-01-20 | Payer: Medicare PPO | Source: Ambulatory Visit

## 2024-01-21 MED FILL — Fentanyl Citrate Preservative Free (PF) Inj 100 MCG/2ML: INTRAMUSCULAR | Qty: 2 | Status: AC

## 2024-01-23 DIAGNOSIS — H93292 Other abnormal auditory perceptions, left ear: Secondary | ICD-10-CM | POA: Diagnosis not present

## 2024-01-23 DIAGNOSIS — Z79899 Other long term (current) drug therapy: Secondary | ICD-10-CM | POA: Diagnosis not present

## 2024-01-23 DIAGNOSIS — H903 Sensorineural hearing loss, bilateral: Secondary | ICD-10-CM | POA: Diagnosis not present

## 2024-01-23 DIAGNOSIS — Z8669 Personal history of other diseases of the nervous system and sense organs: Secondary | ICD-10-CM | POA: Diagnosis not present

## 2024-01-23 DIAGNOSIS — R2689 Other abnormalities of gait and mobility: Secondary | ICD-10-CM | POA: Diagnosis not present

## 2024-01-23 DIAGNOSIS — H9313 Tinnitus, bilateral: Secondary | ICD-10-CM | POA: Diagnosis not present

## 2024-01-24 ENCOUNTER — Other Ambulatory Visit: Payer: Self-pay | Admitting: Physical Medicine and Rehabilitation

## 2024-01-24 DIAGNOSIS — H903 Sensorineural hearing loss, bilateral: Secondary | ICD-10-CM

## 2024-01-25 ENCOUNTER — Ambulatory Visit: Payer: Medicare PPO | Admitting: Cardiology

## 2024-01-29 ENCOUNTER — Other Ambulatory Visit: Payer: Self-pay

## 2024-01-29 ENCOUNTER — Emergency Department (HOSPITAL_BASED_OUTPATIENT_CLINIC_OR_DEPARTMENT_OTHER): Payer: Medicare PPO | Admitting: Radiology

## 2024-01-29 ENCOUNTER — Encounter (HOSPITAL_BASED_OUTPATIENT_CLINIC_OR_DEPARTMENT_OTHER): Payer: Self-pay | Admitting: Emergency Medicine

## 2024-01-29 ENCOUNTER — Telehealth: Payer: Self-pay | Admitting: Cardiology

## 2024-01-29 DIAGNOSIS — Z91040 Latex allergy status: Secondary | ICD-10-CM | POA: Insufficient documentation

## 2024-01-29 DIAGNOSIS — R918 Other nonspecific abnormal finding of lung field: Secondary | ICD-10-CM | POA: Diagnosis not present

## 2024-01-29 DIAGNOSIS — Z7901 Long term (current) use of anticoagulants: Secondary | ICD-10-CM | POA: Insufficient documentation

## 2024-01-29 DIAGNOSIS — R079 Chest pain, unspecified: Secondary | ICD-10-CM | POA: Diagnosis not present

## 2024-01-29 DIAGNOSIS — Z794 Long term (current) use of insulin: Secondary | ICD-10-CM | POA: Diagnosis not present

## 2024-01-29 DIAGNOSIS — R0989 Other specified symptoms and signs involving the circulatory and respiratory systems: Secondary | ICD-10-CM | POA: Diagnosis not present

## 2024-01-29 DIAGNOSIS — K5730 Diverticulosis of large intestine without perforation or abscess without bleeding: Secondary | ICD-10-CM | POA: Diagnosis not present

## 2024-01-29 DIAGNOSIS — R0789 Other chest pain: Secondary | ICD-10-CM | POA: Diagnosis not present

## 2024-01-29 LAB — CBC
HCT: 41.2 % (ref 39.0–52.0)
Hemoglobin: 14.4 g/dL (ref 13.0–17.0)
MCH: 32.3 pg (ref 26.0–34.0)
MCHC: 35 g/dL (ref 30.0–36.0)
MCV: 92.4 fL (ref 80.0–100.0)
Platelets: 209 10*3/uL (ref 150–400)
RBC: 4.46 MIL/uL (ref 4.22–5.81)
RDW: 12.5 % (ref 11.5–15.5)
WBC: 7.8 10*3/uL (ref 4.0–10.5)
nRBC: 0 % (ref 0.0–0.2)

## 2024-01-29 LAB — BASIC METABOLIC PANEL
Anion gap: 7 (ref 5–15)
BUN: 22 mg/dL (ref 8–23)
CO2: 27 mmol/L (ref 22–32)
Calcium: 9.4 mg/dL (ref 8.9–10.3)
Chloride: 106 mmol/L (ref 98–111)
Creatinine, Ser: 0.71 mg/dL (ref 0.61–1.24)
GFR, Estimated: >60 mL/min (ref >60)
Glucose, Bld: 90 mg/dL (ref 70–99)
Potassium: 4 mmol/L (ref 3.5–5.1)
Sodium: 140 mmol/L (ref 135–145)

## 2024-01-29 LAB — PROTIME-INR
INR: 1.2 (ref 0.8–1.2)
Prothrombin Time: 15.1 s (ref 11.4–15.2)

## 2024-01-29 LAB — TROPONIN I (HIGH SENSITIVITY)
Troponin I (High Sensitivity): 5 ng/L (ref <18)
Troponin I (High Sensitivity): 5 ng/L (ref <18)

## 2024-01-29 NOTE — Telephone Encounter (Signed)
 Patient's wife called the answering service this evening to report that patient had gone to the ED this evening. Patient is currently in the ED at [facility]. He developed chest pain and shortness of breath earlier today.  Assured patient's wife that the ED would work the patient up for chest pain, and will let us  know if they need further instruction   Rollo FABIENE Louder, PA-C 01/29/2024 6:19 PM

## 2024-01-29 NOTE — ED Triage Notes (Signed)
Pt via POV c/o central CP with SOB x 1 hour. Pt takes eliquis at home. Pt had a watchman inserted in left atrium on Jan 23. Pain is nonradiating with SOB, rated 8/10 tightness. Pt denies n/v/diaphoresis, dizziness.

## 2024-01-30 ENCOUNTER — Emergency Department (HOSPITAL_BASED_OUTPATIENT_CLINIC_OR_DEPARTMENT_OTHER): Payer: Medicare PPO

## 2024-01-30 ENCOUNTER — Emergency Department (HOSPITAL_BASED_OUTPATIENT_CLINIC_OR_DEPARTMENT_OTHER)
Admission: EM | Admit: 2024-01-30 | Discharge: 2024-01-30 | Disposition: A | Discharge status: Home / Self Care | Payer: Medicare PPO | Attending: Emergency Medicine | Admitting: Emergency Medicine

## 2024-01-30 DIAGNOSIS — R079 Chest pain, unspecified: Secondary | ICD-10-CM | POA: Diagnosis not present

## 2024-01-30 DIAGNOSIS — I7121 Aneurysm of the ascending aorta, without rupture: Secondary | ICD-10-CM | POA: Diagnosis not present

## 2024-01-30 DIAGNOSIS — K5730 Diverticulosis of large intestine without perforation or abscess without bleeding: Secondary | ICD-10-CM | POA: Diagnosis not present

## 2024-01-30 MED ORDER — MORPHINE SULFATE (PF) 4 MG/ML IV SOLN
4.0000 mg | Freq: Once | INTRAVENOUS | Status: AC
  Administered 2024-01-30: 4 mg via INTRAVENOUS
  Filled 2024-01-30: qty 1

## 2024-01-30 MED ORDER — ONDANSETRON HCL 4 MG/2ML IJ SOLN
4.0000 mg | Freq: Once | INTRAMUSCULAR | Status: AC
  Administered 2024-01-30: 4 mg via INTRAVENOUS
  Filled 2024-01-30: qty 2

## 2024-01-30 MED ORDER — APIXABAN 2.5 MG PO TABS
5.0000 mg | ORAL_TABLET | Freq: Two times a day (BID) | ORAL | Status: DC
  Administered 2024-01-30: 5 mg via ORAL
  Filled 2024-01-30: qty 2

## 2024-01-30 MED ORDER — PANTOPRAZOLE SODIUM 40 MG PO TBEC: 40.0000 mg | DELAYED_RELEASE_TABLET | Freq: Every day | ORAL | 0 refills | Status: DC

## 2024-01-30 MED ORDER — DICLOFENAC SODIUM 1 % EX GEL: 4.0000 g | Freq: Four times a day (QID) | CUTANEOUS | 0 refills | Status: AC

## 2024-01-30 MED ORDER — IOHEXOL 350 MG/ML SOLN
100.0000 mL | Freq: Once | INTRAVENOUS | Status: AC | PRN
  Administered 2024-01-30: 100 mL via INTRAVENOUS

## 2024-01-30 NOTE — ED Provider Notes (Signed)
 [place] EMERGENCY DEPARTMENT AT [facility] Provider Note   CSN: [id number] Arrival date & time: 01/29/24  1738     History  Chief Complaint  Patient presents with   Chest Pain    Patrick Moyer is a 73 y.o. adult.  73 yo M with a chief complaint of chest pain.  This started about 10 hours ago.  Worse with bending over and laughing certain positions.  Not worse with exertion.  Has been exercising this week without obvious symptoms.  Nothing otherwise seems to make it better or worse.  He tells me his whole family has Marfan's.  There have been multiple aortic dissections in his family.   Chest Pain      Home Medications Prior to Admission medications   Medication Sig Start Date End Date Taking? Authorizing Provider  diclofenac  Sodium (VOLTAREN ) 1 % GEL Apply 4 g topically 4 (four) times daily. 01/30/24  Yes Emil Share, DO  pantoprazole  (PROTONIX ) 40 MG tablet Take 1 tablet (40 mg total) by mouth daily. 01/30/24  Yes Emil Share, DO  acetaminophen  (TYLENOL ) 500 MG tablet Take 1,000 mg by mouth 2 (two) times daily.    [provider]  apixaban  (ELIQUIS ) 5 MG TABS tablet Take 1 tablet (5 mg total) by mouth 2 (two) times daily. 11/27/23   Terra Fairy PARAS, PA-C  ascorbic acid (VITAMIN C) 500 MG tablet Take 500 mg by mouth 2 (two) times daily.    [provider]  B Complex Vitamins (B COMPLEX 100 PO) Take 1 tablet by mouth in the morning and at bedtime.    [provider]  Ca Carbonate-Mag Hydroxide (ROLAIDS PO) Take 1 tablet by mouth 2 (two) times daily.    [provider]  celecoxib  (CELEBREX ) 200 MG capsule Take 200 mg by mouth 2 (two) times daily.    [provider]  cholecalciferol (VITAMIN D) 1000 UNITS tablet Take 1,000 Units by mouth daily.    [provider]  Coenzyme Q10 (COQ-10) 200 MG CAPS Take 200 mg by mouth every Monday, Wednesday, and Friday.    [provider]  COLLAGEN PO Take 3 tablets by mouth 2  (two) times daily.    [provider]  ELDERBERRY PO Take 1 capsule by mouth daily.    [provider]  Emollient (BIO-OIL EX) Apply 1 Application topically 2 (two) times daily. Apply to left knee    [provider]  fexofenadine (ALLEGRA) 180 MG tablet Take 180 mg by mouth daily. 05/17/21   [provider]  FIBER PO Take 2 tablets by mouth daily.    [provider]  fluticasone  (FLONASE ) 50 MCG/ACT nasal spray Place 3-4 sprays into both nostrils in the morning and at bedtime.    [provider]  folic acid (FOLVITE) 1 MG tablet Take 1 mg by mouth daily.    [provider]  Glucosamine HCl (GLUCOSAMINE PO) Take 1 tablet by mouth at bedtime.    [provider]  Guaifenesin (MUCINEX MAXIMUM STRENGTH) 1200 MG TB12 Take 1,200 mg by mouth daily.    [provider]  Hypromellose (GENTEAL OP) Place 1 drop into both eyes at bedtime.    [provider]  L-Lysine 500 MG CAPS Take 500-1,000 mg by mouth See admin instructions. Take 1000 mg in the morning and 500 mg at night    [provider]  Lutein -Zeaxanthin 25-5 MG CAPS Take 1 tablet by mouth daily. 05/01/22   Emelia Josefa HERO,  NP  Magnesium  400 MG TABS Take 400 mg by mouth at bedtime.    [provider]  methotrexate (RHEUMATREX) 2.5 MG tablet Take 12.5 mg by mouth once a week. Caution:Chemotherapy. Protect from light.    [provider]  metoprolol  tartrate (LOPRESSOR ) 25 MG tablet Take 1 tablet (25 mg total) by mouth 2 (two) times daily. Patient taking differently: Take 25 mg by mouth See admin instructions.  may take a 25 mg dose as needed for palpitations- pt is taking BID 08/24/23   Parthenia Olivia HERO, PA-C  montelukast  (SINGULAIR ) 10 MG tablet Take 10 mg by mouth at bedtime.    [provider]  Omega-3 Fatty Acids (FISH OIL ) 1000 MG CAPS Take 1,000 mg by mouth at bedtime.    [provider]  omeprazole  (PRILOSEC ) 20 MG  capsule Take 1 capsule (20 mg total) by mouth See admin instructions. Hold until you have finished your course of Protonix . Patient taking differently: Take 20 mg by mouth daily. Hold until you have finished your course of Protonix . 10/19/23   Lesia Ozell Barter, PA-C  OVER THE COUNTER MEDICATION Take 1 tablet by mouth 2 (two) times daily. [employer] Heart, Brain, and Cellular health supplement    [provider]  OVER THE COUNTER MEDICATION Take 3 tablets by mouth daily. Ju[employer]on BloodFlow-7 supplement    [provider]  RESTASIS 0.05 % ophthalmic emulsion Place 1 drop into both eyes 2 (two) times daily. 07/30/23   [provider]  rosuvastatin  (CRESTOR ) 5 MG tablet Take 1 tablet (5 mg total) by mouth 3 (three) times a week. 01/26/23   Hobart Powell BRAVO, MD  SODIUM FLUORIDE 5000 PPM 1.1 % GEL dental gel Take 1 application  by mouth 2 (two) times daily. 02/01/22   [provider]  TART CHERRY PO Take 3,000 mg by mouth every evening.    [provider]  tirzepatide CLOYDE) 2.5 MG/0.5ML Pen Inject 2.5 mg into the skin 8 days. 08/06/23   [provider]  Turmeric 500 MG CAPS Take 500 mg by mouth 2 (two) times daily.    [provider]  valACYclovir (VALTREX) 1000 MG tablet Take 2,000 mg by mouth 2 (two) times daily as needed (cold sore).    [provider]  Xylitol (XYLIMELTS MT) Use as directed 1 tablet in the mouth or throat at bedtime.    [provider]      Allergies    Caffeine, Cialis [tadalafil], Crestor  [rosuvastatin ], Oxycodone , Soy protein [soybean oil], Viagra [sildenafil], Latex, and Tape    Review of Systems   Review of Systems  Cardiovascular:  Positive for chest pain.    Physical Exam Updated Vital Signs BP 110/79   Pulse (!) 55   Temp 98 F (36.7 C) (Oral)   Resp 18   Ht 6' 3 (1.905 m)   Wt 87 kg   SpO2 97%   BMI 23.96 kg/m  Physical Exam Vitals and nursing note reviewed.   Constitutional:      General: Ed is not in acute distress.    Appearance: Ed is well-developed. Ed is not diaphoretic.  HENT:     Head: Normocephalic and atraumatic.  Eyes:     Pupils: Pupils are equal, round, and reactive to light.  Cardiovascular:     Rate and Rhythm: Normal rate and regular rhythm.     Heart sounds: No murmur heard.    No friction rub. No gallop.  Pulmonary:     Effort:  Pulmonary effort is normal.     Breath sounds: No wheezing or rales.  Abdominal:     General: There is no distension.     Palpations: Abdomen is soft.     Tenderness: There is no abdominal tenderness.  Musculoskeletal:        General: No tenderness.     Cervical back: Normal range of motion and neck supple.  Skin:    General: Skin is warm and dry.  Neurological:     Mental Status: Ed is alert and oriented to person, place, and time.  Psychiatric:        Behavior: Behavior normal.     ED Results / Procedures / Treatments   Labs (all labs ordered are listed, but only abnormal results are displayed) Labs Reviewed  BASIC METABOLIC PANEL  CBC  PROTIME-INR  TROPONIN I (HIGH SENSITIVITY)  TROPONIN I (HIGH SENSITIVITY)    EKG None  Radiology CT Angio Chest/Abd/Pel for Dissection W and/or Wo Contrast Result Date: 01/30/2024 CLINICAL DATA:  Acute aortic syndrome, chest pain, dyspnea. Family history of Marfan syndrome. Ascending aortic aneurysm. EXAM: CT ANGIOGRAPHY CHEST, ABDOMEN AND PELVIS TECHNIQUE: Non-contrast CT of the chest was initially obtained. Multidetector CT imaging through the chest, abdomen and pelvis was performed using the standard protocol during bolus administration of intravenous contrast. Multiplanar reconstructed images and MIPs were obtained and reviewed to evaluate the vascular anatomy. RADIATION DOSE REDUCTION: This exam was performed according to the departmental dose-optimization program which includes automated exposure control, adjustment of the mA and/or kV  according to patient size and/or use of iterative reconstruction technique. CONTRAST:  OMNIPAQUE  IOHEXOL  350 MG/ML SOLN COMPARISON:  01/18/2023 FINDINGS: CTA CHEST FINDINGS Cardiovascular: Mild limitation secondary to pulsation artifact. The ascending aorta,, is stable maximal caliber 4.3 cm at the sinuses of Valsalva and 4.2 cm in its ascending segment. The descending thoracic aorta is of normal caliber. No intramural hematoma or dissection. No significant atherosclerotic calcification. Arch vasculature is widely patent proximally. Mild coronary artery calcification. Watchman device expected position within the left atrial appendage. Global cardiac size is within limits. Trace pericardial effusion. The central pulmonary arteries caliber. No intraluminal filling defect identified through the segmental level to suggest acute pulmonary embolism. Mediastinum/Nodes: No enlarged mediastinal, hilar, or axillary lymph nodes. Thyroid  gland, trachea, and esophagus demonstrate no significant findings. Lungs/Pleura: Mild bibasilar atelectasis. Mild biapical pleuroparenchymal scarring. 7 mm noncalcified pulmonary nodule within the right middle lobe, axial image # 83/7, stable since remote prior examination of 01/09/2022 and safely considered benign. The lungs are otherwise clear. No pneumothorax or pleural effusion. No central obstructing lesion. Musculoskeletal: No chest wall abnormality. No acute or significant osseous findings. Review of the MIP images confirms the above findings. CTA ABDOMEN AND PELVIS FINDINGS VASCULAR Aorta: Normal caliber aorta without aneurysm, dissection, vasculitis or significant stenosis. Mild atherosclerotic calcification. Celiac: Patent without evidence of aneurysm, dissection, vasculitis or significant stenosis. SMA: Patent without evidence of aneurysm, dissection, vasculitis or significant stenosis. Renals: Both renal arteries are patent without evidence of aneurysm, dissection, vasculitis,  fibromuscular dysplasia or significant stenosis. IMA: Patent without evidence of aneurysm, dissection, vasculitis or significant stenosis. Inflow: Patent without evidence of aneurysm, dissection, vasculitis or significant stenosis. Veins: No obvious venous abnormality within the limitations of this arterial phase study. Review of the MIP images confirms the above findings. NON-VASCULAR Hepatobiliary: No focal liver abnormality is seen. No gallstones, gallbladder wall thickening, or biliary dilatation. Pancreas: Unremarkable Spleen: Unremarkable Adrenals/Urinary Tract: Adrenal glands are unremarkable. Kidneys are normal,  without renal calculi, focal lesion, or hydronephrosis. Bladder is unremarkable. Stomach/Bowel: Moderate sigmoid diverticulosis without superimposed acute inflammatory change. The stomach, small bowel, and large otherwise. No evidence of obstruction or focal inflammation. Status post appendectomy. No free intraperitoneal gas or fluid. Lymphatic: No pathologic adenopathy within the abdomen and pelvis. Reproductive: Prostate is unremarkable. Other: No abdominal wall hernia or abnormality. No abdominopelvic ascites. Musculoskeletal: Degenerative changes are seen within the lumbar spine. No acute bone abnormality. No lytic or blastic bone lesion. Review of the MIP images confirms the above findings. IMPRESSION: 1. Stable ascending aortic aneurysm measuring up to 4.3 cm in maximal caliber. No intramural hematoma or dissection. Recommend annual imaging followup by CTA or MRA. This recommendation follows 2010 ACCF/AHA/AATS/ACR/ASA/SCA/SCAI/SIR/STS/SVM Guidelines for the Diagnosis and Management of Patients with Thoracic Aortic Disease. Circulation. 2010; 121: Z733-z630. Aortic aneurysm NOS (ICD10-I71.9) 2. Mild coronary artery calcification. 3. Moderate sigmoid diverticulosis without superimposed acute inflammatory change. 4. 7 mm noncalcified pulmonary nodule within the right middle lobe, stable since  remote prior examination of 01/09/2022 and safely considered benign. Aortic Atherosclerosis (ICD10-I70.0). Electronically Signed   By: Dorethia Molt M.D.   On: 01/30/2024 01:41   DG Chest 2 View Result Date: 01/29/2024 CLINICAL DATA:  Chest pain. EXAM: CHEST - 2 VIEW COMPARISON:  Radiographs 01/17/2024.  CT 01/18/2023. FINDINGS: There are lower lung volumes with new streaky opacities the left lung base most consistent with atelectasis. There is no confluent airspace disease, edema, significant pleural effusion or pneumothorax. The heart size and mediastinal contours are stable status post interval placement of a Watchman left atrial appendage closure device. No acute osseous findings. IMPRESSION: Lower lung volumes with new streaky opacities at the left lung base most consistent with atelectasis. No other acute cardiopulmonary process. Electronically Signed   By: Elsie Perone M.D.   On: 01/29/2024 18:34    Procedures Procedures    Medications Ordered in ED Medications  apixaban  (ELIQUIS ) tablet 5 mg (5 mg Oral Given 01/30/24 0110)  iohexol  (OMNIPAQUE ) 350 MG/ML injection 100 mL (100 mLs Intravenous Contrast Given 01/30/24 0057)  morphine  (PF) 4 MG/ML injection 4 mg (4 mg Intravenous Given 01/30/24 0111)  ondansetron  (ZOFRAN ) injection 4 mg (4 mg Intravenous Given 01/30/24 0111)    ED Course/ Medical Decision Making/ A&P                                 Medical Decision Making Amount and/or Complexity of Data Reviewed Labs: ordered. Radiology: ordered.  Risk Prescription drug management.   73 yo M with a chief complaint of chest pain.  This been going on for about 10 hours now.  Patient is worried about aortic dissection.  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  He is also requesting his dose of Eliquis .  2 troponins are negative.  No significant electrolyte abnormalities.  No anemia.  Chest x-ray independently interpreted by me without widening of the  mediastinum, no focal infiltrate or pneumothorax.  CT angiogram of the chest with no obvious change to the patient's aortic aneurysm.  Feeling bit better after morphine .  Will have him follow-up as an outpatient.  2:01 AM:  I have discussed the diagnosis/risks/treatment options with the patient and family.  Evaluation and diagnostic testing in the emergency department does not suggest an emergent condition requiring admission or immediate intervention beyond what has been performed at this time.  They will follow up  with PCP. We also discussed returning to the ED immediately if new or worsening sx occur. We discussed the sx which are most concerning (e.g., sudden worsening pain, fever, inability to tolerate by mouth) that necessitate immediate return. Medications administered to the patient during their visit and any new prescriptions provided to the patient are listed below.  Medications given during this visit Medications  apixaban  (ELIQUIS ) tablet 5 mg (5 mg Oral Given 01/30/24 0110)  iohexol  (OMNIPAQUE ) 350 MG/ML injection 100 mL (100 mLs Intravenous Contrast Given 01/30/24 0057)  morphine  (PF) 4 MG/ML injection 4 mg (4 mg Intravenous Given 01/30/24 0111)  ondansetron  (ZOFRAN ) injection 4 mg (4 mg Intravenous Given 01/30/24 0111)     The patient appears reasonably screen and/or stabilized for discharge and I doubt any other medical condition or other [facility] requiring further screening, evaluation, or treatment in the ED at this time prior to discharge.          Final Clinical Impression(s) / ED Diagnoses Final diagnoses:  Nonspecific chest pain    Rx / DC Orders ED Discharge Orders          Ordered    pantoprazole  (PROTONIX ) 40 MG tablet  Daily        01/30/24 0157    diclofenac  Sodium (VOLTAREN ) 1 % GEL  4 times daily        01/30/24 0157              Emil Share, DO 01/30/24 0201

## 2024-01-30 NOTE — Discharge Instructions (Signed)
Follow up with your doctor in the office.  Return for worsening symptoms.

## 2024-02-05 ENCOUNTER — Encounter: Payer: Self-pay | Admitting: Cardiology

## 2024-02-05 DIAGNOSIS — I4891 Unspecified atrial fibrillation: Secondary | ICD-10-CM

## 2024-02-06 MED ORDER — APIXABAN 5 MG PO TABS: 5.0000 mg | ORAL_TABLET | Freq: Two times a day (BID) | ORAL | 0 refills | Status: DC

## 2024-02-06 NOTE — Telephone Encounter (Signed)
Prescription refill request for Eliquis received. Indication: Afib  Last office visit: 01/04/24 Janee Morn)  Scr: 0.71 (01/29/24)  Age: 73 Weight: 87kg  Appropriate dose. Refill sent.

## 2024-02-15 DIAGNOSIS — Z6823 Body mass index (BMI) 23.0-23.9, adult: Secondary | ICD-10-CM | POA: Diagnosis not present

## 2024-02-15 DIAGNOSIS — G629 Polyneuropathy, unspecified: Secondary | ICD-10-CM | POA: Diagnosis not present

## 2024-02-22 DIAGNOSIS — M25572 Pain in left ankle and joints of left foot: Secondary | ICD-10-CM | POA: Diagnosis not present

## 2024-02-22 DIAGNOSIS — M76821 Posterior tibial tendinitis, right leg: Secondary | ICD-10-CM | POA: Diagnosis not present

## 2024-02-22 DIAGNOSIS — M25571 Pain in right ankle and joints of right foot: Secondary | ICD-10-CM | POA: Diagnosis not present

## 2024-02-23 NOTE — Progress Notes (Unsigned)
 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 He reports a chronic history of allergic rhinitis, with allergy skin testing broadly positive in the past but never allergy vaccine. He moved here from [place] this year. There, he was adequately controlled with a steroid nasal spray, Mucinex and Zyrtec D. Symptoms worse in spring and fall. In [place] he is needing to have plain Zyrtec as an extra dose in the evenings. He wants to avoid allergy shots. He notices primarily rhinorrhea with less congestion and no wheeze or cough and no rash. There is no history of intolerance to latex, contrast, aspirin. Food allergies have been suspected, especially seasonings and cotton seed oil.Marland Kitchen He has not had insect stings. He has felt worse on work days and he says there has been obvious black mold in his building. There is a history of major renovation of abdomen because of mold and dampness problems. He is currently living in an apartment with some carpet and mostly vinyl floor. He denies problems there. He has no pets but did have a cath in the past. He reports intense local reaction to flu vaccine on more than one trial in the past. History of obstructive sleep apnea treated with BiPAP 11/6. We asked him to track down his original diagnostic report for our file. He never smoked, but chest x-ray in the last year showed overinflation and was interpreted as "COPD". He denies respiratory symptoms except as described above and wondered if the BiPAP caused overinflation. He is interested in evaluating with PFT.  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 He reports a chronic history of allergic rhinitis, with allergy skin testing broadly positive in the past but never allergy vaccine. Blames Soy for abd cramps, gas. Did have Nasal flu vaccine last Fall. Has not procured his old sleep study from [place].   Tested Normal in past for a1AT. Using BIPAP 11/6 regularly, based on testing in [place], but suspects the pressure may explain why his CXR's are read as over-inflated "?emphysema?" in recent years. A1AT tested normal. We discussed oral appliances as alternative to BiPAP. Significant increase in nasal congestion and nose blowing since off antihistamines for skin testing.  Allergy skin testing- broadly positive reactions for grass weed and tree pollens, dust mite, feathers and 1 mold. Isolated positive reaction on food testing for beef. He will watch this without restriction.  He wants allergy vaccine.  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 Using BiPAP 11/5 Advanced all night every night. His dentist, Durward Parcel, is starting to make oral appliances. Spring pollen has been unusually well tolerated and he credits his allergy vaccine which is still building. He has used Patanase and Veramyst nasal sprays with Zyrtec and Mucinex. He would like his nurse retrained to get his allergy shots so that he doesn't have to take time to come here and we discussed this. We reviewed previous imaging reports: Chest [facility]: Hyperinflation interpreted as chronic changes of COPD. Small lymph nodes including 5 mm noncalcified right middle lobe, 4 mm noncalcified right middle lobe, subpleural nodular densities in the apices. 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 Never smoked. PFT: 03/19/2012-scores are high normal. FEV1 4.96/129%, FEV1/FVC 0.81. FEF 25-75% 5.07/148% with 23% response to bronchodilator. TLC  119%, RV 113%, DLCO 130%. These indicate large lungs without the hyperinflation of emphysema suggested by radiologist interpretation of images.  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 We discussed expectations again. He is satisfied allergy vaccine is helping him. He is shifting to a [place] office but we discussed his plans to continue getting his vaccine injections here. Veramyst helps.. Using Zyrtec in the morning Zyrtec-D in the evening with patanase twice daily OSA doing well. Good compliance and control with BiPAP 11/5.  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 BiPAP 11/5- good compliance and control. Dymista nasal spray sample was no better than Patanase or Veramyst. He still complains of occasional dripping rhinorrhea.  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  was unable to tell a difference with the ipratropium nasal spray.  have we received records from [facility]? I reviewed outside records including the disc of images including CT chest. 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 Continues allergy vaccine 1:2, GH. We discussed current standard of care that he should get his injections in a medical office. He continues BiPAP 11.5/5.5- wife says he occasionally snores through it but he sleeps well. 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 Stable ascending aortic aneurysm measuring up to 4.3 cm in maximal caliber. No intramural hematoma or dissection. Recommend annual imaging followup by CTA or MRA. This recommendation follows 2010 ACCF/AHA/AATS/ACR/ASA/SCA/SCAI/SIR/STS/SVM Guidelines for the Diagnosis and Management of Patients with Thoracic Aortic Disease. Circulation. 2010; 121: Z610-R604. Aortic aneurysm NOS (ICD10-I71.9) 2. Mild coronary artery calcification. 3. Moderate sigmoid diverticulosis without superimposed acute inflammatory change. 4. 7 mm noncalcified pulmonary nodule within the right middle lobe, stable since remote prior examination of 01/09/2022 and safely considered benign. Aortic Atherosclerosis (ICD10-I70.0).   ROS-see HPI Constitutional:   No-   weight loss, night sweats, fevers, chills, fatigue, lassitude. HEENT:   No-  headaches, difficulty swallowing, tooth/dental problems, sore throat,      + sneezing, itching, ear ache, nasal congestion,+ post nasal drip,  CV:  No-   chest pain, orthopnea, PND, swelling in lower extremities, anasarca, dizziness, palpitations Resp: No-   shortness of breath with exertion or at rest.              No-   productive cough,  No non-productive cough,  No- coughing up of blood.              No-   change in color of mucus.  No- wheezing.   Skin: No-   rash or lesions. GI:  No-   heartburn, indigestion, abdominal pain, nausea, vomiting, GU: . MS:  No-   joint pain or swelling.   Neuro-  nothing unusual Psych:  No- change in mood or affect. No depression or anxiety.  No memory loss.  OBJ General- Alert, Oriented, Affect-appropriate, Distress- none acute; tall slender man Skin- rash-none, lesions- none, excoriation- none Lymphadenopathy- none Head- atraumatic            Eyes- Gross vision intact, PERRLA,  conjunctivae clear secretions            Ears- Hearing, canals-normal            Nose- narrow with mucus bridging, +Septal dev and external nose (hx fx), mucus, No-polyps, erosion, perforation.             Throat- Mallampati II , mucosa clear , drainage- none, tonsils- atrophic Neck- flexible , trachea midline, no stridor , thyroid nl, carotid no bruit Chest - symmetrical excursion , unlabored           Heart/CV- RRR , no murmur , no gallop  , no rub, nl s1 s2                           - JVD- none , edema- none, stasis changes- none, varices- none           Lung- clear to P&A, wheeze- none, cough- none , dullness-none, rub- none           Chest wall-  Abd-  Br/ Gen/ Rectal- Not done, not indicated Extrem- cyanosis- none, clubbing, none, atrophy- none, strength- nl Neuro- grossly intact to observation

## 2024-02-24 DIAGNOSIS — L89104 Pressure ulcer of unspecified part of back, stage 4: Secondary | ICD-10-CM | POA: Diagnosis not present

## 2024-02-25 ENCOUNTER — Ambulatory Visit

## 2024-02-25 ENCOUNTER — Ambulatory Visit: Payer: Medicare PPO | Admitting: Internal Medicine

## 2024-02-25 ENCOUNTER — Encounter: Payer: Self-pay | Admitting: Internal Medicine

## 2024-02-25 VITALS — BP 117/61 | HR 63 | Temp 97.6°F | Resp 18 | Ht 75.0 in | Wt 193.6 lb

## 2024-02-25 DIAGNOSIS — Z95818 Presence of other cardiac implants and grafts: Secondary | ICD-10-CM | POA: Diagnosis not present

## 2024-02-25 DIAGNOSIS — J9811 Atelectasis: Secondary | ICD-10-CM

## 2024-02-25 NOTE — Patient Instructions (Signed)
 Order- CXR   dx atelectasis  Please call if we can help

## 2024-02-27 DIAGNOSIS — M1990 Unspecified osteoarthritis, unspecified site: Secondary | ICD-10-CM | POA: Diagnosis not present

## 2024-02-27 DIAGNOSIS — G4733 Obstructive sleep apnea (adult) (pediatric): Secondary | ICD-10-CM | POA: Diagnosis not present

## 2024-02-27 DIAGNOSIS — M0609 Rheumatoid arthritis without rheumatoid factor, multiple sites: Secondary | ICD-10-CM | POA: Diagnosis not present

## 2024-02-27 DIAGNOSIS — Z79899 Other long term (current) drug therapy: Secondary | ICD-10-CM | POA: Diagnosis not present

## 2024-02-27 DIAGNOSIS — M25511 Pain in right shoulder: Secondary | ICD-10-CM | POA: Diagnosis not present

## 2024-02-28 ENCOUNTER — Ambulatory Visit
Admission: RE | Admit: 2024-02-28 | Discharge: 2024-02-28 | Disposition: A | Discharge status: Home / Self Care | Payer: Medicare PPO | Source: Ambulatory Visit | Attending: Physical Medicine and Rehabilitation | Admitting: Physical Medicine and Rehabilitation

## 2024-02-28 DIAGNOSIS — H903 Sensorineural hearing loss, bilateral: Secondary | ICD-10-CM | POA: Diagnosis not present

## 2024-02-28 DIAGNOSIS — L89104 Pressure ulcer of unspecified part of back, stage 4: Secondary | ICD-10-CM | POA: Diagnosis not present

## 2024-02-28 DIAGNOSIS — G319 Degenerative disease of nervous system, unspecified: Secondary | ICD-10-CM | POA: Diagnosis not present

## 2024-02-28 DIAGNOSIS — I6782 Cerebral ischemia: Secondary | ICD-10-CM | POA: Diagnosis not present

## 2024-02-28 MED ORDER — GADOPICLENOL 0.5 MMOL/ML IV SOLN
8.5000 mL | Freq: Once | INTRAVENOUS | Status: AC | PRN
  Administered 2024-02-28: 8.5 mL via INTRAVENOUS

## 2024-02-29 ENCOUNTER — Other Ambulatory Visit: Payer: Self-pay | Admitting: *Deleted

## 2024-02-29 ENCOUNTER — Ambulatory Visit: Payer: Medicare PPO | Attending: Physician Assistant | Admitting: Physician Assistant

## 2024-02-29 ENCOUNTER — Telehealth: Payer: Self-pay | Admitting: Physician Assistant

## 2024-02-29 VITALS — BP 110/80 | HR 58 | Ht 75.0 in | Wt 196.0 lb

## 2024-02-29 DIAGNOSIS — I480 Paroxysmal atrial fibrillation: Secondary | ICD-10-CM

## 2024-02-29 DIAGNOSIS — M069 Rheumatoid arthritis, unspecified: Secondary | ICD-10-CM | POA: Diagnosis not present

## 2024-02-29 DIAGNOSIS — Z95818 Presence of other cardiac implants and grafts: Secondary | ICD-10-CM | POA: Diagnosis not present

## 2024-02-29 DIAGNOSIS — I7121 Aneurysm of the ascending aorta, without rupture: Secondary | ICD-10-CM | POA: Diagnosis not present

## 2024-02-29 DIAGNOSIS — E7800 Pure hypercholesterolemia, unspecified: Secondary | ICD-10-CM

## 2024-02-29 DIAGNOSIS — I10 Essential (primary) hypertension: Secondary | ICD-10-CM

## 2024-02-29 DIAGNOSIS — K219 Gastro-esophageal reflux disease without esophagitis: Secondary | ICD-10-CM | POA: Diagnosis not present

## 2024-02-29 MED ORDER — PANTOPRAZOLE SODIUM 40 MG PO TBEC: 40.0000 mg | DELAYED_RELEASE_TABLET | Freq: Every day | ORAL | 0 refills | Status: DC

## 2024-02-29 MED ORDER — CLOPIDOGREL BISULFATE 75 MG PO TABS: 75.0000 mg | ORAL_TABLET | Freq: Every day | ORAL | 1 refills | Status: DC

## 2024-02-29 MED ORDER — AMOXICILLIN 500 MG PO TABS: 2000.0000 mg | ORAL_TABLET | ORAL | 12 refills | Status: AC

## 2024-02-29 NOTE — Patient Instructions (Addendum)
 Medication Instructions:  Your physician has recommended you make the following change in your medication:   STOP Eliquis  START Plavix 75 mg (TOMORROW)  taking 1 daily  STOP Omeprasole  START Amoxicillin 500 taking 4 tablets prior to any dental procedures   *If you need a refill on your cardiac medications before your next appointment, please call your pharmacy*   Lab Work: None ordered  If you have labs (blood work) drawn today and your tests are completely normal, you will receive your results only by: MyChart Message (if you have MyChart) OR A paper copy in the mail If you have any lab test that is abnormal or we need to change your treatment, we will call you to review the results.   Testing/Procedures: None ordered today   Follow-Up: At [facility], you and your health needs are our priority.  As part of our continuing mission to provide you with exceptional heart care, we have created designated Provider Care Teams.  These Care Teams include your primary Cardiologist (physician) and Advanced Practice Providers (APPs -  Physician Assistants and Nurse Practitioners) who all work together to provide you with the care you need, when you need it.  We recommend signing up for the patient portal called "MyChart".  Sign up information is provided on this After Visit Summary.  MyChart is used to connect with patients for Virtual Visits (Telemedicine).  Patients are able to view lab/test results, encounter notes, upcoming appointments, etc.  Non-urgent messages can be sent to your provider as well.   To learn more about what you can do with MyChart, go to [email or web address].    Your next appointment:   1st available   Provider:   You may see Lanier Prude, MD or one of the following Advanced Practice Providers on your designated Care Team:   Francis Dowse, [place] Casimiro Needle "Mardelle Matte" [place], [place] Sherie Don, NP Canary Brim, NP You will see one of the following  Advanced Practice Providers on your designated Care Team:   Francis Dowse, Charlott Holler "Mardelle Matte" [place], PA-C Sherie Don, NP Canary Brim, NP      Other Instructions     1st Floor: - Lobby - Registration  - Pharmacy  - Lab - Cafe  2nd Floor: - PV Lab - Diagnostic Testing (echo, CT, nuclear med)  3rd Floor: - Vacant  4th Floor: - TCTS (cardiothoracic surgery) - AFib Clinic - Structural Heart Clinic - Vascular Surgery  - Vascular Ultrasound  5th Floor: - HeartCare Cardiology (general and EP) - Clinical Pharmacy for coumadin, hypertension, lipid, weight-loss medications, and med management appointments    Valet parking services will be available as well.

## 2024-02-29 NOTE — Progress Notes (Signed)
 [facility]   MULTIDISCIPLINARY HEART VALVE CLINIC                                     Cardiology Office Note:    Date:  02/29/2024   ID:  Patrick Moyer, DOB 11/29/1951, MRN [id number]  PCP:  Daisy Floro, MD  [facility] Cardiologist:  Meriam Sprague, MD (Inactive) --> Dr. Marcy Salvo [facility] Electrophysiologist:  Lanier Prude, MD   Referring MD: Daisy Floro, MD   1 month s/p LAAO with Watchman   History of Present Illness:    Patrick Moyer is a 73 y.o. adult with a hx of an ascending aortic aneurysm, HTN, nonobstructive CAD, Marfanoid habitus, HLD, rheumatoid arthritis on methotrexate/Celebrex and paroxysmal atrial fibrillation s/p ablation and Watchman who presents to clinic for follow up.  S/p Afib ablation on 10/19/23 by Dr. Lalla Brothers and has been on Eliquis 5 mg BID. He is not felt to be a long term anticoagulation candidate secondary to concerns over associated bleeding risk while on chronic NSAIDs (takes Celebrex daily). He underwent successful Watchman implant with a 31mm Watchman FLX Pro on 01/17/24. Medication plan: continue Eliquis 5mg  BID for 45 days after implant. After 45 days, stop Eliquis and start Plavix 75mg  by mouth once daily to complete 6 months of post implant therapy.   Was seen in on the ER on 01/30/24 for non cardiac chest pain. CT angio chest/abd/pelvis showed stable 4.3 cm aneurysm with no dissection.   Recent labs done at PCP on 02/27/24 which showed Creat 0.91, BUN 30, NA 144, K 4.0 (personally reviewed).    Today the patient presents to clinic for follow up. Here alone. No CP or SOB. No LE edema, orthopnea or PND. No dizziness or syncope. No blood in stool or urine. No palpitations.  He leaves for [place] to stay at his house on [place] from 6/25-9/19/25. When he gets back he will establish care with Dr. Jacques Navy.    Past Medical History:  Diagnosis Date   ALLERGIC RHINITIS    Arthritis pain of wrist     Benign localized prostatic hyperplasia with lower urinary tract symptoms (LUTS)    Diverticulosis    ED (erectile dysfunction)    Environmental and seasonal allergies    Family history of Marfan syndrome    father, paternal uncle, and brother   GERD (gastroesophageal reflux disease)    High cholesterol    History of closed head injury    04-28-2020 per pt yrs ago w/ no loc, residual mild memory loss which has resolved   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 76   per pt treated with INH for one year and cxr's since have been normal   HSV-1 infection    Marfanoid habitus    per pt has wing span is longer than his height, effects eyes (iris), hyperflexibility of ankle and hands,  AAA   Mild CAD    cardiologist--  dr Delton See---  CTA 10-22-2019   Non-celiac gluten sensitivity    Numbness and tingling in both hands    per pt intermittantly due to cervical spine   OSA on CPAP    auto 8-10,  pt stated uses every night   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)   Presence of Watchman left atrial appendage closure device 01/17/2024  31mm Watchman FLX Pro device placed by Dr. Lalla Brothers   Pulmonary nodules    per pt followed by pcp (CTA and CT chest 10-22-2019 , stable)   Thoracic ascending aortic aneurysm [facility])    cardiologist--- dr Eloy End--- CTA 10-22-2019  ascending aorta 40mm and aortic root 43mm   Wears glasses      Current Medications: Current Meds  Medication Sig   acetaminophen (TYLENOL) 500 MG tablet Take 1,000 mg by mouth 2 (two) times daily.   amoxicillin (AMOXIL) 500 MG tablet Take 4 tablets (2,000 mg total) by mouth as directed. 1 hour prior to dental work including cleanings   ascorbic acid (VITAMIN C) 500 MG tablet Take 500 mg by mouth 2 (two) times daily.   B Complex Vitamins (B COMPLEX 100 PO) Take 1 tablet by mouth in the morning and at bedtime.   Ca Carbonate-Mag Hydroxide (ROLAIDS PO) Take 1 tablet by mouth 2 (two) times daily.    celecoxib (CELEBREX) 200 MG capsule Take 200 mg by mouth 2 (two) times daily.   cholecalciferol (VITAMIN D) 1000 UNITS tablet Take 1,000 Units by mouth daily.   clopidogrel (PLAVIX) 75 MG tablet Take 1 tablet (75 mg total) by mouth daily.   Coenzyme Q10 (COQ-10) 200 MG CAPS Take 200 mg by mouth every Monday, Wednesday, and Friday.   COLLAGEN PO Take 3 tablets by mouth 2 (two) times daily.   diclofenac Sodium (VOLTAREN) 1 % GEL Apply 4 g topically 4 (four) times daily.   ELDERBERRY PO Take 1 capsule by mouth daily.   Emollient (BIO-OIL EX) Apply 1 Application topically 2 (two) times daily. Apply to left knee   fexofenadine (ALLEGRA) 180 MG tablet Take 180 mg by mouth daily.   FIBER PO Take 2 tablets by mouth daily.   fluticasone (FLONASE) 50 MCG/ACT nasal spray Place 3-4 sprays into both nostrils in the morning and at bedtime.   folic acid (FOLVITE) 1 MG tablet Take 1 mg by mouth daily.   gabapentin (NEURONTIN) 300 MG capsule Take 150 mg by mouth at bedtime. Take 1/2 tab at bedtime   Glucosamine HCl (GLUCOSAMINE PO) Take 1 tablet by mouth at bedtime.   Guaifenesin (MUCINEX MAXIMUM STRENGTH) 1200 MG TB12 Take 1,200 mg by mouth daily.   Hypromellose (GENTEAL OP) Place 1 drop into both eyes at bedtime.   L-Lysine 500 MG CAPS Take 500-1,000 mg by mouth See admin instructions. Take 1000 mg in the morning and 500 mg at night   Lutein-Zeaxanthin 25-5 MG CAPS Take 1 tablet by mouth daily.   Magnesium 400 MG TABS Take 400 mg by mouth at bedtime.   methotrexate (RHEUMATREX) 2.5 MG tablet Take 12.5 mg by mouth once a week. Caution:Chemotherapy. Protect from light.   metoprolol tartrate (LOPRESSOR) 25 MG tablet Take 1 tablet (25 mg total) by mouth 2 (two) times daily. (Patient taking differently: Take 25 mg by mouth See admin instructions.  may take a 25 mg dose as needed for palpitations- pt is taking BID)   montelukast (SINGULAIR) 10 MG tablet Take 10 mg by mouth at bedtime.   Omega-3 Fatty Acids (FISH  OIL) 1000 MG CAPS Take 1,000 mg by mouth at bedtime.   OVER THE COUNTER MEDICATION Take 1 tablet by mouth 2 (two) times daily. Juvenon Heart, Brain, and Cellular health supplement   OVER THE COUNTER MEDICATION Take 3 tablets by mouth daily. Juvenon BloodFlow-7 supplement   RESTASIS 0.05 % ophthalmic emulsion Place 1 drop into both eyes 2 (two)  times daily.   rosuvastatin (CRESTOR) 5 MG tablet Take 1 tablet (5 mg total) by mouth 3 (three) times a week.   SODIUM FLUORIDE 5000 PPM 1.1 % GEL dental gel Take 1 application  by mouth 2 (two) times daily.   TART CHERRY PO Take 3,000 mg by mouth every evening.   tirzepatide [facility]) 2.5 MG/0.5ML Pen Inject 2.5 mg into the skin 8 days. Every eight (8) days   triamterene-hydrochlorothiazide (DYAZIDE) 37.5-25 MG capsule Take 1 capsule by mouth daily.   Turmeric 500 MG CAPS Take 500 mg by mouth 2 (two) times daily.   valACYclovir (VALTREX) 1000 MG tablet Take 2,000 mg by mouth 2 (two) times daily as needed (cold sore).   Xylitol (XYLIMELTS MT) Use as directed 1 tablet in the mouth or throat at bedtime.   [DISCONTINUED] apixaban (ELIQUIS) 5 MG TABS tablet Take 1 tablet (5 mg total) by mouth 2 (two) times daily.   [DISCONTINUED] omeprazole (PRILOSEC) 20 MG capsule Take 1 capsule (20 mg total) by mouth See admin instructions. Hold until you have finished your course of Protonix. (Patient taking differently: Take 20 mg by mouth daily. Hold until you have finished your course of Protonix.)   [DISCONTINUED] pantoprazole (PROTONIX) 40 MG tablet Take 1 tablet (40 mg total) by mouth daily.      ROS:   Please see the history of present illness.    All other systems reviewed and are negative.  EKGs       Risk Assessment/Calculations:    CHA2DS2-VASc Score = 3   This indicates a 3.2% annual risk of stroke. The patient's score is based upon: CHF History: 0 HTN History: 1 Diabetes History: 0 Stroke History: 0 Vascular Disease History: 1 Age Score:  1 Gender Score: 0           Physical Exam:    VS:  BP 110/80 (BP Location: Left Arm)   Pulse (!) 58   Ht 6\' 3"  (1.905 m)   Wt 196 lb (88.9 kg)   SpO2 99%   BMI 24.50 kg/m     Wt Readings from Last 3 Encounters:  02/29/24 196 lb (88.9 kg)  02/25/24 193 lb 9.6 oz (87.8 kg)  01/29/24 191 lb 11.2 oz (87 kg)     GEN: Well nourished, well developed in no acute distress, tall, white male NECK: No JVD CARDIAC: RRR, no murmurs, rubs, gallops RESPIRATORY:  Clear to auscultation without rales, wheezing or rhonchi  ABDOMEN: Soft, non-tender, non-distended EXTREMITIES:  No edema; No deformity.    ASSESSMENT:    1. Presence of Watchman left atrial appendage closure device   2. Paroxysmal atrial fibrillation (HCC)   3. Aneurysm of ascending aorta without rupture (HCC)   4. Pure hypercholesterolemia   5. Rheumatoid arthritis, involving unspecified site, unspecified whether rheumatoid factor present (HCC)   6. Gastroesophageal reflux disease, unspecified whether esophagitis present   7. Essential hypertension     PLAN:    In order of problems listed above:  S/p Watchman: -- Plan to stop Eliquis today and start Plavix 75mg  daily to complete 6 months post procedure (stop 07/16/24). -- He had a mild-moderately elevated CAC on pre Watchman CT, but I do not feel strongly that he needs to be on aspirin indefinitely.  -- Post Watchman CT set up for 03/28/24. Instruction letter given to pt.  -- Recent labs done at PCP on 02/27/24 which showed creat 0.91 (personally reviewed).  -- He understands the need for SBE prophylaxis x  6 months. Amoxicillin called in.  PAF:  -- S/p ablation 10/19/23.  -- Sounds regular on exam today.  -- Continue metoprolol tartrate 25mg  BID.  -- Now s/p LAAO with Watchman and Eliquis being discontinued today.   TAA:  -- 4.3cm on recent CT angio chest/abd/pelvis on 01/30/24. -- Has a history of Marfans and family history of aneurysm rupture.  -- This will need to  be followed on a yearly basis.    HLD:  -- Continue Crestor 5mg  three times a week.  -- Lipids on 01/04/24 showed LDL 76, TC 132, HDL 42 which are at goal.    RA:  -- Continue methotrexate 2.5mg  daily.  -- Continue Celebrex 200mg  BID.  GERD:  -- Omeprazole switched to Pantoprazole while on Plavix -- He plans to continue this indefinitely.   HTN: -- BP 110/80 today. -- Continue Dyazide 37.5-25mg  daily and metoprolol tartrate 25mg  BID.   Medication Adjustments/Labs and Tests Ordered: Current medicines are reviewed at length with the patient today.  Concerns regarding medicines are outlined above.  No orders of the defined types were placed in this encounter.  Meds ordered this encounter  Medications   clopidogrel (PLAVIX) 75 MG tablet    Sig: Take 1 tablet (75 mg total) by mouth daily.    Dispense:  90 tablet    Refill:  1   pantoprazole (PROTONIX) 40 MG tablet    Sig: Take 1 tablet (40 mg total) by mouth daily.    Dispense:  90 tablet    Refill:  0   amoxicillin (AMOXIL) 500 MG tablet    Sig: Take 4 tablets (2,000 mg total) by mouth as directed. 1 hour prior to dental work including cleanings    Dispense:  12 tablet    Refill:  12    Supervising Provider:   Tonny Bollman [[id number]]    Patient Instructions  Medication Instructions:  Your physician has recommended you make the following change in your medication:   STOP Eliquis  START Plavix 75 mg (TOMORROW)  taking 1 daily  STOP Omeprasole  START Amoxicillin 500 taking 4 tablets prior to any dental procedures   *If you need a refill on your cardiac medications before your next appointment, please call your pharmacy*   Lab Work: None ordered  If you have labs (blood work) drawn today and your tests are completely normal, you will receive your results only by: MyChart Message (if you have MyChart) OR A paper copy in the mail If you have any lab test that is abnormal or we need to change your treatment, we will call  you to review the results.   Testing/Procedures: None ordered today   Follow-Up: At [facility], you and your health needs are our priority.  As part of our continuing mission to provide you with exceptional heart care, we have created designated Provider Care Teams.  These Care Teams include your primary Cardiologist (physician) and Advanced Practice Providers (APPs -  Physician Assistants and Nurse Practitioners) who all work together to provide you with the care you need, when you need it.  We recommend signing up for the patient portal called "MyChart".  Sign up information is provided on this After Visit Summary.  MyChart is used to connect with patients for Virtual Visits (Telemedicine).  Patients are able to view lab/test results, encounter notes, upcoming appointments, etc.  Non-urgent messages can be sent to your provider as well.   To learn more about what you can do with  MyChart, go to [email or web address].    Your next appointment:   1st available   Provider:   You may see Lanier Prude, MD or one of the following Advanced Practice Providers on your designated Care Team:   Francis Dowse, [place] Casimiro Needle "Mardelle Matte" [place], [place] Sherie Don, NP Canary Brim, NP You will see one of the following Advanced Practice Providers on your designated Care Team:   Francis Dowse, Charlott Holler "Mardelle Matte" [place], PA-C Sherie Don, NP Canary Brim, NP      Other Instructions     1st Floor: - Lobby - Registration  - Pharmacy  - Lab - Cafe  2nd Floor: - PV Lab - Diagnostic Testing (echo, CT, nuclear med)  3rd Floor: - Vacant  4th Floor: - TCTS (cardiothoracic surgery) - AFib Clinic - Structural Heart Clinic - Vascular Surgery  - Vascular Ultrasound  5th Floor: - HeartCare Cardiology (general and EP) - Clinical Pharmacy for coumadin, hypertension, lipid, weight-loss medications, and med management appointments    Valet parking services will be available  as well.         Signed, Cline Crock, PA-C  02/29/2024 11:19 AM    [facility]

## 2024-02-29 NOTE — Telephone Encounter (Signed)
 Labs from PCP on 02/27/24

## 2024-03-18 ENCOUNTER — Other Ambulatory Visit (HOSPITAL_COMMUNITY): Payer: Medicare PPO

## 2024-03-19 ENCOUNTER — Other Ambulatory Visit (HOSPITAL_COMMUNITY): Payer: Medicare PPO

## 2024-03-19 ENCOUNTER — Ambulatory Visit: Payer: Self-pay | Admitting: Family Medicine

## 2024-03-19 NOTE — Telephone Encounter (Signed)
 Chest tightness ongoing x2 hours  Symptoms: 3/10 pain Pertinent Negatives: Patient denies pain radiation, difficulty breathing, dizziness, nausea  Disposition: [x] ED  Additional Notes: This RN advised pt to go to the ED now based off constant chest pain for 2 hours. This RN offered to call pt an ambulance but pt declined at states he has someone to take him to ED now. Pt verbalized understanding and agrees to plan.    Copied from CRM [id number]. Topic: Clinical - Red Word Triage >> Mar 19, 2024  4:17 PM Nila Nephew wrote: Red Word that prompted transfer to Nurse Triage: Patient is experiencing ongoing tightness in chest after exercise - related to a prior appointment but ongoing and not getting better with active treatment per patient. Relates it similarly to lungs being tight and not stretching out? Reason for Disposition  [1] Chest pain lasts > 5 minutes AND [2] age > 10  Answer Assessment - Initial Assessment Questions LOCATION: "Where does it hurt?"       Both sides of chest RADIATION: "Does the pain go anywhere else?" (e.g., into neck, jaw, arms, back)     Denies ONSET: "When did the chest pain begin?" (Minutes, hours or days)      Ongoing x2 hours SEVERITY: "How bad is the pain?"  (e.g., Scale 1-10; mild, moderate, or severe)    - MILD (1-3): doesn't interfere with normal activities     - MODERATE (4-7): interferes with normal activities or awakens from sleep    - SEVERE (8-10): excruciating pain, unable to do any normal activities       3/10 OTHER SYMPTOMS: "Do you have any other symptoms?" (e.g., dizziness, nausea, vomiting, sweating, fever, difficulty breathing, cough)       Denies  Protocols used: Chest Pain-A-AH

## 2024-03-20 DIAGNOSIS — H903 Sensorineural hearing loss, bilateral: Secondary | ICD-10-CM | POA: Diagnosis not present

## 2024-03-25 DIAGNOSIS — L720 Epidermal cyst: Secondary | ICD-10-CM | POA: Diagnosis not present

## 2024-03-25 DIAGNOSIS — L821 Other seborrheic keratosis: Secondary | ICD-10-CM | POA: Diagnosis not present

## 2024-03-25 DIAGNOSIS — L918 Other hypertrophic disorders of the skin: Secondary | ICD-10-CM | POA: Diagnosis not present

## 2024-03-25 DIAGNOSIS — D1801 Hemangioma of skin and subcutaneous tissue: Secondary | ICD-10-CM | POA: Diagnosis not present

## 2024-03-25 DIAGNOSIS — L814 Other melanin hyperpigmentation: Secondary | ICD-10-CM | POA: Diagnosis not present

## 2024-03-27 ENCOUNTER — Institutional Professional Consult (permissible substitution): Payer: Medicare PPO | Admitting: Internal Medicine

## 2024-03-28 ENCOUNTER — Ambulatory Visit (HOSPITAL_COMMUNITY)
Admission: RE | Admit: 2024-03-28 | Discharge: 2024-03-28 | Disposition: A | Discharge status: Home / Self Care | Payer: Medicare PPO | Source: Ambulatory Visit | Attending: Cardiology | Admitting: Cardiology

## 2024-03-28 DIAGNOSIS — I480 Paroxysmal atrial fibrillation: Secondary | ICD-10-CM | POA: Insufficient documentation

## 2024-03-28 MED ORDER — IOHEXOL 350 MG/ML SOLN
95.0000 mL | Freq: Once | INTRAVENOUS | Status: AC | PRN
  Administered 2024-03-28: 95 mL via INTRAVENOUS

## 2024-03-29 ENCOUNTER — Encounter: Payer: Self-pay | Admitting: Cardiology

## 2024-04-02 DIAGNOSIS — R1310 Dysphagia, unspecified: Secondary | ICD-10-CM | POA: Diagnosis not present

## 2024-04-02 DIAGNOSIS — R079 Chest pain, unspecified: Secondary | ICD-10-CM | POA: Diagnosis not present

## 2024-04-02 DIAGNOSIS — K219 Gastro-esophageal reflux disease without esophagitis: Secondary | ICD-10-CM | POA: Diagnosis not present

## 2024-04-03 ENCOUNTER — Telehealth: Payer: Self-pay

## 2024-04-03 NOTE — Telephone Encounter (Signed)
-----   Message from Lanier Prude sent at 03/29/2024 11:53 AM EDT ----- CT reviewed. The appendage is sealed and the Watchman is in great position. This is good news! OK to continue with planned post implant medication regimen.   Sheria Lang T. Lalla Brothers, MD, [facility], [facility] Cardiac Electrophysiology

## 2024-04-03 NOTE — Telephone Encounter (Signed)
 Reviewed results with patient who verbalized understanding.   Confirmed the patient STOPPED ELIQUIS and STARTED PLAVIX as instructed. He is going to [place] for 3 months in May, so he will keep his scheduled appointment with Dr. Lalla Brothers on 4/28 as a check-up prior to travel. Will cancel his CTA of the chest scheduled 4/23 as he had a CTA in February when he went to the ER. He was grateful for call and agreed with plan.

## 2024-04-04 DIAGNOSIS — H90A21 Sensorineural hearing loss, unilateral, right ear, with restricted hearing on the contralateral side: Secondary | ICD-10-CM | POA: Diagnosis not present

## 2024-04-07 ENCOUNTER — Telehealth: Payer: Self-pay | Admitting: *Deleted

## 2024-04-07 ENCOUNTER — Other Ambulatory Visit: Payer: Self-pay | Admitting: Physician Assistant

## 2024-04-07 NOTE — Telephone Encounter (Signed)
   Pre-operative Risk Assessment    Patient Name: Patrick Moyer  DOB: 1951/07/18 MRN: [id number]   Date of last office visit: 02/29/24 Friddie Jetty, [facility] Date of next office visit: 04/21/24 DR. LAMBERT   Request for Surgical Clearance    Procedure:   EGD  Date of Surgery:  Clearance 04/17/24                                Surgeon:  DR. HUNG Surgeon's Group or Practice Name:  [facility] Phone number:  [phone] Fax number:  [phone]   Type of Clearance Requested:   - Medical  - Pharmacy:  Hold Clopidogrel (Plavix)     Type of Anesthesia:   PROPOFOL   Additional requests/questions:    Signed, Joanie Duprey   04/07/2024, 1:42 PM

## 2024-04-10 NOTE — Telephone Encounter (Signed)
 Requesting office sent duplicate request inquiring about clearance. I will forward request back to preop as office is inquiring if cleared; see notes from Palmer Bobo, NP.

## 2024-04-10 NOTE — Telephone Encounter (Signed)
 Dr. Marven Slimmer, please see surgical request for EGD below.  Requesting office is asking for Plavix hold.  Patient is s/p Watchman implant with a 31mm Watchman FLX Pro on 01/17/24 and started on Plavix on 02/29/2024.  He does have a follow-up visit with you on 04/21/2024 however procedure date is 04/17/2024.  Are you able to comment on Plavix hold for this patient? Please route your response to P CV DIV PREOP. Thank you!

## 2024-04-13 ENCOUNTER — Encounter: Payer: Self-pay | Admitting: Cardiology

## 2024-04-15 NOTE — Telephone Encounter (Signed)
   Patient Name: Patrick Moyer  DOB: 12-11-1951 MRN: [id number]  Primary Cardiologist: Sonny Dust, MD (Inactive)  Chart reviewed as part of pre-operative protocol coverage. Given past medical history and time since last visit, based on ACC/AHA guidelines, Patrick Moyer is at acceptable risk for the planned procedure without further cardiovascular testing.  Patient was last seen in the office on 02/29/2024 and was doing well.  He is post watchman implant 12/2023.  Regarding Plavix  hold, per Dr. Marven Slimmer, "If elective, I would like to wait for Plavix  hold until 6 mo post implant.  Thanks, Donelda Fujita"  I will route this recommendation to the requesting party via Epic fax function and remove from pre-op pool.  Please call with questions.  Jude Norton, NP 04/15/2024, 3:09 PM

## 2024-04-16 ENCOUNTER — Ambulatory Visit (HOSPITAL_COMMUNITY)

## 2024-04-20 NOTE — Progress Notes (Unsigned)
  Electrophysiology Office Follow up Visit Note:    Date:  04/21/2024   ID:  Patrick Moyer, DOB 1951-11-27, MRN [id number]  PCP:  Jimmey Mould, MD  [facility] Cardiologist:  Sonny Dust, MD (Inactive)  [facility] Electrophysiologist:  Boyce Byes, MD    Interval History:     Patrick Moyer is a 73 y.o. adult who presents for a follow up visit.   The patient was last seen by Jeronimo Moors, PA-C on February 29, 2024.  He has a history of of atrial fibrillation ablation and watchman earlier this year.  The watchman was on January 17, 2024.  His catheter ablation was on October 19, 2023.  He was previously on Eliquis  5 mg by mouth twice daily but this has since been transitioned to Plavix  post watchman implant.  His medical history also includes Marfan's and ascending aortic aneurysm, hyperlipidemia, rheumatoid arthritis, GERD, hypertension.  He is doing okay today.  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  He is going to start antibiotics soon for that and will follow-up with his dentist about whether or not he needs a procedure to clear that up.      Past medical, surgical, social and family history were reviewed.  ROS:   Please see the history of present illness.    All other systems reviewed and are negative.  EKGs/Labs/Other Studies Reviewed:    The following studies were reviewed today:  April 05, 2024 CTA cardiac Appendage successfully closed with a 31 mm Watchman        Physical Exam:    VS:  BP 102/60   Pulse (!) 58   Ht 6\' 3"  (1.905 m)   Wt 194 lb (88 kg)   SpO2 98%   BMI 24.25 kg/m     Wt Readings from Last 3 Encounters:  04/21/24 194 lb (88 kg)  02/29/24 196 lb (88.9 kg)  02/25/24 193 lb 9.6 oz (87.8 kg)     GEN: no distress HEENT: Inflamed/swollen duct under the tongue on the left side. CARD: RRR, No MRG RESP: No IWOB. CTAB.      ASSESSMENT:    1. Paroxysmal atrial fibrillation (HCC)   2. Presence  of Watchman left atrial appendage closure device   3. Essential hypertension    PLAN:    In order of problems listed above:  #Atrial fibrillation #Watchman device in situ Doing well after catheter ablation and watchman plan.  Currently on Plavix  75 mg by mouth once daily.  Continue this until the 22-month mark after watchman implant.  #Hypertension At goal today.  Recommend checking blood pressures 1-2 times per week at home and recording the values.  Recommend bringing these recordings to the primary care physician. Continue Dyazide, metoprolol   Will refer to general cardiology, Dr. Rolm Clos, for routine cardiovascular care.   Follow-up 1 year with APP  Signed, Harvie Liner, MD, [facility], [facility] 04/21/2024 8:40 AM    Electrophysiology [facility]

## 2024-04-21 ENCOUNTER — Ambulatory Visit: Attending: Cardiology | Admitting: Cardiology

## 2024-04-21 ENCOUNTER — Encounter: Payer: Self-pay | Admitting: Cardiology

## 2024-04-21 VITALS — BP 102/60 | HR 58 | Ht 75.0 in | Wt 194.0 lb

## 2024-04-21 DIAGNOSIS — I10 Essential (primary) hypertension: Secondary | ICD-10-CM | POA: Diagnosis not present

## 2024-04-21 DIAGNOSIS — I480 Paroxysmal atrial fibrillation: Secondary | ICD-10-CM | POA: Diagnosis not present

## 2024-04-21 DIAGNOSIS — Z95818 Presence of other cardiac implants and grafts: Secondary | ICD-10-CM | POA: Diagnosis not present

## 2024-04-21 DIAGNOSIS — K115 Sialolithiasis: Secondary | ICD-10-CM | POA: Diagnosis not present

## 2024-04-21 NOTE — Patient Instructions (Signed)
 Medication Instructions:  Your physician recommends that you continue on your current medications as directed. Please refer to the Current Medication list given to you today.  *If you need a refill on your cardiac medications before your next appointment, please call your pharmacy*  Follow-Up: At [facility], you and your health needs are our priority.  As part of our continuing mission to provide you with exceptional heart care, our providers are all part of one team.  This team includes your primary Cardiologist (physician) and Advanced Practice Providers or APPs (Physician Assistants and Nurse Practitioners) who all work together to provide you with the care you need, when you need it.  Your next appointment:   1 year  Provider:   You will see one of the following Advanced Practice Providers on your designated Care Team:   Mertha Abrahams, Kennard Pea [address] [place], PA-C Suzann Riddle, NP Creighton Doffing, NP  You have been referred to see a General Cardiologist - Dr. Maryalice Smaller

## 2024-04-30 DIAGNOSIS — G4733 Obstructive sleep apnea (adult) (pediatric): Secondary | ICD-10-CM | POA: Diagnosis not present

## 2024-05-01 DIAGNOSIS — Z79899 Other long term (current) drug therapy: Secondary | ICD-10-CM | POA: Diagnosis not present

## 2024-05-01 DIAGNOSIS — M25552 Pain in left hip: Secondary | ICD-10-CM | POA: Diagnosis not present

## 2024-05-01 DIAGNOSIS — M0609 Rheumatoid arthritis without rheumatoid factor, multiple sites: Secondary | ICD-10-CM | POA: Diagnosis not present

## 2024-05-01 DIAGNOSIS — M1990 Unspecified osteoarthritis, unspecified site: Secondary | ICD-10-CM | POA: Diagnosis not present

## 2024-05-06 DIAGNOSIS — L270 Generalized skin eruption due to drugs and medicaments taken internally: Secondary | ICD-10-CM | POA: Diagnosis not present

## 2024-05-13 ENCOUNTER — Telehealth: Payer: Self-pay

## 2024-05-13 NOTE — Telephone Encounter (Signed)
 Copied from CRM [id number]. Topic: General - Call Back - No Documentation >> Apr 02, 2024  5:14 PM Patrick Moyer wrote: Reason for CRM: someone called patient and did not leave a detailed message. Please call patient back.  Completed was a result note

## 2024-06-05 ENCOUNTER — Other Ambulatory Visit: Payer: Self-pay

## 2024-06-05 ENCOUNTER — Ambulatory Visit: Admitting: Family Medicine

## 2024-06-05 VITALS — BP 122/60 | HR 63 | Ht 75.0 in | Wt 201.4 lb

## 2024-06-05 DIAGNOSIS — M25511 Pain in right shoulder: Secondary | ICD-10-CM

## 2024-06-05 DIAGNOSIS — G8929 Other chronic pain: Secondary | ICD-10-CM | POA: Diagnosis not present

## 2024-06-05 NOTE — Progress Notes (Signed)
   I, Patrick Moyer am a scribe for Dr. Garlan Juniper, MD.  Patrick Moyer is a 73 y.o. adult who presents to [facility] today for R shoulder pain. Pt was previously seen by Dr. Alease Hunter on 10/09/23 for L hip pain.  Today, pt c/o R shoulder pain x about a week this time. Patients states that he was doing weighted pull ups and overdid it in preparation for moving his things from one state to the next. Waking up in the morning with the pain. Pain located anterior and superior parts of his upper arm.   Radiates:no Aggravates: Treatments tried: voltaren  gel, celebrex , tylenol , methotrexate  Pertinent review of systems: No fevers or chills  Relevant historical information: Atrial fibrillation.   Exam:  BP 122/60   Pulse 63   Ht 6' 3 (1.905 m)   Wt 201 lb 6.4 oz (91.4 kg)   SpO2 97%   BMI 25.17 kg/m  General: Well Developed, well nourished, and in no acute distress.   MSK: Right shoulder normal-appearing normal motion pain with abduction.  Positive Hawkins and Neer's test. Negative Yergason's and speeds test.    Lab and Radiology Results  Procedure: Real-time Ultrasound Guided Injection of right shoulder subacromial bursa Device: Philips Affiniti 50G/GE Logiq Images permanently stored and available for review in PACS Ultrasound evaluation prior to injection reveals moderate subacromial bursitis.  No retracted rotator cuff tear is visible. Verbal informed consent obtained.  Discussed risks and benefits of procedure. Warned about infection, bleeding, hyperglycemia damage to structures among others. Patient expresses understanding and agreement Time-out conducted.   Noted no overlying erythema, induration, or other signs of local infection.   Skin prepped in a sterile fashion.   Local anesthesia: Topical Ethyl chloride.   With sterile technique and under real time ultrasound guidance: 40 mg of Kenalog and 2 mL of Marcaine  injected into subacromial  bursa. Fluid seen entering the bursa.   Completed without difficulty   Pain immediately resolved suggesting accurate placement of the medication.   Advised to call if fevers/chills, erythema, induration, drainage, or persistent bleeding.   Images permanently stored and available for review in the ultrasound unit.  Impression: Technically successful ultrasound guided injection.      Assessment and Plan: 74 y.o. 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  Plan for steroid injection today and home exercise program.  Recheck back as needed.   PDMP not reviewed this encounter. Orders Placed This Encounter  Procedures   US  LIMITED JOINT SPACE STRUCTURES UP RIGHT(NO LINKED CHARGES)    Reason for Exam (SYMPTOM  OR DIAGNOSIS REQUIRED):   right shoulder pain    Preferred imaging location?:   [facility] Shoulder Right    Standing Status:   Future    Expiration Date:   06/05/2025    Reason for Exam (SYMPTOM  OR DIAGNOSIS REQUIRED):   right shoulder pain    Preferred imaging location?:   [facility]   No orders of the defined types were placed in this encounter.    Discussed warning signs or symptoms. Please see discharge instructions. Patient expresses understanding.   The above documentation has been reviewed and is accurate and complete Garlan Juniper, M.D.

## 2024-06-05 NOTE — Patient Instructions (Addendum)
 Thank you for coming in today.  You received an injection today. Seek immediate medical attention if the joint becomes red, extremely painful, or is oozing fluid.  Please get an Xray today before you leave

## 2024-06-09 ENCOUNTER — Ambulatory Visit: Admitting: Nurse Practitioner

## 2024-06-09 ENCOUNTER — Telehealth: Payer: Self-pay | Admitting: Cardiovascular Disease

## 2024-06-09 NOTE — Telephone Encounter (Signed)
 Unable to leave voicemail.

## 2024-06-09 NOTE — Progress Notes (Deleted)
 Office Visit    Patient Name: Patrick Moyer Date of Encounter: 06/09/2024  Primary Care Provider:  Jimmey Mould, MD Primary Cardiologist:  Euell Herrlich, MD  Chief Complaint    73 year old adult with a history of nonobstructive CAD,  paroxysmal atrial fibrillation s/p Watchman device, dilation of the ascending aorta, hypertension, hyperlipidemia, rheumatoid arthritis, marfanoid habitus, BPH, OSA and GERD who presents for follow-up related to CAD and atrial fibrillation.   Past Medical History    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 73   per pt treated with INH for one year and cxr's since have been normal   HSV-1 infection    Marfanoid habitus    per pt has wing span is longer than his height, effects eyes (iris), hyperflexibility of ankle and hands,  AAA   Mild CAD    cardiologist--  dr Nicholette Barley---  CTA 10-22-2019   Non-celiac gluten sensitivity    Numbness and tingling in both hands    per pt intermittantly due to cervical spine   OSA on CPAP    auto 8-10,  pt stated uses every night   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)   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 Marven Slimmer   Pulmonary nodules    per pt followed by pcp (CTA and CT chest 10-22-2019 , stable)   Thoracic ascending aortic aneurysm [facility])    cardiologist--- dr Dawson Europe--- CTA  10-22-2019  ascending aorta 40mm and aortic root 43mm   Wears glasses    Past Surgical History:  Procedure Laterality Date   ATRIAL FIBRILLATION ABLATION N/A 10/19/2023   Procedure: ATRIAL FIBRILLATION ABLATION;  Surgeon: Boyce Byes, MD;  Location: [facility] INVASIVE CV LAB;  Service: Cardiovascular;  Laterality: N/A;   FINGER EXPLORATION Left 1995   left middle finger digital nerve repair   INGUINAL HERNIA REPAIR Left 1978   LAPAROSCOPIC APPENDECTOMY  2010   LEFT ATRIAL APPENDAGE OCCLUSION N/A 01/17/2024   Procedure: LEFT ATRIAL APPENDAGE OCCLUSION;  Surgeon: Boyce Byes, MD;  Location: [facility] INVASIVE CV LAB;  Service: Cardiovascular;  Laterality: N/A;   left knee arthroscopy      NASAL SEPTUM SURGERY  1975   TOTAL KNEE ARTHROPLASTY Left 08/29/2022   Procedure: UNICOMPARTMENTAL KNEE converted to a total knee;  Surgeon: Osa Blase, MD;  Location: [facility] ORS;  Service: Orthopedics;  Laterality: Left;  with block   TRANSESOPHAGEAL ECHOCARDIOGRAM (CATH LAB) N/A 01/17/2024   Procedure: TRANSESOPHAGEAL ECHOCARDIOGRAM;  Surgeon: Boyce Byes, MD;  Location: [facility] INVASIVE CV LAB;  Service: Cardiovascular;  Laterality: N/A;   TRANSURETHRAL RESECTION OF PROSTATE N/A 05/05/2020   Procedure: TRANSURETHRAL RESECTION OF THE PROSTATE (TURP), BIPOLAR;  Surgeon: Adelbert Homans, MD;  Location: [facility];  Service: Urology;  Laterality:  N/A;   WISDOM TOOTH EXTRACTION  1975    Allergies  Allergies  Allergen Reactions   Caffeine     Abdominal pain   Cialis [Tadalafil]     Caused acute angle glaucoma   Crestor  [Rosuvastatin ] Other (See Comments)    Causes joint aches, tolerates if not taking everyday   Oxycodone      Redness/ flushing to the face   Soy Protein [Soybean Oil]     Gastrointestinal upset   Viagra [Sildenafil]     Caused acute angle glaucoma   Latex Rash    rash   Tape Rash     Labs/Other Studies Reviewed    The following studies were reviewed  today:  Cardiac Studies & Procedures   ______________________________________________________________________________________________     ECHOCARDIOGRAM  ECHOCARDIOGRAM COMPLETE 12/13/2022  Narrative ECHOCARDIOGRAM REPORT    Patient Name:   Patrick Moyer Date of Exam: 12/13/2022 Medical Rec #:  [id number]      Height:       74.0 in Accession #:    [id number]     Weight:       204.6 lb Date of Birth:  February 27, 1951      BSA:          2.195 m Patient Age:    71 years       BP:           124/80 mmHg Patient Gender: M              HR:           62 bpm. Exam Location:  [address]  Procedure: 2D Echo, Cardiac Doppler, Color Doppler and Strain Analysis  Indications:    I48.91 Atrial fibrillation  History:        Patient has prior history of Echocardiogram examinations, most recent 09/10/2019. CAD, Arrythmias:Atrial Fibrillation, Signs/Symptoms:Dilated aortic root; Risk Factors:Palpitations and Dyslipidemia.  Sonographer:    Lula Sale RDCS Referring Phys: [id number] MICHELE M LENZE  IMPRESSIONS   1. Left ventricular ejection fraction, by estimation, is 60 to 65%. The left ventricle has normal function. The left ventricle has no regional wall motion abnormalities. There is mild concentric left ventricular hypertrophy. Left ventricular diastolic parameters are consistent with Grade I diastolic dysfunction (impaired relaxation). Elevated left atrial pressure. The average left ventricular global longitudinal strain is -22.6 %. The global longitudinal strain is normal. 2. Right ventricular systolic function is normal. The right ventricular size is normal. There is normal pulmonary artery systolic pressure. 3. Left atrial size was mildly dilated. 4. The mitral valve is normal in structure. Mild to moderate mitral valve regurgitation. 5. The aortic valve is tricuspid. Aortic valve regurgitation is not visualized. No aortic stenosis is present. 6. Aortic dilatation noted. There is  moderate dilatation of the aortic root, measuring 45 mm. There is mild dilatation of the ascending aorta, measuring 39 mm. 7. The inferior vena cava is normal in size with greater than 50% respiratory variability, suggesting right atrial pressure of 3 mmHg.  Comparison(s): No significant change from prior study. Prior images reviewed side by side.  FINDINGS Left Ventricle: Left ventricular ejection fraction, by estimation, is 60 to 65%. The left ventricle has normal function. The left ventricle has no regional wall motion abnormalities. The average left ventricular global longitudinal strain is -22.6 %. The global longitudinal strain is normal. The left ventricular internal cavity size was normal in size. There is mild concentric left ventricular hypertrophy. Left ventricular diastolic parameters are consistent with Grade I  diastolic dysfunction (impaired relaxation). Elevated left atrial pressure.  Right Ventricle: The right ventricular size is normal. No increase in right ventricular wall thickness. Right ventricular systolic function is normal. There is normal pulmonary artery systolic pressure. The tricuspid regurgitant velocity is 2.53 m/s, and with an assumed right atrial pressure of 3 mmHg, the estimated right ventricular systolic pressure is 28.6 mmHg.  Left Atrium: Left atrial size was mildly dilated.  Right Atrium: Right atrial size was normal in size.  Pericardium: There is no evidence of pericardial effusion.  Mitral Valve: The mitral valve is normal in structure. Mild to moderate mitral valve regurgitation, with centrally-directed jet.  Tricuspid Valve: The tricuspid valve is normal in structure. Tricuspid valve regurgitation is mild.  Aortic Valve: The aortic valve is tricuspid. Aortic valve regurgitation is not visualized. No aortic stenosis is present.  Pulmonic Valve: The pulmonic valve was grossly normal. Pulmonic valve regurgitation is trivial.  Aorta: Aortic dilatation  noted. There is moderate dilatation of the aortic root, measuring 45 mm. There is mild dilatation of the ascending aorta, measuring 39 mm.  Venous: The inferior vena cava is normal in size with greater than 50% respiratory variability, suggesting right atrial pressure of 3 mmHg.  IAS/Shunts: No atrial level shunt detected by color flow Doppler.   LEFT VENTRICLE PLAX 2D LVIDd:         4.60 cm   Diastology LVIDs:         3.10 cm   LV e' medial:    7.94 cm/s LV PW:         1.20 cm   LV E/e' medial:  11.0 LV IVS:        1.60 cm   LV e' lateral:   11.30 cm/s LVOT diam:     2.20 cm   LV E/e' lateral: 7.7 LV SV:         107 LV SV Index:   49        2D Longitudinal Strain LVOT Area:     3.80 cm  2D Strain GLS Avg:     -22.6 %   RIGHT VENTRICLE             IVC RV S prime:     18.60 cm/s  IVC diam: 1.60 cm TAPSE (M-mode): 2.8 cm RVSP:           28.6 mmHg  LEFT ATRIUM           Index        RIGHT ATRIUM           Index LA diam:      3.90 cm 1.78 cm/m   RA Pressure: 3.00 mmHg LA Vol (A4C): 43.2 ml 19.68 ml/m  RA Area:     17.70 cm RA Volume:   51.80 ml  23.60 ml/m AORTIC VALVE LVOT Vmax:   129.00 cm/s LVOT Vmean:  87.600 cm/s LVOT VTI:    0.281 m  AORTA Ao Root diam: 4.50 cm Ao Asc diam:  3.90 cm  MITRAL VALVE                TRICUSPID VALVE MV Area (PHT): 2.25 cm     TR Peak grad:   25.6 mmHg MV Decel Time: 337 msec     TR Vmax:        253.00 cm/s MV E velocity: 87.50 cm/s   Estimated RAP:  3.00 mmHg MV A velocity: 102.00 cm/s  RVSP:  28.6 mmHg MV E/A ratio:  0.86 SHUNTS Systemic VTI:  0.28 m Systemic Diam: 2.20 cm  Luana Rumple MD Electronically signed by Luana Rumple MD Signature Date/Time: 12/13/2022/4:17:14 PM    Final   TEE  ECHO TEE 01/17/2024  Narrative TRANSESOPHOGEAL ECHO REPORT    Patient Name:   BART ASHFORD Date of Exam: 01/17/2024 Medical Rec #:  [id number]      Height:       75.0 in Accession #:    [id number]     Weight:        195.0 lb Date of Birth:  March 29, 1951      BSA:          2.171 m Patient Age:    72 years       BP:           124/68 mmHg Patient Gender: M              HR:           66 bpm. Exam Location:  Inpatient  Procedure: Transesophageal Echo, 3D Echo, Color Doppler and Cardiac Doppler  Indications:     I48.2 Chronic atrial fibrillation  History:         Patient has prior history of Echocardiogram examinations, most recent 12/13/2022. Risk Factors:Sleep Apnea and Dyslipidemia.  Sonographer:     Sherline Distel Senior RDCS Referring Phys:  [id number] Boyce Byes Diagnosing Phys: Gloriann Larger MD   Sonographer Comments: 31mm Watchman FLX implanted   PROCEDURE: After discussion of the risks and benefits of a TEE, an informed consent was obtained from the patient. The transesophogeal probe was passed without difficulty through the esophogus of the patient. Sedation performed by different physician. The patient was monitored while under deep sedation. The patient developed no complications during the procedure.  IMPRESSIONS   1. Prior to procedure, patent left atrial appendage. Predominantly windsock morphologh with small wing/lobe. Maximal 2D diameter 2.76 cm. Maximal 3D diameter 32 mm. During procedure a mid-mid transeptal puncture was performed. a 31 mm Watchman FLX pro was placed, recaptured for optimization and deployed. After procedure a small mitral shoulder was present, best seen at the 135 view. A small gutter ( ~ 3mm) adjacent to the limbus was noted, best seen at the 0 view. There was no communication/leak around the base of the device. Average compression ~ 16%. Successful Watchman placement. PFO noted but no iatrogenic ASD from prior ablation. Post procedure left to right shunt from transeptal puncture. No pericardial effusion through procedure. . Left atrial size was mildly dilated. No left atrial/left atrial appendage thrombus was detected. 2. Left ventricular ejection fraction, by  estimation, is 60 to 65%. The left ventricle has normal function. 3. Right ventricular systolic function is normal. The right ventricular size is normal. 4. The mitral valve is normal in structure. Mild mitral valve regurgitation. No evidence of mitral stenosis. 5. The aortic valve is tricuspid. Aortic valve regurgitation is not visualized. No aortic stenosis is present. 6. Aortic dilatation noted. There is mild dilatation of the aortic root, measuring 43 mm. There is mild dilatation of the ascending aorta, measuring 40 mm. 7. Evidence of atrial level shunting detected by color flow Doppler.  FINDINGS Left Ventricle: Left ventricular ejection fraction, by estimation, is 60 to 65%. The left ventricle has normal function. The left ventricular internal cavity size was normal in size.  Right Ventricle: The right ventricular size is normal. No increase in right ventricular wall thickness. Right ventricular systolic function is  normal.  Left Atrium: Prior to procedure, patent left atrial appendage. Predominantly windsock morphologh with small wing/lobe. Maximal 2D diameter 2.76 cm. Maximal 3D diameter 32 mm. During procedure a mid-mid transeptal puncture was performed. a 31 mm Watchman FLX pro was placed, recaptured for optimization and deployed. After procedure a small mitral shoulder was present, best seen at the 135 view. A small gutter ( ~ 3mm) adjacent to the limbus was noted, best seen at the 0 view. There was no communication/leak around the base of the device. Average compression ~ 16%. Successful Watchman placement. PFO noted but no iatrogenic ASD from prior ablation. Post procedure left to right shunt from transeptal puncture. No pericardial effusion through procedure. Left atrial size was mildly dilated. No left atrial/left atrial appendage thrombus was detected.  Right Atrium: Right atrial size was normal in size.  Pericardium: There is no evidence of pericardial effusion.  Mitral  Valve: The mitral valve is normal in structure. Mild mitral valve regurgitation. No evidence of mitral valve stenosis.  Tricuspid Valve: The tricuspid valve is normal in structure. Tricuspid valve regurgitation is trivial. No evidence of tricuspid stenosis.  Aortic Valve: The aortic valve is tricuspid. Aortic valve regurgitation is not visualized. No aortic stenosis is present.  Pulmonic Valve: The pulmonic valve was normal in structure. Pulmonic valve regurgitation is not visualized.  Aorta: Aortic dilatation noted. There is mild dilatation of the aortic root, measuring 43 mm. There is mild dilatation of the ascending aorta, measuring 40 mm.  IAS/Shunts: Evidence of atrial level shunting detected by color flow Doppler.  Additional Comments: Spectral Doppler performed.  Gloriann Larger MD Electronically signed by Gloriann Larger MD Signature Date/Time: 01/17/2024/9:02:58 AM    Final  MONITORS  LONG TERM MONITOR (3-14 DAYS) 12/13/2022  Narrative   Patch wear time was 13 days and 23 hours   Predominant rhythm was NSR with average HR 73bpm   There was a 7% burden of Afib with longest lasting 5h and   Rare SVE (<1%), rare VE (<1%)   No pauses   Patient triggered events mainly correlated with AFib   Patch Wear Time:  13 days and 23 hours (2023-11-29T13:07:47-0500 to 2023-12-13T12:21:42-0500)  Patient had a min HR of 47 bpm, max HR of 188 bpm, and avg HR of 73 bpm. Predominant underlying rhythm was Sinus Rhythm. Atrial Fibrillation occurred (7% burden), ranging from 61-188 bpm (avg of 104 bpm), the longest lasting 5 hours 47 mins with an avg rate of 88 bpm. Atrial Fibrillation was detected within +/- 45 seconds of symptomatic patient event(s). Isolated SVEs were rare (<1.0%), SVE Couplets were rare (<1.0%), and SVE Triplets were rare (<1.0%). Isolated VEs were rare (<1.0%), VE Couplets were rare (<1.0%), and no VE Triplets were present. Ventricular Bigeminy was  present.  Riccardo Chamberlain, MD   CT SCANS  CT CORONARY MORPH W/CTA COR W/SCORE 10/22/2019  Addendum 10/23/2019  4:02 PM ADDENDUM REPORT: 10/23/2019 16:00  CLINICAL DATA:  73 year old male with h/o ascending aortic aneurysm in the settings of marfanoid body habitus and family history of Marfan syndrome. He is now also experiencing chest pain.  EXAM: Cardiac/Coronary  CTA  TECHNIQUE: The patient was scanned on a [employer].  COMPARISON:  Chest CTA on May 07, 2018  FINDINGS: A 100 kV prospective scan was triggered in the descending thoracic aorta at 111 HU's. Axial non-contrast 3 mm slices were carried out through the heart. The data set was analyzed on a dedicated work station and scored using the  Agatson method. Gantry rotation speed was 250 msecs and collimation was .6 mm. No beta blockade and 0.8 mg of sl NTG was given. The 3D data set was reconstructed in 5% intervals of the 67-82 % of the R-R cycle. Diastolic phases were analyzed on a dedicated work station using MPR, MIP and VRT modes. The patient received 80 cc of contrast.  Aorta: Mildly dilated aortic root at the sinus level with maximum diameter 43 mm, upper normal size of the ascending aorta measuring 40 mm. No calcifications or atherosclerotic plaque and no dissection.  Sinotubular Junction: 37 x 34 mm  Ascending Thoracic Aorta: 40 x 40 mm  Aortic Arch: Not visualized  Descending Thoracic Aorta: 28 x 28 mm  Sinus of Valsalva Measurements:  Non-coronary: 43 mm  Right -coronary: 40 mm  Left -coronary: 43 mm  Aortic Valve:  Trileaflet.  Trivial calcifications.  Coronary Arteries:  Normal coronary origin.  Right dominance.  RCA is a large dominant artery that gives rise to PDA and PLA. There is no plaque.  Left main is a large artery that gives rise to LAD and LCX arteries. Left main has no plaque.  LAD is a large vessel that gives rise to one large diagonal artery. There is minimal  calcified plaque in the proximal LAD with stenosis 0-25%.  D1 is a large branch with no plaque.  LCX is a non-dominant artery that gives rise to two OM branches. There is no plaque.  Other findings:  Normal pulmonary vein drainage into the left atrium.  Normal left atrial appendage without a thrombus.  Normal size of the pulmonary artery.  IMPRESSION: 1. Coronary calcium  score of 57. This was 46 percentile for age and sex matched control.  2. Normal coronary origin with right dominance.  3. CAD-RADS 1. Minimal non-obstructive CAD (0-24%) in the proximal LAD. Consider non-atherosclerotic causes of chest pain. Consider preventive therapy and risk factor modification.  4. Mildly dilated aortic root at the sinus level with maximum diameter 43 mm, upper normal size of the ascending aorta measuring 40 mm. When compared to the prior study from 05/07/2018 there has been no change.   Electronically Signed By: Christoper Crafts On: 10/23/2019 16:00  Narrative EXAM: OVER-READ INTERPRETATION  CT CHEST  The following report is an over-read performed by radiologist Dr. Erica Hau of [facility] on 10/22/2019. This over-read does not include interpretation of cardiac or coronary anatomy or pathology. The coronary CTA interpretation by the cardiologist is attached.  COMPARISON:  None.  FINDINGS: Vascular: Top-normal caliber of the ascending thoracic aorta measuring approximately 3.9 cm.  Mediastinum/Nodes: Calcified bilateral hilar and subcarinal lymph nodes are consistent with prior granulomatous disease.  Lungs/Pleura: 5 mm calcified granuloma of the central right lower lobe. There is no evidence of pulmonary edema, consolidation, pneumothorax or pleural fluid.  Upper Abdomen: No acute abnormality.  Musculoskeletal: No chest wall mass or suspicious bone lesions identified.  IMPRESSION: 1. Top-normal caliber of the ascending thoracic aorta  measuring approximately 3.9 cm. 2. Evidence of prior granulomatous disease with calcified bilateral hilar and subcarinal lymph nodes and central right lower lobe calcified granuloma.  Electronically Signed: By: Erica Hau M.D. On: 10/22/2019 15:34     ______________________________________________________________________________________________     Recent Labs: 01/29/2024: BUN 22; Creatinine, Ser 0.71; Hemoglobin 14.4; Platelets 209; Potassium 4.0; Sodium 140  Recent Lipid Panel    Component Value Date/Time   CHOL 132 01/04/2024 1123   TRIG 66 01/04/2024 1123   HDL 42  01/04/2024 1123   CHOLHDL 3.1 01/04/2024 1123   LDLCALC 76 01/04/2024 1123    History of Present Illness    72 year old adult with the above past medical history including nonobstructive CAD,  paroxysmal atrial fibrillation s/p Watchman device, dilation of the ascending aorta, hypertension, hyperlipidemia, rheumatoid arthritis, marfanoid habitus, BPH, OSA and GERD.  Coronary CT angiogram in 2020 revealed coronary calcium  score 57 (41st percentile), minimal nonobstructive CAD.  Echocardiogram in 2023 showed EF 60 to 65%, normal LV function, no RWMA, mild concentric LVH, G1 DD, normal RV systolic function, mild to moderate mitral valve regurgitation, moderate dilation of the aortic root measuring 45 mm, mild elation of ascending order measuring 39 mm.  Cardiac monitor in 10/2022 revealed predominantly sinus rhythm, 7% A-fib burden.  They underwent A-fib ablation in 09/2023 by Dr. Marven Slimmer. Previously on Eliquis , however, not felt to be a long-term candidate for anticoagulation due to concern for increased bleeding risk in the setting of chronic NSAID use (daily Celebrex ).  They underwent successful watchman implant in 12/2023.  Intraoperative TEE showed EF 60 to 65%, normal LV function, no RWMA, normal RV systolic function, mild mitral valve regurgitation, mild dilation of the ascending aorta measuring millimeters, aortic  root dilation measuring 43 mm. Cardiac CT prior to watchman revealed coronary calcium  score 140 (47 percentile), nonobstructive CAD.  They were evaluated in the ED in February 2025 in the setting of noncardiac chest pain.  Postoperative CT in 03/2024 revealed LAA without device leak or device related thrombus, small PFO, mildly dilated ascending aorta measuring up to 40 mm.  Her last seen in the office on 04/21/2024 and were stable from a cardiac standpoint.  Plavix  therapy was recommended x 6 months post watchman implant.  They present today for follow-up.  Since their last visit  Nonobstructive CAD: Paroxysmal atrial fibrillation: Ascending aorta dilation: Hypertension: Hyperlipidemia: Rheumatoid arthritis: OSA: Disposition:   Home Medications    Current Outpatient Medications  Medication Sig Dispense Refill   acetaminophen  (TYLENOL ) 500 MG tablet Take 1,000 mg by mouth 2 (two) times daily.     amoxicillin  (AMOXIL ) 500 MG tablet Take 4 tablets (2,000 mg total) by mouth as directed. 1 hour prior to dental work including cleanings 12 tablet 12   ascorbic acid (VITAMIN C) 500 MG tablet Take 500 mg by mouth 2 (two) times daily.     B Complex Vitamins (B COMPLEX 100 PO) Take 1 tablet by mouth in the morning and at bedtime.     Ca Carbonate-Mag Hydroxide (ROLAIDS PO) Take 1 tablet by mouth 2 (two) times daily.     celecoxib  (CELEBREX ) 200 MG capsule Take 200 mg by mouth 2 (two) times daily.     cholecalciferol (VITAMIN D) 1000 UNITS tablet Take 1,000 Units by mouth daily.     clopidogrel  (PLAVIX ) 75 MG tablet Take 1 tablet (75 mg total) by mouth daily. 90 tablet 1   Coenzyme Q10 (COQ-10) 200 MG CAPS Take 200 mg by mouth every Monday, Wednesday, and Friday.     COLLAGEN PO Take 3 tablets by mouth 2 (two) times daily.     diclofenac  Sodium (VOLTAREN ) 1 % GEL Apply 4 g topically 4 (four) times daily. 100 g 0   ELDERBERRY PO Take 1 capsule by mouth daily.     Emollient (BIO-OIL EX) Apply 1  Application topically 2 (two) times daily. Apply to left knee     fexofenadine (ALLEGRA) 180 MG tablet Take 180 mg by mouth daily.  FIBER PO Take 2 tablets by mouth daily.     fluticasone  (FLONASE ) 50 MCG/ACT nasal spray Place 3-4 sprays into both nostrils in the morning and at bedtime.     folic acid (FOLVITE) 1 MG tablet Take 1 mg by mouth daily.     gabapentin  (NEURONTIN ) 300 MG capsule Take 150 mg by mouth at bedtime. Take 1/2 tab at bedtime     Glucosamine HCl (GLUCOSAMINE PO) Take 1 tablet by mouth at bedtime.     Guaifenesin (MUCINEX MAXIMUM STRENGTH) 1200 MG TB12 Take 1,200 mg by mouth daily.     Hypromellose (GENTEAL OP) Place 1 drop into both eyes at bedtime.     L-Lysine 500 MG CAPS Take 500-1,000 mg by mouth See admin instructions. Take 1000 mg in the morning and 500 mg at night     Lutein -Zeaxanthin 25-5 MG CAPS Take 1 tablet by mouth daily. 90 capsule 0   Magnesium  400 MG TABS Take 400 mg by mouth at bedtime.     methotrexate (RHEUMATREX) 2.5 MG tablet Take 12.5 mg by mouth once a week. Caution:Chemotherapy. Protect from light.     metoprolol  tartrate (LOPRESSOR ) 25 MG tablet Take 1 tablet (25 mg total) by mouth 2 (two) times daily. (Patient taking differently: Take 25 mg by mouth See admin instructions.  may take a 25 mg dose as needed for palpitations- pt is taking BID) 180 tablet 1   montelukast  (SINGULAIR ) 10 MG tablet Take 10 mg by mouth at bedtime.     Omega-3 Fatty Acids (FISH OIL ) 1000 MG CAPS Take 1,000 mg by mouth at bedtime.     OVER THE COUNTER MEDICATION Take 1 tablet by mouth 2 (two) times daily. Ju[employer] Heart, Brain, and Cellular health supplement     OVER THE COUNTER MEDICATION Take 3 tablets by mouth daily. Juvenon BloodFlow-7 supplement     pantoprazole  (PROTONIX ) 40 MG tablet TAKE 1 TABLET BY MOUTH ONCE DAILY *DO NOT CRUSH OR CHEW* 90 tablet 0   RESTASIS 0.05 % ophthalmic emulsion Place 1 drop into both eyes 2 (two) times daily.     rosuvastatin  (CRESTOR ) 5 MG  tablet Take 1 tablet (5 mg total) by mouth 3 (three) times a week. 45 tablet 3   SODIUM FLUORIDE 5000 PPM 1.1 % GEL dental gel Take 1 application  by mouth 2 (two) times daily.     TART CHERRY PO Take 3,000 mg by mouth every evening.     tirzepatide (MOUNJARO) 2.5 MG/0.5ML Pen Inject 2.5 mg into the skin 8 days. Every eight (8) days     triamterene-hydrochlorothiazide (DYAZIDE) 37.5-25 MG capsule Take 1 capsule by mouth daily.     Turmeric 500 MG CAPS Take 500 mg by mouth 2 (two) times daily.     valACYclovir (VALTREX) 1000 MG tablet Take 2,000 mg by mouth 2 (two) times daily as needed (cold sore).     Xylitol (XYLIMELTS MT) Use as directed 1 tablet in the mouth or throat at bedtime.     No current facility-administered medications for this visit.     Review of Systems    ***.  All other systems reviewed and are otherwise negative except as noted above.    Physical Exam    VS:  There were no vitals taken for this visit. , BMI There is no height or weight on file to calculate BMI.     GEN: Well nourished, well developed, in no acute distress. HEENT: normal. Neck: Supple, no JVD, carotid bruits,  or masses. Cardiac: RRR, no murmurs, rubs, or gallops. No clubbing, cyanosis, edema.  Radials/DP/PT 2+ and equal bilaterally.  Respiratory:  Respirations regular and unlabored, clear to auscultation bilaterally. GI: Soft, nontender, nondistended, BS + x 4. MS: no deformity or atrophy. Skin: warm and dry, no rash. Neuro:  Strength and sensation are intact. Psych: Normal affect.  Accessory Clinical Findings    ECG personally reviewed by me today -    - no acute changes.   Lab Results  Component Value Date   WBC 7.8 01/29/2024   HGB 14.4 01/29/2024   HCT 41.2 01/29/2024   MCV 92.4 01/29/2024   PLT 209 01/29/2024   Lab Results  Component Value Date   CREATININE 0.71 01/29/2024   BUN 22 01/29/2024   NA 140 01/29/2024   K 4.0 01/29/2024   CL 106 01/29/2024   CO2 27 01/29/2024    Lab Results  Component Value Date   ALT 20 11/22/2022   AST 21 11/22/2022   ALKPHOS 75 11/22/2022   BILITOT 0.4 11/22/2022   Lab Results  Component Value Date   CHOL 132 01/04/2024   HDL 42 01/04/2024   LDLCALC 76 01/04/2024   TRIG 66 01/04/2024   CHOLHDL 3.1 01/04/2024    No results found for: HGBA1C  Assessment & Plan    1.  ***  No BP recorded.  {Refresh Note OR Click here to enter BP  :1}***   Jude Norton, NP 06/09/2024, 6:33 AM

## 2024-06-09 NOTE — Telephone Encounter (Signed)
 Spoke with pt regarding his questions. Pt stated he has been taking collagen for 20 years to strengthen his aorta and is wondering if it is necessary to continue taking it. Pt also missed his appointment with Marlana Silvan, NP today 6/16 and would like to know if he can reschedule as a virtual appointment. The pt was told his questions would be forwarded to Marlana Silvan, NP and her covering for suggestions. Pt verbalized understanding. All questions if any were answered.

## 2024-06-09 NOTE — Telephone Encounter (Signed)
 Pt c/o medication issue:  1. Name of Medication: Collagen - Pure Nature  2. How are you currently taking this medication (dosage and times per day)? 3 pills in the morning  3. Are you having a reaction (difficulty breathing--STAT)? No   4. What is your medication issue? Patient states he switched the brand of his collagen to pure nature and has seen a increase in toenail and hair growth. He is wanting to know if there is any other recommendations that can be given. He also is wanting to know if the f/u missed today can be rs as virtual. Please advise.

## 2024-06-19 NOTE — Telephone Encounter (Signed)
 Spoke with pt. Pt was advised to speak with his PCP regarding  collagen supplementation. Pt was advised to schedule an apt with Dr. Barbaraann or and APP if needed sooner. Pt states he isn't new to cardiology and his providers have left heart care. Pt states that's ok and he won't bother and hung the phone up before I could schedule an apt.

## 2024-07-07 ENCOUNTER — Encounter: Payer: Self-pay | Admitting: Cardiology

## 2024-07-11 ENCOUNTER — Ambulatory Visit: Payer: Medicare PPO

## 2024-07-22 ENCOUNTER — Telehealth: Payer: Self-pay

## 2024-07-22 NOTE — Telephone Encounter (Signed)
 Called to check in with patient, who had LAAO on 01/17/2024. Instructed him to STOP PLAVIX . He stated he lost his Plavix  2 weeks ago, took ASA 325 mg daily for about a week or so, and now takes ASA 81 mg daily. Scheduled the patient with Dr. Barbaraann 09/25/2024 to establish with Cardiology. The patient understood Dr. Barbaraann will guide whether or not to continue ASA. The patient understands to call with questions or concerns.  He was grateful for call and agreed with plan.

## 2024-08-27 ENCOUNTER — Ambulatory Visit: Admitting: Neurology

## 2024-09-05 ENCOUNTER — Telehealth: Payer: Self-pay

## 2024-09-05 NOTE — Telephone Encounter (Signed)
 Pt has IN OFFICE appt 09/25/24 with Darryle Decent, MD. Preop clearance will be addressed at that time.  Will update the surgeons office

## 2024-09-05 NOTE — Telephone Encounter (Signed)
   Name: CYNCERE SONTAG  DOB: 1951-11-18  MRN: [id number]  Primary Cardiologist: Soyla DELENA Merck, MD  Chart reviewed as part of pre-operative protocol coverage. The patient has an upcoming visit scheduled with Dr. Barbaraann on 10/02/2024 at which time clearance can be addressed in case there are any issues that would impact surgical recommendations.   EGD Is not scheduled until 10/02/2024 as below. I added preop FYI to appointment note so that provider is aware to address at time of outpatient visit.  Per office protocol the cardiology provider should forward their finalized clearance decision and recommendations regarding antiplatelet therapy to the requesting party below.    I will route this message as FYI to requesting party and remove this message from the preop box as separate preop APP input not needed at this time.   Please call with any questions.  Lamarr Satterfield, NP  09/05/2024, 3:12 PM

## 2024-09-05 NOTE — Telephone Encounter (Signed)
   Pre-operative Risk Assessment    Patient Name: Patrick Moyer  DOB: 01-21-1951 MRN: [id number]   Date of last office visit: 04/21/24 OLE HOLTS, MD Date of next office visit: 09/25/24 DARRYLE DECENT, MD   Request for Surgical Clearance    Procedure:  EGD  Date of Surgery:  Clearance 10/02/24                                Surgeon:  DR ROLLIN Surgeon's Group or Practice Name:  [facility], [place] Phone number:  [phone] Fax number:  [phone]   Type of Clearance Requested:   - Medical  - Pharmacy:  Hold Aspirin      Type of Anesthesia:  PROPOFOL    Additional requests/questions:    SignedLucie DELENA Ku   09/05/2024, 2:54 PM

## 2024-09-25 ENCOUNTER — Encounter: Payer: Self-pay | Admitting: Cardiovascular Disease

## 2024-09-25 ENCOUNTER — Ambulatory Visit: Attending: Cardiovascular Disease | Admitting: Cardiovascular Disease

## 2024-09-25 VITALS — BP 129/76 | HR 60 | Ht 75.0 in | Wt 200.0 lb

## 2024-09-25 DIAGNOSIS — Z95818 Presence of other cardiac implants and grafts: Secondary | ICD-10-CM | POA: Diagnosis not present

## 2024-09-25 DIAGNOSIS — I7121 Aneurysm of the ascending aorta, without rupture: Secondary | ICD-10-CM

## 2024-09-25 DIAGNOSIS — I10 Essential (primary) hypertension: Secondary | ICD-10-CM

## 2024-09-25 DIAGNOSIS — I480 Paroxysmal atrial fibrillation: Secondary | ICD-10-CM | POA: Diagnosis not present

## 2024-09-25 DIAGNOSIS — E7800 Pure hypercholesterolemia, unspecified: Secondary | ICD-10-CM

## 2024-09-25 NOTE — Patient Instructions (Addendum)
 Medication Instructions:  No changes  *If you need a refill on your cardiac medications before your next appointment, please call your pharmacy*   Lab Work: Not needed now  Will need BMP prior to CT chest or aorta - gated    Testing/Procedures: Your physician has requested that you have cardiac CT. Cardiac computed tomography (CT) is a painless test that uses an x-ray machine to take clear, detailed pictures of your heart and aorta. Yo will need to have a BMP lab prior to the test      Follow-Up: At [facility], you and your health needs are our priority.  As part of our continuing mission to provide you with exceptional heart care, we have created designated Provider Care Teams.  These Care Teams include your primary Cardiologist (physician) and Advanced Practice Providers (APPs -  Physician Assistants and Nurse Practitioners) who all work together to provide you with the care you need, when you need it.     Your next appointment:   12 month(s)  The format for your next appointment:   In Person  Provider:   Darryle ONEIDA Decent, MD   Other Instructions    Dr Decent will send you some genetic testing  information in the mail

## 2024-09-25 NOTE — Progress Notes (Signed)
 Cardiology Office Note:  .   Date:  09/25/2024  ID:  [name], DOB Apr 25, 1951, MRN [id number] PCP: Barbaraann Darryle Ned, MD  [facility] Providers Cardiologist:  Darryle ONEIDA Barbaraann, MD Electrophysiologist:  OLE ONEIDA HOLTS, MD {  History of Present Illness: .    Chief Complaint  Patient presents with   Atrial Fibrillation         Patrick Moyer is a 73 y.o. adult with history of CAD, pAF, Aortic dilation who presents for the evaluation of preoperative assessment at the request of O'Neal, Darryle Ned, MD.  Father aortic dissection Brother aortic aneurysm  Pectus excavatum in son  Discussed the use of AI scribe software for clinical note transcription with the patient, who gave verbal consent to proceed.  History of Present Illness   Patrick Moyer is a 73 year old with paroxysmal atrial fibrillation and ascending aortic dilation who presents for routine cardiovascular care. They were previously seen by Dr. HOLTS for AFib management.  They have a history of paroxysmal atrial fibrillation and have undergone an AFib ablation and left atrial appendage closure with a Watchman device. They monitor their atrial fibrillation with a watch, noting it is less than 2%. They are currently off anticoagulation and have undergone a Watchman procedure. They are taking metoprolol  tartrate 25 mg twice daily and monitor their atrial fibrillation with a watch, noting it is less than 2%.  They have a history of ascending aortic dilation, with recent chest CT showing a measurement of 41 mm. There is a strong family history of aortic aneurysms and dissections, with their father having died from an aortic dissection and other relatives having similar issues. They have not undergone genetic testing for connective tissue disorders.  Hypertension and hyperlipidemia are part of their medical history. They are on rosuvastatin  5 mg three times a week, with an LDL of 76. Their coronary calcium   score is 140, placing them in the 47th percentile. No history of heart attack or stroke is reported.  They have arthritis, for which they take methotrexate and Celebrex . They report that when they are on methotrexate, they do not have problems with their arthritis. They have considered hip replacement surgery but have not pursued it due to symptom control with medication.  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 They are married with two children and plan to spend part of their retirement in [place], [place]. They do not smoke, consume alcohol, or use recreational drugs. They describe themselves as active, having recently been involved in building a new house.          Problem List Paroxysmal Afib -PVI 10/19/2023 -s/p watchman FLX 31 mm 01/17/2024 2. Coronary calcium  -CAC 140 (47th percentile) 3. Ascending aortic aneurysm  -41 mm 04/05/2024 4. HLD -T chol 132, HDL 42, LDL 76, TG  5. HTN 6. Marfans?    ROS: All other ROS reviewed and negative. Pertinent positives noted in the HPI.     Studies Reviewed: SABRA   EKG Interpretation Date/Time:  Thursday September 25 2024 14:02:05 EDT Ventricular Rate:  53 PR Interval:  202 QRS Duration:  92 QT Interval:  414 QTC Calculation: 388 R Axis:   46  Text Interpretation: Sinus bradycardia Minimal voltage criteria for LVH, may be normal variant ( Sokolow-Lyon Confirmed by Barbaraann Darryle [phone]) on 09/25/2024 2:11:31 PM   TTE 12/13/2022  1. Left ventricular  ejection fraction, by estimation, is 60 to 65%. The  left ventricle has normal function. The left ventricle has no regional  wall motion abnormalities. There is mild concentric left ventricular  hypertrophy. Left ventricular diastolic  parameters are consistent with Grade I diastolic dysfunction (impaired  relaxation). Elevated left atrial pressure. The average left ventricular   global longitudinal strain is -22.6 %. The global longitudinal strain is  normal.   2. Right ventricular systolic function is normal. The right ventricular  size is normal. There is normal pulmonary artery systolic pressure.   3. Left atrial size was mildly dilated.   4. The mitral valve is normal in structure. Mild to moderate mitral valve  regurgitation.   5. The aortic valve is tricuspid. Aortic valve regurgitation is not  visualized. No aortic stenosis is present.   6. Aortic dilatation noted. There is moderate dilatation of the aortic  root, measuring 45 mm. There is mild dilatation of the ascending aorta,  measuring 39 mm.   7. The inferior vena cava is normal in size with greater than 50%  respiratory variability, suggesting right atrial pressure of 3 mmHg.  Physical Exam:   VS:  BP 129/76 (BP Location: Left Arm, Patient Position: Sitting, Cuff Size: Normal)   Pulse 60   Ht 6' 3 (1.905 m)   Wt 200 lb (90.7 kg)   SpO2 96%   BMI 25.00 kg/m    Wt Readings from Last 3 Encounters:  09/25/24 200 lb (90.7 kg)  06/05/24 201 lb 6.4 oz (91.4 kg)  04/21/24 194 lb (88 kg)    GEN: Well nourished, well developed in no acute distress NECK: No JVD; No carotid bruits CARDIAC: RRR, no murmurs, rubs, gallops RESPIRATORY:  Clear to auscultation without rales, wheezing or rhonchi  ABDOMEN: Soft, non-tender, non-distended EXTREMITIES:  No edema; No deformity  ASSESSMENT AND PLAN: .   Assessment and Plan    Ascending aortic aneurysm Aneurysm stable at 41 mm. Strong family history of aortic dissection and aneurysm. Father with dissection and brother with aneurysm. Possible connective tissue disorder, no confirmed Marfan syndrome. Interested in genetic testing for familial risk assessment. - Order genetic testing for thoracic aortic aneurysm panel through [facility]. - Repeat CT scan in one year to monitor aortic size. - Schedule follow-up in one year.  Paroxysmal atrial fibrillation Status  post ablation and Watchman device with no AFib recurrence. Off anticoagulation. Antibiotic prophylaxis for dental work post-Watchman device unnecessary after six months. - Continue metoprolol  tartrate 25 mg BID. - Monitor atrial fibrillation status with personal device.  Essential hypertension Well controlled on current medication regimen.  Hyperlipidemia Coronary calcium  LDL at goal with current statin therapy. Coronary calcium  score indicates mild risk. - Continue rosuvastatin  5 mg three times a week. - LDL goal <100              Follow-up: Return in about 1 year (around 09/25/2025).   Signed, Darryle DASEN. Barbaraann, MD, [facility]  [facility]  [address] [place], [place] [address] [phone]  3:38 PM

## 2024-09-26 NOTE — Telephone Encounter (Signed)
   Patient Name: Patrick Moyer  DOB: 05/10/1951 MRN: [id number]  Primary Cardiologist: Darryle ONEIDA Decent, MD  Chart reviewed as part of pre-operative protocol coverage. Given past medical history and time since last visit, based on ACC/AHA guidelines, Patrick Moyer is at acceptable risk for the planned procedure without further cardiovascular testing.   The patient was advised that if Patrick Moyer develops new symptoms prior to surgery to contact our office to arrange for a follow-up visit, and Patrick Moyer verbalized understanding.  Per office protocol, if patient is without any new symptoms or concerns at the time of their virtual visit, he may hold ASA for 7 days prior to procedure. Please resume ASA as soon as possible postprocedure, at the discretion of the surgeon.    I will route this recommendation to the requesting party via Epic fax function and remove from pre-op pool.  Please call with questions.  Lamarr Satterfield, NP 09/26/2024, 4:32 PM

## 2024-09-26 NOTE — Telephone Encounter (Signed)
 2nd request received.  Will route to the preop pool for preop app to review

## 2024-09-26 NOTE — Telephone Encounter (Signed)
 Good Morning Dr. Barbaraann  We have received a surgical clearance request for Mr. Patrick Moyer to complete an upper endoscopy. They were seen recently in clinic on 09/25/2024. Can you please comment on surgical clearance and guidance on holding aspirin  for his upcoming procedure. Please forward you guidance and recommendations to P CV DIV PREOP   Thanks, Jackee Alberts, NP

## 2024-10-02 DIAGNOSIS — R1310 Dysphagia, unspecified: Secondary | ICD-10-CM | POA: Diagnosis not present

## 2024-10-02 DIAGNOSIS — K219 Gastro-esophageal reflux disease without esophagitis: Secondary | ICD-10-CM | POA: Diagnosis not present

## 2024-10-08 DIAGNOSIS — Z79899 Other long term (current) drug therapy: Secondary | ICD-10-CM | POA: Diagnosis not present

## 2024-10-08 DIAGNOSIS — M0609 Rheumatoid arthritis without rheumatoid factor, multiple sites: Secondary | ICD-10-CM | POA: Diagnosis not present

## 2024-10-08 DIAGNOSIS — M25552 Pain in left hip: Secondary | ICD-10-CM | POA: Diagnosis not present

## 2024-10-08 DIAGNOSIS — E7849 Other hyperlipidemia: Secondary | ICD-10-CM | POA: Diagnosis not present

## 2024-10-08 DIAGNOSIS — M1990 Unspecified osteoarthritis, unspecified site: Secondary | ICD-10-CM | POA: Diagnosis not present

## 2024-10-15 DIAGNOSIS — Q8743 Marfan's syndrome with skeletal manifestation: Secondary | ICD-10-CM | POA: Diagnosis not present

## 2024-10-15 DIAGNOSIS — H2513 Age-related nuclear cataract, bilateral: Secondary | ICD-10-CM | POA: Diagnosis not present

## 2024-10-15 DIAGNOSIS — H21239 Degeneration of iris (pigmentary), unspecified eye: Secondary | ICD-10-CM | POA: Diagnosis not present

## 2024-11-05 DIAGNOSIS — K219 Gastro-esophageal reflux disease without esophagitis: Secondary | ICD-10-CM | POA: Diagnosis not present

## 2024-11-18 ENCOUNTER — Telehealth: Payer: Self-pay

## 2024-11-18 NOTE — Telephone Encounter (Signed)
 Attempted to call patient to check in 1 year post LAAO. Will try again later.

## 2024-11-25 NOTE — Telephone Encounter (Signed)
 Attempted to call patient again to check in 1 year post LAAO implant. Unable to leave a voicemail.

## 2024-11-25 NOTE — Telephone Encounter (Signed)
 Pt returned call  Best [phone]

## 2024-11-26 NOTE — Telephone Encounter (Signed)
 Called to check in with patient, who had LAAO on 10/18/2023. The patient reports doing well with no issues.  The patient understands to call with questions or concerns.

## 2025-01-05 ENCOUNTER — Ambulatory Visit: Admitting: Diagnostic Neuroimaging

## 2025-01-05 ENCOUNTER — Encounter: Payer: Self-pay | Admitting: Diagnostic Neuroimaging

## 2025-01-05 VITALS — BP 112/74 | HR 55 | Ht 75.0 in | Wt 199.2 lb

## 2025-01-05 DIAGNOSIS — R9089 Other abnormal findings on diagnostic imaging of central nervous system: Secondary | ICD-10-CM

## 2025-01-05 NOTE — Progress Notes (Signed)
 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     HISTORY OF PRESENT ILLNESS:   74 year old male here for evaluation of abnormal MRI brain.  Patient is a therapist, sports working in telemedicine.  He developed some tinnitus on the left side and had evaluation by ENT.  MRI of the brain was obtained which mentions some degree of atrophy and therefore patient was referred here for evaluation of possible underlying neurodegenerative causes.  Patient denies any memory or cognitive problems.  He is able to do his job well and maintain his ADLs.   REVIEW OF SYSTEMS: Full 14 system review of systems performed and negative with exception of: as per HPI.  ALLERGIES: Allergies[1]  HOME MEDICATIONS: Outpatient Medications Prior to Visit  Medication Sig Dispense Refill   acetaminophen  (TYLENOL ) 500 MG tablet Take 1,000 mg by mouth 2 (two) times daily.     amoxicillin  (AMOXIL ) 500 MG tablet Take 4 tablets (2,000 mg total) by mouth as directed. 1 hour prior to dental work including cleanings (Patient taking differently: Take 2,000 mg by mouth as directed. 1 hour prior to dental work including cleanings - as needed for dental procedures) 12 tablet 12   ascorbic acid (VITAMIN C) 500 MG tablet Take 500 mg by mouth 2 (two) times daily.     aspirin  EC 81 MG tablet Take 81 mg by mouth daily. Swallow whole.     B Complex Vitamins (B COMPLEX 100 PO) Take 1 tablet by mouth in the morning and at bedtime.     Ca Carbonate-Mag Hydroxide (ROLAIDS PO) Take 1 tablet by mouth 2 (two) times daily.     celecoxib  (CELEBREX ) 200 MG capsule Take 200 mg by mouth 2 (two) times daily.     cholecalciferol (VITAMIN D) 1000 UNITS tablet Take 1,000 Units  by mouth daily.     Coenzyme Q10 (COQ-10) 200 MG CAPS Take 200 mg by mouth every Monday, Wednesday, and Friday.     COLLAGEN PO Take 3 tablets by mouth 2 (two) times daily.     diclofenac  Sodium (VOLTAREN ) 1 % GEL Apply 4 g topically 4 (four) times daily. 100 g 0   ELDERBERRY PO Take 1 capsule by mouth daily.     Emollient (BIO-OIL EX) Apply 1 Application topically 2 (two) times daily. Apply to left knee     fexofenadine (ALLEGRA) 180 MG tablet Take 180 mg by mouth daily.     FIBER PO Take 2 tablets by mouth daily.     fluticasone  (FLONASE ) 50 MCG/ACT nasal spray Place 3-4 sprays into both nostrils in the morning and at bedtime.     folic acid (FOLVITE) 1 MG tablet Take 1 mg by mouth daily.     Glucosamine HCl (GLUCOSAMINE PO) Take 1 tablet by mouth at bedtime.     Guaifenesin (MUCINEX MAXIMUM STRENGTH) 1200 MG TB12 Take 1,200 mg by mouth daily.     Hypromellose (GENTEAL OP) Place 1 drop into both eyes at bedtime.     L-Lysine 500 MG CAPS Take 500-1,000 mg by mouth See admin instructions. Take 1000 mg in the morning and 500 mg at night     Lutein -Zeaxanthin 25-5 MG CAPS Take 1 tablet by mouth  daily. 90 capsule 0   Magnesium  400 MG TABS Take 400 mg by mouth at bedtime.     methotrexate (RHEUMATREX) 2.5 MG tablet Take 12.5 mg by mouth once a week. Caution:Chemotherapy. Protect from light.     metoprolol  tartrate (LOPRESSOR ) 25 MG tablet Take 1 tablet (25 mg total) by mouth 2 (two) times daily. (Patient taking differently: Take 25 mg by mouth See admin instructions.  may take a 25 mg dose as needed for palpitations- pt is taking BID) 180 tablet 1   montelukast  (SINGULAIR ) 10 MG tablet Take 10 mg by mouth at bedtime.     Omega-3 Fatty Acids (FISH OIL ) 1000 MG CAPS Take 1,000 mg by mouth at bedtime.     OVER THE COUNTER MEDICATION Take 1 tablet by mouth 2 (two) times daily. Juvenon Heart, Brain, and Cellular health supplement     OVER THE COUNTER MEDICATION Take 3 tablets by mouth daily. Juvenon  BloodFlow-7 supplement     pantoprazole  (PROTONIX ) 40 MG tablet TAKE 1 TABLET BY MOUTH ONCE DAILY *DO NOT CRUSH OR CHEW* 90 tablet 0   RESTASIS 0.05 % ophthalmic emulsion Place 1 drop into both eyes 2 (two) times daily.     rosuvastatin  (CRESTOR ) 5 MG tablet Take 1 tablet (5 mg total) by mouth 3 (three) times a week. 45 tablet 3   SODIUM FLUORIDE 5000 PPM 1.1 % GEL dental gel Take 1 application  by mouth 2 (two) times daily.     TART CHERRY PO Take 3,000 mg by mouth every evening.     tirzepatide (MOUNJARO) 2.5 MG/0.5ML Pen Inject 2.5 mg into the skin 8 days. Every eight (8) days     triamterene-hydrochlorothiazide (DYAZIDE) 37.5-25 MG capsule Take 1 capsule by mouth daily.     Turmeric 500 MG CAPS Take 500 mg by mouth 2 (two) times daily.     valACYclovir (VALTREX) 1000 MG tablet Take 2,000 mg by mouth 2 (two) times daily as needed (cold sore).     Xylitol (XYLIMELTS MT) Use as directed 1 tablet in the mouth or throat at bedtime.     gabapentin  (NEURONTIN ) 300 MG capsule Take 150 mg by mouth at bedtime. Take 1/2 tab at bedtime     No facility-administered medications prior to visit.    PAST MEDICAL HISTORY: 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 32   per pt treated with INH for one year and cxr's since have been normal   HSV-1 infection    Marfanoid habitus    per pt has wing span is longer than his height, effects eyes (iris), hyperflexibility of ankle and hands,  AAA   Mild CAD     cardiologist--  dr maranda---  CTA 10-22-2019   Non-celiac gluten sensitivity    Numbness and tingling in both hands    per pt intermittantly due to cervical spine   OSA on CPAP    auto  8-10,  pt stated uses every night   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)   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 Cindie   Pulmonary nodules    per pt followed by pcp (CTA and CT chest 10-22-2019 , stable)   Thoracic ascending aortic aneurysm    cardiologist--- dr lois moose--- CTA 10-22-2019  ascending aorta 40mm and aortic root 43mm   Wears glasses     PAST SURGICAL HISTORY: Past Surgical History:  Procedure Laterality Date   ATRIAL FIBRILLATION ABLATION N/A 10/19/2023   Procedure: ATRIAL FIBRILLATION ABLATION;  Surgeon: Cindie Ole DASEN, MD;  Location: [facility];  Service: Cardiovascular;  Laterality: N/A;   FINGER EXPLORATION Left 1995   left middle finger digital nerve repair   INGUINAL HERNIA REPAIR Left 1978   LAPAROSCOPIC APPENDECTOMY  2010   LEFT ATRIAL APPENDAGE OCCLUSION N/A 01/17/2024   Procedure: LEFT ATRIAL APPENDAGE OCCLUSION;  Surgeon: Cindie Ole DASEN, MD;  Location: [facility];  Service: Cardiovascular;  Laterality: N/A;   left knee arthroscopy      NASAL SEPTUM SURGERY  1975   TOTAL KNEE ARTHROPLASTY Left 08/29/2022   Procedure: UNICOMPARTMENTAL KNEE converted to a total knee;  Surgeon: Josefina Chew, MD;  Location: [facility];  Service: Orthopedics;  Laterality: Left;  with block   TRANSESOPHAGEAL ECHOCARDIOGRAM (CATH LAB) N/A 01/17/2024   Procedure: TRANSESOPHAGEAL ECHOCARDIOGRAM;  Surgeon: Cindie Ole DASEN, MD;  Location: [facility];  Service: Cardiovascular;  Laterality: N/A;   TRANSURETHRAL RESECTION OF PROSTATE N/A 05/05/2020   Procedure: TRANSURETHRAL RESECTION OF THE PROSTATE (TURP), BIPOLAR;  Surgeon: Devere Lonni Righter, MD;  Location: [facility];  Service: Urology;  Laterality: N/A;   WISDOM TOOTH EXTRACTION  1975    FAMILY HISTORY: Family History  Problem Relation Age of Onset   Seizures Mother    Ovarian cancer Mother        cysto adenocarcenoma   Aortic aneurysm Father        abdominal   Hypertension Father    CAD Father    Osteoarthritis Sister    Glaucoma Brother    Bipolar disorder Brother    Marfan syndrome Brother    Obesity Brother    Seizures Maternal Grandmother    Stroke Other    Migraines Other     SOCIAL HISTORY: Social History   Socioeconomic History   Marital status: Married    Spouse name: Not on file   Number of children: 2   Years of education: Not on file   Highest education level: Not on file  Occupational History   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    Employer: [employer]  Tobacco Use   Smoking status: Never   Smokeless tobacco: Never  Vaping Use   Vaping status: Never Used  Substance and Sexual Activity   Alcohol use: No   Drug use: Never   Sexual activity: Not on file  Other Topics Concern   Not on file  Social History Narrative   No caffeine - hurts his stomach    Social Drivers of Health   Tobacco Use: Low Risk (01/05/2025)   Patient History    Smoking Tobacco Use: Never    Smokeless Tobacco Use: Never    Passive Exposure: Not on file  Financial Resource Strain: Not on file  Food Insecurity: No Food Insecurity (01/17/2024)  Hunger Vital Sign    Worried About Running Out of Food in the Last Year: Never true    Ran Out of Food in the Last Year: Never true  Transportation Needs: No Transportation Needs (01/17/2024)   PRAPARE - Administrator, Civil Service (Medical): No    Lack of Transportation (Non-Medical): No  Physical Activity: Not on file  Stress: Not on file  Social Connections: Unknown (01/17/2024)   Social Connection and Isolation Panel    Frequency of Communication with Friends and Family: Patient  declined    Frequency of Social Gatherings with Friends and Family: Patient declined    Attends Religious Services: Not on Marketing Executive or Organizations: Not on file    Attends Banker Meetings: Not on file    Marital Status: Not on file  Intimate Partner Violence: Unknown (01/17/2024)   Humiliation, Afraid, Rape, and Kick questionnaire    Fear of Current or Ex-Partner: No    Emotionally Abused: No    Physically Abused: Not on file    Sexually Abused: No  Depression (PHQ2-9): Low Risk (01/18/2022)   Depression (PHQ2-9)    PHQ-2 Score: 0  Alcohol Screen: Not on file  Housing: Unknown (01/17/2024)   Housing Stability Vital Sign    Unable to Pay for Housing in the Last Year: No    Number of Times Moved in the Last Year: Not on file    Homeless in the Last Year: Not on file  Utilities: Not At Risk (01/17/2024)   AHC Utilities    Threatened with loss of utilities: No  Health Literacy: Not on file     PHYSICAL EXAM  GENERAL EXAM/CONSTITUTIONAL: Vitals:  Vitals:   01/05/25 0925  BP: 112/74  Pulse: (!) 55  SpO2: 99%  Weight: 199 lb 3.2 oz (90.4 kg)  Height: 6' 3 (1.905 m)   Body mass index is 24.9 kg/m. Wt Readings from Last 3 Encounters:  01/05/25 199 lb 3.2 oz (90.4 kg)  09/25/24 200 lb (90.7 kg)  06/05/24 201 lb 6.4 oz (91.4 kg)   Patient is in no distress; well developed, nourished and groomed; neck is supple  CARDIOVASCULAR: Examination of carotid arteries is normal; no carotid bruits Regular rate and rhythm, no murmurs Examination of peripheral vascular system by observation and palpation is normal  EYES: Ophthalmoscopic exam of optic discs and posterior segments is normal; no papilledema or hemorrhages No results found.  MUSCULOSKELETAL: Gait, strength, tone, movements noted in Neurologic exam below  NEUROLOGIC: MENTAL STATUS:      No data to display         awake, alert, oriented to person, place and time recent and  remote memory intact normal attention and concentration language fluent, comprehension intact, naming intact fund of knowledge appropriate  CRANIAL NERVE:  2nd - no papilledema on fundoscopic exam 2nd, 3rd, 4th, 6th - pupils equal and reactive to light, visual fields full to confrontation, extraocular muscles intact, no nystagmus 5th - facial sensation symmetric 7th - facial strength --> SLIGHTLY DECR LEFT EYE BROW RAISE AND LEFT EYE CLOSURE STRENGTH 8th - hearing intact 9th - palate elevates symmetrically, uvula midline 11th - shoulder shrug symmetric 12th - tongue protrusion midline  MOTOR:  normal bulk and tone, full strength in the BUE, BLE  SENSORY:  normal and symmetric to light touch, temperature, vibration; SLIGHTLY DECR IN LEFT FOOT / TOES  COORDINATION:  finger-nose-finger, fine finger movements normal  REFLEXES:  deep tendon reflexes present and symmetric; EXCEPT DECR IN LEFT PATELLA, BILATERAL ANKLES  GAIT/STATION:  narrow based gait    DIAGNOSTIC DATA (LABS, IMAGING, TESTING) - I reviewed patient records, labs, notes, testing and imaging myself where available.  Lab Results  Component Value Date   WBC 7.8 01/29/2024   HGB 14.4 01/29/2024   HCT 41.2 01/29/2024   MCV 92.4 01/29/2024   PLT 209 01/29/2024      Component Value Date/Time   NA 140 01/29/2024 1754   NA 144 01/04/2024 1123   K 4.0 01/29/2024 1754   CL 106 01/29/2024 1754   CO2 27 01/29/2024 1754   GLUCOSE 90 01/29/2024 1754   BUN 22 01/29/2024 1754   BUN 21 01/04/2024 1123   CREATININE 0.71 01/29/2024 1754   CALCIUM  9.4 01/29/2024 1754   PROT 6.4 11/22/2022 1329   ALBUMIN 4.3 11/22/2022 1329   AST 21 11/22/2022 1329   ALT 20 11/22/2022 1329   ALKPHOS 75 11/22/2022 1329   BILITOT 0.4 11/22/2022 1329   GFRNONAA >60 01/29/2024 1754   GFRAA 112 12/27/2020 1216   Lab Results  Component Value Date   CHOL 132 01/04/2024   HDL 42 01/04/2024   LDLCALC 76 01/04/2024   TRIG 66 01/04/2024    CHOLHDL 3.1 01/04/2024   No results found for: HGBA1C No results found for: VITAMINB12 Lab Results  Component Value Date   TSH 1.500 11/22/2022    02/28/24 MRI brain [I reviewed images myself. Minimal atrophy, likely normal for age. No significant change from 2017. -VRP]  1. No cerebellopontine angle or internal auditory canal mass. 2. No etiology of hearing loss is identified. 3. Minimal chronic small vessel ischemic changes within the cerebral white matter. 4. Mild-to-moderate generalized cerebral atrophy. 5. Paranasal sinus disease as described.    ASSESSMENT AND PLAN  74 y.o. year old adult here with MRI brain report mentioning cerebral atrophy.  I reviewed imaging and do not appreciate any significant atrophy nor any significant change from 2017.  Likely within normal limits.   Dx:  1. Abnormal finding on MRI of brain      PLAN:  MRI FINDINGS - reviewed MRI imaging; no significant atrophy noted; no major memory or cognitive symptoms; reassured patient  Return for return to PCP, pending if symptoms worsen or fail to improve.    EDUARD FABIENE HANLON, MD 01/05/2025, 1:20 PM Certified in Neurology, Neurophysiology and Neuroimaging  [facility] [address] [place], [place] [address] [phone]     [1]  Allergies Allergen Reactions   Caffeine     Abdominal pain   Cialis [Tadalafil]     Caused acute angle glaucoma   Crestor  [Rosuvastatin ] Other (See Comments)    Causes joint aches, tolerates if not taking everyday   Oxycodone      Redness/ flushing to the face   Soy Protein [Soybean Oil]     Gastrointestinal upset   Viagra [Sildenafil]     Caused acute angle glaucoma   Latex Rash    rash   Tape Rash   "
